# Patient Record
Sex: Female | Born: 1945 | Race: White | Hispanic: No | Smoking: Former smoker
Health system: Southern US, Community
[De-identification: ages and names within clinical notes are randomized; demographics above are authoritative.]

## PROBLEM LIST (undated history)

## (undated) DIAGNOSIS — H269 Unspecified cataract: Secondary | ICD-10-CM

## (undated) DIAGNOSIS — C4490 Unspecified malignant neoplasm of skin, unspecified: Secondary | ICD-10-CM

## (undated) DIAGNOSIS — K5790 Diverticulosis of intestine, part unspecified, without perforation or abscess without bleeding: Secondary | ICD-10-CM

## (undated) DIAGNOSIS — G4730 Sleep apnea, unspecified: Secondary | ICD-10-CM

## (undated) DIAGNOSIS — I4891 Unspecified atrial fibrillation: Secondary | ICD-10-CM

## (undated) DIAGNOSIS — Z972 Presence of dental prosthetic device (complete) (partial): Secondary | ICD-10-CM

## (undated) DIAGNOSIS — R0600 Dyspnea, unspecified: Secondary | ICD-10-CM

## (undated) DIAGNOSIS — IMO0002 Reserved for concepts with insufficient information to code with codable children: Secondary | ICD-10-CM

## (undated) DIAGNOSIS — I639 Cerebral infarction, unspecified: Secondary | ICD-10-CM

## (undated) DIAGNOSIS — E785 Hyperlipidemia, unspecified: Secondary | ICD-10-CM

## (undated) DIAGNOSIS — E039 Hypothyroidism, unspecified: Secondary | ICD-10-CM

## (undated) DIAGNOSIS — D689 Coagulation defect, unspecified: Secondary | ICD-10-CM

## (undated) DIAGNOSIS — K759 Inflammatory liver disease, unspecified: Secondary | ICD-10-CM

## (undated) DIAGNOSIS — I499 Cardiac arrhythmia, unspecified: Secondary | ICD-10-CM

## (undated) DIAGNOSIS — K219 Gastro-esophageal reflux disease without esophagitis: Secondary | ICD-10-CM

## (undated) DIAGNOSIS — M8580 Other specified disorders of bone density and structure, unspecified site: Secondary | ICD-10-CM

## (undated) DIAGNOSIS — I4892 Unspecified atrial flutter: Secondary | ICD-10-CM

## (undated) HISTORY — DX: Gastro-esophageal reflux disease without esophagitis: K21.9

## (undated) HISTORY — DX: Reserved for concepts with insufficient information to code with codable children: IMO0002

## (undated) HISTORY — DX: Coagulation defect, unspecified: D68.9

## (undated) HISTORY — DX: Hyperlipidemia, unspecified: E78.5

## (undated) HISTORY — DX: Other specified disorders of bone density and structure, unspecified site: M85.80

## (undated) HISTORY — DX: Unspecified cataract: H26.9

## (undated) HISTORY — PX: ANAL FISSURE REPAIR: SHX2312

## (undated) HISTORY — DX: Unspecified malignant neoplasm of skin, unspecified: C44.90

## (undated) HISTORY — DX: Diverticulosis of intestine, part unspecified, without perforation or abscess without bleeding: K57.90

## (undated) HISTORY — PX: ABDOMINAL HYSTERECTOMY: SHX81

## (undated) HISTORY — DX: Unspecified atrial flutter: I48.92

## (undated) HISTORY — DX: Hypothyroidism, unspecified: E03.9

## (undated) HISTORY — DX: Cerebral infarction, unspecified: I63.9

## (undated) HISTORY — PX: PARTIAL HYSTERECTOMY: SHX80

## (undated) HISTORY — DX: Unspecified atrial fibrillation: I48.91

---

## 1982-06-19 DIAGNOSIS — E039 Hypothyroidism, unspecified: Secondary | ICD-10-CM

## 1982-06-19 HISTORY — DX: Hypothyroidism, unspecified: E03.9

## 1995-06-20 DIAGNOSIS — K219 Gastro-esophageal reflux disease without esophagitis: Secondary | ICD-10-CM

## 1995-06-20 HISTORY — DX: Gastro-esophageal reflux disease without esophagitis: K21.9

## 1996-08-17 DIAGNOSIS — E785 Hyperlipidemia, unspecified: Secondary | ICD-10-CM

## 1996-08-17 HISTORY — DX: Hyperlipidemia, unspecified: E78.5

## 1997-03-19 DIAGNOSIS — K5790 Diverticulosis of intestine, part unspecified, without perforation or abscess without bleeding: Secondary | ICD-10-CM

## 1997-03-19 DIAGNOSIS — K620 Anal polyp: Secondary | ICD-10-CM | POA: Insufficient documentation

## 1997-03-19 DIAGNOSIS — K621 Rectal polyp: Secondary | ICD-10-CM

## 1997-03-19 HISTORY — DX: Diverticulosis of intestine, part unspecified, without perforation or abscess without bleeding: K57.90

## 1997-04-06 HISTORY — PX: POLYPECTOMY: SHX149

## 1997-09-14 ENCOUNTER — Other Ambulatory Visit: Admission: RE | Admit: 1997-09-14 | Discharge: 1997-09-14 | Payer: Self-pay | Admitting: Family Medicine

## 1999-11-23 ENCOUNTER — Encounter: Payer: Self-pay | Admitting: Family Medicine

## 1999-11-23 ENCOUNTER — Encounter: Admission: RE | Admit: 1999-11-23 | Discharge: 1999-11-23 | Payer: Self-pay | Admitting: Family Medicine

## 2000-10-05 ENCOUNTER — Other Ambulatory Visit: Admission: RE | Admit: 2000-10-05 | Discharge: 2000-10-05 | Payer: Self-pay | Admitting: Family Medicine

## 2000-10-11 ENCOUNTER — Encounter: Payer: Self-pay | Admitting: Family Medicine

## 2000-10-11 LAB — CONVERTED CEMR LAB: Pap Smear: NORMAL

## 2000-11-19 ENCOUNTER — Encounter: Admission: RE | Admit: 2000-11-19 | Discharge: 2000-11-19 | Payer: Self-pay | Admitting: Family Medicine

## 2000-12-04 ENCOUNTER — Encounter: Payer: Self-pay | Admitting: Family Medicine

## 2000-12-04 ENCOUNTER — Encounter: Admission: RE | Admit: 2000-12-04 | Discharge: 2000-12-04 | Payer: Self-pay | Admitting: Family Medicine

## 2001-12-10 ENCOUNTER — Encounter: Admission: RE | Admit: 2001-12-10 | Discharge: 2001-12-10 | Payer: Self-pay | Admitting: Family Medicine

## 2001-12-10 ENCOUNTER — Encounter: Payer: Self-pay | Admitting: Family Medicine

## 2002-11-14 LAB — HM DEXA SCAN

## 2002-11-18 DIAGNOSIS — M8580 Other specified disorders of bone density and structure, unspecified site: Secondary | ICD-10-CM

## 2002-11-18 HISTORY — DX: Other specified disorders of bone density and structure, unspecified site: M85.80

## 2002-12-12 ENCOUNTER — Encounter: Payer: Self-pay | Admitting: Family Medicine

## 2002-12-12 ENCOUNTER — Encounter: Admission: RE | Admit: 2002-12-12 | Discharge: 2002-12-12 | Payer: Self-pay | Admitting: Family Medicine

## 2004-01-20 ENCOUNTER — Encounter: Admission: RE | Admit: 2004-01-20 | Discharge: 2004-01-20 | Payer: Self-pay | Admitting: Family Medicine

## 2004-04-22 ENCOUNTER — Ambulatory Visit: Payer: Self-pay | Admitting: Family Medicine

## 2004-05-30 ENCOUNTER — Ambulatory Visit: Payer: Self-pay | Admitting: Family Medicine

## 2004-06-08 ENCOUNTER — Ambulatory Visit: Payer: Self-pay | Admitting: Family Medicine

## 2004-08-02 ENCOUNTER — Ambulatory Visit: Payer: Self-pay | Admitting: Family Medicine

## 2005-01-18 ENCOUNTER — Ambulatory Visit: Payer: Self-pay | Admitting: Family Medicine

## 2005-01-20 ENCOUNTER — Ambulatory Visit: Payer: Self-pay | Admitting: Family Medicine

## 2005-02-13 ENCOUNTER — Ambulatory Visit: Payer: Self-pay | Admitting: Family Medicine

## 2005-02-23 ENCOUNTER — Encounter: Admission: RE | Admit: 2005-02-23 | Discharge: 2005-02-23 | Payer: Self-pay | Admitting: Family Medicine

## 2005-10-09 ENCOUNTER — Ambulatory Visit: Payer: Self-pay | Admitting: Family Medicine

## 2005-10-19 ENCOUNTER — Ambulatory Visit: Payer: Self-pay | Admitting: Family Medicine

## 2005-12-11 ENCOUNTER — Ambulatory Visit: Payer: Self-pay | Admitting: Family Medicine

## 2005-12-12 ENCOUNTER — Ambulatory Visit: Payer: Self-pay | Admitting: Family Medicine

## 2005-12-13 ENCOUNTER — Ambulatory Visit: Payer: Self-pay | Admitting: Family Medicine

## 2005-12-14 ENCOUNTER — Ambulatory Visit: Payer: Self-pay | Admitting: Family Medicine

## 2005-12-15 ENCOUNTER — Ambulatory Visit: Payer: Self-pay | Admitting: Family Medicine

## 2005-12-18 ENCOUNTER — Ambulatory Visit: Payer: Self-pay | Admitting: Family Medicine

## 2005-12-27 ENCOUNTER — Ambulatory Visit: Payer: Self-pay | Admitting: Family Medicine

## 2006-03-13 ENCOUNTER — Ambulatory Visit: Payer: Self-pay | Admitting: Internal Medicine

## 2006-04-02 ENCOUNTER — Ambulatory Visit: Payer: Self-pay | Admitting: Family Medicine

## 2006-04-04 ENCOUNTER — Ambulatory Visit: Payer: Self-pay | Admitting: Family Medicine

## 2006-04-19 LAB — FECAL OCCULT BLOOD, GUAIAC: Fecal Occult Blood: POSITIVE

## 2006-05-08 ENCOUNTER — Ambulatory Visit: Payer: Self-pay | Admitting: Family Medicine

## 2006-05-16 ENCOUNTER — Ambulatory Visit: Payer: Self-pay | Admitting: Family Medicine

## 2006-06-01 ENCOUNTER — Ambulatory Visit: Payer: Self-pay | Admitting: Family Medicine

## 2006-06-04 ENCOUNTER — Ambulatory Visit: Payer: Self-pay | Admitting: Family Medicine

## 2006-06-05 ENCOUNTER — Ambulatory Visit: Payer: Self-pay | Admitting: Internal Medicine

## 2006-06-21 ENCOUNTER — Ambulatory Visit: Payer: Self-pay | Admitting: Family Medicine

## 2006-07-05 ENCOUNTER — Ambulatory Visit: Payer: Self-pay | Admitting: Internal Medicine

## 2006-07-05 LAB — HM COLONOSCOPY

## 2006-07-20 HISTORY — PX: CHOLECYSTECTOMY: SHX55

## 2006-08-03 ENCOUNTER — Ambulatory Visit (HOSPITAL_COMMUNITY): Admission: RE | Admit: 2006-08-03 | Discharge: 2006-08-04 | Payer: Self-pay | Admitting: General Surgery

## 2006-08-03 ENCOUNTER — Encounter (INDEPENDENT_AMBULATORY_CARE_PROVIDER_SITE_OTHER): Payer: Self-pay | Admitting: *Deleted

## 2006-09-07 ENCOUNTER — Encounter: Payer: Self-pay | Admitting: Family Medicine

## 2006-11-30 ENCOUNTER — Encounter (INDEPENDENT_AMBULATORY_CARE_PROVIDER_SITE_OTHER): Payer: Self-pay | Admitting: *Deleted

## 2006-12-04 ENCOUNTER — Ambulatory Visit: Payer: Self-pay | Admitting: Family Medicine

## 2006-12-05 ENCOUNTER — Encounter: Payer: Self-pay | Admitting: Family Medicine

## 2006-12-05 HISTORY — PX: MOHS SURGERY: SUR867

## 2006-12-15 DIAGNOSIS — K219 Gastro-esophageal reflux disease without esophagitis: Secondary | ICD-10-CM | POA: Insufficient documentation

## 2006-12-15 DIAGNOSIS — M8580 Other specified disorders of bone density and structure, unspecified site: Secondary | ICD-10-CM | POA: Insufficient documentation

## 2006-12-15 DIAGNOSIS — E785 Hyperlipidemia, unspecified: Secondary | ICD-10-CM

## 2006-12-15 DIAGNOSIS — K5730 Diverticulosis of large intestine without perforation or abscess without bleeding: Secondary | ICD-10-CM | POA: Insufficient documentation

## 2006-12-15 DIAGNOSIS — G43909 Migraine, unspecified, not intractable, without status migrainosus: Secondary | ICD-10-CM | POA: Insufficient documentation

## 2006-12-15 DIAGNOSIS — E039 Hypothyroidism, unspecified: Secondary | ICD-10-CM

## 2006-12-15 DIAGNOSIS — H409 Unspecified glaucoma: Secondary | ICD-10-CM | POA: Insufficient documentation

## 2006-12-15 DIAGNOSIS — M810 Age-related osteoporosis without current pathological fracture: Secondary | ICD-10-CM

## 2007-02-02 ENCOUNTER — Encounter: Payer: Self-pay | Admitting: Family Medicine

## 2007-02-02 ENCOUNTER — Ambulatory Visit: Payer: Self-pay | Admitting: Family Medicine

## 2007-02-04 ENCOUNTER — Ambulatory Visit: Payer: Self-pay | Admitting: Family Medicine

## 2007-02-05 ENCOUNTER — Encounter: Payer: Self-pay | Admitting: Family Medicine

## 2007-02-06 ENCOUNTER — Telehealth (INDEPENDENT_AMBULATORY_CARE_PROVIDER_SITE_OTHER): Payer: Self-pay | Admitting: *Deleted

## 2007-04-03 ENCOUNTER — Ambulatory Visit: Payer: Self-pay | Admitting: Family Medicine

## 2007-04-03 LAB — CONVERTED CEMR LAB
ALT: 22 units/L (ref 0–35)
AST: 23 units/L (ref 0–37)
BUN: 6 mg/dL (ref 6–23)
Basophils Absolute: 0 10*3/uL (ref 0.0–0.1)
Basophils Relative: 0.7 % (ref 0.0–1.0)
CO2: 32 meq/L (ref 19–32)
Calcium: 9.2 mg/dL (ref 8.4–10.5)
Chloride: 105 meq/L (ref 96–112)
Cholesterol: 172 mg/dL (ref 0–200)
Creatinine, Ser: 0.6 mg/dL (ref 0.4–1.2)
Direct LDL: 104.8 mg/dL
Eosinophils Absolute: 0.2 10*3/uL (ref 0.0–0.6)
Eosinophils Relative: 4.3 % (ref 0.0–5.0)
Free T4: 0.9 ng/dL (ref 0.6–1.6)
GFR calc Af Amer: 131 mL/min
GFR calc non Af Amer: 108 mL/min
Glucose, Bld: 97 mg/dL (ref 70–99)
HCT: 39.8 % (ref 36.0–46.0)
HDL: 29.6 mg/dL — ABNORMAL LOW (ref >39.0)
Hemoglobin: 13.7 g/dL (ref 12.0–15.0)
Lymphocytes Relative: 41.7 % (ref 12.0–46.0)
MCHC: 34.4 g/dL (ref 30.0–36.0)
MCV: 83.6 fL (ref 78.0–100.0)
Monocytes Absolute: 0.4 10*3/uL (ref 0.2–0.7)
Monocytes Relative: 7.9 % (ref 3.0–11.0)
Neutro Abs: 2.1 10*3/uL (ref 1.4–7.7)
Neutrophils Relative %: 45.4 % (ref 43.0–77.0)
Platelets: 167 10*3/uL (ref 150–400)
Potassium: 4 meq/L (ref 3.5–5.1)
RBC: 4.76 M/uL (ref 3.87–5.11)
RDW: 13.5 % (ref 11.5–14.6)
Sodium: 142 meq/L (ref 135–145)
TSH: 6.75 microintl units/mL — ABNORMAL HIGH (ref 0.35–5.50)
Total CHOL/HDL Ratio: 5.8
Triglycerides: 226 mg/dL (ref 0–149)
VLDL: 45 mg/dL — ABNORMAL HIGH (ref 0–40)
WBC: 4.7 10*3/uL (ref 4.5–10.5)

## 2007-04-08 ENCOUNTER — Ambulatory Visit: Payer: Self-pay | Admitting: Family Medicine

## 2007-05-02 ENCOUNTER — Ambulatory Visit: Payer: Self-pay | Admitting: Family Medicine

## 2007-05-14 ENCOUNTER — Encounter: Payer: Self-pay | Admitting: Family Medicine

## 2007-05-14 ENCOUNTER — Ambulatory Visit: Payer: Self-pay | Admitting: Family Medicine

## 2007-05-20 ENCOUNTER — Encounter (INDEPENDENT_AMBULATORY_CARE_PROVIDER_SITE_OTHER): Payer: Self-pay | Admitting: *Deleted

## 2007-05-21 ENCOUNTER — Telehealth: Payer: Self-pay | Admitting: Family Medicine

## 2007-06-21 ENCOUNTER — Telehealth (INDEPENDENT_AMBULATORY_CARE_PROVIDER_SITE_OTHER): Payer: Self-pay | Admitting: *Deleted

## 2007-08-02 ENCOUNTER — Encounter: Payer: Self-pay | Admitting: Family Medicine

## 2007-08-02 HISTORY — PX: OTHER SURGICAL HISTORY: SHX169

## 2007-10-03 ENCOUNTER — Ambulatory Visit: Payer: Self-pay | Admitting: Family Medicine

## 2007-10-23 ENCOUNTER — Telehealth: Payer: Self-pay | Admitting: Family Medicine

## 2008-02-17 ENCOUNTER — Telehealth (INDEPENDENT_AMBULATORY_CARE_PROVIDER_SITE_OTHER): Payer: Self-pay | Admitting: Internal Medicine

## 2008-03-18 ENCOUNTER — Telehealth: Payer: Self-pay | Admitting: Family Medicine

## 2008-05-07 ENCOUNTER — Ambulatory Visit: Payer: Self-pay | Admitting: Family Medicine

## 2008-05-07 LAB — CONVERTED CEMR LAB
ALT: 22 units/L (ref 0–35)
Albumin: 3.6 g/dL (ref 3.5–5.2)
BUN: 11 mg/dL (ref 6–23)
Basophils Relative: 1 % (ref 0.0–3.0)
Bilirubin, Direct: 0.1 mg/dL (ref 0.0–0.3)
CO2: 29 meq/L (ref 19–32)
Calcium: 9.5 mg/dL (ref 8.4–10.5)
Creatinine, Ser: 0.7 mg/dL (ref 0.4–1.2)
Eosinophils Relative: 4 % (ref 0.0–5.0)
GFR calc Af Amer: 109 mL/min
Glucose, Bld: 104 mg/dL — ABNORMAL HIGH (ref 70–99)
HCT: 41.9 % (ref 36.0–46.0)
Hemoglobin: 14.3 g/dL (ref 12.0–15.0)
LDL Cholesterol: 103 mg/dL — ABNORMAL HIGH (ref 0–99)
Lymphocytes Relative: 36.5 % (ref 12.0–46.0)
Monocytes Absolute: 0.5 10*3/uL (ref 0.1–1.0)
Monocytes Relative: 8.5 % (ref 3.0–12.0)
Neutro Abs: 2.9 10*3/uL (ref 1.4–7.7)
RBC: 4.85 M/uL (ref 3.87–5.11)
RDW: 12.9 % (ref 11.5–14.6)
TSH: 1.59 microintl units/mL (ref 0.35–5.50)
Total CHOL/HDL Ratio: 4.4
Total Protein: 6.5 g/dL (ref 6.0–8.3)
Triglycerides: 180 mg/dL — ABNORMAL HIGH (ref 0–149)
WBC: 5.9 10*3/uL (ref 4.5–10.5)

## 2008-05-09 LAB — CONVERTED CEMR LAB: Vit D, 1,25-Dihydroxy: 7 — ABNORMAL LOW (ref 30–89)

## 2008-05-12 ENCOUNTER — Ambulatory Visit: Payer: Self-pay | Admitting: Family Medicine

## 2008-07-02 ENCOUNTER — Telehealth: Payer: Self-pay | Admitting: Family Medicine

## 2008-08-05 ENCOUNTER — Ambulatory Visit: Payer: Self-pay | Admitting: Family Medicine

## 2008-08-10 ENCOUNTER — Ambulatory Visit: Payer: Self-pay | Admitting: Family Medicine

## 2008-08-10 DIAGNOSIS — I8390 Asymptomatic varicose veins of unspecified lower extremity: Secondary | ICD-10-CM | POA: Insufficient documentation

## 2008-08-19 ENCOUNTER — Ambulatory Visit: Payer: Self-pay | Admitting: Family Medicine

## 2008-08-19 ENCOUNTER — Encounter: Payer: Self-pay | Admitting: Family Medicine

## 2008-08-24 ENCOUNTER — Encounter (INDEPENDENT_AMBULATORY_CARE_PROVIDER_SITE_OTHER): Payer: Self-pay | Admitting: *Deleted

## 2008-08-24 ENCOUNTER — Encounter: Payer: Self-pay | Admitting: Family Medicine

## 2008-09-04 ENCOUNTER — Encounter: Payer: Self-pay | Admitting: Family Medicine

## 2008-09-17 HISTORY — PX: OTHER SURGICAL HISTORY: SHX169

## 2008-10-07 ENCOUNTER — Ambulatory Visit: Payer: Self-pay | Admitting: Family Medicine

## 2008-10-12 ENCOUNTER — Ambulatory Visit: Payer: Self-pay | Admitting: Family Medicine

## 2008-10-16 ENCOUNTER — Encounter: Payer: Self-pay | Admitting: Family Medicine

## 2008-12-14 ENCOUNTER — Telehealth: Payer: Self-pay | Admitting: Family Medicine

## 2008-12-23 ENCOUNTER — Telehealth: Payer: Self-pay | Admitting: Family Medicine

## 2009-03-22 ENCOUNTER — Telehealth: Payer: Self-pay | Admitting: Family Medicine

## 2009-04-07 ENCOUNTER — Ambulatory Visit: Payer: Self-pay | Admitting: Family Medicine

## 2009-04-07 LAB — CONVERTED CEMR LAB
Bilirubin Urine: NEGATIVE
Epithelial cells, urine: 0 /lpf
Nitrite: NEGATIVE
RBC / HPF: 0
Specific Gravity, Urine: >=1.03
WBC Urine, dipstick: NEGATIVE

## 2009-05-17 ENCOUNTER — Ambulatory Visit: Payer: Self-pay | Admitting: Family Medicine

## 2009-05-19 ENCOUNTER — Other Ambulatory Visit: Admission: RE | Admit: 2009-05-19 | Discharge: 2009-05-19 | Payer: Self-pay | Admitting: Family Medicine

## 2009-05-19 ENCOUNTER — Ambulatory Visit: Payer: Self-pay | Admitting: Family Medicine

## 2009-05-19 LAB — CONVERTED CEMR LAB: Pap Smear: NORMAL

## 2009-05-27 ENCOUNTER — Encounter (INDEPENDENT_AMBULATORY_CARE_PROVIDER_SITE_OTHER): Payer: Self-pay | Admitting: *Deleted

## 2009-05-27 ENCOUNTER — Encounter: Payer: Self-pay | Admitting: Family Medicine

## 2009-07-23 ENCOUNTER — Telehealth: Payer: Self-pay | Admitting: Family Medicine

## 2009-07-26 ENCOUNTER — Ambulatory Visit: Payer: Self-pay | Admitting: Family Medicine

## 2009-07-29 ENCOUNTER — Ambulatory Visit: Payer: Self-pay | Admitting: Family Medicine

## 2009-08-02 ENCOUNTER — Telehealth: Payer: Self-pay | Admitting: Family Medicine

## 2009-08-03 ENCOUNTER — Encounter: Payer: Self-pay | Admitting: Family Medicine

## 2009-08-24 ENCOUNTER — Encounter: Payer: Self-pay | Admitting: Family Medicine

## 2009-08-30 ENCOUNTER — Telehealth: Payer: Self-pay | Admitting: Family Medicine

## 2009-09-06 ENCOUNTER — Ambulatory Visit: Payer: Self-pay | Admitting: Family Medicine

## 2009-09-07 LAB — CONVERTED CEMR LAB: Vit D, 25-Hydroxy: 30 ng/mL (ref 30–89)

## 2009-09-15 ENCOUNTER — Ambulatory Visit: Payer: Self-pay | Admitting: Family Medicine

## 2009-10-14 ENCOUNTER — Ambulatory Visit: Payer: Self-pay | Admitting: Family Medicine

## 2009-10-15 ENCOUNTER — Ambulatory Visit: Payer: Self-pay | Admitting: Family Medicine

## 2009-10-15 ENCOUNTER — Encounter: Payer: Self-pay | Admitting: Family Medicine

## 2009-10-15 LAB — HM MAMMOGRAPHY: HM Mammogram: NORMAL

## 2009-10-25 ENCOUNTER — Encounter (INDEPENDENT_AMBULATORY_CARE_PROVIDER_SITE_OTHER): Payer: Self-pay | Admitting: *Deleted

## 2010-01-25 ENCOUNTER — Encounter (INDEPENDENT_AMBULATORY_CARE_PROVIDER_SITE_OTHER): Payer: Self-pay | Admitting: *Deleted

## 2010-04-13 ENCOUNTER — Ambulatory Visit: Payer: Self-pay | Admitting: Family Medicine

## 2010-06-07 ENCOUNTER — Ambulatory Visit: Payer: Self-pay | Admitting: Family Medicine

## 2010-06-07 ENCOUNTER — Encounter: Payer: Self-pay | Admitting: Family Medicine

## 2010-06-07 LAB — CONVERTED CEMR LAB
AST: 25 units/L (ref 0–37)
Albumin: 3.8 g/dL (ref 3.5–5.2)
Alkaline Phosphatase: 70 units/L (ref 39–117)
BUN: 15 mg/dL (ref 6–23)
Basophils Relative: 0.8 % (ref 0.0–3.0)
CO2: 31 meq/L (ref 19–32)
Calcium: 9.2 mg/dL (ref 8.4–10.5)
Eosinophils Relative: 2.6 % (ref 0.0–5.0)
GFR calc non Af Amer: 86.72 mL/min (ref >60.00)
Glucose, Bld: 92 mg/dL (ref 70–99)
HCT: 39.5 % (ref 36.0–46.0)
Hemoglobin: 13.4 g/dL (ref 12.0–15.0)
Lymphs Abs: 2.1 10*3/uL (ref 0.7–4.0)
MCV: 85.2 fL (ref 78.0–100.0)
Monocytes Absolute: 0.4 10*3/uL (ref 0.1–1.0)
Monocytes Relative: 8.2 % (ref 3.0–12.0)
RBC: 4.63 M/uL (ref 3.87–5.11)
TSH: 20.05 microintl units/mL — ABNORMAL HIGH (ref 0.35–5.50)
Total CHOL/HDL Ratio: 5
Total Protein: 6.5 g/dL (ref 6.0–8.3)
WBC: 5.4 10*3/uL (ref 4.5–10.5)

## 2010-06-15 ENCOUNTER — Ambulatory Visit: Payer: Self-pay | Admitting: Family Medicine

## 2010-07-17 LAB — CONVERTED CEMR LAB
ALT: 21 units/L (ref 0–35)
AST: 23 units/L (ref 0–37)
Albumin: 3.7 g/dL (ref 3.5–5.2)
Calcium: 9.1 mg/dL (ref 8.4–10.5)
Chloride: 103 meq/L (ref 96–112)
Cholesterol: 199 mg/dL (ref 0–200)
Creatinine, Ser: 0.9 mg/dL (ref 0.4–1.2)
Direct LDL: 125.3 mg/dL
Eosinophils Relative: 2.9 % (ref 0.0–5.0)
Free T4: 1.2 ng/dL (ref 0.6–1.6)
HDL: 37.7 mg/dL — ABNORMAL LOW (ref >39.00)
Lymphocytes Relative: 37 % (ref 12.0–46.0)
Monocytes Absolute: 0.5 10*3/uL (ref 0.1–1.0)
Monocytes Relative: 8.8 % (ref 3.0–12.0)
Neutrophils Relative %: 50.3 % (ref 43.0–77.0)
Platelets: 165 10*3/uL (ref 150.0–400.0)
RBC: 4.79 M/uL (ref 3.87–5.11)
Sodium: 140 meq/L (ref 135–145)
TSH: 0.68 microintl units/mL (ref 0.35–5.50)
Total Protein: 6.9 g/dL (ref 6.0–8.3)
Triglycerides: 227 mg/dL — ABNORMAL HIGH (ref 0.0–149.0)
Vit D, 25-Hydroxy: 15 ng/mL — ABNORMAL LOW (ref 30–89)
WBC: 5.9 10*3/uL (ref 4.5–10.5)

## 2010-07-19 NOTE — Assessment & Plan Note (Signed)
Summary: FLU SHOT/CLE   Nurse Visit   Allergies: 1)  Celebrex (Celecoxib)  Orders Added: 1)  Admin 1st Vaccine [90471] 2)  Flu Vaccine 57yrs + [27253]  Flu Vaccine Consent Questions     Do you have a history of severe allergic reactions to this vaccine? no    Any prior history of allergic reactions to egg and/or gelatin? no    Do you have a sensitivity to the preservative Thimersol? no    Do you have a past history of Guillan-Barre Syndrome? no    Do you currently have an acute febrile illness? no    Have you ever had a severe reaction to latex? no    Vaccine information given and explained to patient? yes    Are you currently pregnant? no    Lot Number:[id number]   Exp Date:12/17/2010   Site Given  Left Deltoid IM1

## 2010-07-19 NOTE — Letter (Signed)
Summary: Kara Wright letter  [place] at [facility]  [address] [place], [place] [address]   Phone: [phone]  Fax: [phone]       01/25/2010 MRN: [id number]  [facility] [address] [place], [place]  [address]  Dear Ms. [facility] [place] and [place] announce the retirement of Arta Silence, M.D., from full-time practice at the [facility] office effective December 16, 2009 and his plans of returning part-time.  It is important to Dr. Hetty Ely and to our practice that you understand that [facility] has seven physicians in our office for your health care needs.  We will continue to offer the same exceptional care that you have today.    Dr. Hetty Ely has spoken to many of you about his plans for retirement and returning part-time in the fall.   We will continue to work with you through the transition to schedule appointments for you in the office and meet the high standards that [place] is committed to.   Again, it is with great pleasure that we share the news that Dr. Hetty Ely will return to [facility] in October of 2011 with a reduced schedule.    If you have any questions, or would like to request an appointment with one of our physicians, please call us at [phone] and press the option for Scheduling an appointment.  We take pleasure in providing you with excellent patient care and look forward to seeing you at your next office visit.  Our [facility] Physicians are:  Tillman Abide, M.D. Laurita Quint, M.D. Roxy Manns, M.D. Kerby Nora, M.D. Hannah Beat, M.D. Ruthe Mannan, M.D. We proudly welcomed Raechel Ache, M.D. and Eustaquio Boyden, M.D. to the practice in July/August 2011.  Sincerely,  [place] [facility]

## 2010-07-19 NOTE — Consult Note (Signed)
Summary: Dr.P.Scott Trisha [facility]angle [facility]ro[facility],Note  Dr.P.Scott Trisha [facility]angle [facility] Thro[facility],Note   Imported By: Beau Fanny 08/13/2009 16:36:41  _____________________________________________________________________  External Attachment:    Type:   Image     Comment:   External Document

## 2010-07-19 NOTE — Progress Notes (Signed)
Summary: ear still hurting  Phone Note Call from Patient Call back at Home Phone [phone]   Caller: Patient Call For: Shaune Leeks MD Summary of Call: Pt was seen twice last week for virus, flu, ear infection.  Pt states her left ear is still hurting and she can hardly hear.  Please advise.  She is worried that the antibiotic isnt working.   Uses [facility]. Initial call taken by: Lowella Petties CMA,  August 02, 2009 12:36 PM  Follow-up for Phone Call        Will refer to ENT and they will probably do myringotomy...put small hole in the eardrum. Follow-up by: Shaune Leeks MD,  August 02, 2009 1:46 PM  Additional Follow-up for Phone Call Additional follow up Details #1::        Appt made with Dr Willeen Cass on 08/03/2009 at 9:15am. Notes faxed.  Additional Follow-up by: Carlton Adam,  August 03, 2009 8:15 AM

## 2010-07-19 NOTE — Assessment & Plan Note (Signed)
Summary: EARS/CLE   Vital Signs:  Patient profile:   65 year old female Weight:      222.75 pounds Temp:     97.9 degrees F oral Pulse rate:   76 / minute Pulse rhythm:   regular BP sitting:   118 / 80  (left arm) Cuff size:   large  Vitals Entered By: Sydell Axon LPN (July 29, 2009 12:03 PM) CC: Ears hurt and has a ringing in ears   History of Present Illness: Pt seen a few days ago for sxs that seemed t obe flu but too long since starting for medication to be prescribed for effectiveness. She was treated symptomatically. Sheis still slightly nauseaqted but hasn't vomitted today. She is eatingnormal foods but not as much as usual. Her ears are hurting and ringing all the time like crickets.  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 1 By Mouth Daily 2)  Propranolol Hcl Cr 80 Mg  Cp24 (Propranolol Hcl) .Marland Kitchen.. 1 By Mouth Daily 3)  Vitamin D 29562 Unit Caps (Ergocalciferol) .... One Tab By Mouth Twice A  Week. 4)  Grape Seed 25-50 Mg Caps (Grape Seed) .... Take One By Mouth Daily 5)  Elocon 0.1 % Crea (Mometasone Furoate) .... Apply To Area Two Times A Day As Needed Eczema. 6)  Tessalon 200 Mg Caps (Benzonatate) .... One Tab By Mouth Three Times A Day As Needed Cough 7)  Promethazine Hcl 25 Mg  Tabs (Promethazine Hcl) .... One Tab By Mouth Every 6 Hrs As Needed Nausea 8)  Promethazine Hcl 25 Mg Supp (Promethazine Hcl) .... One Supp Per Rectum Every 6 Hrs As Needed Nausea  Allergies: 1)  Celebrex (Celecoxib)  Physical Exam  General:  Well-developed,well-nourished,in no acute distress; alert,appropriate and cooperative throughout examination, obese. Lightheaded. at times changing position, nontoxic but looks tired.. Head:  Normocephalic and atraumatic without obvious abnormalities. No apparent alopecia or balding. Sinuses NT. Eyes:  Conjunctiva clear bilaterally.  Ears:  External ear exam shows no significant lesions or deformities.  Otoscopic examination reveals clear canals, tympanic membranes are intact bilaterally but with significant bulging and erythema with inflammation in the upper posterior quadrants of the TM bilat. Hearing is grossly normal bilaterally but hears crickets.. Nose:  Mildly inflamed with clear discharge. Mouth:  Oral mucosa and oropharynx without lesions or exudates.  Teeth in good repair. Neck:  No deformities, masses, or tenderness noted. Lungs:  Normal respiratory effort, chest expands symmetrically. Lungs are clear to auscultation, no crackles or wheezes. Mild congestion anteriorally centrally. Heart:  Normal rate and regular rhythm. S1 and S2 normal without gallop, murmur, click, rub or other extra sounds.   Impression & Recommendations:  Problem # 1:  OTITIS MEDIA, BILATERAL W/ BULGING (ICD-382.9) Assessment New  Heat to ears, Afrin 3 squirts one minute apart three times  a day for three days. Amox.  Try Vicodin for the pain. If takes more than 2 a day, must subtract one for one of the Tyl. Her updated medication list for this problem includes:    Amoxicillin 500 Mg Caps (Amoxicillin) .Marland Kitchen... 2 tabs by mouth two times a day  Instructed on prevention and treatment. Call if no improvement in 48-72 hours or sooner if worsening symptoms.   Complete  Medication List: 1)  Synthroid 175 Mcg Tabs (Levothyroxine sodium) .Marland Kitchen.. 1 by mouth daily 2)  Propranolol Hcl Cr 80 Mg Cp24 (Propranolol hcl) .Marland Kitchen.. 1 by mouth daily 3)  Vitamin D [address] Unit Caps (Ergocalciferol) .... One tab by mouth twice a  week. 4)  Grape Seed 25-50 Mg Caps (Grape seed) .... Take one by mouth daily 5)  Elocon 0.1 % Crea (Mometasone furoate) .... Apply to area two times a day as needed eczema. 6)  Tessalon 200 Mg Caps (Benzonatate) .... One tab by mouth three times a day as needed cough 7)  Promethazine Hcl 25 Mg Tabs (Promethazine hcl) .... One tab by mouth every 6 hrs as needed nausea 8)  Promethazine Hcl 25 Mg Supp (Promethazine hcl) .... One supp per rectum every 6 hrs as needed nausea 9)  Amoxicillin 500 Mg Caps (Amoxicillin) .... 2 tabs by mouth two times a day 10)  Auralgan Soln (Antipyrine-benzocaine-polycos) .... 2 drops each ear three times a day for pain 11)  Vicodin 5-500 Mg Tabs (Hydrocodone-acetaminophen) .... One- two  tabs by mouth at night for pain  Patient Instructions: 1)  Try Vicodin for the pain, esp at night. Prescriptions: VICODIN 5-500 MG TABS (HYDROCODONE-ACETAMINOPHEN) one- two  tabs by mouth at night for pain  #20 x 0   Entered and Authorized by:   Shaune Leeks MD   Signed by:   Shaune Leeks MD on 07/29/2009   Method used:   Print then Give to Patient   RxID:   [id number] Lyla Son  SOLN (ANTIPYRINE-BENZOCAINE-POLYCOS) 2 drops each ear three times a day for pain  #1 bottle x 0   Entered and Authorized by:   Shaune Leeks MD   Signed by:   Shaune Leeks MD on 07/29/2009   Method used:   Electronically to        [facility] [phone]* (retail)       [address] [place], [place]  [address]       Ph: [phone]       Fax: [phone]   RxID:   [id number] AMOXICILLIN 500 MG CAPS (AMOXICILLIN) 2 tabs by mouth two times a day  #40 x 0   Entered and Authorized by:   Shaune Leeks MD    Signed by:   Shaune Leeks MD on 07/29/2009   Method used:   Electronically to        [facility] [phone]* (retail)       [address] [place], [place]  [address]       Ph: [phone]       Fax: [phone]   RxID:   [id number]   Current Allergies (reviewed today): CELEBREX (CELECOXIB)

## 2010-07-19 NOTE — Letter (Signed)
Summary: Dr.P.Scott [facility],Note  Dr.P.Scott [facility],Note   Imported By: Beau Fanny 09/03/2009 16:30:26  _____________________________________________________________________  External Attachment:    Type:   Image     Comment:   External Document

## 2010-07-19 NOTE — Assessment & Plan Note (Signed)
Summary: ROA 1 MTHS CYD   Vital Signs:  Patient profile:   65 year old female Height:      65 inches Weight:      230.50 pounds BMI:     38.50 Temp:     98.3 degrees F oral Pulse rate:   72 / minute Pulse rhythm:   regular BP sitting:   124 / 72  (left arm) Cuff size:   large  Vitals Entered By: Linde Gillis CMA Duncan Dull) (October 14, 2009 2:49 PM)  History of Present Illness: Pt here for followup of Vit D. Needs to be drawn. However she has a few other things. She has pain in LLQ posteriorally of the lower lung with deep breathing. No cough, no SOB or resp compromise of any kind. No falls, trauma or unusual activities. No fever or chills. Needs refills of meds for [facility]. Needs order for Mammo.   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 1 By Mouth Daily 2)  Propranolol Hcl Cr 80 Mg  Cp24 (Propranolol Hcl) .Marland Kitchen.. 1 By Mouth Daily 3)  Vitamin D 04540 Unit Caps (Ergocalciferol) .... One Tab By Mouth Twice A  Week. 4)  Grape Seed 25-50 Mg Caps (Grape Seed) .... Take One By Mouth Daily 5)  Elocon 0.1 % Crea (Mometasone Furoate) .... Apply To Area Two Times A Day As Needed Eczema.  Allergies: 1)  Celebrex (Celecoxib)  Physical Exam  General:  Well-developed,well-nourished,in no acute distress; alert,appropriate and cooperative  throughout examination, obese. Lightheaded. at times changing position, nontoxic but looks tired.. Head:  Normocephalic and atraumatic without obvious abnormalities. No apparent alopecia or balding. Sinuses NT. Eyes:  Conjunctiva clear bilaterally.  Ears:  External ear exam shows no significant lesions or deformities.  Otoscopic examination reveals clear canals, tympanic membranes are intact bilaterally  with nml landmarks but dull light reflex on the right and distortion and appearance of scarring on the left. Hearing is grossly normal bilaterally but hears crickets, less than previously.. Nose:  Mildly inflamed with clear discharge. Mouth:  Oral mucosa and oropharynx without lesions or exudates.  Teeth in good repair. Neck:  No deformities, masses, or tenderness noted. Chest Wall:  No deformities, masses, or tenderness noted. Lungs:  Normal respiratory effort, chest expands symmetrically. Lungs are clear to auscultation, no crackles or wheezes. Mild congestion anteriorally centrally. No difficulty with deep breathing. Has discomfort approx T9/10 interspace left post axillary line , tender to palpation at the spot focally. Heart:  Normal rate and regular rhythm. S1 and S2 normal without gallop, murmur, click, rub or other extra sounds.   Impression & Recommendations:  Problem # 1:  FLANK PAIN, LEFT UPPER (ICD-789.09) Assessment New  Apeears to be musc starin. Has musc relaxer at home. Start taking andf use Tyl regularlyy. Add heat and ice.  RTC if doeasn't resolve.  Discussed use  of medications, application of heat or cold, and exercises.   Problem # 2:  VITAMIN D DEFICIENCY (ICD-268.9) Assessment: Improved Recheck level today. Orders: Venipuncture ([address]454) T-Vitamin D (25-Hydroxy) [phone])  Problem # 3:  OTHER SCREENING MAMMOGRAM (ICD-V76.12) Assessment: Unchanged Ordered. Orders: Radiology Referral (Radiology)  Problem # 4:  HYPOTHYROIDISM (ICD-244.9) Assessment:  Unchanged Meds reordered. Her updated medication list for this problem includes:    Synthroid 175 Mcg Tabs (Levothyroxine sodium) .Marland Kitchen... 1 by mouth daily  Complete Medication List: 1)  Synthroid 175 Mcg Tabs (Levothyroxine sodium) .Marland Kitchen.. 1 by mouth daily 2)  Propranolol Hcl Cr 80 Mg Cp24 (Propranolol hcl) .Marland Kitchen.. 1 by mouth daily 3)  Vitamin D 29562 Unit Caps (Ergocalciferol) .... One tab by mouth twice a  week. 4)  Elocon 0.1 % Crea (Mometasone furoate) .... Apply to area two times a day as needed eczema.  Patient Instructions: 1)  Refer for Mammo 2)  Vit D today, report via phone tree. 3)  RTC as discussed. Prescriptions: PROPRANOLOL HCL CR 80 MG  CP24 (PROPRANOLOL HCL) 1 by mouth daily  #90 x 4   Entered and Authorized by:   Shaune Leeks MD   Signed by:   Shaune Leeks MD on 10/14/2009   Method used:   Electronically to        [facility]* (mail-order)             ,          Ph: [phone]       Fax: [phone]   RxID:   [id number] SYNTHROID 175 MCG  TABS (LEVOTHYROXINE SODIUM) 1 by mouth daily  #90 x 4   Entered and Authorized by:   Shaune Leeks MD   Signed by:   Shaune Leeks MD on 10/14/2009   Method used:   Electronically to        [facility]* (mail-order)             ,          Ph: [phone]       Fax: [phone]   RxID:   [id number] PROPRANOLOL HCL CR 80 MG  CP24 (PROPRANOLOL HCL) 1 by mouth daily  #90 x 4   Entered and Authorized by:   Shaune Leeks MD   Signed by:   Shaune Leeks MD on 10/14/2009   Method used:   Electronically to        [facility] [phone]* (retail)       [address] [place], [place]  [address]       Ph: [phone]       Fax: [phone]   RxID:   [id number] SYNTHROID 175 MCG  TABS (LEVOTHYROXINE SODIUM) 1 by mouth daily  #90 x 4   Entered and Authorized by:   Shaune Leeks MD   Signed by:   Shaune Leeks MD on 10/14/2009   Method used:    Electronically to        [facility] [phone]* (retail)       [address] [place], [place]  [address]       Ph: [phone]       Fax: [phone]   RxID:   [id number]   Current Allergies (reviewed today): CELEBREX (CELECOXIB)  Appended Document: ROA 1 MTHS CYD

## 2010-07-19 NOTE — Assessment & Plan Note (Signed)
Summary: FOLLOW UP AFTER LABS/NT   Vital Signs:  Patient profile:   65 year old female Weight:      230.75 pounds Temp:     98.2 degrees F oral Pulse rate:   72 / minute Pulse rhythm:   regular BP sitting:   122 / 74  (left arm) Cuff size:   large  Vitals Entered By: Sydell Axon LPN (September 15, 2009 2:41 PM) CC: Follow-up after labs   History of Present Illness: Pt here for followup of Vit D replacement. Had been on 50000Iu weekly from 2/10 until 12/10 with lvl staying at 15mg /dl and then 60454UJ two times a day until 2 weeks ago. She then started 400mg  in her MVI and 400mg  Vit D two times a day. Level done 3/21 was 30mg /dl. She feels well with no complaints. She has also been seeing Dr Willeen Cass for hearing difficulty felt to be serous otitis, most recently put on a steroid taper which has helped. Things there are improved altho not nml.  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 1 By Mouth Daily 2)  Propranolol Hcl Cr 80 Mg  Cp24 (Propranolol Hcl) .Marland Kitchen.. 1 By Mouth Daily 3)  Vitamin D 81191 Unit Caps (Ergocalciferol) .... One Tab By Mouth Twice A  Week. 4)  Grape Seed 25-50 Mg Caps (Grape Seed) .... Take One By Mouth Daily 5)  Elocon 0.1 % Crea (Mometasone Furoate) .... Apply To Area Two Times A Day As Needed  Eczema. 6)  Tessalon 200 Mg Caps (Benzonatate) .... One Tab By Mouth Three Times A Day As Needed Cough 7)  Promethazine Hcl 25 Mg Tabs (Promethazine Hcl) .... One Tab By Mouth Every 6 Hrs As Needed Nausea 8)  Promethazine Hcl 25 Mg Supp (Promethazine Hcl) .... One Supp Per Rectum Every 6 Hrs As Needed Nausea 9)  Amoxicillin 500 Mg Caps (Amoxicillin) .... 2 Tabs By Mouth Two Times A Day 10)  Auralgan  Soln (Antipyrine-Benzocaine-Polycos) .... 2 Drops Each Ear Three Times A Day For Pain 11)  Vicodin 5-500 Mg Tabs (Hydrocodone-Acetaminophen) .... One- Two  Tabs By Mouth At Night For Pain  Allergies: 1)  Celebrex (Celecoxib)  Physical Exam  General:  Well-developed,well-nourished,in no acute distress; alert,appropriate and cooperative throughout examination, obese. Lightheaded. at times changing position, nontoxic but looks tired.. Head:  Normocephalic and atraumatic without obvious abnormalities. No apparent alopecia or balding. Sinuses NT. Eyes:  Conjunctiva clear bilaterally.  Ears:  External ear exam shows no significant lesions or deformities.  Otoscopic examination reveals clear canals, tympanic membranes are intact bilaterally  with nml landmarks but dull light reflex on the right and distortion and appearance of scarring on the left. Hearing is grossly normal bilaterally but hears crickets, less than previously.. Nose:  Mildly inflamed with clear discharge. Mouth:  Oral mucosa and oropharynx without lesions or exudates.  Teeth in good repair. Neck:  No deformities, masses, or tenderness noted. Chest Wall:  No deformities, masses, or tenderness noted. Lungs:  Normal respiratory effort, chest expands symmetrically. Lungs are clear to auscultation, no crackles or wheezes. Mild congestion anteriorally centrally. Heart:  Normal rate and regular rhythm. S1 and S2 normal without gallop, murmur, click, rub or other extra sounds.   Impression & Recommendations:  Problem # 1:  VITAMIN D  DEFICIENCY (ICD-268.9) Assessment Improved Lvl up to 30mg /dl. Will switch to Vit D OTC 400 three times a day and MVI w/ 400. Recheck in 3 mos.  Complete Medication List: 1)  Synthroid 175 Mcg Tabs (Levothyroxine sodium) .Marland Kitchen.. 1 by mouth daily 2)  Propranolol Hcl Cr 80 Mg Cp24 (Propranolol hcl) .Marland Kitchen.. 1 by mouth daily 3)  Vitamin D 30865 Unit Caps (Ergocalciferol) .... One tab by mouth twice a  week. 4)  Grape Seed 25-50 Mg Caps (Grape seed) .... Take one by mouth daily 5)  Elocon 0.1 % Crea (Mometasone furoate) .... Apply to area two times a day as needed eczema.  Patient Instructions: 1)  RTC 3 mos Vit D prior 268.9  Current Allergies (reviewed today): CELEBREX (CELECOXIB)

## 2010-07-19 NOTE — Letter (Signed)
Summary: Results Follow up Letter  [place] at [facility]  [address] [place], [place] [address]   Phone: [phone]  Fax: [phone]    10/25/2009 MRN: [id number]  [facility] [address] [place], [place]  [address]  Dear Ms. Tourangeau,  The following are the results of your recent test(s):  Test         Result    Pap Smear:        Normal _____  Not Normal _____ Comments: ______________________________________________________ Cholesterol: LDL(Bad cholesterol):         Your goal is less than:         HDL (Good cholesterol):       Your goal is more than: Comments:  ______________________________________________________ Mammogram:        Normal _X____  Not Normal _____ Comments: Please repeat in one year.  ___________________________________________________________________ Hemoccult:        Normal _____  Not normal _______ Comments:    _____________________________________________________________________ Other Tests:    We routinely do not discuss normal results over the telephone.  If you desire a copy of the results, or you have any questions about this information we can discuss them at your next office visit.   Sincerely,     Laurita Quint, MD

## 2010-07-19 NOTE — Assessment & Plan Note (Signed)
Summary: COUGH,THROWING UP,FEVER/CLE   Vital Signs:  Patient profile:   65 year old female Weight:      223 pounds BMI:     37.24 O2 Sat:      94 % on Room air Temp:     99.1 degrees F oral Pulse rate:   88 / minute Pulse rhythm:   regular BP sitting:   130 / 86  (left arm) Cuff size:   large  Vitals Entered By: Sydell Axon LPN (July 26, 2009 12:44 PM)  O2 Flow:  Room air CC: Started getting sick a week ago, productive cough/light brown, fever,  nauseated and chills   History of Present Illness: Pt here with older granddaughter for feeling sick for 8 days. She was congested and achey  and then began sneezing with sore throat. Then 2 days ago, started with chills and got nauseated and used phenergan suppository. She still has congestion and ran fever all day yesterday. 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 She has been mildly lightheaded at times. She is urinating fairly regularly.  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 1 By Mouth Daily 2)  Propranolol Hcl Cr 80 Mg  Cp24 (Propranolol Hcl) .Marland Kitchen.. 1 By Mouth Daily 3)  Promethazine Hcl 25 Mg  Tabs (Promethazine Hcl) .Marland Kitchen.. 1 Every 6 Hours As Needed Nausea 4)  Vitamin D [address] Unit Caps (Ergocalciferol) .... One Tab By  Mouth Twice A  Week. 5)  Grape Seed 25-50 Mg Caps (Grape Seed) .... Take One By Mouth Daily 6)  Elocon 0.1 % Crea (Mometasone Furoate) .... Apply To Area Two Times A Day As Needed Eczema.  Allergies: 1)  Celebrex (Celecoxib)  Physical Exam  General:  Well-developed,well-nourished,in no acute distress; alert,appropriate and cooperative throughout examination, obese. Lightheaded. at times changing position. Head:  Normocephalic and atraumatic without obvious abnormalities. No apparent alopecia or balding. Sinuses NT. Eyes:  Conjunctiva clear bilaterally.  Ears:  External ear exam shows no significant lesions or deformities.  Otoscopic examination reveals clear canals, tympanic membranes are intact bilaterally without bulging, retraction, inflammation or discharge. Hearing is grossly normal bilaterally. Nose:  External nasal examination shows no deformity or inflammation. Nasal mucosa are pink and moist without lesions or exudates. Mouth:  Oral mucosa and oropharynx without lesions or exudates.  Teeth in good repair. Neck:  No deformities, masses, or tenderness noted. Chest Wall:  No deformities, masses, or tenderness noted. Lungs:  Normal respiratory effort, chest expands symmetrically. Lungs are clear to auscultation, no crackles or wheezes. Mild congestion anteriorally centrally.   Impression & Recommendations:  Problem # 1:  URI (ICD-465.9) VIRAL SYNDROME Assessment New Prob flu but since last week so not able to use meds. Will treat symptomatically. See instructions. The following medications were removed from the medication  list:    Promethazine Hcl 25 Mg Tabs (Promethazine hcl) .Marland Kitchen... 1 every 6 hours as needed nausea    Mucinex Dm Maximum Strength 60-1200 Mg Xr12h-tab (Dextromethorphan-guaifenesin) .Marland Kitchen... As needed Her updated medication list for this problem includes:    Tessalon 200 Mg Caps (Benzonatate) ..... One tab by mouth three times a day as needed cough    Promethazine Hcl  25 Mg Tabs (Promethazine hcl) ..... One tab by mouth every 6 hrs as needed nausea    Promethazine Hcl 25 Mg Supp (Promethazine hcl) ..... One supp per rectum every 6 hrs as needed nausea  Complete Medication List: 1)  Synthroid 175 Mcg Tabs (Levothyroxine sodium) .Marland Kitchen.. 1 by mouth daily 2)  Propranolol Hcl Cr 80 Mg Cp24 (Propranolol hcl) .Marland Kitchen.. 1 by mouth daily 3)  Vitamin D [address] Unit Caps (Ergocalciferol) .... One tab by mouth twice a  week. 4)  Grape Seed 25-50 Mg Caps (Grape seed) .... Take one by mouth daily 5)  Elocon 0.1 % Crea (Mometasone furoate) .... Apply to area two times a day as needed eczema. 6)  Tessalon 200 Mg Caps (Benzonatate) .... One tab by mouth three times a day as needed cough 7)  Promethazine Hcl 25 Mg Tabs (Promethazine hcl) .... One tab by mouth every 6 hrs as needed nausea 8)  Promethazine Hcl 25 Mg Supp (Promethazine hcl) .... One supp per rectum every 6 hrs as needed nausea  Patient Instructions: 1)  Phenergan every 6 hrs today and first thing in the AM. THen orn.  2)  Clears today, BRAT tomm.  3)  Tyl ES 2 three times a day when able to keep down. 4)  Rest. Prescriptions: PROMETHAZINE HCL 25 MG SUPP (PROMETHAZINE HCL) one supp per rectum every 6 hrs as needed nausea  #5 x 1   Entered and Authorized by:   Shaune Leeks MD   Signed by:   Shaune Leeks MD on 07/26/2009   Method used:   Electronically to        [facility] [phone]* (retail)       [address] [place], [place]  [address]       Ph: [phone]       Fax: [phone]   RxID:   [id number] PROMETHAZINE HCL 25 MG TABS (PROMETHAZINE HCL) one tab by mouth every 6 hrs as needed nausea  #20 x 0   Entered and Authorized by:   Shaune Leeks MD   Signed by:   Shaune Leeks MD on 07/26/2009   Method used:   Electronically to        [facility] [phone]* (retail)       [address] [place], [place]  [address]       Ph: [phone]       Fax:  [phone]   RxID:   [id number] TESSALON 200 MG CAPS (BENZONATATE) one tab by mouth three times a day as needed cough  #30 x 0   Entered and Authorized by:   Shaune Leeks MD   Signed by:   Shaune Leeks MD on 07/26/2009   Method used:   Print then Give to Patient   RxID:   [id number]   Current Allergies (reviewed today): CELEBREX (CELECOXIB)

## 2010-07-19 NOTE — Progress Notes (Signed)
Summary: cough,st,congestion   Phone Note Call from Patient Call back at Home Phone [phone]   Caller: Patient Call For: Shaune Leeks MD Summary of Call: Patient states that she has been sick since sunday with congestion, cough, runny nose, sore throat. She has been taking Mucinex and it is not helping. I offered patient app with Sat clinic and she said that she feels to sick to get dressed and drive there. She was hoping to get cough syrup called in for to get her through the weekend. [facility] [address] [phone]. Initial call taken by: Melody Comas,  July 23, 2009 4:14 PM  Follow-up for Phone Call        I would continue mucinex cannot call in med without being seen  if high fever or prod cough- tomorrow (if not imp ) I do recommend sat clinic  Follow-up by: Judith Part MD,  July 23, 2009 4:28 PM  Additional Follow-up for Phone Call Additional follow up Details #1::        Advised pt. Additional Follow-up by: Lowella Petties CMA,  July 23, 2009 4:43 PM

## 2010-07-19 NOTE — Progress Notes (Signed)
Summary: Rx Vitamin D  Phone Note Refill Request Call back at [phone] Message from:  [facility] on August 30, 2009 8:56 AM  Refills Requested: Medication #1:  VITAMIN D 63875 UNIT CAPS one tab by mouth twice a  week.   Supply Requested: 3 months Received faxed refill request.  Medication list and last note say twice a week but fax from [facility] says one tablet every week.  Please advise.     Method Requested: Electronic Initial call taken by: Linde Gillis CMA Duncan Dull),  August 30, 2009 8:57 AM  Follow-up for Phone Call        She needs lab to check Vit D lvl 268.9 and see me after lab back. Can start Vit D 1000Iu (OTC) two times a day for now. Doesn't need the 50000Iu now, or shouldn't. Follow-up by: Shaune Leeks MD,  August 30, 2009 1:33 PM  Additional Follow-up for Phone Call Additional follow up Details #1::        Patient advised as instructed.  Lab appt made and f/u appt made.   Additional Follow-up by: Linde Gillis CMA Duncan Dull),  August 30, 2009 2:47 PM

## 2010-07-21 NOTE — Assessment & Plan Note (Signed)
Summary: CPX/CLE   Vital Signs:  Patient profile:   65 year old female Height:      65 inches Weight:      228.25 pounds BMI:     38.12 Temp:     97.9 degrees F oral Pulse rate:   68 / minute Pulse rhythm:   regular BP sitting:   122 / 70  (right arm) Cuff size:   large  Vitals Entered By: Linde Gillis CMA Duncan Dull) (June 15, 2010 2:51 PM) CC: complete physicial   History of Present Illness: Pt here for Comp Exam. She is now having RLQ posterior back pain where mos ago she was having LLQ back pain. She is taking an exercise class...hurts her most when she is doing it. oes 5 days a week...much like line dancing.  She otherwise feels well.  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 1 By Mouth Daily 2)  Propranolol Hcl Cr 80 Mg  Cp24 (Propranolol Hcl) .Marland Kitchen.. 1 By Mouth Daily 3)  Vitamin D [address] Unit Caps  (Ergocalciferol) .... One Tab By Mouth Twice A  Week. 4)  Elocon 0.1 % Crea (Mometasone Furoate) .... Apply To Area Two Times A Day As Needed Eczema.  Current Medications (verified): 1)  Synthroid 175 Mcg  Tabs (Levothyroxine Sodium) .Marland Kitchen.. 1 By Mouth Daily 2)  Propranolol Hcl Cr 80 Mg  Cp24 (Propranolol Hcl) .Marland Kitchen.. 1 By Mouth Daily 3)  Vitamin D 1000 Unit Tabs (Cholecalciferol) .... Take Two Tablets Daily 4)  Elocon 0.1 % Crea (Mometasone Furoate) .... Apply To Area Two Times A Day As Needed Eczema. 5)  Flexeril 10 Mg Tabs (Cyclobenzaprine Hcl) .... 1/2-1 Tab By Mouth Three Times A Day As Needed.  Allergies: 1)  Celebrex (Celecoxib)  Past History:  Past Medical History: Last updated: 09/07/2006 Diverticulosis, colon: 03/1997 GERD: 1997 GOOD ON PRILOSEC Hyperlipidemia:08/1996 Hypothyroidism: 1984 Osteopenia: 11/2002 VIA DEXA  Past Surgical History: Last updated: 05/19/2009 Colon polypectomy: DIVERTIC:  04/06/1997 B-9 BY PATHOLOGY Hysterectomy: PARTIAL 1978-79 EGD: NML 03/1997 COLONOSCOPY : DIVERTICS ( DR. Marina Goodell) 12/18/2001 DEXA OSTEPENIA: SPINE & FEM. NECK: 11/14/2002 ABD ULTRASOUND+ GALLSTONES 06/06/2006 EGD: REFLUXESOPH. GASTRITIS GERD 07/05/2006 COLONOSCOPY DIVERTICS NEG. POLYPS 07/05/2006 REPEAT 5 YRS.    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 Eyes:  Denies blurring, discharge, and eye pain. ENT:  Denies decreased hearing, ear discharge, hoarseness, and ringing in ears. CV:  Denies chest pain or discomfort, fainting, fatigue, palpitations, shortness of breath with exertion, swelling of feet, and swelling of hands. Resp:  Denies cough, shortness of breath, and wheezing. GI:  Complains of nausea; denies abdominal pain, bloody stools, change in bowel habits, constipation, dark tarry stools, diarrhea, excessive appetite, gas, hemorrhoids, indigestion, loss of appetite, vomiting, vomiting blood, and yellowish skin color; fairly regularly. GU:  Complains of nocturia; denies dysuria and urinary frequency; once regularly. MS:  Complains of low back pain; right sided this time. Derm:  Denies dryness, itching, and rash. Neuro:  Denies numbness, poor balance, tingling, and tremors.  Physical Exam  General:  Well-developed,well-nourished,in no acute distress; alert,appropriate and cooperative throughout examination, obese. Congested. Head:  Normocephalic and atraumatic without obvious abnormalities. No apparent alopecia or balding. Sinuses NT. Eyes:  Conjunctiva clear bilaterally.  Ears:  External ear exam shows no significant lesions or deformities.  Otoscopic examination reveals clear canals, tympanic membranes are intact bilaterally  with nml landmarks but dull light reflex on the right and distortion and appearance of scarring on the left. Hearing is grossly normal bilaterally but hears crickets, less than previously.. Nose:  Mildly inflamed with clear discharge bilatetrally. Mouth:  Oral mucosa and oropharynx without lesions or exudates.  Teeth in good  repair. Neck:  No deformities, masses, or tenderness noted. Chest Wall:  No deformities, masses, or tenderness noted. Breasts:  No mass, nodules, thickening, tenderness, bulging, retraction, inflamation, nipple discharge or skin changes noted.   Lungs:  Normal respiratory effort, chest expands symmetrically. Lungs are clear to auscultation, no crackles or wheezes. Mild congestion anteriorally centrally. No difficulty with deep breathing. Has discomfort approx T9/10 interspace right post axillary line , tender to palpation at the spot focally. Heart:  Normal rate and regular rhythm. S1 and S2 normal without gallop, murmur, click, rub or other extra sounds. Abdomen:  Bowel sounds positive,abdomen soft and non-tender without masses, organomegaly or hernias noted. Protuberant. Rectal:  No external abnormalities noted. Normal sphincter tone. No rectal masses or tenderness. G neg. Sclerosis in the canal posteriorally....old fissure repair. Genitalia:  Bimanual only done Introitus wnl, Uterus and Cervix absent, Adnexa nontender w/o mass, ovaries not felt. Msk:  Waist ROM, flex to 90 degrees with LBP, ext nml, Lat bend bilat to 30degrees, twist 45 degrees. Pulses:  R and L carotid,radial,femoral,dorsalis pedis and posterior tibial pulses are full and equal bilaterally Extremities:  No clubbing, cyanosis, edema, or deformity noted with normal full range of motion of all joints.   Neurologic:  No cranial nerve deficits noted. Station and gait are normal. Sensory, motor and coordinative functions appear intact.  Skin:  Intact without suspicious lesions or rashes Cervical Nodes:  No lymphadenopathy noted Axillary Nodes:  No palpable lymphadenopathy Inguinal Nodes:  No significant adenopathy Psych:  Cognition and judgment appear intact. Alert and cooperative with normal attention span and concentration. No apparent delusions, illusions, hallucinations   Impression & Recommendations:  Problem # 1:  HEALTH  MAINTENANCE EXAM (ICD-V70.0) Assessment Comment Only UTD.  Problem # 2:  VITAMIN D DEFICIENCY (ICD-268.9) Assessment: Unchanged Stable, cont 2000Iu daily.  Problem # 3:  HYPOTHYROIDISM (ICD-244.9) Needs addtl meds. Increase to . Her updated medication list for this problem includes:    Synthroid 200 Mcg Tabs (Levothyroxine sodium) ..... One tab by mouth daily  Problem # 4:  HYPERLIPIDEMIA (ICD-272.4) Assessment: Unchanged Stable. Labs Reviewed: SGOT: 25 (06/07/2010)   SGPT: 24 (06/07/2010)   HDL:41.90 (06/07/2010), 37.70 (05/17/2009)  LDL:112 (06/07/2010), 103 (05/07/2008)  Chol:191 (06/07/2010), 199 (05/17/2009)  Trig:186.0 (06/07/2010), 227.0 (05/17/2009)  Problem # 5:  DIVERTICULOSIS, COLON (ICD-562.10) Assessment: Unchanged  Discussed being seen for LLQ pain.  Colonoscopy:  Labs Reviewed: Hgb: 13.4 (06/07/2010)   Hct: 39.5 (06/07/2010)   WBC: 5.4 (06/07/2010)  Problem # 6:  BACK PAIN, RIGHT FLANK (ICD-724.5) Assessment: New  Sounds muscular. Discussed muscle relaxer and heat and ice. Her updated medication list for this problem includes:    Flexeril 10 Mg Tabs (Cyclobenzaprine hcl) .Marland Kitchen... 1/2-1 tab by mouth three times a day as needed.  Discussed use of moist heat or ice, modified activities, medications, and stretching/strengthening exercises. Back care instructions given. To be seen in 2 weeks if no improvement; sooner if worsening of symptoms.   Complete Medication List: 1)  Synthroid 200 Mcg Tabs (Levothyroxine sodium) .... One tab by mouth daily 2)  Propranolol Hcl Cr 80 Mg Cp24 (Propranolol hcl) .Marland Kitchen.. 1 by mouth daily 3)  Vitamin D 1000 Unit Tabs (Cholecalciferol) .... Take two tablets daily 4)  Elocon 0.1 % Crea (Mometasone furoate) .... Apply to area two times a day as needed eczema. 5)  Flexeril 10 Mg Tabs (Cyclobenzaprine hcl) .... 1/2-1 tab by mouth three times a day as needed.  Patient Instructions: 1)  RTC 3 mos, TSH prior  244.9 Prescriptions: SYNTHROID 200 MCG TABS (LEVOTHYROXINE SODIUM) one tab by mouth daily  #90 x 12   Entered and Authorized by:   Shaune Leeks MD   Signed by:   Shaune Leeks MD on 06/15/2010   Method used:   Print then Give to Patient   RxID:   [id number] SYNTHROID 200 MCG TABS (LEVOTHYROXINE SODIUM) one tab by mouth daily  #30 x 12   Entered and Authorized by:   Shaune Leeks MD   Signed by:   Shaune Leeks MD on 06/15/2010   Method used:   Electronically to        [facility] [phone]* (retail)       [address] [place], [place]  [address]       Ph: [phone]       Fax: [phone]   RxID:   [id number] FLEXERIL 10 MG TABS (CYCLOBENZAPRINE HCL) 1/2-1 tab by mouth three times a day as needed.  #30 x 2   Entered and Authorized by:   Shaune Leeks MD   Signed by:   Shaune Leeks MD on 06/15/2010   Method used:   Electronically to        [facility] [phone]* (retail)  [address] [place], [place]  [address]       Ph: [phone]       Fax: [phone]   RxID:   [id number]    Orders Added: 1)  Est. Patient 40-64 years [84[address]]    Current Allergies (reviewed today): CELEBREX (CELECOXIB)

## 2010-07-23 ENCOUNTER — Encounter: Payer: Self-pay | Admitting: Family Medicine

## 2010-07-28 ENCOUNTER — Emergency Department: Payer: Self-pay | Admitting: Emergency Medicine

## 2010-07-29 ENCOUNTER — Telehealth: Payer: Self-pay | Admitting: Family Medicine

## 2010-08-01 ENCOUNTER — Telehealth: Payer: Self-pay | Admitting: Family Medicine

## 2010-08-02 ENCOUNTER — Ambulatory Visit: Payer: Self-pay | Admitting: Surgery

## 2010-08-02 ENCOUNTER — Encounter: Payer: Self-pay | Admitting: Family Medicine

## 2010-08-03 ENCOUNTER — Telehealth: Payer: Self-pay | Admitting: Family Medicine

## 2010-08-03 ENCOUNTER — Ambulatory Visit: Payer: Self-pay | Admitting: Family Medicine

## 2010-08-04 ENCOUNTER — Ambulatory Visit (INDEPENDENT_AMBULATORY_CARE_PROVIDER_SITE_OTHER): Payer: Self-pay | Admitting: Family Medicine

## 2010-08-04 ENCOUNTER — Encounter: Payer: Self-pay | Admitting: Family Medicine

## 2010-08-04 DIAGNOSIS — S2249XA Multiple fractures of ribs, unspecified side, initial encounter for closed fracture: Secondary | ICD-10-CM
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
## 2010-08-10 NOTE — Progress Notes (Signed)
Summary: promethazine   Phone Note Refill Request Message from:  Fax from Pharmacy on August 03, 2010 9:15 AM  Refills Requested: Medication #1:  PROMETHAZINE HCL 25 MG TABS 1/2 to 1 tab by mouth two times a day as needed for nausea with sedation caution   Last Refilled: 10/30/2009 Refill request from [facility]. [phone]  Initial call taken by: Melody Comas,  August 03, 2010 9:15 AM    Prescriptions: PROMETHAZINE HCL 25 MG TABS (PROMETHAZINE HCL) 1/2 to 1 tab by mouth two times a day as needed for nausea with sedation caution  #30 x 0   Entered and Authorized by:   Shaune Leeks MD   Signed by:   Shaune Leeks MD on 08/03/2010   Method used:   Electronically to        [facility] [phone]* (retail)       [address] [place], [place]  [address]       Ph: [phone]       Fax: [phone]   RxID:   [id number]
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
## 2010-08-15 ENCOUNTER — Telehealth: Payer: Self-pay | Admitting: Family Medicine

## 2010-08-25 NOTE — Progress Notes (Signed)
Summary: needs refill on percocet  Phone Note Call from Patient Call back at [phone]   Caller: Patient Summary of Call: Pt is asking for a refill on percocet, she will not have enough to last tomorrow.  Dr. Serita Butcher is slowly weaning her off, but for now she is to take 4 a day.  Please call when ready.                 Lowella Petties CMA, AAMA  August 15, 2010 3:42 PM   Follow-up for Phone Call        please give to patient.  thanks.  then send the note Dr. Hetty Ely for his information.  Follow-up by: Crawford Givens MD,  August 15, 2010 4:27 PM  Additional Follow-up for Phone Call Additional follow up Details #1::        Patient Advised. Prescription left at front desk.  Additional Follow-up by: Delilah Shan CMA (AAMA),  August 15, 2010 4:53 PM    New/Updated Medications: PERCOCET 5-325 MG TABS (OXYCODONE-ACETAMINOPHEN) 1 by mouth 4 times a day for one week, then 3 times a day for one week then 2 times a day for one week , sedation caution Prescriptions: PERCOCET 5-325 MG TABS (OXYCODONE-ACETAMINOPHEN) 1 by mouth 4 times a day for one week, then 3 times a day for one week then 2 times a day for one week , sedation caution  #48 x 0   Entered and Authorized by:   Crawford Givens MD   Signed by:   Crawford Givens MD on 08/15/2010   Method used:   Print then Give to Patient   RxID:   [id number]

## 2010-08-31 ENCOUNTER — Other Ambulatory Visit: Payer: Self-pay

## 2010-08-31 ENCOUNTER — Encounter (INDEPENDENT_AMBULATORY_CARE_PROVIDER_SITE_OTHER): Payer: Self-pay | Admitting: *Deleted

## 2010-08-31 ENCOUNTER — Other Ambulatory Visit: Payer: Self-pay | Admitting: Family Medicine

## 2010-08-31 ENCOUNTER — Other Ambulatory Visit (INDEPENDENT_AMBULATORY_CARE_PROVIDER_SITE_OTHER): Payer: BC Managed Care – PPO

## 2010-08-31 ENCOUNTER — Telehealth: Payer: Self-pay | Admitting: Family Medicine

## 2010-08-31 DIAGNOSIS — E039 Hypothyroidism, unspecified: Secondary | ICD-10-CM

## 2010-09-06 NOTE — Progress Notes (Signed)
Summary: refill request for percocet  Phone Note Refill Request Call back at Home Phone [phone] Message from:  Patient  Refills Requested: Medication #1:  PERCOCET 5-325 MG TABS 1 by mouth 4 times a day for one week Please call pt when ready.  Initial call taken by: Lowella Petties CMA, AAMA,  August 31, 2010 2:34 PM  Follow-up for Phone Call        Would like her to transition to Vicodin if poss. One 4 times a day if needed for one week and decrease weekly by one a day. Shaune Leeks MD  August 31, 2010 5:40 PM   [facility] for pt to call back.           Lowella Petties CMA, AAMA  September 01, 2010 9:18 AM   Additional Follow-up for Phone Call Additional follow up Details #1::        Advised pt, script called to [facility]. Additional Follow-up by: Lowella Petties CMA, AAMA,  September 01, 2010 10:10 AM    New/Updated Medications: VICODIN 5-500 MG TABS (HYDROCODONE-ACETAMINOPHEN) one tab by mouth 4 times a day for one week, three times a day for one week, two times a day for one week aand then one a day for one week. Prescriptions: VICODIN 5-500 MG TABS (HYDROCODONE-ACETAMINOPHEN) one tab by mouth 4 times a day for one week, three times a day for one week, two times a day for one week aand then one a day for one week.  #50 x 1   Entered and Authorized by:   Shaune Leeks MD   Signed by:   Lowella Petties CMA, AAMA on 09/01/2010   Method used:   Telephoned to ...         RxID:   [id number]

## 2010-09-08 ENCOUNTER — Ambulatory Visit: Payer: Self-pay | Admitting: Family Medicine

## 2010-09-21 ENCOUNTER — Encounter: Payer: Self-pay | Admitting: Family Medicine

## 2010-09-21 ENCOUNTER — Ambulatory Visit (INDEPENDENT_AMBULATORY_CARE_PROVIDER_SITE_OTHER): Payer: BC Managed Care – PPO | Admitting: Family Medicine

## 2010-09-21 VITALS — BP 112/72 | HR 80 | Temp 98.6°F | Ht 65.0 in | Wt 223.8 lb

## 2010-09-21 DIAGNOSIS — E039 Hypothyroidism, unspecified: Secondary | ICD-10-CM

## 2010-09-21 DIAGNOSIS — S2249XA Multiple fractures of ribs, unspecified side, initial encounter for closed fracture: Secondary | ICD-10-CM

## 2010-09-21 DIAGNOSIS — Z0000 Encounter for general adult medical examination without abnormal findings: Secondary | ICD-10-CM
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
## 2010-09-21 NOTE — Patient Instructions (Signed)
Refer to Ortho, [facility], Dr Hyacinth Meeker.

## 2010-09-21 NOTE — Assessment & Plan Note (Signed)
Euthyroid on curr dose. Cont.  Lab Results  Component Value Date   TSH 3.15 08/31/2010
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
## 2010-10-21 ENCOUNTER — Encounter: Payer: Self-pay | Admitting: Cardiovascular Disease

## 2010-10-21 ENCOUNTER — Emergency Department: Payer: Self-pay | Admitting: Emergency Medicine

## 2010-10-24 ENCOUNTER — Ambulatory Visit: Payer: BC Managed Care – PPO | Admitting: Cardiovascular Disease

## 2010-10-31 ENCOUNTER — Encounter: Payer: Self-pay | Admitting: Cardiovascular Disease

## 2010-10-31 ENCOUNTER — Ambulatory Visit (INDEPENDENT_AMBULATORY_CARE_PROVIDER_SITE_OTHER): Payer: BC Managed Care – PPO | Admitting: Cardiovascular Disease

## 2010-10-31 DIAGNOSIS — I10 Essential (primary) hypertension: Secondary | ICD-10-CM | POA: Insufficient documentation

## 2010-10-31 DIAGNOSIS — E669 Obesity, unspecified: Secondary | ICD-10-CM

## 2010-10-31 DIAGNOSIS — I4891 Unspecified atrial fibrillation: Secondary | ICD-10-CM

## 2010-10-31 DIAGNOSIS — E039 Hypothyroidism, unspecified: Secondary | ICD-10-CM

## 2010-10-31 DIAGNOSIS — E785 Hyperlipidemia, unspecified: Secondary | ICD-10-CM

## 2010-10-31 MED ORDER — DILTIAZEM HCL 30 MG PO TABS: 30.0000 mg | ORAL_TABLET | ORAL | Status: DC | PRN
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
## 2010-10-31 NOTE — Assessment & Plan Note (Signed)
Her obesity could be contributing to her atrial fibrillation. 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

## 2010-10-31 NOTE — Assessment & Plan Note (Signed)
We have encouraged continued exercise, careful diet management in an effort to lose weight. 

## 2010-10-31 NOTE — Patient Instructions (Signed)
Start Diltiazem 30mg  1-2 tablets as needed. TO BE SCHEDULED ON A Thursday AM: Your physician has requested that you have an echocardiogram. Echocardiography is a painless test that uses sound waves to create images of your heart. It provides your doctor with information about the size and shape of your heart and how well your heart's chambers and valves are working. This procedure takes approximately one hour. There are no restrictions for this procedure. Your physician recommends that you schedule a follow-up appointment in: 6 MONTHS  But, if you are having more palpitations call our office and we can schedule an appt sooner. If your echo is normal, we will see you in 6 months.

## 2010-10-31 NOTE — Assessment & Plan Note (Signed)
Blood pressure is well controlled on today's visit. No changes made to the medications. 

## 2010-10-31 NOTE — Assessment & Plan Note (Signed)
TSH was slightly higher than 5. This is probably unrelated to her recent episode of atrial fibrillation.

## 2010-10-31 NOTE — Assessment & Plan Note (Signed)
Recent episode of atrial fibrillation that was self terminating after several hours on May 4. We have ordered an echocardiogram to evaluate her cardiac function and valves, as well as right heart pressures. We have given her a prescription for diltiazem 30 mg to take p.r.n. If she does convert to atrial fibrillation again.

## 2010-11-01 NOTE — Assessment & Plan Note (Signed)
[facility]                                 ON-CALL NOTE   NAME:Kara Wright, Kara Wright                         MRN:          [id number]  DATE:02/02/2007                            DOB:          07/13/1945    TELEPHONE NUMBER:  [phone]   The patient called complaining of nausea and vomiting. She is coming  this morning to Saturday clinic to be evaluated.     Lelon Perla, DO  Electronically Signed    Shawnie Dapper  DD: 02/02/2007  DT: 02/03/2007  Job #: [id number]   cc:   Arta Silence, MD

## 2010-11-04 NOTE — Op Note (Signed)
NAMEANNIKA, SELKE                ACCOUNT NO.:  [id number]   MEDICAL RECORD NO.:  [id number]          PATIENT TYPE:  AMB   LOCATION:  SDS                          FACILITY:  [facility]   PHYSICIAN:  Cherylynn Ridges, M.D.    DATE OF BIRTH:  October 17, 1945   DATE OF PROCEDURE:  08/03/2006  DATE OF DISCHARGE:                               OPERATIVE REPORT   PREOPERATIVE DIAGNOSIS:  Symptomatic cholelithiasis.   POSTOPERATIVE DIAGNOSIS:  Symptomatic cholelithiasis.   PROCEDURE:  Laparoscopic cholecystectomy.   SURGEON:  Cherylynn Ridges, M.D.   ASSISTANT:  Rose Phi. Maple Hudson, M.D.   ANESTHESIA:  General endotracheal.   ESTIMATED BLOOD LOSS:  Less than 30 cc.   COMPLICATIONS:  None.   CONDITION:  Stable.   INDICATIONS FOR OPERATION:  The patient is a 65 year old female with  abdominal pain and gallstones who now comes in for laparoscopic  cholecystectomy.   FINDINGS:  The patient had significant ending of the gallbladder wall  and some adhesions of omentum and the duodenum stuck up to the  infundibulum.  Cholangiogram was not performed because the cystic duct  was very small and prohibitive for effective study. She had normal  preoperative liver function tests.   OPERATION:  The patient was taken to the operating room and placed on  the table in a supine position.  After an adequate endotracheal  anesthetic was administered, she was prepped and draped in the usual  sterile manner exposing the midline and the right upper quadrant of the  abdomen.   A supraumbilical curvilinear incision was made using a #11 blade and  taken down to the midline fascia.  We then grabbed the fascia using a  Kocher clamp x2 and then made an incision between the two Kocher clamps.  This opened up into the fascia.  We were able to then bluntly dissect  down to the peritoneal cavity using a Kelly clamp.  Once this was done,  a pursestring suture of 0 Vicryl was passed around the fascial opening,  and the Hassan  cannula passed into the peritoneal cavity without event.  It was secured in place with a pursestring suture.   The patient was placed in reverse Trendelenburg position, and the left  side was tilted down.  Two right-sided 5 mm cannulas and a subxiphoid 12  mm cannula were passed under direct vision into the peritoneal cavity.  Carbon dioxide insufflation had been instilled into the peritoneal  cavity up to maximal pressure of 50 mmHg.   Once this was completed and all cannulas were in placed, the dissection  was begun.   A grasper was passed on the dome of the gallbladder and retracted  towards the anterior abdominal wall and the right upper quadrant.  A  second grasper was passed onto the infundibulum.  We then dissected out  to the cystic duct and cystic artery at the triangle of Calot and  hepatoduodenal triangle.  This allowed [place] to isolate both the cystic  artery and the cystic duct.  The cystic artery was external to the Calot  triangle and was  ligated with Endoclips proximally and distally and then  transected.  The cystic duct, however, was much smaller, and we were  able to get an adequate window around the cystic duct and then  subsequently passed two clips distally and proximally and transected.  We then dissected out the gallbladder from the hepatic bed with minimal  difficulty.  An EndoCatch bag was used to bring it out from the  supraumbilical site.  There was spillage of bile during the dissection,  and we aspirated and irrigated most of the bile from the wound.   Once the gallbladder was removed and all fluid was removed, we aspirated  above the liver, removing all fluid and gas.  We removed all cannulas  and closed the supraumbilical site using a pursestring suture which was  in place.  We reinforced the left lateral corner with a second figure-of-  eight stitch of 0 Vicryl.   0.25% Marcaine with epinephrine was injected in all incisions. The  lateral 5 mm cannula  sites were closed using Dermabond.  The  supraumbilical and subxiphoid incisions were closed using a running  subcuticular stitch of 4-0 Monocryl.  All counts were correct.  We did  not use Steri-Strips on the patient, and she has problems with that. We  did cover then with 2x2s and Tegaderm.      Cherylynn Ridges, M.D.  Electronically Signed     JOW/MEDQ  D:  08/03/2006  T:  08/03/2006  Job:  [id number]

## 2010-11-04 NOTE — Assessment & Plan Note (Signed)
[facility]                         GASTROENTEROLOGY OFFICE NOTE   NAME:Kara Wright, Kara Wright                       MRN:          [id number]  DATE:06/05/2006                            DOB:          [birth year]    REASON FOR CONSULTATION:  Hemoccult positive stool.   HISTORY:  This is a 65 year old white female with a history of  gastroesophageal reflux disease and hypothyroidism who referred through  the courtesy of Dr. Hetty Ely regarding Hemoccult positive stool.  The  patient has had prior GI evaluation for reflux disease.  She underwent  upper endoscopy in 1998 which revealed mild esophagitis.  She also has a  family history of colon cancer in her father, and underwent colonoscopy  in 1998 and again in 2003.  She was noted to small non-adenomatous polyp  and mild diverticular change only.  More recently, she has been having  problems with nausea, indigestion and chest and abdominal discomfort.  On proton pump inhibitor therapy, symptoms are improved, but not  completely.  Her chest and abdominal discomfort is described as sharp  and occurring on either side of the abdomen with occasional radiation  into the back.  She is scheduled for abdominal ultrasound tomorrow.  Patient denies vomiting, melena or hematochezia.  She does use Mobic and  Aleve for arthritis.  She has had 15-pound weight gain in the past 6  months.   Recent testing with Dr. Hetty Ely June 02, 2006 revealed a normal CBC  with a hemoglobin of 13.8.  Normal comprehensive metabolic panel except  for a mildly elevated glucose.  Normal thyroid stimulating hormone.  She  did submit Hemoccult studies which returned positive for occult blood.   PAST MEDICAL HISTORY:  1. Hypothyroidism.  2. Gastroesophageal reflux disease.   PAST SURGICAL HISTORY:  1. Hysterectomy.  2. Tubal ligation.  3. Fissure repair.  4. Benign lesion of the vagina removed.   ALLERGIES:  NO KNOWN DRUG  ALLERGIES.   CURRENT MEDICATIONS:  1. Propranolol uncertain dosage daily.  2. Synthroid 0.175 mg daily.  3. Prilosec 20 mg daily.   P.r.n. medications include Lorcet, Phenergan, Mobic, Aleve and Zyrtec.   FAMILY HISTORY:  Father with colon cancer.   SOCIAL HISTORY:  Patient is married with 2 daughters.  Lives with her  husband.  High school graduate and attended college.  Works as a Sports administrator.  Does not smoke or use alcohol.   REVIEW OF SYSTEMS:  Per diagnostic evaluation form.   PHYSICAL EXAMINATION:  Well-appearing female, in no acute distress.  Blood pressure is 100/60.  Heart rate 72 and regular.  Weight is 242.8  pounds.  She is 5 feet 5 inches in height.  HEENT:  Sclerae anicteric.  Conjunctivae are pink.  Oral mucosa intact.  No adenopathy.  LUNGS:  Clear.  HEART:  Regular.  ABDOMEN:  Soft without tenderness, mass or hernia.  Good bowel sounds  heard.  EXTREMITIES:  Without edema.   IMPRESSION:  1. Hemoccult positive stool.  Likely due to nonsteroidal      antiinflammatory induced mucosal lesion.  2. Gastroesophageal reflux disease.  Some breakthrough symptoms on      Prilosec.  3. Intermittent problems with chest and upper abdominal discomfort.      Possibly due to reflux.  Rule out ulcer disease.  Rule out      gallbladder disease.  4. Family history of colon cancer in a 1st degree relative.  Last      colonoscopy 4-1/2 years ago.   RECOMMENDATIONS:  1. Continue proton pump inhibitor therapy.  This may need to be      increased to b.i.d. if reflux symptoms persist on once daily      therapy.  2. Keep appointment for previously scheduled abdominal ultrasound.  3. Reflux precautions.  4. Colonoscopy to evaluate Hemoccult positive stool and provide      screening.  5. Upper endoscopy to evaluate Hemoccult positive stool as well as      abdominal,chest discomfort,and breakthrough reflux symptoms.  The      nature of both procedures as well as the risks,  benefits and      alternatives were reviewed in detail.  She understood and agreed to      proceed.     Wilhemina Bonito. Marina Goodell, MD  Electronically Signed    JNP/MedQ  DD: 06/07/2006  DT: 06/07/2006  Job #: [id number]   cc:   Arta Silence, MD

## 2010-11-07 ENCOUNTER — Ambulatory Visit: Payer: Self-pay | Admitting: Family Medicine

## 2010-11-10 ENCOUNTER — Encounter: Payer: Self-pay | Admitting: *Deleted

## 2010-11-16 ENCOUNTER — Encounter: Payer: Self-pay | Admitting: Family Medicine

## 2010-11-17 ENCOUNTER — Other Ambulatory Visit (INDEPENDENT_AMBULATORY_CARE_PROVIDER_SITE_OTHER): Payer: BC Managed Care – PPO | Admitting: *Deleted

## 2010-11-17 DIAGNOSIS — I4891 Unspecified atrial fibrillation: Secondary | ICD-10-CM

## 2010-12-05 ENCOUNTER — Encounter: Payer: Self-pay | Admitting: Cardiovascular Disease

## 2010-12-22 ENCOUNTER — Other Ambulatory Visit: Payer: Self-pay | Admitting: *Deleted

## 2010-12-22 MED ORDER — PROMETHAZINE HCL 25 MG PO TABS: 25.0000 mg | ORAL_TABLET | Freq: Two times a day (BID) | ORAL | Status: DC | PRN

## 2011-02-13 ENCOUNTER — Other Ambulatory Visit: Payer: Self-pay | Admitting: Family Medicine

## 2011-02-13 MED ORDER — PROPRANOLOL HCL ER 80 MG PO CP24: 80.0000 mg | ORAL_CAPSULE | Freq: Every day | ORAL | Status: DC

## 2011-02-13 MED ORDER — LEVOTHYROXINE SODIUM 200 MCG PO TABS: 200.0000 ug | ORAL_TABLET | Freq: Every day | ORAL | Status: DC

## 2011-02-13 NOTE — Telephone Encounter (Signed)
Received refill request from [facility] for Levothyroxine , which did not match med list. Spoke to patient and was informed that this was changed to when last lab work was done. Patient also requested a refill on her Propranolol. Refills sent to pharmacy per patient's request.

## 2011-04-13 ENCOUNTER — Ambulatory Visit (INDEPENDENT_AMBULATORY_CARE_PROVIDER_SITE_OTHER): Payer: BC Managed Care – PPO

## 2011-04-13 DIAGNOSIS — Z23 Encounter for immunization: Secondary | ICD-10-CM

## 2011-06-06 ENCOUNTER — Other Ambulatory Visit: Payer: Self-pay | Admitting: Family Medicine

## 2011-06-06 DIAGNOSIS — M8580 Other specified disorders of bone density and structure, unspecified site: Secondary | ICD-10-CM

## 2011-06-06 DIAGNOSIS — I10 Essential (primary) hypertension: Secondary | ICD-10-CM

## 2011-06-06 DIAGNOSIS — E039 Hypothyroidism, unspecified: Secondary | ICD-10-CM

## 2011-06-09 ENCOUNTER — Encounter: Payer: Self-pay | Admitting: Internal Medicine

## 2011-06-15 ENCOUNTER — Other Ambulatory Visit: Payer: BC Managed Care – PPO

## 2011-06-15 ENCOUNTER — Other Ambulatory Visit (INDEPENDENT_AMBULATORY_CARE_PROVIDER_SITE_OTHER): Payer: BC Managed Care – PPO

## 2011-06-15 DIAGNOSIS — M8580 Other specified disorders of bone density and structure, unspecified site: Secondary | ICD-10-CM

## 2011-06-15 DIAGNOSIS — E039 Hypothyroidism, unspecified: Secondary | ICD-10-CM

## 2011-06-15 DIAGNOSIS — I10 Essential (primary) hypertension: Secondary | ICD-10-CM

## 2011-06-15 LAB — COMPREHENSIVE METABOLIC PANEL
Albumin: 4 g/dL (ref 3.5–5.2)
BUN: 11 mg/dL (ref 6–23)
CO2: 33 mEq/L — ABNORMAL HIGH (ref 19–32)
Calcium: 9.4 mg/dL (ref 8.4–10.5)
GFR: 93.92 mL/min (ref >60.00)
Glucose, Bld: 99 mg/dL (ref 70–99)
Potassium: 4.2 mEq/L (ref 3.5–5.1)
Sodium: 139 mEq/L (ref 135–145)
Total Protein: 7.2 g/dL (ref 6.0–8.3)

## 2011-06-15 LAB — LIPID PANEL: HDL: 44.5 mg/dL (ref >39.00)

## 2011-06-15 LAB — TSH: TSH: 0.3 u[IU]/mL — ABNORMAL LOW (ref 0.35–5.50)

## 2011-06-16 LAB — VITAMIN D 25 HYDROXY (VIT D DEFICIENCY, FRACTURES): Vit D, 25-Hydroxy: 24 ng/mL — ABNORMAL LOW (ref 30–89)

## 2011-06-19 ENCOUNTER — Encounter: Payer: BC Managed Care – PPO | Admitting: Family Medicine

## 2011-06-23 ENCOUNTER — Encounter: Payer: Self-pay | Admitting: Family Medicine

## 2011-06-23 ENCOUNTER — Ambulatory Visit (INDEPENDENT_AMBULATORY_CARE_PROVIDER_SITE_OTHER): Payer: BC Managed Care – PPO | Admitting: Family Medicine

## 2011-06-23 VITALS — BP 112/74 | HR 75 | Temp 98.1°F | Wt 235.0 lb

## 2011-06-23 DIAGNOSIS — G43909 Migraine, unspecified, not intractable, without status migrainosus: Secondary | ICD-10-CM

## 2011-06-23 DIAGNOSIS — K621 Rectal polyp: Secondary | ICD-10-CM

## 2011-06-23 DIAGNOSIS — M949 Disorder of cartilage, unspecified: Secondary | ICD-10-CM

## 2011-06-23 DIAGNOSIS — I10 Essential (primary) hypertension: Secondary | ICD-10-CM

## 2011-06-23 DIAGNOSIS — E039 Hypothyroidism, unspecified: Secondary | ICD-10-CM

## 2011-06-23 DIAGNOSIS — I4891 Unspecified atrial fibrillation: Secondary | ICD-10-CM

## 2011-06-23 DIAGNOSIS — E785 Hyperlipidemia, unspecified: Secondary | ICD-10-CM

## 2011-06-23 DIAGNOSIS — Z0000 Encounter for general adult medical examination without abnormal findings: Secondary | ICD-10-CM

## 2011-06-23 DIAGNOSIS — E669 Obesity, unspecified: Secondary | ICD-10-CM

## 2011-06-23 DIAGNOSIS — M8580 Other specified disorders of bone density and structure, unspecified site: Secondary | ICD-10-CM

## 2011-06-23 DIAGNOSIS — Z1231 Encounter for screening mammogram for malignant neoplasm of breast: Secondary | ICD-10-CM

## 2011-06-23 DIAGNOSIS — K620 Anal polyp: Secondary | ICD-10-CM

## 2011-06-23 MED ORDER — LEVOTHYROXINE SODIUM 200 MCG PO TABS: 200.0000 ug | ORAL_TABLET | Freq: Every day | ORAL | Status: DC

## 2011-06-23 MED ORDER — PROPRANOLOL HCL ER 80 MG PO CP24: 80.0000 mg | ORAL_CAPSULE | Freq: Every day | ORAL | Status: DC

## 2011-06-23 NOTE — Progress Notes (Signed)
CPE- See plan.  Routine anticipatory guidance given to patient.  See health maintenance.  She'll call about GI f/u.  She's due for f/u with Dr. Marina Goodell.    Hypothyroid.  Compliant with meds. No dysphagia or neck pain.  TSH d/w pt.   AF, no more sig episodes. Occ palpitation, lasts a few seconds, resolved before she could take prn meds.    Migraines better on beta blocker.  Rare hydrocodone use.   Osteopenia.  Due for DXA.  Will help set this up at time of mammogram.   PMH and SH reviewed  Meds, vitals, and allergies reviewed.   ROS: See HPI.  Otherwise negative.    GEN: nad, alert and oriented HEENT: mucous membranes moist NECK: supple w/o LA CV: rrr. PULM: ctab, no inc wob ABD: soft, +bs EXT: no edema SKIN: no acute rash Breast exam: No mass, nodules, thickening, tenderness, bulging, retraction, inflamation, nipple discharge or skin changes noted.  No axillary or clavicular LA.  Chaperoned exam.

## 2011-06-23 NOTE — Patient Instructions (Addendum)
Check with the dermatologist about the lidex for eczema.   Recheck TSH in 8 weeks.  Change synthroid dose- 1 tab a day except for 1/2 tab on Sundays.  Recheck other labs in 1 year.   Keep exercising.   Take care.  See Shirlee Limerick about your referral before you leave today.

## 2011-06-25 DIAGNOSIS — Z0000 Encounter for general adult medical examination without abnormal findings: Secondary | ICD-10-CM | POA: Insufficient documentation

## 2011-06-25 NOTE — Assessment & Plan Note (Signed)
Improved on BB

## 2011-06-25 NOTE — Assessment & Plan Note (Signed)
Dec replacement by 1/2 tab a week and recheck in ~2 months.

## 2011-06-25 NOTE — Assessment & Plan Note (Signed)
Refer back for DXA this summer.

## 2011-06-25 NOTE — Assessment & Plan Note (Signed)
Refer for DXA and mammogram.  She'll call about GI f/u for colonoscopy.   Zosta, Flu and td okay.   PNA at 66.  No need for pap.  D/w pt about diet and exercise.

## 2011-06-25 NOTE — Assessment & Plan Note (Signed)
Elevated TG, d/w pt about weight loss.

## 2011-06-25 NOTE — Assessment & Plan Note (Signed)
Doing well, has CCB for prn use, if needed.

## 2011-06-25 NOTE — Assessment & Plan Note (Signed)
Controlled , no change in meds 

## 2011-06-25 NOTE — Assessment & Plan Note (Signed)
She'll call about f/u.

## 2011-06-25 NOTE — Assessment & Plan Note (Signed)
D/w pt about weight loss.  

## 2011-06-29 ENCOUNTER — Encounter: Payer: Self-pay | Admitting: Family Medicine

## 2011-06-29 ENCOUNTER — Ambulatory Visit: Payer: Self-pay | Admitting: Family Medicine

## 2011-06-29 DIAGNOSIS — M949 Disorder of cartilage, unspecified: Secondary | ICD-10-CM

## 2011-07-03 ENCOUNTER — Encounter: Payer: Self-pay | Admitting: *Deleted

## 2011-07-13 ENCOUNTER — Encounter: Payer: Self-pay | Admitting: Family Medicine

## 2011-07-13 ENCOUNTER — Ambulatory Visit (INDEPENDENT_AMBULATORY_CARE_PROVIDER_SITE_OTHER): Payer: BC Managed Care – PPO | Admitting: Family Medicine

## 2011-07-13 VITALS — BP 120/62 | HR 76 | Temp 98.1°F | Wt 232.0 lb

## 2011-07-13 DIAGNOSIS — J988 Other specified respiratory disorders: Secondary | ICD-10-CM | POA: Insufficient documentation

## 2011-07-13 DIAGNOSIS — J069 Acute upper respiratory infection, unspecified: Secondary | ICD-10-CM

## 2011-07-13 DIAGNOSIS — J029 Acute pharyngitis, unspecified: Secondary | ICD-10-CM

## 2011-07-13 NOTE — Patient Instructions (Signed)
Drink plenty of fluids, take tylenol as needed, and gargle with warm salt water for your throat.  Take mucinex and this should gradually improve.  Take care.  Let us know if you have other concerns.

## 2011-07-13 NOTE — Assessment & Plan Note (Signed)
Likely viral with ETD.  Supportive tx, abx not likely to be useful.  F/u prn.

## 2011-07-13 NOTE — Progress Notes (Signed)
ST started Monday night.  Voice is hoarse.  Postnasal gtt and this has caused a cough.  She's taking mucinex, with some relief.  Possible low fever, but pt is not certain about this.  Some aches.  R ear pain.  +rhinorrhea.    RST neg.   Meds, vitals, and allergies reviewed.   ROS: See HPI.  Otherwise, noncontributory.  GEN: nad, alert and oriented HEENT: mucous membranes moist, tm w/o erythema, nasal exam w/o erythema, clear discharge noted,  OP with cobblestoning, poor TM movement on valsalva NECK: supple w/o LA CV: rrr.   PULM: ctab, no inc wob EXT: no edema SKIN: no acute rash

## 2011-08-18 DIAGNOSIS — I639 Cerebral infarction, unspecified: Secondary | ICD-10-CM

## 2011-08-18 HISTORY — DX: Cerebral infarction, unspecified: I63.9

## 2011-08-26 ENCOUNTER — Telehealth: Payer: Self-pay | Admitting: Family Medicine

## 2011-08-26 ENCOUNTER — Emergency Department: Payer: Self-pay | Admitting: Emergency Medicine

## 2011-08-26 DIAGNOSIS — G459 Transient cerebral ischemic attack, unspecified: Secondary | ICD-10-CM

## 2011-08-26 LAB — COMPREHENSIVE METABOLIC PANEL
Anion Gap: 13 (ref 7–16)
Calcium, Total: 9.2 mg/dL (ref 8.5–10.1)
Chloride: 100 mmol/L (ref 98–107)
Co2: 26 mmol/L (ref 21–32)
Creatinine: 0.8 mg/dL (ref 0.60–1.30)
EGFR (African American): >60
EGFR (Non-African Amer.): >60
Glucose: 129 mg/dL — ABNORMAL HIGH (ref 65–99)
Osmolality: 279 (ref 275–301)
Potassium: 3.8 mmol/L (ref 3.5–5.1)
SGOT(AST): 25 U/L (ref 15–37)

## 2011-08-26 LAB — TROPONIN I: Troponin-I: <0.02 ng/mL

## 2011-08-26 LAB — CBC
HGB: 13.8 g/dL (ref 12.0–16.0)
RBC: 4.94 10*6/uL (ref 3.80–5.20)
WBC: 8.5 10*3/uL (ref 3.6–11.0)

## 2011-08-26 LAB — CK TOTAL AND CKMB (NOT AT ARMC): CK, Total: 58 U/L (ref 21–215)

## 2011-08-26 NOTE — Telephone Encounter (Signed)
Received a call from [facility] ER physician. Kara Wright had presumed TIA symptoms yesterday- sx completely resolved. Head CT and labs wnl limits. She was sent home with ASA and advised to work up as outpt as pt preferred to not be admitted for further work up (i.e carotid dopplers and echo). Pt advised to call for follow up appt on Monday and I will forward note to PCP.

## 2011-08-27 ENCOUNTER — Other Ambulatory Visit: Payer: Self-pay | Admitting: Family Medicine

## 2011-08-27 DIAGNOSIS — G459 Transient cerebral ischemic attack, unspecified: Secondary | ICD-10-CM | POA: Insufficient documentation

## 2011-08-27 NOTE — Telephone Encounter (Signed)
Addended by: Lars Mage on: 08/27/2011 05:33 PM   Modules accepted: Orders

## 2011-08-27 NOTE — Telephone Encounter (Signed)
Please call pt.  She'll need doppler of carotids and echo.  I ordered both.  I'd like her to see Dr. Mariah Milling re: coumadin- please help her get this set up.  She'll likely need statin.  I'd like her to get the studies done and then see cards/LSC.  Please talk to me tomorrow AM before scheduling her with me.  Thanks.

## 2011-08-28 ENCOUNTER — Other Ambulatory Visit: Payer: Self-pay

## 2011-08-28 ENCOUNTER — Telehealth: Payer: Self-pay | Admitting: Family Medicine

## 2011-08-28 ENCOUNTER — Emergency Department (HOSPITAL_COMMUNITY): Payer: BC Managed Care – PPO

## 2011-08-28 ENCOUNTER — Encounter (HOSPITAL_COMMUNITY): Payer: Self-pay

## 2011-08-28 ENCOUNTER — Inpatient Hospital Stay (HOSPITAL_COMMUNITY)
Admission: EM | Admit: 2011-08-28 | Discharge: 2011-08-30 | DRG: 025 | Disposition: A | Discharge status: Home / Self Care | Payer: BC Managed Care – PPO | Attending: Internal Medicine | Admitting: Internal Medicine

## 2011-08-28 ENCOUNTER — Encounter: Payer: Self-pay | Admitting: Cardiovascular Disease

## 2011-08-28 DIAGNOSIS — G43809 Other migraine, not intractable, without status migrainosus: Principal | ICD-10-CM | POA: Diagnosis present

## 2011-08-28 DIAGNOSIS — I4891 Unspecified atrial fibrillation: Secondary | ICD-10-CM | POA: Diagnosis present

## 2011-08-28 DIAGNOSIS — E039 Hypothyroidism, unspecified: Secondary | ICD-10-CM | POA: Diagnosis present

## 2011-08-28 DIAGNOSIS — K5730 Diverticulosis of large intestine without perforation or abscess without bleeding: Secondary | ICD-10-CM | POA: Diagnosis present

## 2011-08-28 DIAGNOSIS — M899 Disorder of bone, unspecified: Secondary | ICD-10-CM | POA: Diagnosis present

## 2011-08-28 DIAGNOSIS — G459 Transient cerebral ischemic attack, unspecified: Secondary | ICD-10-CM | POA: Diagnosis present

## 2011-08-28 DIAGNOSIS — E785 Hyperlipidemia, unspecified: Secondary | ICD-10-CM | POA: Diagnosis present

## 2011-08-28 DIAGNOSIS — Z7982 Long term (current) use of aspirin: Secondary | ICD-10-CM

## 2011-08-28 DIAGNOSIS — M2669 Other specified disorders of temporomandibular joint: Secondary | ICD-10-CM | POA: Diagnosis present

## 2011-08-28 DIAGNOSIS — Z79899 Other long term (current) drug therapy: Secondary | ICD-10-CM

## 2011-08-28 DIAGNOSIS — G43909 Migraine, unspecified, not intractable, without status migrainosus: Secondary | ICD-10-CM | POA: Insufficient documentation

## 2011-08-28 DIAGNOSIS — M949 Disorder of cartilage, unspecified: Secondary | ICD-10-CM | POA: Diagnosis present

## 2011-08-28 DIAGNOSIS — R209 Unspecified disturbances of skin sensation: Secondary | ICD-10-CM | POA: Diagnosis present

## 2011-08-28 DIAGNOSIS — K219 Gastro-esophageal reflux disease without esophagitis: Secondary | ICD-10-CM | POA: Diagnosis present

## 2011-08-28 DIAGNOSIS — I10 Essential (primary) hypertension: Secondary | ICD-10-CM | POA: Diagnosis present

## 2011-08-28 HISTORY — DX: Cardiac arrhythmia, unspecified: I49.9

## 2011-08-28 HISTORY — DX: Inflammatory liver disease, unspecified: K75.9

## 2011-08-28 LAB — CBC
HCT: 40.3 % (ref 36.0–46.0)
Hemoglobin: 13.6 g/dL (ref 12.0–15.0)
MCH: 28 pg (ref 26.0–34.0)
MCHC: 33.7 g/dL (ref 30.0–36.0)
Platelets: 164 10*3/uL (ref 150–400)
RBC: 5.03 MIL/uL (ref 3.87–5.11)
RDW: 13.9 % (ref 11.5–15.5)
WBC: 6.2 10*3/uL (ref 4.0–10.5)

## 2011-08-28 LAB — POCT I-STAT, CHEM 8
Calcium, Ion: 1.29 mmol/L (ref 1.12–1.32)
Chloride: 104 mEq/L (ref 96–112)
HCT: 41 % (ref 36.0–46.0)
Potassium: 4.1 mEq/L (ref 3.5–5.1)
Sodium: 141 mEq/L (ref 135–145)

## 2011-08-28 LAB — RAPID URINE DRUG SCREEN, HOSP PERFORMED
Amphetamines: NOT DETECTED
Barbiturates: NOT DETECTED
Benzodiazepines: NOT DETECTED

## 2011-08-28 LAB — CARDIAC PANEL(CRET KIN+CKTOT+MB+TROPI)
Relative Index: INVALID (ref 0.0–2.5)
Total CK: 61 U/L (ref 7–177)

## 2011-08-28 LAB — CREATININE, SERUM
Creatinine, Ser: 0.56 mg/dL (ref 0.50–1.10)
GFR calc Af Amer: >90 mL/min (ref >90)
GFR calc non Af Amer: >90 mL/min (ref >90)

## 2011-08-28 LAB — BASIC METABOLIC PANEL
BUN: 14 mg/dL (ref 6–23)
Calcium: 10.1 mg/dL (ref 8.4–10.5)
Creatinine, Ser: 0.64 mg/dL (ref 0.50–1.10)
GFR calc Af Amer: >90 mL/min (ref >90)
GFR calc non Af Amer: >90 mL/min (ref >90)

## 2011-08-28 LAB — URINALYSIS, ROUTINE W REFLEX MICROSCOPIC
Hgb urine dipstick: NEGATIVE
Protein, ur: NEGATIVE mg/dL
Urobilinogen, UA: 0.2 mg/dL (ref 0.0–1.0)

## 2011-08-28 LAB — DIFFERENTIAL
Basophils Relative: 1 % (ref 0–1)
Eosinophils Absolute: 0.2 10*3/uL (ref 0.0–0.7)
Monocytes Absolute: 0.5 10*3/uL (ref 0.1–1.0)
Monocytes Relative: 8 % (ref 3–12)

## 2011-08-28 LAB — HEMOGLOBIN A1C: Mean Plasma Glucose: 117 mg/dL — ABNORMAL HIGH (ref <117)

## 2011-08-28 LAB — POCT I-STAT TROPONIN I: Troponin i, poc: 0 ng/mL (ref 0.00–0.08)

## 2011-08-28 LAB — GLUCOSE, CAPILLARY: Glucose-Capillary: 95 mg/dL (ref 70–99)

## 2011-08-28 MED ORDER — SODIUM CHLORIDE 0.9 % IV SOLN
INTRAVENOUS | Status: DC
  Administered 2011-08-29 – 2011-08-30 (×2): via INTRAVENOUS

## 2011-08-28 MED ORDER — SODIUM CHLORIDE 0.9 % IV SOLN: INTRAVENOUS | Status: DC

## 2011-08-28 MED ORDER — ASPIRIN EC 81 MG PO TBEC
81.0000 mg | DELAYED_RELEASE_TABLET | Freq: Every day | ORAL | Status: DC
  Administered 2011-08-29 – 2011-08-30 (×2): 81 mg via ORAL
  Filled 2011-08-28 (×2): qty 1

## 2011-08-28 MED ORDER — PROPRANOLOL HCL ER 80 MG PO CP24
80.0000 mg | ORAL_CAPSULE | Freq: Every day | ORAL | Status: DC
  Administered 2011-08-29 – 2011-08-30 (×2): 80 mg via ORAL
  Filled 2011-08-28 (×2): qty 1

## 2011-08-28 MED ORDER — ATORVASTATIN CALCIUM 20 MG PO TABS
20.0000 mg | ORAL_TABLET | Freq: Every day | ORAL | Status: DC
  Filled 2011-08-28 (×2): qty 1

## 2011-08-28 MED ORDER — LEVOTHYROXINE SODIUM 200 MCG PO TABS
200.0000 ug | ORAL_TABLET | Freq: Every day | ORAL | Status: DC
  Administered 2011-08-29 – 2011-08-30 (×2): 200 ug via ORAL
  Filled 2011-08-28 (×3): qty 1

## 2011-08-28 MED ORDER — SODIUM CHLORIDE 0.9 % IV SOLN
INTRAVENOUS | Status: AC
  Administered 2011-08-28: 12:00:00 via INTRAVENOUS

## 2011-08-28 MED ORDER — CLOBETASOL PROPIONATE 0.05 % EX CREA
1.0000 "application " | TOPICAL_CREAM | Freq: Two times a day (BID) | CUTANEOUS | Status: DC | PRN
  Filled 2011-08-28: qty 15

## 2011-08-28 MED ORDER — ENOXAPARIN SODIUM 40 MG/0.4ML ~~LOC~~ SOLN
40.0000 mg | SUBCUTANEOUS | Status: DC
  Administered 2011-08-28: 40 mg via SUBCUTANEOUS
  Filled 2011-08-28 (×3): qty 0.4

## 2011-08-28 NOTE — ED Notes (Signed)
Code stroke called:1027 Pt arrival:1000 EDP exam: 1025 Stroke team arrival: 1037 Last seen normal: 0745 Pt arrival in ct: 1039 Ct read by neurologist: 1044

## 2011-08-28 NOTE — Telephone Encounter (Signed)
We can address at OV.  I didn't send new Rx.  Per another note, pt was on the way to ER with facial numbness.  FYI.  Thanks.

## 2011-08-28 NOTE — Telephone Encounter (Signed)
Records show that Propranolol was refilled with [facility] 06/23/11 with #90/3. Received refill request from [facility]. Called and spoke to patient and was advised that [facility] and [facility] are the same now. Patient states that the medication was refilled electronically at her office visit and she was informed by her pharmacy that since she was not due it at that time that the script was disposed of. Patient stated that she wanted to let Dr. Para March know that she went to [facility] Friday night and was diagnosed with TIA's. Patient states that she is going to get in touch with her cardiologist Dr. Mariah Milling and follow-up with him. Records were requested from [facility]. Medication refilled.

## 2011-08-28 NOTE — ED Notes (Signed)
cbg 95 

## 2011-08-28 NOTE — ED Notes (Signed)
Rt. Facial numbness began at 08:00 She reports having rt. Side of face is numb and her tongue is numb.  No other neuro deficits noted.  Pt. 's speech is clear. Answers questions appropriately.  Pt does have a soreness lt. Occipital area

## 2011-08-28 NOTE — ED Notes (Signed)
Gave ECG to Dr. Kellie Moor at 10:25am after I performed 10:51 JG. Also CBG was 95 when I checked it 10:51 am JG.

## 2011-08-28 NOTE — Telephone Encounter (Signed)
Patient was contacted by Rene Kocher concerning a refill request and the patient states that she is going to make appt with Dr. Mariah Milling because she is already a Cardiology patient.  Shirlee Limerick is scheduling tests.

## 2011-08-28 NOTE — Code Documentation (Signed)
66 year patient presented with symptoms right side facial numbness.  Reports similar symptoms on Friday but also reported headache left side temporal at that time.  No treatment sought that night.  Woke the next morning with only headache.  Saturday morning experienced similar symptoms but also included some right side weakness and son reported her speech slurred.  Presented to [facility] hospital - patient reports neuro work up with negative CT scan - was told probable TIA -had choice to stay for work up or report to her MD on Monday.  She went home.  Awakened this AM 0645 without symptoms.  At 0745 began to have right facial numbness - no other symptoms.  Reported to [facility] ED via private vehicle at 1000 - examined by EDP at 1025 Code stroke called at 11.  Stroke team arrival at 1037.  LSW 0745.  To CT scan at 1039 - phlebotomy arrival at 1030.  CT read by Dr. Roseanne Reno at 1044.  NIHSS 01.  Code stroke cancelled at 1100.

## 2011-08-28 NOTE — H&P (Signed)
History and Physical Examination  Date: 08/28/2011  Patient name: Kara Wright Medical record number: [id number] Date of birth: [birth year] Age: 66 y.o. Gender: female PCP: Crawford Givens, MD, MD  Attending physician: Cleora Fleet, MD  Chief Complaint:  Chief Complaint  Patient presents with  . Numbness     History of Present Illness: Kara Wright is an 66 y.o. female who states that at 0800 today she began having R lower face and R hand numbness. No weakness, dizziness, visual changes. She was seen in another ED on Friday with similar symptoms in addition to L sided headaches and lack of coordination with R hand. Work up negative and symptoms improved. She has been taking aspirin since.  She has a history of paroxysmal atrial fibrillation, 1 occurrence that resolved and has not been on coumadin but has been on a beta blocker.  She did not recently experience any palpatations.  She has had some improvement in symptoms and was evaluated by neurology and they have recommended that the patient be hospitalized and worked up for TIA event because this is the second occurrence of these symptoms.  The patient denies having chest pain and shortness of breath.  She does not have a known cardiac history.  She reports that her medical problems have been controlled.     Past Medical History Past Medical History  Diagnosis Date  . Diverticulosis 03/1997  . GERD (gastroesophageal reflux disease) 1997    good on prilosec  . Hyperlipidemia 08/1996  . Hypothyroidism 1984  . Osteopenia 11/2002    via dexa  . Atrial fib/flutter, transient     1 episode, resolved, never on coumadin  . Migraine     w/o aura  . Dysrhythmia     atrial fibrilation  . Hepatitis     hx of hep B    Past Surgical History Past Surgical History  Procedure Date  . Polypectomy 04/06/97    colon, benign by pathology  . Partial hysterectomy 1978-79  . Cholecystectomy 07/2006  . 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 Nasolabial fold skin flap surgery 08/02/2007    [facility]  . Varicose vein ablation sclerotherapy 09/2008    Dr. Earnestine Leys  . Abdominal hysterectomy     Home Meds: Prior to Admission medications   Medication Sig Start Date End Date Taking? Authorizing Provider  aspirin EC 81 MG tablet Take 81 mg by mouth daily.   Yes Historical Provider, MD  cholecalciferol (VITAMIN D) 1000 UNITS tablet Take 1,000 Units by mouth. Take two tablets daily    Yes Historical Provider, MD  clobetasol cream (TEMOVATE) 0.05 % Apply 1 application topically 2 (two) times daily as needed. For rash   Yes Historical Provider, MD  levothyroxine (SYNTHROID, LEVOTHROID) 200 MCG tablet Take 200 mcg by mouth daily.   Yes Historical Provider, MD  Multiple Vitamins-Minerals (CENTRUM SILVER ULTRA WOMENS PO) Take by mouth daily.     Yes Historical Provider, MD  promethazine (PHENERGAN) 25 MG tablet Take 25 mg by mouth every 6 (six) hours as needed. For nausea   Yes Historical Provider, MD  propranolol (INDERAL LA) 80 MG 24 hr capsule Take 80 mg by mouth daily.   Yes Historical Provider, MD    Allergies: Celecoxib and Nickel  Social History:  History   Social History  . Marital Status: Married    Spouse Name: N/A    Number of Children: 2  . Years  of Education: N/A   Occupational History  . claims adjuster     [employer]   Social History Main Topics  . Smoking status: Former Smoker    Quit date: 06/26/1986  . Smokeless tobacco: Never Used  . Alcohol Use: No  . Drug Use: No  . Sexually Active: Not Currently   Other Topics Concern  . Not on file   Social History Narrative   Works part time at Dr. Erskin Burnet officeMarried [id number] daughters from prev relationshipUNC fan   Family History:  Family History  Problem Relation Age of Onset  . Hypertension Mother   . Osteopenia Mother   . Stroke Mother     deceased age 50  . Cancer Father     colon, mets, died age 36  .  Colon cancer Father   . Allergies Sister     Review of Systems: A comprehensive review of systems was negative. See HPI All other systems reviewed and reported as negative.   Physical Exam: Blood pressure 145/74, pulse 62, temperature 98 F (36.7 C), temperature source Oral, resp. rate 18, SpO2 97.00%. General appearance: alert, cooperative, appears stated age, no distress and moderately obese Head: Normocephalic, without obvious abnormality, atraumatic Eyes: negative, conjunctivae/corneas clear. PERRL, EOM's intact. Fundi benign. Nose: Nares normal. Septum midline. Mucosa normal. No drainage or sinus tenderness., no discharge Throat: lips, mucosa, and tongue normal; teeth and gums normal Neck: no adenopathy, no carotid bruit, no JVD, supple, symmetrical, trachea midline and thyroid not enlarged, symmetric, no tenderness/mass/nodules Lungs: clear to auscultation bilaterally Heart: S1, S2 normal Abdomen: soft, non-tender; bowel sounds normal; no masses,  no organomegaly Extremities: extremities normal, atraumatic, no cyanosis or edema Pulses: 2+ and symmetric Skin: Skin color, texture, turgor normal. No rashes or lesions Neurologic: Alert and oriented X 3, normal strength and tone. Normal symmetric reflexes. Normal coordination and gait  Lab  And Imaging results:  Results for orders placed during the hospital encounter of 08/28/11 (from the past 24 hour(s))  GLUCOSE, CAPILLARY     Status: Normal   Collection Time   08/28/11 10:34 AM      Component Value Range   Glucose-Capillary 95  70 - 99 (mg/dL)  CBC     Status: Normal   Collection Time   08/28/11 10:37 AM      Component Value Range   WBC 6.8  4.0 - 10.5 (K/uL)   RBC 4.86  3.87 - 5.11 (MIL/uL)   Hemoglobin 13.6  12.0 - 15.0 (g/dL)   HCT 62.1  30.8 - 65.7 (%)   MCV 82.9  78.0 - 100.0 (fL)   MCH 28.0  26.0 - 34.0 (pg)   MCHC 33.7  30.0 - 36.0 (g/dL)   RDW 84.6  96.2 - 95.2 (%)   Platelets 164  150 - 400 (K/uL)  DIFFERENTIAL      Status: Normal   Collection Time   08/28/11 10:37 AM      Component Value Range   Neutrophils Relative 51  43 - 77 (%)   Neutro Abs 3.5  1.7 - 7.7 (K/uL)   Lymphocytes Relative 38  12 - 46 (%)   Lymphs Abs 2.6  0.7 - 4.0 (K/uL)   Monocytes Relative 8  3 - 12 (%)   Monocytes Absolute 0.5  0.1 - 1.0 (K/uL)   Eosinophils Relative 3  0 - 5 (%)   Eosinophils Absolute 0.2  0.0 - 0.7 (K/uL)   Basophils Relative 1  0 - 1 (%)  Basophils Absolute 0.0  0.0 - 0.1 (K/uL)  BASIC METABOLIC PANEL     Status: Normal   Collection Time   08/28/11 10:37 AM      Component Value Range   Sodium 140  135 - 145 (mEq/L)   Potassium 4.0  3.5 - 5.1 (mEq/L)   Chloride 104  96 - 112 (mEq/L)   CO2 28  19 - 32 (mEq/L)   Glucose, Bld 80  70 - 99 (mg/dL)   BUN 14  6 - 23 (mg/dL)   Creatinine, Ser 4.09  0.50 - 1.10 (mg/dL)   Calcium 81.1  8.4 - 10.5 (mg/dL)   GFR calc non Af Amer >90  >90 (mL/min)   GFR calc Af Amer >90  >90 (mL/min)  PROTIME-INR     Status: Normal   Collection Time   08/28/11 10:37 AM      Component Value Range   Prothrombin Time 13.5  11.6 - 15.2 (seconds)   INR 1.01  0.00 - 1.49   APTT     Status: Normal   Collection Time   08/28/11 10:37 AM      Component Value Range   aPTT 28  24 - 37 (seconds)  CARDIAC PANEL(CRET KIN+CKTOT+MB+TROPI)     Status: Normal   Collection Time   08/28/11 10:37 AM      Component Value Range   Total CK 61  7 - 177 (U/L)   CK, MB 1.5  0.3 - 4.0 (ng/mL)   Troponin I <0.30  <0.30 (ng/mL)   Relative Index RELATIVE INDEX IS INVALID  0.0 - 2.5   POCT I-STAT TROPONIN I     Status: Normal   Collection Time   08/28/11 10:43 AM      Component Value Range   Troponin i, poc 0.00  0.00 - 0.08 (ng/mL)   Comment 3           POCT I-STAT, CHEM 8     Status: Normal   Collection Time   08/28/11 10:46 AM      Component Value Range   Sodium 141  135 - 145 (mEq/L)   Potassium 4.1  3.5 - 5.1 (mEq/L)   Chloride 104  96 - 112 (mEq/L)   BUN 15  6 - 23 (mg/dL)    Creatinine, Ser 9.14  0.50 - 1.10 (mg/dL)   Glucose, Bld 77  70 - 99 (mg/dL)   Calcium, Ion 7.82  9.56 - 1.32 (mmol/L)   TCO2 28  0 - 100 (mmol/L)   Hemoglobin 13.9  12.0 - 15.0 (g/dL)   HCT 21.3  08.6 - 57.8 (%)  URINALYSIS, ROUTINE W REFLEX MICROSCOPIC     Status: Normal   Collection Time   08/28/11 11:12 AM      Component Value Range   Color, Urine YELLOW  YELLOW    APPearance CLEAR  CLEAR    Specific Gravity, Urine 1.011  1.005 - 1.030    pH 6.0  5.0 - 8.0    Glucose, UA NEGATIVE  NEGATIVE (mg/dL)   Hgb urine dipstick NEGATIVE  NEGATIVE    Bilirubin Urine NEGATIVE  NEGATIVE    Ketones, ur NEGATIVE  NEGATIVE (mg/dL)   Protein, ur NEGATIVE  NEGATIVE (mg/dL)   Urobilinogen, UA 0.2  0.0 - 1.0 (mg/dL)   Nitrite NEGATIVE  NEGATIVE    Leukocytes, UA NEGATIVE  NEGATIVE    EKG Results:  Orders placed during the hospital encounter of 08/28/11  . ED EKG  . ED  EKG     Impression   TIA (transient ischemic attack)  HYPOTHYROIDISM  HYPERLIPIDEMIA  GERD  Paroxysmal Atrial fibrillation  HTN (hypertension)   Plan  Admit under TIA protocol, Check echo, carotid dopplers, fasting lipids, telemetry, PT/OT consult, Neurology consult, Please see detailed orders.    Standley Dakins MD [facility] [place], [place] [phone] 08/28/2011, 4:41 PM

## 2011-08-28 NOTE — Consult Note (Signed)
Chief Complaint: Right facial and upper extremity numbness  HPI: Kara Wright is an 66 y.o. female history of hyperlipidemia and history of a single episode of atrial fibrillation without recurrence, presenting with intermittent numbness involving right side of her face and right upper extremity since 08/25/2011. She sought medical attention in the emergency room at [facility] on 08/26/2011 because of headache as well as numbness over the right side of her face and right arm. Workup is unremarkable including CT scan of her head. It was felt that she may well have had a transient ischemic attack and further workup was recommended. Patient elected to undergo outpatient workup. She was given aspirin 81 mg per day. Today's symptoms started at about 7:50 this morning. She has not had focal weakness. She reportedly had slurred speech on 08/26/2011. She had no speech changes today. NIH stroke score was 1 for persistent mild numbness involving face and right lower extremity. CT scan of her head was negative for acute intracranial abnormality.  LSN: [id number] today  tPA Given: No: Minor deficits MRankin: 0  Past Medical History  Diagnosis Date  . Diverticulosis 03/1997  . GERD (gastroesophageal reflux disease) 1997    good on prilosec  . Hyperlipidemia 08/1996  . Hypothyroidism 1984  . Osteopenia 11/2002    via dexa  . Atrial fib/flutter, transient     1 episode, resolved, never on coumadin  . Migraine     w/o aura    Family History  Problem Relation Age of Onset  . Hypertension Mother   . Osteopenia Mother   . Stroke Mother     deceased age 69  . Cancer Father     colon, mets, died age 9  . Colon cancer Father   . Allergies Sister      Medications:  Prior to Admission:  Vitamin D 1000 units 2 tablets per day Temovate 5% cream twice a day when necessary Synthroid 200 mg per day Multivitamin 1 per day Phenergan 25 mg every 6 hours when necessary nausea Inderal  LA 80 mg per day  Physical Examination: Blood pressure 149/72, pulse 67, temperature 97.9 F (36.6 C), temperature source Oral, resp. rate 19, SpO2 98.00%.  Neurologic Examination: Mental Status: Alert, oriented, thought content appropriate.  Speech fluent without evidence of aphasia. Able to follow commands without difficulty. Cranial Nerves: II-Visual fields grossly intact. III/IV/VI-Extraocular movements intact.  Pupils reactive bilaterally. V/VII-mild right facial numbness; no facial weakness VIII-grossly intact X-normal speech XII-midline tongue extension Motor: 5/5 bilaterally with normal tone and bulk Sensory: Pinprick and light touch intact throughout, bilaterally, except for mild numbness involving the right leg compared to left. Deep Tendon Reflexes: 2+ and symmetric throughout Plantars: Downgoing bilaterally Cerebellar: Normal finger-to-nose and normal heel-to-shin test.   Ct Head Wo Contrast  08/28/2011  *RADIOLOGY REPORT*  Clinical Data: Code stroke  CT HEAD WITHOUT CONTRAST  Technique:  Contiguous axial images were obtained from the base of the skull through the vertex without contrast.  Comparison: None.  Findings: There is no evidence for acute hemorrhage, hydrocephalus, mass lesion, or abnormal extra-axial fluid collection.  No definite CT evidence for acute infarction.  The visualized paranasal sinuses and mastoid air cells are clear.  IMPRESSION: No acute intracranial abnormality.  I personally called these results to Dr. Roseanne Reno at 1058 hours on 08/28/2011.  Original Report Authenticated By: ERIC A. MANSELL, M.D.    Assessment: 66 y.o. female  with recurrent symptoms of numbness and tingling involving right face and  right upper extremity. Recurrent TIAs cannot be ruled out. As well, small subcortical left cerebral infarction cannot be ruled out.  Stroke Risk Factors - family history and hyperlipidemia  Plan: 1. HgbA1c, fasting lipid panel 2. MRI, MRA  of the brain  without contrast 3. PT consult, OT consult, Speech consult 4. Echocardiogram 5. Carotid dopplers 6. Prophylactic therapy-Antiplatelet med: Aspirin - dose 81 mg per day  7. Risk factor modification 8. Telemetry monitoring  C.R. Roseanne Reno, MD [facility] [phone]   08/28/2011, 11:20 AM

## 2011-08-28 NOTE — ED Notes (Signed)
Pt states initially on Friday she began having a headache and right sided weakness.  Pt went to [facility] ed on Saturday and states she had a TIA.

## 2011-08-28 NOTE — Telephone Encounter (Signed)
Spoke to patient to give her the appointments for 2D Echo and Carotid and appt with Dr Mariah Milling and she said she was on the way to the [facility] ER because she is having facial numbness now. Her daughter will call us later today to let us know if she was admitted to [facility] or not.

## 2011-08-28 NOTE — Telephone Encounter (Signed)
Noted  

## 2011-08-28 NOTE — ED Notes (Signed)
Pt coming from home where at 0800 she began having right sided facial numbness.  Pt is moving all extremities, no slurred speech, no arm drift.

## 2011-08-28 NOTE — ED Provider Notes (Signed)
History     CSN: [id number]  Arrival date & time 08/28/11  1002   First MD Initiated Contact with Patient 08/28/11 1026      Chief Complaint  Patient presents with  . Numbness    (Consider location/radiation/quality/duration/timing/severity/associated sxs/prior treatment) HPI Pt states that at 0800 today she began having R lower face and R hand numbness. No weakness, dizziness, visual changes. She was seen in another ED on Friday with similar symptoms in addition to L sided headaches and lack of coordination with R hand. Work up negative and symptoms improved. She has been taking aspirin since.    Past Medical History  Diagnosis Date  . Diverticulosis 03/1997  . GERD (gastroesophageal reflux disease) 1997    good on prilosec  . Hyperlipidemia 08/1996  . Hypothyroidism 1984  . Osteopenia 11/2002    via dexa  . Atrial fib/flutter, transient     1 episode, resolved, never on coumadin  . Migraine     w/o aura    Past Surgical History  Procedure Date  . Polypectomy 04/06/97    colon, benign by pathology  . Partial hysterectomy 1978-79  . Cholecystectomy 07/2006  . 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 Nasolabial fold skin flap surgery 08/02/2007    [facility]  . Varicose vein ablation sclerotherapy 09/2008    Dr. Earnestine Leys    Family History  Problem Relation Age of Onset  . Hypertension Mother   . Osteopenia Mother   . Stroke Mother     deceased age 10  . Cancer Father     colon, mets, died age 32  . Colon cancer Father   . Allergies Sister     History  Substance Use Topics  . Smoking status: Former Smoker    Quit date: 06/26/1986  . Smokeless tobacco: Not on file  . Alcohol Use: No    OB History    Grav Para Term Preterm Abortions TAB SAB Ect Mult Living                  Review of Systems  Constitutional: Negative for fever and chills.  HENT: Negative for neck pain.   Respiratory: Negative for shortness of breath.     Cardiovascular: Negative for chest pain.  Gastrointestinal: Negative for nausea, vomiting and abdominal pain.  Neurological: Positive for numbness and headaches. Negative for dizziness, speech difficulty and weakness.    Allergies  Celecoxib and Nickel  Home Medications   Current Outpatient Rx  Name Route Sig Dispense Refill  . ASPIRIN EC 81 MG PO TBEC Oral Take 81 mg by mouth daily.    Marland Kitchen VITAMIN D 1000 UNITS PO TABS Oral Take 1,000 Units by mouth. Take two tablets daily     . CLOBETASOL PROPIONATE 0.05 % EX CREA Topical Apply 1 application topically 2 (two) times daily as needed. For rash    . LEVOTHYROXINE SODIUM 200 MCG PO TABS Oral Take 200 mcg by mouth daily.    . CENTRUM SILVER ULTRA WOMENS PO Oral Take by mouth daily.      Marland Kitchen PROMETHAZINE HCL 25 MG PO TABS Oral Take 25 mg by mouth every 6 (six) hours as needed. For nausea    . PROPRANOLOL HCL ER 80 MG PO CP24 Oral Take 80 mg by mouth daily.      BP 149/72  Pulse 67  Temp(Src) 97.9 F (36.6 C) (Oral)  Resp 19  SpO2 98%  Physical Exam  Nursing note and vitals reviewed. Constitutional: She is oriented to person, place, and time. She appears well-developed and well-nourished. No distress.  HENT:  Head: Normocephalic and atraumatic.  Mouth/Throat: Oropharynx is clear and moist.  Eyes: EOM are normal. Pupils are equal, round, and reactive to light.  Neck: Normal range of motion. Neck supple.  Cardiovascular: Normal rate and regular rhythm.   Pulmonary/Chest: Effort normal and breath sounds normal. No respiratory distress. She has no wheezes. She has no rales.  Abdominal: Soft. Bowel sounds are normal. There is no tenderness. There is no rebound.  Musculoskeletal: Normal range of motion. She exhibits no edema and no tenderness.  Neurological: She is alert and oriented to person, place, and time.       R lower face numbness and R hand numbness. Otherwise CN II-XII intact. 5/5 motor in all ext. Decreased sensation over dorsum  of R hand. Normal grip strength.   Skin: Skin is warm and dry. No rash noted. No erythema.  Psychiatric: She has a normal mood and affect. Her behavior is normal.    ED Course  Procedures (including critical care time)   Labs Reviewed  CBC  DIFFERENTIAL  BASIC METABOLIC PANEL  PROTIME-INR  APTT  CARDIAC PANEL(CRET KIN+CKTOT+MB+TROPI)  GLUCOSE, CAPILLARY  POCT I-STAT, CHEM 8  POCT I-STAT TROPONIN I  URINALYSIS, ROUTINE W REFLEX MICROSCOPIC   Ct Head Wo Contrast  08/28/2011  *RADIOLOGY REPORT*  Clinical Data: Code stroke  CT HEAD WITHOUT CONTRAST  Technique:  Contiguous axial images were obtained from the base of the skull through the vertex without contrast.  Comparison: None.  Findings: There is no evidence for acute hemorrhage, hydrocephalus, mass lesion, or abnormal extra-axial fluid collection.  No definite CT evidence for acute infarction.  The visualized paranasal sinuses and mastoid air cells are clear.  IMPRESSION: No acute intracranial abnormality.  I personally called these results to Dr. Roseanne Reno at 1058 hours on 08/28/2011.  Original Report Authenticated By: ERIC A. MANSELL, M.D.     1. TIA (transient ischemic attack)      Date: 08/28/2011  Rate: 64  Rhythm: normal sinus rhythm  QRS Axis: normal  Intervals: normal  ST/T Wave abnormalities: normal  Conduction Disutrbances:none  Narrative Interpretation:   Old EKG Reviewed: none available    MDM  Code stroke called after initial assessment.    Dr Roseanne Reno saw pt in ED. No tPA. [facility] for T[facility] workup. [facility] accepted pt and asked for temp orders to be placed  Loren Racer, MD 08/28/11 1139

## 2011-08-29 ENCOUNTER — Ambulatory Visit: Payer: BC Managed Care – PPO | Admitting: Family Medicine

## 2011-08-29 ENCOUNTER — Inpatient Hospital Stay (HOSPITAL_COMMUNITY): Payer: BC Managed Care – PPO

## 2011-08-29 ENCOUNTER — Other Ambulatory Visit: Payer: BC Managed Care – PPO

## 2011-08-29 DIAGNOSIS — Z0289 Encounter for other administrative examinations: Secondary | ICD-10-CM

## 2011-08-29 LAB — CARDIAC PANEL(CRET KIN+CKTOT+MB+TROPI)
CK, MB: 1.3 ng/mL (ref 0.3–4.0)
Relative Index: INVALID (ref 0.0–2.5)
Total CK: 53 U/L (ref 7–177)
Troponin I: <0.3 ng/mL (ref <0.30)
Troponin I: <0.3 ng/mL (ref <0.30)

## 2011-08-29 LAB — LIPID PANEL
Cholesterol: 181 mg/dL (ref 0–200)
HDL: 37 mg/dL — ABNORMAL LOW (ref >39)
Triglycerides: 309 mg/dL — ABNORMAL HIGH (ref <150)

## 2011-08-29 MED ORDER — LORAZEPAM 2 MG/ML IJ SOLN
INTRAMUSCULAR | Status: AC
  Administered 2011-08-29: 1 mg via INTRAVENOUS
  Filled 2011-08-29: qty 1

## 2011-08-29 MED ORDER — LORAZEPAM 2 MG/ML IJ SOLN
1.0000 mg | Freq: Once | INTRAMUSCULAR | Status: AC
  Administered 2011-08-29: 1 mg via INTRAVENOUS

## 2011-08-29 NOTE — Evaluation (Signed)
Occupational Therapy Evaluation Patient Details Name: Kara Wright MRN: [id number] DOB: 1945/11/30 Today's Date: 08/29/2011  Problem List:  Patient Active Problem List  Diagnoses  . HYPOTHYROIDISM  . HYPERLIPIDEMIA  . MIGRAINE HEADACHE  . GLAUCOMA  . VARICOSE VEINS, LOWER EXTREMITIES  . GERD  . DIVERTICULOSIS, COLON  . COLONIC POLYPS, ADENOMATOUS, BENIGN  . OSTEOPENIA  . CLOSED FRACTURE OF FOUR RIBS  . Atrial fibrillation  . HTN (hypertension)  . Obesity  . Routine general medical examination at a health care facility  . URI (upper respiratory infection)  . TIA (transient ischemic attack)    Past Medical History:  Past Medical History  Diagnosis Date  . Diverticulosis 03/1997  . GERD (gastroesophageal reflux disease) 1997    good on prilosec  . Hyperlipidemia 08/1996  . Hypothyroidism 1984  . Osteopenia 11/2002    via dexa  . Atrial fib/flutter, transient     1 episode, resolved, never on coumadin  . Migraine     w/o aura  . Dysrhythmia     atrial fibrilation  . Hepatitis     hx of hep B   Past Surgical History:  Past Surgical History  Procedure Date  . Polypectomy 04/06/97    colon, benign by pathology  . Partial hysterectomy 1978-79  . Cholecystectomy 07/2006  . 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 Nasolabial fold skin flap surgery 08/02/2007    [facility]  . Varicose vein ablation sclerotherapy 09/2008    Dr. Earnestine Leys  . Abdominal hysterectomy     OT Assessment/Plan/Recommendation OT Assessment Clinical Impression Statement: This 66 y.o. female admitted for possible CVA, NIH stroke scale score of 1.  Pt. appears to be back to baseline with no deficits noted. Pt. is independent with BADLs.  Pt. was instructed in signs and symptoms of CVA.  No further OT needs, will sign off. OT Recommendation/Assessment: Patient does not need any further OT services OT Recommendation Follow Up Recommendations: No OT follow up Equipment  Recommended: None recommended by OT OT Goals    OT Evaluation Precautions/Restrictions  Restrictions Weight Bearing Restrictions: No Prior Functioning Home Living Lives With: Spouse Type of Home: House Home Layout: One level Home Access: Stairs to enter Entrance Stairs-Rails: Lawyer of Steps: 2 Bathroom Shower/Tub: Tub/shower unit;Walk-in shower Bathroom Toilet: Standard Bathroom Accessibility: Yes How Accessible: Accessible via walker Home Adaptive Equipment: None Prior Function Level of Independence: Independent with basic ADLs Able to Take Stairs?: Yes Driving: Yes 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 Eating/Feeding: Simulated;Independent Where Assessed - Eating/Feeding: Edge of bed Grooming: Performed;Wash/dry hands;Independent Where Assessed - Grooming: Standing at sink Upper Body Bathing: Simulated;Independent Where Assessed - Upper Body Bathing: Sitting, bed;Unsupported Lower Body Bathing: Simulated;Independent Where Assessed - Lower Body Bathing: Sit to stand from chair;Sit to stand from bed Upper Body Dressing: Simulated;Independent Where Assessed - Upper Body Dressing: Unsupported;Sitting, chair;Sitting, bed Lower Body Dressing: Simulated;Independent Where Assessed - Lower Body Dressing: Sit to stand from chair;Sit to stand from bed Toilet Transfer: Independent;Performed Toilet Transfer Method: Proofreader: Comfort height toilet Toileting - Clothing Manipulation: Performed;Independent Where Assessed - Toileting Clothing Manipulation: Standing Toileting - Hygiene: Performed;Independent Where Assessed - Toileting Hygiene: Sit on 3-in-1 or toilet Ambulation Related to ADLs: Pt. ambulating indpendently in room ADL Comments: Pt. required assistance only for IV line and monitor Vision/Perception  Vision - History Baseline Vision: Wears glasses all the time Patient Visual  Report:  No change from baseline (Pt. reports visual changes have improved) Vision - Assessment Eye Alignment: Within Functional Limits Vision Assessment: Vision tested Ocular Range of Motion: Within Functional Limits Tracking/Visual Pursuits: Able to track stimulus in all quads without difficulty Saccades: Within functional limits Visual Fields: No apparent deficits Perception Perception: Within Functional Limits Praxis Praxis: Intact Cognition Cognition Arousal/Alertness: Awake/alert Overall Cognitive Status: Appears within functional limits for tasks assessed Orientation Level: Oriented X4 Sensation/Coordination Sensation Light Touch: Appears Intact Proprioception: Appears Intact Coordination Gross Motor Movements are Fluid and Coordinated: Yes Fine Motor Movements are Fluid and Coordinated: Yes Finger Nose Finger Test: [facility] Extremity Assessment RUE Assessment RUE Assessment: Within Functional Limits LUE Assessment LUE Assessment: Within Functional Limits Mobility  Bed Mobility Bed Mobility: Yes Supine to Sit: 7: Independent Sit to Supine: 7: Independent Transfers Transfers: Yes Sit to Stand: 7: Independent Stand to Sit: 7: Independent Exercises Other Exercises Other Exercises: Discussed slow return to exercise program, encouragement to continue with exercise to decrease risk for further CVA/TIAs.  End of Session OT - End of Session Activity Tolerance: Patient tolerated treatment well Patient left: in bed;with call bell in reach General Behavior During Session: [facility] for tasks performed Cognition: [facility] for tasks performed   Tye Vigo M 08/29/2011, 2:01 PM

## 2011-08-29 NOTE — Evaluation (Signed)
Physical Therapy Evaluation Patient Details Name: LAVINE HARGROVE MRN: [id number] DOB: 1946/03/14 Today's Date: 08/29/2011  Problem List:  Patient Active Problem List  Diagnoses  . HYPOTHYROIDISM  . HYPERLIPIDEMIA  . MIGRAINE HEADACHE  . GLAUCOMA  . VARICOSE VEINS, LOWER EXTREMITIES  . GERD  . DIVERTICULOSIS, COLON  . COLONIC POLYPS, ADENOMATOUS, BENIGN  . OSTEOPENIA  . CLOSED FRACTURE OF FOUR RIBS  . Atrial fibrillation  . HTN (hypertension)  . Obesity  . Routine general medical examination at a health care facility  . URI (upper respiratory infection)  . TIA (transient ischemic attack)    Past Medical History:  Past Medical History  Diagnosis Date  . Diverticulosis 03/1997  . GERD (gastroesophageal reflux disease) 1997    good on prilosec  . Hyperlipidemia 08/1996  . Hypothyroidism 1984  . Osteopenia 11/2002    via dexa  . Atrial fib/flutter, transient     1 episode, resolved, never on coumadin  . Migraine     w/o aura  . Dysrhythmia     atrial fibrilation  . Hepatitis     hx of hep B   Past Surgical History:  Past Surgical History  Procedure Date  . Polypectomy 04/06/97    colon, benign by pathology  . Partial hysterectomy 1978-79  . Cholecystectomy 07/2006  . 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 Nasolabial fold skin flap surgery 08/02/2007    [facility]  . Varicose vein ablation sclerotherapy 09/2008    Dr. Earnestine Leys  . Abdominal hysterectomy     PT Assessment/Plan/Recommendation PT Assessment Clinical Impression Statement: 66 year old female admitted for Rt. face and Rt. hand numbness, suspected to have had a TIA. This is the second event for the pt. During PT session pt was educated on signs/symptoms of stroke and TIA, stressed need to seek medical care immediately. Pt verbalized understanding. Pt presents with great mobility, does not present as a high fall risk (24/24 on Dynamic Gait Index (<19 indicates increased risk for  falls)). Pt reports she feels she is at baseline, feels no f/u PT needed. PT in agreement. Will sign off. PT Recommendation/Assessment: Patent does not need any further PT services No Skilled PT: Patient is modified independent with all activity/mobility;Patient at baseline level of functioning PT Recommendation Follow Up Recommendations: No PT follow up Equipment Recommended: None recommended by PT PT Goals   none  PT Evaluation Precautions/Restrictions  Restrictions Weight Bearing Restrictions: No Prior Functioning  Home Living Lives With: Spouse Type of Home: House Home Layout: One level Home Access: Stairs to enter Entrance Stairs-Rails: Lawyer of Steps: 2 Bathroom Shower/Tub: Tub/shower unit;Walk-in shower (usually use the walk-in) Bathroom Toilet: Standard Bathroom Accessibility: Yes How Accessible: Accessible via walker Home Adaptive Equipment: None Prior Function Level of Independence: Independent with basic ADLs Driving: Yes Vocation: Part time employment Comments: Checks pt's in at work and checks glasses. Cognition Cognition Arousal/Alertness: Awake/alert Overall Cognitive Status: Appears within functional limits for tasks assessed Orientation Level: Oriented X4 Sensation/Coordination Sensation Light Touch: Appears Intact (Bil. LEs) Proprioception: Appears Intact Coordination Gross Motor Movements are Fluid and Coordinated: Yes Fine Motor Movements are Fluid and Coordinated: Yes Extremity Assessment RLE Assessment RLE Assessment: Within Functional Limits LLE Assessment LLE Assessment: Within Functional Limits Mobility (including Balance) Bed Mobility Bed Mobility: Yes Supine to Sit: 6: Modified independent (Device/Increase time) Sit to Supine: 6: Modified independent (Device/Increase time) Transfers Transfers: Yes Sit to Stand: 6: Modified independent (Device/Increase time)  Stand to Sit: 6: Modified independent  (Device/Increase time) Ambulation/Gait Ambulation/Gait: Yes Ambulation/Gait Assistance: 5: Supervision;6: Modified independent (Device/Increase time) Ambulation/Gait Assistance Details (indicate cue type and reason): Supervision initially progressing to modified independent. Slight increase in lateral sway with no overt losses of balance. Pt needed no physical assistance for balance.  Ambulation Distance (Feet): 250 Feet Assistive device: None Gait Pattern: Within Functional Limits (slight wide base of support) Stairs: Yes Stairs Assistance: 6: Modified independent (Device/Increase time) Stair Management Technique: No rails;Alternating pattern Number of Stairs: 4   Posture/Postural Control Posture/Postural Control: No significant limitations Balance Balance Assessed: Yes Dynamic Gait Index Level Surface: Normal Change in Gait Speed: Normal Gait with Horizontal Head Turns: Normal Gait with Vertical Head Turns: Normal Gait and Pivot Turn: Normal Step Over Obstacle: Normal Step Around Obstacles: Normal Steps: Normal Total Score: 24  Rhomberg eyes closed;  >30 sec Exercise  Other Exercises Other Exercises: Discussed slow return to exercise program, encouragement to continue with exercise to decrease risk for further CVA/TIAs.  End of Session PT - End of Session Equipment Utilized During Treatment: Gait belt Activity Tolerance: Patient tolerated treatment well Patient left: in bed;with call bell in reach Nurse Communication: Mobility status for ambulation General Behavior During Session: [facility] for tasks performed Cognition: [facility] for tasks performed  Wilhemina Bonito 08/29/2011, 12:26 PM  Sherie Don) Carleene Mains PT, DPT Acute Rehabilitation [phone]

## 2011-08-29 NOTE — Progress Notes (Signed)
[facility] Inpatient Progress Note  08/29/2011  Subjective: Pt says that her symptoms have resolved now.  She is due for MRI MRA this morning and has some concern about claustrophobia.   Objective:  Vital signs in last 24 hours: Filed Vitals:   08/28/11 2200 08/29/11 0200 08/29/11 0600 08/29/11 1010  BP: 110/71 110/72 112/60 124/78  Pulse: 62 68 65 65  Temp: 98.2 F (36.8 C) 97.7 F (36.5 C) 97.8 F (36.6 C) 97.6 F (36.4 C)  TempSrc:      Resp: 18 18 18 20   Height:      Weight:      SpO2: 91% 95% 95% 96%   Weight change:   Intake/Output Summary (Last 24 hours) at 08/29/11 1059 Last data filed at 08/29/11 1610  Gross per 24 hour  Intake    455 ml  Output    500 ml  Net    -45 ml   Lab Results  Component Value Date   HGBA1C 5.7* 08/28/2011   Lab Results  Component Value Date   LDLCALC 82 08/29/2011   CREATININE 0.56 08/28/2011    Review of Systems As above, otherwise all reviewed and reported negative  Physical Exam General appearance: alert, cooperative, appears stated age, no distress and moderately obese  Head: Normocephalic, without obvious abnormality, atraumatic  Eyes: negative, conjunctivae/corneas clear. PERRL, EOM's intact. Fundi benign.  Nose: Nares normal. Septum midline. Mucosa normal. No drainage or sinus tenderness., no discharge  Throat: lips, mucosa, and tongue normal; teeth and gums normal  Neck: no adenopathy, no carotid bruit, no JVD, supple, symmetrical, trachea midline and thyroid not enlarged, symmetric, no tenderness/mass/nodules  Lungs: clear to auscultation bilaterally  Heart: S1, S2 normal  Abdomen: soft, non-tender; bowel sounds normal; no masses, no organomegaly  Extremities: extremities normal, atraumatic, no cyanosis or edema  Pulses: 2+ and symmetric  Skin: Skin color, texture, turgor normal. No rashes or lesions  Neurologic: Alert and oriented X 3, normal strength and tone. Normal symmetric reflexes. Normal coordination  and gait   Lab Results: Results for orders placed during the hospital encounter of 08/28/11 (from the past 24 hour(s))  URINALYSIS, ROUTINE W REFLEX MICROSCOPIC     Status: Normal   Collection Time   08/28/11 11:12 AM      Component Value Range   Color, Urine YELLOW  YELLOW    APPearance CLEAR  CLEAR    Specific Gravity, Urine 1.011  1.005 - 1.030    pH 6.0  5.0 - 8.0    Glucose, UA NEGATIVE  NEGATIVE (mg/dL)   Hgb urine dipstick NEGATIVE  NEGATIVE    Bilirubin Urine NEGATIVE  NEGATIVE    Ketones, ur NEGATIVE  NEGATIVE (mg/dL)   Protein, ur NEGATIVE  NEGATIVE (mg/dL)   Urobilinogen, UA 0.2  0.0 - 1.0 (mg/dL)   Nitrite NEGATIVE  NEGATIVE    Leukocytes, UA NEGATIVE  NEGATIVE   HEMOGLOBIN A1C     Status: Abnormal   Collection Time   08/28/11  5:14 PM      Component Value Range   Hemoglobin A1C 5.7 (*) <5.7 (%)   Mean Plasma Glucose 117 (*) <117 (mg/dL)  CBC     Status: Normal   Collection Time   08/28/11  5:14 PM      Component Value Range   WBC 6.2  4.0 - 10.5 (K/uL)   RBC 5.03  3.87 - 5.11 (MIL/uL)   Hemoglobin 14.1  12.0 - 15.0 (g/dL)   HCT 41.5  36.0 - 46.0 (%)   MCV 82.5  78.0 - 100.0 (fL)   MCH 28.0  26.0 - 34.0 (pg)   MCHC 34.0  30.0 - 36.0 (g/dL)   RDW 16.1  09.6 - 04.5 (%)   Platelets 164  150 - 400 (K/uL)  CREATININE, SERUM     Status: Normal   Collection Time   08/28/11  5:14 PM      Component Value Range   Creatinine, Ser 0.56  0.50 - 1.10 (mg/dL)   GFR calc non Af Amer >90  >90 (mL/min)   GFR calc Af Amer >90  >90 (mL/min)  CARDIAC PANEL(CRET KIN+CKTOT+MB+TROPI)     Status: Normal   Collection Time   08/28/11  5:14 PM      Component Value Range   Total CK 57  7 - 177 (U/L)   CK, MB 1.5  0.3 - 4.0 (ng/mL)   Troponin I <0.30  <0.30 (ng/mL)   Relative Index RELATIVE INDEX IS INVALID  0.0 - 2.5   URINE RAPID DRUG SCREEN (HOSP PERFORMED)     Status: Normal   Collection Time   08/28/11  7:42 PM      Component Value Range   Opiates NONE DETECTED  NONE DETECTED     Cocaine NONE DETECTED  NONE DETECTED    Benzodiazepines NONE DETECTED  NONE DETECTED    Amphetamines NONE DETECTED  NONE DETECTED    Tetrahydrocannabinol NONE DETECTED  NONE DETECTED    Barbiturates NONE DETECTED  NONE DETECTED   LIPID PANEL     Status: Abnormal   Collection Time   08/29/11 12:40 AM      Component Value Range   Cholesterol 181  0 - 200 (mg/dL)   Triglycerides 409 (*) <150 (mg/dL)   HDL 37 (*) >81 (mg/dL)   Total CHOL/HDL Ratio 4.9     VLDL 62 (*) 0 - 40 (mg/dL)   LDL Cholesterol 82  0 - 99 (mg/dL)  CARDIAC PANEL(CRET KIN+CKTOT+MB+TROPI)     Status: Normal   Collection Time   08/29/11 12:40 AM      Component Value Range   Total CK 52  7 - 177 (U/L)   CK, MB 1.3  0.3 - 4.0 (ng/mL)   Troponin I <0.30  <0.30 (ng/mL)   Relative Index RELATIVE INDEX IS INVALID  0.0 - 2.5   CARDIAC PANEL(CRET KIN+CKTOT+MB+TROPI)     Status: Normal   Collection Time   08/29/11  9:00 AM      Component Value Range   Total CK 53  7 - 177 (U/L)   CK, MB 1.3  0.3 - 4.0 (ng/mL)   Troponin I <0.30  <0.30 (ng/mL)   Relative Index RELATIVE INDEX IS INVALID  0.0 - 2.5     Micro Results: No results found for this or any previous visit (from the past 240 hour(s)).  Medications:  Scheduled Meds:   . sodium chloride   Intravenous STAT  . aspirin EC  81 mg Oral Daily  . atorvastatin  20 mg Oral q1800  . enoxaparin  40 mg Subcutaneous Q24H  . levothyroxine  200 mcg Oral Q0600  . LORazepam  1 mg Intravenous Once  . propranolol  80 mg Oral Daily  . DISCONTD: sodium chloride   Intravenous STAT   Continuous Infusions:   . sodium chloride 75 mL/hr at 08/29/11 0135   PRN Meds:.clobetasol cream  Impression  TIA (transient ischemic attack)  HYPOTHYROIDISM  HYPERLIPIDEMIA  GERD  Paroxysmal  Atrial fibrillation  HTN (hypertension)   Plan  Pt for MRI/MRA this morning. Appreciate neurology assistance.  Awaiting TSH, still pending.  ECHO pending, carotid dopplers pending.  Lipids, well  controlled at this time, PT/OT consult pending.  Continue to follow.  Lorazepam 1mg  IV prior to MRI procedure.  BPs well controlled.  Telemetry is Normal Sinus so far, still monitoring.  No recent recurrence of afib seen.     LOS: 1 day   Cedrica Brune 08/29/2011, 10:59 AM   Cleora Fleet, MD, CDE, FAAFP [facility] [place], [place]  [phone]

## 2011-08-30 DIAGNOSIS — M2669 Other specified disorders of temporomandibular joint: Secondary | ICD-10-CM | POA: Diagnosis present

## 2011-08-30 DIAGNOSIS — G459 Transient cerebral ischemic attack, unspecified: Secondary | ICD-10-CM

## 2011-08-30 DIAGNOSIS — I6350 Cerebral infarction due to unspecified occlusion or stenosis of unspecified cerebral artery: Secondary | ICD-10-CM

## 2011-08-30 MED ORDER — ATORVASTATIN CALCIUM 20 MG PO TABS: 20.0000 mg | ORAL_TABLET | Freq: Every day | ORAL | Status: DC

## 2011-08-30 NOTE — Discharge Instructions (Signed)
STROKE/TIA DISCHARGE INSTRUCTIONS SMOKING Cigarette smoking nearly doubles your risk of having a stroke & is the single most alterable risk factor  If you smoke or have smoked in the last 12 months, you are advised to quit smoking for your health.  Most of the excess cardiovascular risk related to smoking disappears within a year of stopping.  Ask you doctor about anti-smoking medications  [place] Quit Line: 1-800-QUIT NOW  Free Smoking Cessation Classes [phone]  CHOLESTEROL Know your levels; limit fat & cholesterol in your diet  Lipid Panel     Component Value Date/Time   CHOL 181 08/29/2011 0040   TRIG 309* 08/29/2011 0040   HDL 37* 08/29/2011 0040   CHOLHDL 4.9 08/29/2011 0040   VLDL 62* 08/29/2011 0040   LDLCALC 82 08/29/2011 0040      Many patients benefit from treatment even if their cholesterol is at goal.  Goal: Total Cholesterol (CHOL) less than 160  Goal:  Triglycerides (TRIG) less than 150  Goal:  HDL greater than 40  Goal:  LDL (LDLCALC) less than 100   BLOOD PRESSURE American Stroke Association blood pressure target is less that 120/80 mm/Hg  Your discharge blood pressure is:  BP: 125/79 mmHg  Monitor your blood pressure  Limit your salt and alcohol intake  Many individuals will require more than one medication for high blood pressure  DIABETES (A1c is a blood sugar average for last 3 months) Goal HGBA1c is under 7% (HBGA1c is blood sugar average for last 3 months)  Diabetes: {STROKE DC DIABETES:22357}    Lab Results  Component Value Date   HGBA1C 5.7* 08/28/2011     Your HGBA1c can be lowered with medications, healthy diet, and exercise.  Check your blood sugar as directed by your physician  Call your physician if you experience unexplained or low blood sugars.  PHYSICAL ACTIVITY/REHABILITATION Goal is 30 minutes at least 4 days per week    {STROKE DC ACTIVITY/REHAB:22359}  Activity decreases your risk of heart attack and stroke and makes your heart  stronger.  It helps control your weight and blood pressure; helps you relax and can improve your mood.  Participate in a regular exercise program.  Talk with your doctor about the best form of exercise for you (dancing, walking, swimming, cycling).  DIET/WEIGHT Goal is to maintain a healthy weight  Your discharge diet is: General *** liquids Your height is:  Height: 5\' 5"  (165.1 cm) Your current weight is: Weight: 103.2 kg (227 lb 8.2 oz) Your Body Mass Index (BMI) is:  BMI (Calculated): 37.9   Following the type of diet specifically designed for you will help prevent another stroke.  Your goal weight range is:  ***  Your goal Body Mass Index (BMI) is 19-24.  Healthy food habits can help reduce 3 risk factors for stroke:  High cholesterol, hypertension, and excess weight.  RESOURCES Stroke/Support Group:  Call [phone]  they meet the 3rd Sunday of the month on the Rehab Unit at [facility], [place] ( no meetings June, July & Aug).  STROKE EDUCATION PROVIDED/REVIEWED AND GIVEN TO PATIENT Stroke warning signs and symptoms How to activate emergency medical system (call 911). Medications prescribed at discharge. Need for follow-up after discharge. Personal risk factors for stroke. Pneumonia vaccine given:   {STROKE DC YES/NO/DATE:22363} Flu vaccine given:   {STROKE DC YES/NO/DATE:22363} My questions have been answered, the writing is legible, and I understand these instructions.  I will adhere to these goals & educational materials that have  been provided to me after my discharge from the hospital.

## 2011-08-30 NOTE — Progress Notes (Signed)
  Echocardiogram 2D Echocardiogram has been performed.  Jorje Guild [facility] 08/30/2011, 1:19 PM

## 2011-08-30 NOTE — Discharge Summary (Signed)
Hospital Discharge Note  Name: Kara Wright MRN: [id number] DOB: 10-06-1945 66 y.o.  Date of Admission: 08/28/2011 10:02 AM Date of Discharge: 08/30/2011 Attending Physician: Edsel Petrin, DO  Discharge Diagnosis: Principal Problem:  *TIA (transient ischemic attack) Active Problems:  HYPOTHYROIDISM  HYPERLIPIDEMIA  MIGRAINE HEADACHE  GERD  Atrial fibrillation  HTN (hypertension)  Jaw claudication   Discharge Medications: Medication List  As of 08/30/2011  5:25 PM   TAKE these medications         aspirin EC 81 MG tablet   Take 81 mg by mouth daily.      atorvastatin 20 MG tablet   Commonly known as: LIPITOR   Take 1 tablet (20 mg total) by mouth daily at 6 PM.      CENTRUM SILVER ULTRA WOMENS PO   Take by mouth daily.      cholecalciferol 1000 UNITS tablet   Commonly known as: VITAMIN D   Take 1,000 Units by mouth. Take two tablets daily      clobetasol cream 0.05 %   Commonly known as: TEMOVATE   Apply 1 application topically 2 (two) times daily as needed. For rash      levothyroxine 200 MCG tablet   Commonly known as: SYNTHROID, LEVOTHROID   Take 200 mcg by mouth daily.      promethazine 25 MG tablet   Commonly known as: PHENERGAN   Take 25 mg by mouth every 6 (six) hours as needed. For nausea      propranolol 80 MG 24 hr capsule   Commonly known as: INDERAL LA   Take 80 mg by mouth daily.            Disposition and follow-up:   [name] was discharged from [facility] in Stable condition.    Follow-up Appointments: Follow-up Information    Follow up with Crawford Givens, MD .        Discharge Orders    Future Appointments: Provider: Department: Dept Phone: Center:   09/18/2011 8:45 AM Antonieta Iba, MD [facility] [phone] [facility]     Future Orders Please Complete By Expires   Diet - low sodium heart healthy      Increase activity slowly      Discharge instructions      Comments:   I have  ordered one blood test prior to you leaving the hospital will contact you if this is abnormal. Please follow up with your primary doctor if problems persist. Return to ED if your numbness lasts for >1 hour.   Call MD for:  temperature >100.4      Call MD for:  persistant nausea and vomiting      Call MD for:  severe uncontrolled pain      Call MD for:  difficulty breathing, headache or visual disturbances      Call MD for:  hives      Call MD for:  persistant dizziness or light-headedness      Call MD for:  extreme fatigue         Consultations:    Procedures Performed:  Ct Head Wo Contrast  08/28/2011  *RADIOLOGY REPORT*  Clinical Data: Code stroke  CT HEAD WITHOUT CONTRAST  Technique:  Contiguous axial images were obtained from the base of the skull through the vertex without contrast.  Comparison: None.  Findings: There is no evidence for acute hemorrhage, hydrocephalus, mass lesion, or abnormal extra-axial fluid collection.  No definite CT evidence for  acute infarction.  The visualized paranasal sinuses and mastoid air cells are clear.  IMPRESSION: No acute intracranial abnormality.  I personally called these results to Dr. Roseanne Reno at 1058 hours on 08/28/2011.  Original Report Authenticated By: ERIC A. MANSELL, M.D.   Mri Brain Without Contrast  08/29/2011  *RADIOLOGY REPORT*  Clinical Data: Recurrent right facial sensory symptoms.  Evaluate for stroke.  History of hypertension and atrial fibrillation.  MRI HEAD WITHOUT CONTRAST  Technique:  Multiplanar, multiecho pulse sequences of the brain and surrounding structures were obtained according to standard protocol without intravenous contrast.  Comparison: CT head 08/28/2011  Findings: There is no evidence for acute infarction, intracranial hemorrhage, mass lesion, hydrocephalus, or extra-axial fluid.  There is mild atrophy with mild to moderate chronic microvascular ischemic change.  No areas of chronic hemorrhage.  No midline shift.  Major  intracranial vascular structures patent.  Partially empty sella.  IMPRESSION:  Atrophy and small vessel disease.  No acute intracranial findings.  Original Report Authenticated By: Elsie Stain, M.D.   Mr Mra Head/brain Wo Cm  08/29/2011  *RADIOLOGY REPORT*  Clinical Data: Acute but ill-defined cerebrovascular disease. Episodic right facial numbness, possible TIA.  MRA HEAD WITHOUT CONTRAST  Technique: Angiographic images of the Circle of Willis were obtained using MRA technique without intravenous contrast.  Comparison: None.  Findings: Widely patent internal carotid arteries.  Dolichoectatic and widely patent basilar artery.  Vertebrals codominant.  Mild nonstenotic irregularity left middle cerebral artery M1 segment. No visible MCA branch occlusion.  Mild irregularity distal MCA and PCA branches further suggest intracranial atherosclerotic change. No visible cerebellar branch occlusion.  No visible intracranial aneurysm.  IMPRESSION: Mild nonstenotic intracranial atherosclerotic changes as described.  Original Report Authenticated By: Elsie Stain, M.D.     Admission HPI: Kara Wright is an 66 y.o.woman with history of hyperlipidemia and history of a single episode of atrial fibrillation without recurrence, presenting with intermittent numbness involving right side of her face and right upper extremity since 08/25/2011. She sought medical attention in the emergency room at [facility] on 08/26/2011 because of headache as well as numbness over the right side of her face and right arm. Also c/o jaw pain. Symptoms are very brief and intermittent, none have persisted, multiple outpatient w/u.  Hospital Course by problem list: Principal Problem:  *TIA (transient ischemic attack) Active Problems:  HYPOTHYROIDISM  HYPERLIPIDEMIA  MIGRAINE HEADACHE  GERD  Atrial fibrillation  HTN (hypertension)  Jaw claudication    1. Facial numbness/HA/Jaw pain/A-Fib Patient is being  discharged in stable condition. She had complete w/u for Stroke/TIA in setting of paroxysmal A-Fib. MRI neg, Dops neg, EKG neg, 2D Echo neg. Symptoms have completely resolved on discharge. I suspect that she has atypical migraine with facial numbness since this was associated with HA and jaw pain. Before she leaves I will check an ESR to r/o temporal arteritis. Other facial and UE neuropathies possible-she also is recovering from a sinus infection which may be contributing to her intermittent symptoms as well. I have recommended she she her PCP in 1 week and keep her appointment with cardiology in a few weeks. She should ideally be on a statin, but wants to discuss this with cardiology. She is also being discharged on Aspirin therapy. She has PAF so cardiology may need to discuss anticoagulation with her or consider outpatient monitoring.  Discharge Vitals:  BP 153/81  Pulse 78  Temp(Src) 97.9 F (36.6 C) (Oral)  Resp 18  Ht  5\' 5"  (1.651 m)  Wt 103.2 kg (227 lb 8.2 oz)  BMI 37.86 kg/m2  SpO2 98%  Discharge Labs: No results found for this or any previous visit (from the past 24 hour(s)).  SignedAnderson Malta 08/30/2011, 5:25 PM

## 2011-08-30 NOTE — Progress Notes (Signed)
VASCULAR LAB PRELIMINARY  PRELIMINARY  PRELIMINARY  PRELIMINARY  Carotid duplex has been completed.    Preliminary report: Bilateral:  Mild plaque disease noted.  No ICA stenosis found.  Vertebral artery flow antegrade.  Vanna Scotland,   RVT 08/30/2011, 11:55 AM

## 2011-09-04 ENCOUNTER — Encounter: Payer: Self-pay | Admitting: Family Medicine

## 2011-09-04 ENCOUNTER — Ambulatory Visit (INDEPENDENT_AMBULATORY_CARE_PROVIDER_SITE_OTHER): Payer: BC Managed Care – PPO | Admitting: Family Medicine

## 2011-09-04 VITALS — BP 132/70 | HR 67 | Temp 97.7°F | Wt 231.0 lb

## 2011-09-04 DIAGNOSIS — I4891 Unspecified atrial fibrillation: Secondary | ICD-10-CM

## 2011-09-04 DIAGNOSIS — L0390 Cellulitis, unspecified: Secondary | ICD-10-CM

## 2011-09-04 MED ORDER — SULFAMETHOXAZOLE-TRIMETHOPRIM 800-160 MG PO TABS: 2.0000 | ORAL_TABLET | Freq: Two times a day (BID) | ORAL | Status: AC

## 2011-09-04 NOTE — Patient Instructions (Addendum)
I would think about starting the lipitor.  I would look at www.heart.org for diet tips.  Talk with Dr. Mariah Milling about possibly needing blood thinners.   Call me with questions.  If you have another episode that doesn't get better in 20-30 minutes, then go the hospital.

## 2011-09-04 NOTE — Progress Notes (Signed)
Was seen at [facility] ER with HA and R arm tingling.  Was thought to have a TIA.  H/o AF prev.  Was discharged and plan was for outpatient w/u.  Then she had R sided facial numbness and went to [facility].  Inpatient w/u was neg (MRI, carotid dopplers, echo done).  The second episode may not have been a TIA.    Now w/o CP, does have occ palpitatoins that are brief.  SOB with exertion up stairs, but this is likely due to deconditioning.  No sx walking on flat ground.    She didn't want to start taking lipitor.  She was worried about side effects.  She had oatmeal and fruit for breakfast this AM.  She's trying to cut back on soda and is grilling food.  We talked about options.  I asked her to talk to with cards about potential anticoagulation.   Medial L shin with boil for 2 days.  H/o similar prev.  No fevers. Tender, no drainage.    PMH and SH reviewed  ROS: See HPI, otherwise noncontributory.  Meds, vitals, and allergies reviewed.   nad ncat Mmm Rrr, no ectopy today ctab abd soft, not ttp 3x5 cm red patch on leg w/o fluctuance.

## 2011-09-06 DIAGNOSIS — L0390 Cellulitis, unspecified: Secondary | ICD-10-CM | POA: Insufficient documentation

## 2011-09-06 NOTE — Assessment & Plan Note (Signed)
No need to I&D. Warm compresses and start septra. F/u prn.  She agrees.

## 2011-09-06 NOTE — Assessment & Plan Note (Signed)
rrr today, d/w pt about lipid control and f/u with cards.  She'll consider.  Continue work on diet, weight.  >25 min spent with face to face with patient, >50% counseling and/or coordinating care.

## 2011-09-14 ENCOUNTER — Encounter: Payer: Self-pay | Admitting: Cardiovascular Disease

## 2011-09-18 ENCOUNTER — Ambulatory Visit (INDEPENDENT_AMBULATORY_CARE_PROVIDER_SITE_OTHER): Payer: BC Managed Care – PPO | Admitting: Cardiovascular Disease

## 2011-09-18 ENCOUNTER — Encounter: Payer: Self-pay | Admitting: Cardiovascular Disease

## 2011-09-18 VITALS — BP 110/68 | HR 66 | Ht 65.0 in | Wt 227.0 lb

## 2011-09-18 DIAGNOSIS — I10 Essential (primary) hypertension: Secondary | ICD-10-CM

## 2011-09-18 DIAGNOSIS — E785 Hyperlipidemia, unspecified: Secondary | ICD-10-CM

## 2011-09-18 DIAGNOSIS — I4891 Unspecified atrial fibrillation: Secondary | ICD-10-CM

## 2011-09-18 DIAGNOSIS — E669 Obesity, unspecified: Secondary | ICD-10-CM

## 2011-09-18 DIAGNOSIS — G459 Transient cerebral ischemic attack, unspecified: Secondary | ICD-10-CM

## 2011-09-18 MED ORDER — ATORVASTATIN CALCIUM 10 MG PO TABS: 10.0000 mg | ORAL_TABLET | Freq: Every day | ORAL | Status: DC

## 2011-09-18 NOTE — Assessment & Plan Note (Addendum)
No atrial fibrillation in the hospital. She will monitor her pulse with a pulse oximeter. Any irregularity, we will order a Holter or event monitor. Normal echocardiogram. We will continue aspirin for now.

## 2011-09-18 NOTE — Patient Instructions (Signed)
Please start lipitor 10 mg daily Please increase aspirin to 81 mg x 2 a day  Please call us if you have new issues that need to be addressed before your next appt.  Your physician wants you to follow-up in: 6 months.  You will receive a reminder letter in the mail two months in advance. If you don't receive a letter, please call our office to schedule the follow-up appointment.

## 2011-09-18 NOTE — Assessment & Plan Note (Signed)
No documentation of atrial fibrillation even while on monitor for several days in the hospital. She does not have significant symptoms. We did offer Holter or event monitor. She will monitor her pulse rate herself with a pulse oximeter at home. We did discuss anticoagulation and she would prefer not to be on warfarin. We will increase the aspirin to 81 mg x2. If she has any additional TIA, she will need warfarin or xarelto.   She does have mild carotid plaquing, mild to moderate vessel changes in the head. We have suggested we be aggressive with her cholesterol.

## 2011-09-18 NOTE — Assessment & Plan Note (Signed)
We have encouraged continued exercise, careful diet management in an effort to lose weight. 

## 2011-09-18 NOTE — Assessment & Plan Note (Signed)
We will start her on Lipitor 10 mg daily, called LDL less than 70. Carotid plaquing, also mild to moderate ischemic changes in the head.

## 2011-09-18 NOTE — Progress Notes (Signed)
Patient ID: Kara Wright, female    DOB: Feb 18, 1946, 66 y.o.   MRN: [id number]  HPI Comments: Kara Wright is a very pleasant 66 year old woman with obesity, hypothyroidism, remote smoking history for less than 10 years, presenting after a TIA.   She reports that on 08/25/2011, she had right eye vision changes, right arm weakness and numbness lasting for 30-40 minutes. Symptoms seemed to resolve. The next day, on Saturday, she had some mouth and facial numbness and she went to the emergency room. She also had headache. They did a EKG and CT scan of the head that showed no stroke. Early in the week, she continued to have headache and mouth issues and she was taken to [facility]. She had MRA that showed small vessel disease, carotid ultrasound that showed mild plaque. She reports that she was in the hospital for 3 days. No atrial fibrillation was seen. Echocardiogram was essentially normal.  Last episode of atrial fibrillation was May 2012.  She does report having very rare, short episodes of palpitations. Symptoms are different from previous episode of atrial fibrillation Currently she feels well with no complaints  EKG today shows normal sinus rhythm with rate 65 beats per minute with no significant ST or T wave changes    Outpatient Encounter Prescriptions as of 09/18/2011  Medication Sig Dispense Refill  . aspirin EC 81 MG tablet Take 81 mg by mouth daily.      . cholecalciferol (VITAMIN D) 1000 UNITS tablet Take 1,000 Units by mouth. Take two tablets daily      . clobetasol cream (TEMOVATE) 0.05 % Apply 1 application topically 2 (two) times daily as needed. For rash      . levothyroxine (SYNTHROID, LEVOTHROID) 200 MCG tablet Take 200 mcg by mouth daily. Except for on Sunday      . Multiple Vitamins-Minerals (CENTRUM SILVER ULTRA WOMENS PO) Take by mouth daily.       . propranolol (INDERAL LA) 80 MG 24 hr capsule Take 80 mg by mouth daily.      . promethazine (PHENERGAN) 25 MG tablet  Take 25 mg by mouth every 6 (six) hours as needed. For nausea        Review of Systems  Constitutional: Negative.   HENT: Negative.   Eyes: Negative.   Respiratory: Negative.   Cardiovascular: Negative.   Gastrointestinal: Negative.   Musculoskeletal: Negative.   Skin: Negative.   Neurological:       Recent TIA type symptoms with headache, right arm numbness, facial symptoms and vision change on the right  Hematological: Negative.   Psychiatric/Behavioral: Negative.   All other systems reviewed and are negative.    BP 110/68  Pulse 66  Ht 5\' 5"  (1.651 m)  Wt 227 lb (102.967 kg)  BMI 37.77 kg/m2  Physical Exam  Nursing note and vitals reviewed. Constitutional: She is oriented to person, place, and time. She appears well-developed and well-nourished.       Obese.  HENT:  Head: Normocephalic.  Nose: Nose normal.  Mouth/Throat: Oropharynx is clear and moist.  Eyes: Conjunctivae are normal. Pupils are equal, round, and reactive to light.  Neck: Normal range of motion. Neck supple. No JVD present.  Cardiovascular: Normal rate, regular rhythm, S1 normal, S2 normal, normal heart sounds and intact distal pulses.  Exam reveals no gallop and no friction rub.   No murmur heard. Pulmonary/Chest: Effort normal and breath sounds normal. No respiratory distress. She has no wheezes. She has no rales.  She exhibits no tenderness.  Abdominal: Soft. Bowel sounds are normal. She exhibits no distension. There is no tenderness.  Musculoskeletal: Normal range of motion. She exhibits no edema and no tenderness.  Lymphadenopathy:    She has no cervical adenopathy.  Neurological: She is alert and oriented to person, place, and time. Coordination normal.  Skin: Skin is warm and dry. No rash noted. No erythema.  Psychiatric: She has a normal mood and affect. Her behavior is normal. Judgment and thought content normal.         Assessment and Plan

## 2011-09-18 NOTE — Assessment & Plan Note (Signed)
Blood pressure is well controlled on today's visit. No changes made to the medications. 

## 2012-02-20 ENCOUNTER — Telehealth: Payer: Self-pay | Admitting: Family Medicine

## 2012-02-20 ENCOUNTER — Ambulatory Visit (INDEPENDENT_AMBULATORY_CARE_PROVIDER_SITE_OTHER): Payer: BC Managed Care – PPO | Admitting: *Deleted

## 2012-02-20 DIAGNOSIS — Z23 Encounter for immunization: Secondary | ICD-10-CM

## 2012-02-20 NOTE — Telephone Encounter (Signed)
Patient called to say that she stepped on glass last Sunday and got most of it out but there seems to be more in her foot but she cant see the glass and thinks its in there deep. Dr Para March has set her up an appt today to see Dr Orland Jarred at [facility] and she will also come here today at 2;15 to receive a tetanus shot from our clinic.

## 2012-03-07 ENCOUNTER — Ambulatory Visit: Payer: Medicare Other | Admitting: Cardiovascular Disease

## 2012-03-11 ENCOUNTER — Ambulatory Visit: Payer: Medicare Other | Admitting: Cardiovascular Disease

## 2012-03-18 ENCOUNTER — Encounter: Payer: Self-pay | Admitting: Cardiovascular Disease

## 2012-03-18 ENCOUNTER — Ambulatory Visit (INDEPENDENT_AMBULATORY_CARE_PROVIDER_SITE_OTHER): Payer: BC Managed Care – PPO | Admitting: Cardiovascular Disease

## 2012-03-18 VITALS — BP 138/80 | HR 63 | Ht 65.0 in | Wt 225.0 lb

## 2012-03-18 DIAGNOSIS — I4891 Unspecified atrial fibrillation: Secondary | ICD-10-CM

## 2012-03-18 DIAGNOSIS — E669 Obesity, unspecified: Secondary | ICD-10-CM

## 2012-03-18 DIAGNOSIS — I10 Essential (primary) hypertension: Secondary | ICD-10-CM

## 2012-03-18 DIAGNOSIS — E785 Hyperlipidemia, unspecified: Secondary | ICD-10-CM

## 2012-03-18 DIAGNOSIS — G459 Transient cerebral ischemic attack, unspecified: Secondary | ICD-10-CM

## 2012-03-18 DIAGNOSIS — I6529 Occlusion and stenosis of unspecified carotid artery: Secondary | ICD-10-CM

## 2012-03-18 NOTE — Patient Instructions (Addendum)
You are doing well. Please come to the office tomorrow for blood work, fasting  Please call us if you have new issues that need to be addressed before your next appt.  Your physician wants you to follow-up in: 6 months.  You will receive a reminder letter in the mail two months in advance. If you don't receive a letter, please call our office to schedule the follow-up appointment.

## 2012-03-18 NOTE — Assessment & Plan Note (Signed)
She is currently taking Lipitor 10 mg daily we will check her cholesterol. Then we have suggested she hold the Lipitor to see if her joint pain gets better. If it does, we could try simvastatin or pravastatin.

## 2012-03-18 NOTE — Progress Notes (Signed)
Patient ID: Kara Wright, female    DOB: 12/09/1945, 66 y.o.   MRN: [id number]  HPI Comments: [name] is a very pleasant 66 year old woman with obesity, hypothyroidism, remote smoking history for less than 10 years, presenting after a TIA.   She reports that on 08/25/2011, she had right eye vision changes, right arm weakness and numbness lasting for 30-40 minutes. Symptoms seemed to resolve. The next day, on Saturday, she had some mouth and facial numbness and she went to the emergency room. She also had headache. They did a EKG and CT scan of the head that showed no stroke. Early in the week, she continued to have headache and mouth issues and she was taken to [facility]. She had MRA that showed small vessel disease, carotid ultrasound that showed mild plaque. She reports that she was in the hospital for 3 days. No atrial fibrillation was seen.   Echocardiogram was essentially normal.  Last episode of atrial fibrillation was May 2012.   She reports that she has no energy. She has started on B12 with no improvement. She is very tired all the time also with joint aches in her hands and toes. She wonders if it could be from the Lipitor. Otherwise she feels well with no other complaints. No other TIA or stroke-type symptoms  EKG today shows normal sinus rhythm with rate 63 beats per minute with no significant ST or T wave changes    Outpatient Encounter Prescriptions as of 03/18/2012  Medication Sig Dispense Refill  . aspirin EC 81 MG tablet Take 162 mg by mouth daily.       Marland Kitchen atorvastatin (LIPITOR) 10 MG tablet Take 1 tablet (10 mg total) by mouth daily.  90 tablet  3  . cholecalciferol (VITAMIN D) 1000 UNITS tablet Take 1,000 Units by mouth. Take two tablets daily      . clobetasol cream (TEMOVATE) 0.05 % Apply 1 application topically 2 (two) times daily as needed. For rash      . levothyroxine (SYNTHROID, LEVOTHROID) 200 MCG tablet Take 200 mcg by mouth daily. Except for on  Sunday      . Multiple Vitamins-Minerals (CENTRUM SILVER ULTRA WOMENS PO) Take by mouth daily.       . promethazine (PHENERGAN) 25 MG tablet Take 25 mg by mouth every 6 (six) hours as needed. For nausea      . propranolol (INDERAL LA) 80 MG 24 hr capsule Take 80 mg by mouth daily.      . vitamin B-12 (CYANOCOBALAMIN) 500 MCG tablet Take 500 mcg by mouth daily.         Review of Systems  Constitutional: Negative.   HENT: Negative.   Eyes: Negative.   Respiratory: Negative.   Cardiovascular: Negative.   Gastrointestinal: Negative.   Musculoskeletal: Negative.   Skin: Negative.   Neurological: Negative.   Hematological: Negative.   Psychiatric/Behavioral: Negative.   All other systems reviewed and are negative.    BP 138/80  Pulse 63  Ht 5\' 5"  (1.651 m)  Wt 225 lb (102.059 kg)  BMI 37.44 kg/m2  Physical Exam  Nursing note and vitals reviewed. Constitutional: She is oriented to person, place, and time. She appears well-developed and well-nourished.  HENT:  Head: Normocephalic.  Nose: Nose normal.  Mouth/Throat: Oropharynx is clear and moist.  Eyes: Conjunctivae normal are normal. Pupils are equal, round, and reactive to light.  Neck: Normal range of motion. Neck supple. No JVD present.  Cardiovascular: Normal rate,  regular rhythm, S1 normal, S2 normal, normal heart sounds and intact distal pulses.  Exam reveals no gallop and no friction rub.   No murmur heard. Pulmonary/Chest: Effort normal and breath sounds normal. No respiratory distress. She has no wheezes. She has no rales. She exhibits no tenderness.  Abdominal: Soft. Bowel sounds are normal. She exhibits no distension. There is no tenderness.  Musculoskeletal: Normal range of motion. She exhibits no edema and no tenderness.  Lymphadenopathy:    She has no cervical adenopathy.  Neurological: She is alert and oriented to person, place, and time. Coordination normal.  Skin: Skin is warm and dry. No rash noted. No  erythema.  Psychiatric: She has a normal mood and affect. Her behavior is normal. Judgment and thought content normal.         Assessment and Plan

## 2012-03-18 NOTE — Assessment & Plan Note (Signed)
Blood pressure is well controlled on today's visit. No changes made to the medications. 

## 2012-03-18 NOTE — Assessment & Plan Note (Signed)
No further TIA type symptoms. We have encouraged her to continue on aspirin.

## 2012-03-18 NOTE — Assessment & Plan Note (Signed)
We have encouraged continued exercise, careful diet management in an effort to lose weight. 

## 2012-03-18 NOTE — Assessment & Plan Note (Signed)
Maintain normal sinus rhythm. No changes to her medications

## 2012-03-19 ENCOUNTER — Ambulatory Visit (INDEPENDENT_AMBULATORY_CARE_PROVIDER_SITE_OTHER): Payer: BC Managed Care – PPO

## 2012-03-19 DIAGNOSIS — I6529 Occlusion and stenosis of unspecified carotid artery: Secondary | ICD-10-CM

## 2012-03-19 DIAGNOSIS — E785 Hyperlipidemia, unspecified: Secondary | ICD-10-CM

## 2012-03-20 LAB — HEPATIC FUNCTION PANEL
ALT: 22 IU/L (ref 0–32)
AST: 22 IU/L (ref 0–40)
Albumin: 4.4 g/dL (ref 3.6–4.8)
Alkaline Phosphatase: 89 IU/L (ref 47–112)
Bilirubin, Direct: 0.17 mg/dL (ref 0.00–0.40)
Total Bilirubin: 0.6 mg/dL (ref 0.0–1.2)

## 2012-03-20 LAB — LIPID PANEL: Chol/HDL Ratio: 3 ratio units (ref 0.0–4.4)

## 2012-03-21 ENCOUNTER — Encounter: Payer: Self-pay | Admitting: Internal Medicine

## 2012-03-22 ENCOUNTER — Other Ambulatory Visit: Payer: Self-pay | Admitting: *Deleted

## 2012-03-22 NOTE — Telephone Encounter (Signed)
Faxed refill request   

## 2012-03-24 MED ORDER — PROMETHAZINE HCL 25 MG PO TABS: 25.0000 mg | ORAL_TABLET | Freq: Four times a day (QID) | ORAL | Status: DC | PRN

## 2012-03-24 NOTE — Telephone Encounter (Signed)
Sent!

## 2012-03-27 ENCOUNTER — Ambulatory Visit (INDEPENDENT_AMBULATORY_CARE_PROVIDER_SITE_OTHER): Payer: BC Managed Care – PPO | Admitting: Internal Medicine

## 2012-03-27 ENCOUNTER — Encounter: Payer: Self-pay | Admitting: Internal Medicine

## 2012-03-27 VITALS — BP 132/60 | HR 60 | Ht 65.0 in | Wt 226.0 lb

## 2012-03-27 DIAGNOSIS — Z800 Family history of malignant neoplasm of digestive organs: Secondary | ICD-10-CM

## 2012-03-27 DIAGNOSIS — Z860100 Personal history of colon polyps, unspecified: Secondary | ICD-10-CM

## 2012-03-27 DIAGNOSIS — R1310 Dysphagia, unspecified: Secondary | ICD-10-CM

## 2012-03-27 DIAGNOSIS — K219 Gastro-esophageal reflux disease without esophagitis: Secondary | ICD-10-CM

## 2012-03-27 DIAGNOSIS — R110 Nausea: Secondary | ICD-10-CM

## 2012-03-27 MED ORDER — PEG-KCL-NACL-NASULF-NA ASC-C 100 G PO SOLR: 1.0000 | Freq: Once | ORAL | Status: DC

## 2012-03-27 MED ORDER — OMEPRAZOLE 20 MG PO CPDR: 20.0000 mg | DELAYED_RELEASE_CAPSULE | Freq: Every day | ORAL | Status: DC

## 2012-03-27 MED ORDER — DEXLANSOPRAZOLE 60 MG PO CPDR: 60.0000 mg | DELAYED_RELEASE_CAPSULE | Freq: Every day | ORAL | Status: DC

## 2012-03-27 NOTE — Patient Instructions (Addendum)
You have been scheduled for an endoscopy with dilation and colonoscopy with propofol. Please follow the written instructions given to you at your visit today. Please pick up your prep at the pharmacy within the next 1-3 days. If you use inhalers (even only as needed), please bring them with you on the day of your procedure.  We have sent the following medications to your pharmacy for you to pick up at your convenience:  Omeprazole  You have been given a sample of Dexilant to take until you pick up your prescription

## 2012-03-27 NOTE — Progress Notes (Signed)
HISTORY OF PRESENT ILLNESS:  Kara Wright is a 66 y.o. female with a history of hypertension, hyperlipidemia, migraine headaches, GERD, and a family history of colon cancer for which she is undergone prior colonoscopy. Patient was last seen in the office 06/05/2006 regarding asymptomatic Hemoccult-positive stool. See that dictation. She subsequently underwent colonoscopy and upper endoscopy concurrently on 07/05/2006. Colonoscopy, including intubation of the ileum, was normal except for mild sigmoid diverticulosis. Routine followup in 5 years (parent with colon cancer at age 42) was recommended. Upper endoscopy revealed mild reflux esophagitis as well as erosive gastritis. Testing for Helicobacter pylori was negative. Hemoccult-positive stool was felt secondary to NSAID-induced gastritis. Patient had also been complaining of abdominal pain at that time which was felt secondary to gallstones. She subsequently underwent cholecystectomy. She has not been seen since. She presents today regarding routine surveillance colonoscopy. Also problems with reflux and dysphagia. Patient reports a 6-8 month history of intermittent solid food dysphagia. She does have fairly frequent reflux symptoms for which she takes [place] daily. She also has problems with nausea. No weight loss. No other GI complaints.  REVIEW OF SYSTEMS:  All non-GI ROS negative except for fatigue, allergies, itching, skin rash  Past Medical History  Diagnosis Date  . Diverticulosis 03/1997  . GERD (gastroesophageal reflux disease) 1997    good on prilosec  . Hyperlipidemia 08/1996  . Hypothyroidism 1984  . Osteopenia 11/2002    via dexa  . Atrial fib/flutter, transient     1 episode, resolved, never on coumadin  . Migraine     w/o aura  . Dysrhythmia     atrial fibrilation  . Hepatitis     hx of hep B    Past Surgical History  Procedure Date  . Polypectomy 04/06/97    colon, benign by pathology  . Partial hysterectomy 1978-79  .  Cholecystectomy 07/2006  . 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 Nasolabial fold skin flap surgery 08/02/2007    [facility]  . Varicose vein ablation sclerotherapy 09/2008    Dr. Earnestine Leys  . Abdominal hysterectomy     Social History Kara Wright  reports that she quit smoking about 25 years ago. She has never used smokeless tobacco. She reports that she does not drink alcohol or use illicit drugs.  family history includes Allergies in her sister; Colon cancer (age of onset:60) in her father; Hypertension in her mother; Osteopenia in her mother; and Stroke in her mother.  Allergies  Allergen Reactions  . Celecoxib     REACTION: nausea: GI UPSET  . Nickel     Skin rash, allergy       PHYSICAL EXAMINATION: Vital signs: BP 132/60  Pulse 60  Ht 5\' 5"  (1.651 m)  Wt 226 lb (102.513 kg)  BMI 37.61 kg/m2  Constitutional: generally well-appearing, no acute distress Psychiatric: alert and oriented x3, cooperative Eyes: extraocular movements intact, anicteric, conjunctiva pink Mouth: oral pharynx moist, no lesions Neck: supple no lymphadenopathy Cardiovascular: heart regular rate and rhythm, no murmur Lungs: clear to auscultation bilaterally Abdomen: soft,obese, nontender, nondistended, no obvious ascites, no peritoneal signs, normal bowel sounds, no organomegaly Rectal:deferred until colonoscopy Extremities: no lower extremity edema bilaterally Skin: no lesions on visible extremities Neuro: No focal deficits.   ASSESSMENT:  #1. GERD. Significant symptoms on antacids. Prior upper endoscopy demonstrating esophagitis #2. Intermittent solid food dysphagia and 8 months duration. 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 Family history of colon cancer in  a parent at age 61. Last colonoscopy January 2008. No neoplasia. Due for followup   PLAN:  #1. Reflux precautions #2. Prescribe omeprazole 20 mg daily #3. Schedule upper endoscopy with esophageal dilation.The  nature of the procedure, as well as the risks, benefits, and alternatives were carefully and thoroughly reviewed with the patient. Ample time for discussion and questions allowed. The patient understood, was satisfied, and agreed to proceed.  #4. Surveillance colonoscopy.The nature of the procedure, as well as the risks, benefits, and alternatives were carefully and thoroughly reviewed with the patient. Ample time for discussion and questions allowed. The patient understood, was satisfied, and agreed to proceed. Movi prep prescribed. The patient instructed on its use. #5. Ongoing general medical care with Dr. Para March

## 2012-04-04 ENCOUNTER — Encounter: Payer: BC Managed Care – PPO | Admitting: Internal Medicine

## 2012-04-09 ENCOUNTER — Ambulatory Visit (AMBULATORY_SURGERY_CENTER): Payer: BC Managed Care – PPO | Admitting: Internal Medicine

## 2012-04-09 ENCOUNTER — Encounter: Payer: Self-pay | Admitting: Internal Medicine

## 2012-04-09 VITALS — BP 143/82 | HR 64 | Temp 98.4°F | Resp 33 | Ht 65.0 in | Wt 226.0 lb

## 2012-04-09 DIAGNOSIS — R1310 Dysphagia, unspecified: Secondary | ICD-10-CM

## 2012-04-09 DIAGNOSIS — Z86010 Personal history of colonic polyps: Secondary | ICD-10-CM

## 2012-04-09 DIAGNOSIS — Z1211 Encounter for screening for malignant neoplasm of colon: Secondary | ICD-10-CM

## 2012-04-09 DIAGNOSIS — R110 Nausea: Secondary | ICD-10-CM

## 2012-04-09 DIAGNOSIS — K2900 Acute gastritis without bleeding: Secondary | ICD-10-CM

## 2012-04-09 DIAGNOSIS — K219 Gastro-esophageal reflux disease without esophagitis: Secondary | ICD-10-CM

## 2012-04-09 DIAGNOSIS — Z800 Family history of malignant neoplasm of digestive organs: Secondary | ICD-10-CM

## 2012-04-09 MED ORDER — SODIUM CHLORIDE 0.9 % IV SOLN: 500.0000 mL | INTRAVENOUS | Status: DC

## 2012-04-09 NOTE — Patient Instructions (Addendum)
Handouts were given to your care partner on mild diverticulosis, high fiber diet, gastritis and GERD.  You may resume your current medications today.  Per Dr. Marina Goodell please initiate omeprazole 20 mg daily as previously prescribed.  Please call if any questions or concerns.   YOU HAD AN ENDOSCOPIC PROCEDURE TODAY AT THE [facility]: Refer to the procedure report that was given to you for any specific questions about what was found during the examination.  If the procedure report does not answer your questions, please call your gastroenterologist to clarify.  If you requested that your care partner not be given the details of your procedure findings, then the procedure report has been included in a sealed envelope for you to review at your convenience later.  YOU SHOULD EXPECT: Some feelings of bloating in the abdomen. Passage of more gas than usual.  Walking can help get rid of the air that was put into your GI tract during the procedure and reduce the bloating. If you had a lower endoscopy (such as a colonoscopy or flexible sigmoidoscopy) you may notice spotting of blood in your stool or on the toilet paper. If you underwent a bowel prep for your procedure, then you may not have a normal bowel movement for a few days.  DIET: Your first meal following the procedure should be a light meal and then it is ok to progress to your normal diet.  A half-sandwich or bowl of soup is an example of a good first meal.  Heavy or fried foods are harder to digest and may make you feel nauseous or bloated.  Likewise meals heavy in dairy and vegetables can cause extra gas to form and this can also increase the bloating.  Drink plenty of fluids but you should avoid alcoholic beverages for 24 hours.  ACTIVITY: Your care partner should take you home directly after the procedure.  You should plan to take it easy, moving slowly for the rest of the day.  You can resume normal activity the day after the procedure however  you should NOT DRIVE or use heavy machinery for 24 hours (because of the sedation medicines used during the test).    SYMPTOMS TO REPORT IMMEDIATELY: A gastroenterologist can be reached at any hour.  During normal business hours, 8:30 AM to 5:00 PM Monday through Friday, call [phone].  After hours and on weekends, please call the GI answering service at [phone] who will take a message and have the physician on call contact you.   Following lower endoscopy (colonoscopy or flexible sigmoidoscopy):  Excessive amounts of blood in the stool  Significant tenderness or worsening of abdominal pains  Swelling of the abdomen that is new, acute  Fever of 100F or higher  Following upper endoscopy (EGD)  Vomiting of blood or coffee ground material  New chest pain or pain under the shoulder blades  Painful or persistently difficult swallowing  New shortness of breath  Fever of 100F or higher  Black, tarry-looking stools  FOLLOW UP: If any biopsies were taken you will be contacted by phone or by letter within the next 1-3 weeks.  Call your gastroenterologist if you have not heard about the biopsies in 3 weeks.  Our staff will call the home number listed on your records the next business day following your procedure to check on you and address any questions or concerns that you may have at that time regarding the information given to you following your procedure. This is  a courtesy call and so if there is no answer at the home number and we have not heard from you through the emergency physician on call, we will assume that you have returned to your regular daily activities without incident.  SIGNATURES/CONFIDENTIALITY: You and/or your care partner have signed paperwork which will be entered into your electronic medical record.  These signatures attest to the fact that that the information above on your After Visit Summary has been reviewed and is understood.  Full responsibility of the  confidentiality of this discharge information lies with you and/or your care-partner.

## 2012-04-09 NOTE — Progress Notes (Signed)
Patient did not experience any of the following events: a burn prior to discharge; a fall within the facility; wrong site/side/patient/procedure/implant event; or a hospital transfer or hospital admission upon discharge from the facility. (G8907) Patient did not have preoperative order for IV antibiotic SSI prophylaxis. (G8918)  

## 2012-04-09 NOTE — Op Note (Signed)
[facility] [address] [place], [address]   COLONOSCOPY PROCEDURE REPORT  PATIENT: Kara, Wright  MR#: [id number] BIRTHDATE: 1945-11-21 , 66  yrs. old GENDER: Female ENDOSCOPIST: Roxy Cedar, MD REFERRED BY:.   Surveillance Program Recall PROCEDURE DATE:  04/09/2012 PROCEDURE:   Colonoscopy, high risk screening ASA CLASS:   Class II INDICATIONS:patient's immediate family history of colon cancer (father 36; last exam 2008 - negative). MEDICATIONS: MAC sedation, administered by CRNA and propofol (Diprivan) 200mg  IV  DESCRIPTION OF PROCEDURE:   After the risks benefits and alternatives of the procedure were thoroughly explained, informed consent was obtained.  A digital rectal exam revealed external hemorrhoids.   The LB [id number]  endoscope was introduced through the anus and advanced to the cecum, which was identified by both the appendix and ileocecal valve. No adverse events experienced.   The quality of the prep was excellent, using MoviPrep  The instrument was then slowly withdrawn as the colon was fully examined.      COLON FINDINGS: Mild diverticulosis was noted in the sigmoid colon. The colon was otherwise normal.  There was no  inflammation, polyps or cancers.  Retroflexed views revealed no abnormalities. The time to cecum=3 minutes 06 seconds.  Withdrawal time=7 minutes 54 seconds.  The scope was withdrawn and the procedure completed. COMPLICATIONS: There were no complications.  ENDOSCOPIC IMPRESSION: 1.   Mild diverticulosis was noted in the sigmoid colon 2.   The colon was otherwise normal  RECOMMENDATIONS: 1.  Follow up colonoscopy in 5 years 2.  Upper endoscopy today (see report)   eSigned:  Roxy Cedar, MD 04/09/2012 9:39 AM   cc: Crawford Givens, MD and The Patient

## 2012-04-09 NOTE — Progress Notes (Signed)
Pt requested the bed pan.  I assisted her with it.  Baby wipes offered.  Pt excepted. Maw  No complaints noted in the recovery room. Maw

## 2012-04-09 NOTE — Op Note (Signed)
[facility] [address] [place], [address]   ENDOSCOPY PROCEDURE REPORT  PATIENT: Kara, Wright  MR#: [id number] BIRTHDATE: 1945-10-01 , 66  yrs. old GENDER: Female ENDOSCOPIST: Roxy Cedar, MD REFERRED BY:  .  Self / Office PROCEDURE DATE:  04/09/2012 PROCEDURE:  EGD w/ biopsy ASA CLASS:     Class II INDICATIONS:  epigastric pain.   dysphagia.   history of esophageal reflux. MEDICATIONS: MAC sedation, administered by CRNA and propofol (Diprivan) 100mg  IV TOPICAL ANESTHETIC: none  DESCRIPTION OF PROCEDURE: After the risks benefits and alternatives of the procedure were thoroughly explained, informed consent was obtained.  The LB-GIF Q180 [id number] endoscope was introduced through the mouth and advanced to the second portion of the duodenum. Without limitations.  The instrument was slowly withdrawn as the mucosa was fully examined.    Distal esophagitis with edema and one small erosion.  No Barrett's or obvious stricture.  The gastric body / antrum reveal gastritis (erythema , granular, frible).  CLO bx taken.  The stomach was otherwise normal.  Normal duodenum.  Retroflexed views revealed no abnormalities.     The scope was then withdrawn from the patient and the procedure completed.  COMPLICATIONS: There were no complications. ENDOSCOPIC IMPRESSION: 1.Distal esophagitis 2.The gastric body / antrum reveal gastritis (erythema , granular, frible).  CLO bx taken. 3. Otherwise normal exam  RECOMMENDATIONS: 1.  Anti-reflux regimen to be followed 2.  INITIATE OMEPRAZOLE 20 MG DAILY (PREVIOSLY PRESCRIBED)  REPEAT EXAM:  eSigned:  Roxy Cedar, MD 04/09/2012 9:47 AM   XB:JYNWGN Para March, MD and The Patient

## 2012-04-10 ENCOUNTER — Telehealth: Payer: Self-pay | Admitting: *Deleted

## 2012-04-10 NOTE — Telephone Encounter (Signed)
  Follow up Call-  Call back number 04/09/2012  Post procedure Call Back phone  # [phone]  Permission to leave phone message Yes     Patient questions:  Do you have a fever, pain , or abdominal swelling? no Pain Score  0 *  Have you tolerated food without any problems? yes  Have you been able to return to your normal activities? yes  Do you have any questions about your discharge instructions: Diet   no Medications  no Follow up visit  no  Do you have questions or concerns about your Care? no  Actions: * If pain score is 4 or above: No action needed, pain <4.

## 2012-04-11 LAB — HELICOBACTER PYLORI SCREEN-BIOPSY: UREASE: NEGATIVE

## 2012-04-15 ENCOUNTER — Telehealth: Payer: Self-pay | Admitting: Cardiovascular Disease

## 2012-04-15 NOTE — Telephone Encounter (Signed)
Patient called back regarding lab results that were given to her on her machine.  She wants to discuss the cholesterol medication as she does not want to take anymore unless given a different medication.

## 2012-04-17 ENCOUNTER — Encounter: Payer: Self-pay | Admitting: Family Medicine

## 2012-04-17 DIAGNOSIS — K209 Esophagitis, unspecified without bleeding: Secondary | ICD-10-CM | POA: Insufficient documentation

## 2012-04-17 NOTE — Telephone Encounter (Signed)
Patient was having pain in LE with feeling fatigue on the Lipitor 10 mg one tablet daily. Patient would like to know if can change to a different medication. She did go off the Lipitor the day after her follow up visit with Dr. Mariah Milling. Patient does seem to be better off the Lipitor and the symptoms is better since had been off the Lipitor.

## 2012-04-21 NOTE — Telephone Encounter (Signed)
Would try simvastatin 20 mg daily Start 10 mg (1/2 dose ) for a few weeks

## 2012-04-22 ENCOUNTER — Other Ambulatory Visit: Payer: Self-pay

## 2012-04-22 MED ORDER — SIMVASTATIN 20 MG PO TABS: 10.0000 mg | ORAL_TABLET | Freq: Every day | ORAL | Status: DC

## 2012-04-22 NOTE — Telephone Encounter (Signed)
Pt informed Understanding verb New RX sent to pharm  

## 2012-04-22 NOTE — Telephone Encounter (Signed)
error 

## 2012-04-26 ENCOUNTER — Ambulatory Visit: Payer: BC Managed Care – PPO

## 2012-04-26 ENCOUNTER — Ambulatory Visit (INDEPENDENT_AMBULATORY_CARE_PROVIDER_SITE_OTHER): Payer: BC Managed Care – PPO

## 2012-04-26 DIAGNOSIS — Z23 Encounter for immunization: Secondary | ICD-10-CM

## 2012-05-06 ENCOUNTER — Ambulatory Visit: Payer: Self-pay | Admitting: Family Medicine

## 2012-05-06 ENCOUNTER — Encounter: Payer: Self-pay | Admitting: Family Medicine

## 2012-05-07 ENCOUNTER — Encounter: Payer: Self-pay | Admitting: *Deleted

## 2012-06-10 ENCOUNTER — Other Ambulatory Visit: Payer: Self-pay | Admitting: *Deleted

## 2012-06-10 DIAGNOSIS — R1310 Dysphagia, unspecified: Secondary | ICD-10-CM

## 2012-06-10 DIAGNOSIS — R110 Nausea: Secondary | ICD-10-CM

## 2012-06-10 DIAGNOSIS — K219 Gastro-esophageal reflux disease without esophagitis: Secondary | ICD-10-CM

## 2012-06-10 DIAGNOSIS — Z86010 Personal history of colonic polyps: Secondary | ICD-10-CM

## 2012-06-10 MED ORDER — OMEPRAZOLE 20 MG PO CPDR: 20.0000 mg | DELAYED_RELEASE_CAPSULE | Freq: Every day | ORAL | Status: DC

## 2012-06-10 NOTE — Telephone Encounter (Signed)
I have spoken to [facility] pharmacy. Patient was originally sent a script for omeprazole to her local pharmacy. However, [facility] and have [facility]/c any remaining rx for omeprazole with [facility].

## 2012-06-20 ENCOUNTER — Other Ambulatory Visit: Payer: Self-pay

## 2012-06-20 ENCOUNTER — Telehealth: Payer: Self-pay | Admitting: Cardiovascular Disease

## 2012-06-20 DIAGNOSIS — I4891 Unspecified atrial fibrillation: Secondary | ICD-10-CM

## 2012-06-20 NOTE — Telephone Encounter (Signed)
Get a 48 hour Holter monitor and then followup with Dr. Mariah Milling.

## 2012-06-20 NOTE — Telephone Encounter (Signed)
Pt informed Understanding verb Scheduled for 48 hour holter and appt with Dr. Mariah Milling

## 2012-06-20 NOTE — Telephone Encounter (Signed)
LMTCB

## 2012-06-20 NOTE — Telephone Encounter (Signed)
Pt says she is concerned b/c she has had an episode of atrial fib last month and then another episode 2 nights ago./ Says these are lasting about 2 hours each time and seem to be occuring more frequently. She says Dr. Mariah Milling told her ok to take diltiazem 30 mg PRN atrial fib. She took 2 tabs 2 nights ago but says this "did not help".  She only takes this PRN atrial fib. Feels "scared" when this happens with some chest tightness. Denies any symptoms today She is unsure as to if she has worn monitor in past I told her Dr. Mariah Milling out of office this week but will discuss with Dr. Kirke Corin and call her back.  Understanding verb.

## 2012-06-20 NOTE — Telephone Encounter (Signed)
C/o afib over the last few days. Pt states that they happen late at night and last for about an hour. Has had in past but only lasted a few mins.

## 2012-06-20 NOTE — Telephone Encounter (Signed)
Please see below Dr. Mariah Milling pt  thanks

## 2012-07-01 ENCOUNTER — Telehealth: Payer: Self-pay | Admitting: *Deleted

## 2012-07-01 ENCOUNTER — Encounter (INDEPENDENT_AMBULATORY_CARE_PROVIDER_SITE_OTHER): Payer: BC Managed Care – PPO

## 2012-07-01 DIAGNOSIS — R002 Palpitations: Secondary | ICD-10-CM

## 2012-07-01 DIAGNOSIS — I4891 Unspecified atrial fibrillation: Secondary | ICD-10-CM

## 2012-07-01 NOTE — Telephone Encounter (Signed)
Please see below and advise. thanks 

## 2012-07-01 NOTE — Telephone Encounter (Signed)
Pt mentioned that she has been having hand and feet pain with simvastatin 20 mg. She was having the same symptoms with the lipitor that she had taken before. She has started cutting tablet in half and still having same symptoms would like to know if she should continue medication. Please Advise.

## 2012-07-01 NOTE — Telephone Encounter (Signed)
We could try Crestor 5 mg daily Start every other day for several weeks then go to every day Come in for samples

## 2012-07-01 NOTE — Telephone Encounter (Signed)
LMTCB

## 2012-07-02 ENCOUNTER — Other Ambulatory Visit: Payer: Self-pay | Admitting: Family Medicine

## 2012-07-02 ENCOUNTER — Other Ambulatory Visit: Payer: Self-pay

## 2012-07-02 DIAGNOSIS — I4891 Unspecified atrial fibrillation: Secondary | ICD-10-CM

## 2012-07-02 DIAGNOSIS — E039 Hypothyroidism, unspecified: Secondary | ICD-10-CM

## 2012-07-02 DIAGNOSIS — M8580 Other specified disorders of bone density and structure, unspecified site: Secondary | ICD-10-CM

## 2012-07-02 MED ORDER — ROSUVASTATIN CALCIUM 5 MG PO TABS: ORAL_TABLET | ORAL | Status: DC

## 2012-07-02 NOTE — Telephone Encounter (Signed)
Pt informed Understanding verb Samples left at [facility]

## 2012-07-02 NOTE — Progress Notes (Unsigned)
48 hour holter monitor placed by [facility] 07/01/2012 to 07/03/2012.

## 2012-07-08 ENCOUNTER — Other Ambulatory Visit (INDEPENDENT_AMBULATORY_CARE_PROVIDER_SITE_OTHER): Payer: BC Managed Care – PPO

## 2012-07-08 ENCOUNTER — Ambulatory Visit (INDEPENDENT_AMBULATORY_CARE_PROVIDER_SITE_OTHER): Payer: BC Managed Care – PPO | Admitting: Cardiovascular Disease

## 2012-07-08 ENCOUNTER — Encounter: Payer: Self-pay | Admitting: Cardiovascular Disease

## 2012-07-08 ENCOUNTER — Encounter: Payer: Self-pay | Admitting: *Deleted

## 2012-07-08 ENCOUNTER — Ambulatory Visit: Payer: BC Managed Care – PPO | Admitting: Cardiovascular Disease

## 2012-07-08 VITALS — BP 130/62 | HR 82 | Ht 65.0 in | Wt 228.0 lb

## 2012-07-08 DIAGNOSIS — I4891 Unspecified atrial fibrillation: Secondary | ICD-10-CM

## 2012-07-08 DIAGNOSIS — R002 Palpitations: Secondary | ICD-10-CM

## 2012-07-08 DIAGNOSIS — M8580 Other specified disorders of bone density and structure, unspecified site: Secondary | ICD-10-CM

## 2012-07-08 DIAGNOSIS — E785 Hyperlipidemia, unspecified: Secondary | ICD-10-CM

## 2012-07-08 DIAGNOSIS — I10 Essential (primary) hypertension: Secondary | ICD-10-CM

## 2012-07-08 DIAGNOSIS — E039 Hypothyroidism, unspecified: Secondary | ICD-10-CM

## 2012-07-08 LAB — BASIC METABOLIC PANEL
CO2: 31 mEq/L (ref 19–32)
Calcium: 8.9 mg/dL (ref 8.4–10.5)
Chloride: 102 mEq/L (ref 96–112)
Sodium: 139 mEq/L (ref 135–145)

## 2012-07-08 LAB — CBC WITH DIFFERENTIAL/PLATELET
Eosinophils Relative: 2.8 % (ref 0.0–5.0)
Monocytes Absolute: 0.4 10*3/uL (ref 0.1–1.0)
Monocytes Relative: 6.9 % (ref 3.0–12.0)
Neutrophils Relative %: 59.9 % (ref 43.0–77.0)
Platelets: 161 10*3/uL (ref 150.0–400.0)
WBC: 5.6 10*3/uL (ref 4.5–10.5)

## 2012-07-08 LAB — TSH: TSH: 0.31 u[IU]/mL — ABNORMAL LOW (ref 0.35–5.50)

## 2012-07-08 MED ORDER — PROPRANOLOL HCL ER 120 MG PO CP24: 120.0000 mg | ORAL_CAPSULE | Freq: Every day | ORAL | Status: DC

## 2012-07-08 MED ORDER — DILTIAZEM HCL 30 MG PO TABS: 30.0000 mg | ORAL_TABLET | Freq: Four times a day (QID) | ORAL | Status: DC | PRN

## 2012-07-08 MED ORDER — PROPRANOLOL HCL 20 MG PO TABS: 20.0000 mg | ORAL_TABLET | Freq: Three times a day (TID) | ORAL | Status: DC | PRN

## 2012-07-08 NOTE — Assessment & Plan Note (Addendum)
No documentation of atrial fibrillation but she does report having 2 episodes of tachycardia lasting 2-3 hours. Short runs of SVT on her Holter monitor. Uncertain what arrhythmia she had earlier in the month. We will increase her propranolol to 120 mg daily, have also given her diltiazem 30 mg tablets to take as needed with propranolol 20 mg tablets to take for breakthrough arrhythmia. If symptoms continue to happen, we could consider increasing her propranolol to 180 mg daily. Most of her symptoms seem to happen at nighttime and she could try taking long-acting propranolol at dinner.  TSH is low. Perhaps she could take 175 mcg daily. Will defer to Dr. Para March.

## 2012-07-08 NOTE — Progress Notes (Signed)
Patient ID: Kara Wright, female    DOB: 07/12/1945, 67 y.o.   MRN: [id number]  HPI Comments: [name] is a very pleasant 67 year old woman with obesity, hypothyroidism, remote smoking history for less than 10 years, prior TIA.    08/25/2011,  right eye vision changes, right arm weakness and numbness lasting for 30-40 minutes. Symptoms seemed to resolve. The next day, on Saturday, she had some mouth and facial numbness and she went to the emergency room. She also had headache. They did a EKG and CT scan of the head that showed no stroke. Early in the week, she continued to have headache and mouth issues and she was taken to [facility]. She had MRA that showed small vessel disease, carotid ultrasound that showed mild plaque. She reports that she was in the hospital for 3 days. No atrial fibrillation was seen.   Echocardiogram was essentially normal. Last episode of documented atrial fibrillation was May 2012.   She reports that she has had 2 episodes of tachycardia one in December 2013 and 1 at the beginning of 2014. Each episode lasted 2-3 hours long. It resolved without intervention. She has had significant stress. Her husband fell out of a tree and broke his leg and has required extensive frequent surgeries. She's not sleeping well on some nights. Diet and exercise have not been her usual. Recent blood work shows TSH 0.31, glucose level high   she did wear a today Holter monitor that showed short periods of supraventricular tachycardia, rare APCs, long stretches of atrial fibrillation. She did not have any symptoms while wearing the monitor  EKG today shows normal sinus rhythm with rate 82 beats per minute with no significant ST or T wave changes    Outpatient Encounter Prescriptions as of 07/08/2012  Medication Sig Dispense Refill  . aspirin EC 81 MG tablet Take 81 mg by mouth 2 (two) times daily.       . cholecalciferol (VITAMIN D) 1000 UNITS tablet Take by mouth. Take two tablets  daily      . clobetasol cream (TEMOVATE) 0.05 % Apply 1 application topically 2 (two) times daily as needed. For rash      . levothyroxine (SYNTHROID, LEVOTHROID) 200 MCG tablet Take 200 mcg by mouth daily. Except for on Sunday      . Multiple Vitamins-Minerals (CENTRUM SILVER ULTRA WOMENS PO) Take by mouth daily.       Marland Kitchen omeprazole (PRILOSEC) 20 MG capsule Take 1 capsule (20 mg total) by mouth daily.  180 capsule  1  . promethazine (PHENERGAN) 25 MG tablet Take 1 tablet (25 mg total) by mouth every 6 (six) hours as needed. For nausea  30 tablet  3  . propranolol ER (INDERAL LA) 120 MG 24 hr capsule Take 1 capsule (120 mg total) by mouth daily.  30 capsule  6  . rosuvastatin (CRESTOR) 5 MG tablet Take 1 tablet every other day for a few weeks then take 1 tablet daily if you tolerate  28 tablet  0  . vitamin B-12 (CYANOCOBALAMIN) 500 MCG tablet Take 500 mcg by mouth daily.      . [DISCONTINUED] propranolol (INDERAL LA) 80 MG 24 hr capsule Take 80 mg by mouth daily.      Marland Kitchen diltiazem (CARDIZEM) 30 MG tablet Take 1 tablet (30 mg total) by mouth 4 (four) times daily as needed.  90 tablet  6  . propranolol (INDERAL) 20 MG tablet Take 1 tablet (20 mg total) by  mouth 3 (three) times daily as needed.  60 tablet  3  . [DISCONTINUED] dexlansoprazole (DEXILANT) 60 MG capsule Take 1 capsule (60 mg total) by mouth daily.  5 capsule  0     Review of Systems  Constitutional: Negative.   HENT: Negative.   Eyes: Negative.   Respiratory: Negative.   Cardiovascular: Positive for palpitations.       2 episodes of tachycardia  Gastrointestinal: Negative.   Musculoskeletal: Negative.   Skin: Negative.   Neurological: Negative.   Hematological: Negative.   Psychiatric/Behavioral: Positive for sleep disturbance. The patient is nervous/anxious.   All other systems reviewed and are negative.    BP 130/62  Pulse 82  Ht 5\' 5"  (1.651 m)  Wt 228 lb (103.42 kg)  BMI 37.94 kg/m2  Physical Exam  Nursing  note and vitals reviewed. Constitutional: She is oriented to person, place, and time. She appears well-developed and well-nourished.  HENT:  Head: Normocephalic.  Nose: Nose normal.  Mouth/Throat: Oropharynx is clear and moist.  Eyes: Conjunctivae normal are normal. Pupils are equal, round, and reactive to light.  Neck: Normal range of motion. Neck supple. No JVD present.  Cardiovascular: Normal rate, regular rhythm, S1 normal, S2 normal, normal heart sounds and intact distal pulses.  Exam reveals no gallop and no friction rub.   No murmur heard. Pulmonary/Chest: Effort normal and breath sounds normal. No respiratory distress. She has no wheezes. She has no rales. She exhibits no tenderness.  Abdominal: Soft. Bowel sounds are normal. She exhibits no distension. There is no tenderness.  Musculoskeletal: Normal range of motion. She exhibits no edema and no tenderness.  Lymphadenopathy:    She has no cervical adenopathy.  Neurological: She is alert and oriented to person, place, and time. Coordination normal.  Skin: Skin is warm and dry. No rash noted. No erythema.  Psychiatric: She has a normal mood and affect. Her behavior is normal. Judgment and thought content normal.         Assessment and Plan

## 2012-07-08 NOTE — Assessment & Plan Note (Signed)
Propranolol dose increased today

## 2012-07-08 NOTE — Assessment & Plan Note (Signed)
We have encouraged continued exercise, careful diet management in an effort to lose weight. 

## 2012-07-08 NOTE — Patient Instructions (Addendum)
Please increase the propranolol to 120 mg daily Take propranolol 20 mg pill and diltiazem 30 mg pill for breakthrough palpitations  Please call us if you have new issues that need to be addressed before your next appt.  Your physician wants you to follow-up in: 3 months.  You will receive a reminder letter in the mail two months in advance. If you don't receive a letter, please call our office to schedule the follow-up appointment.

## 2012-07-09 LAB — VITAMIN D 25 HYDROXY (VIT D DEFICIENCY, FRACTURES): Vit D, 25-Hydroxy: 28 ng/mL — ABNORMAL LOW (ref 30–89)

## 2012-07-11 ENCOUNTER — Encounter: Payer: BC Managed Care – PPO | Admitting: Family Medicine

## 2012-07-16 ENCOUNTER — Encounter: Payer: Self-pay | Admitting: Family Medicine

## 2012-07-16 ENCOUNTER — Ambulatory Visit (INDEPENDENT_AMBULATORY_CARE_PROVIDER_SITE_OTHER): Payer: BC Managed Care – PPO | Admitting: Family Medicine

## 2012-07-16 VITALS — BP 126/70 | HR 70 | Temp 98.2°F | Ht 64.75 in | Wt 227.8 lb

## 2012-07-16 DIAGNOSIS — F411 Generalized anxiety disorder: Secondary | ICD-10-CM

## 2012-07-16 DIAGNOSIS — F419 Anxiety disorder, unspecified: Secondary | ICD-10-CM

## 2012-07-16 DIAGNOSIS — E785 Hyperlipidemia, unspecified: Secondary | ICD-10-CM

## 2012-07-16 DIAGNOSIS — Z0000 Encounter for general adult medical examination without abnormal findings: Secondary | ICD-10-CM

## 2012-07-16 DIAGNOSIS — R739 Hyperglycemia, unspecified: Secondary | ICD-10-CM

## 2012-07-16 DIAGNOSIS — Z23 Encounter for immunization: Secondary | ICD-10-CM

## 2012-07-16 DIAGNOSIS — R7309 Other abnormal glucose: Secondary | ICD-10-CM

## 2012-07-16 DIAGNOSIS — E039 Hypothyroidism, unspecified: Secondary | ICD-10-CM

## 2012-07-16 DIAGNOSIS — I4891 Unspecified atrial fibrillation: Secondary | ICD-10-CM

## 2012-07-16 DIAGNOSIS — G459 Transient cerebral ischemic attack, unspecified: Secondary | ICD-10-CM

## 2012-07-16 MED ORDER — CITALOPRAM HYDROBROMIDE 20 MG PO TABS: 20.0000 mg | ORAL_TABLET | Freq: Every day | ORAL | Status: DC

## 2012-07-16 MED ORDER — LEVOTHYROXINE SODIUM 200 MCG PO TABS: 200.0000 ug | ORAL_TABLET | Freq: Every day | ORAL | Status: DC

## 2012-07-16 NOTE — Assessment & Plan Note (Signed)
Okay for outpatient f/u.  Start SSRI with routine cautions and she'll call back as needed with update.  No SI/HI.  She is understandably pressured with work and home concerns.

## 2012-07-16 NOTE — Assessment & Plan Note (Signed)
Routine anticipatory guidance given to patient.  See health maintenance. Flu up to date.  Td 2013 zostavax 2008 PNA today.  colonoscopy 2013 DXA 2013 Mammogram up to date Pap not indicated.  D/w pt about diet and exercise.  Living will d/wpt.  Husband designated if incapacitated.

## 2012-07-16 NOTE — Patient Instructions (Addendum)
Recheck TSH in 2 months.  I would try taking the citalopram 20mg  a day.  Let me know if that doesn't help.   Take care.  Glad to see you.

## 2012-07-16 NOTE — Assessment & Plan Note (Signed)
Sounds to be RRR today, continue as is.

## 2012-07-16 NOTE — Assessment & Plan Note (Signed)
D/w pt about diet and exercise, she'll cut out sweets and we'll follow episodically.

## 2012-07-16 NOTE — Assessment & Plan Note (Signed)
Tolerating crestor, continue as is.  Labs discussed.

## 2012-07-16 NOTE — Assessment & Plan Note (Signed)
Cut out 1/2 tab on Sundays, recheck in 2 months.

## 2012-07-16 NOTE — Progress Notes (Signed)
CPE- See plan.  Routine anticipatory guidance given to patient.  See health maintenance. Flu up to date.  Td 2013 zostavax 2008 PNA today.  colonoscopy 2013 DXA 2013 Mammogram up to date Pap not indicated.  D/w pt about diet and exercise.  Living will d/wpt.  Husband designated if incapacitated.   Hypothyroid.  overreplaced by TSH.  She had been taking full tab on Sundays, not 1/2 tab.  No neck mass.  Labs discussed.   Elevated Cholesterol: Using medications without problems:y Muscle aches: limited, improved from other statins Diet compliance: partial, discussed Exercise:limited  AF.  No recent episodes, no CP, not SOB.  Doing well overall.   Anxiety.  Related to work, husband's illness and his mult surgeries.  Feels overwhelmed.  No SI/HI.  Asking about options.    PMH and SH reviewed  Meds, vitals, and allergies reviewed.   ROS: See HPI.  Otherwise negative.    GEN: nad, alert and oriented HEENT: mucous membranes moist NECK: supple w/o LA, no TMG CV: rrr. PULM: ctab, no inc wob ABD: soft, +bs EXT: no edema SKIN: no acute rash

## 2012-09-10 ENCOUNTER — Other Ambulatory Visit: Payer: Self-pay

## 2012-09-10 MED ORDER — ROSUVASTATIN CALCIUM 5 MG PO TABS: ORAL_TABLET | ORAL | Status: DC

## 2012-09-10 MED ORDER — PROPRANOLOL HCL ER 120 MG PO CP24: 120.0000 mg | ORAL_CAPSULE | Freq: Every day | ORAL | Status: DC

## 2012-09-10 NOTE — Telephone Encounter (Signed)
Pt needs refil

## 2012-09-10 NOTE — Telephone Encounter (Signed)
Refilled Propranolol and Crestor.

## 2012-09-13 ENCOUNTER — Other Ambulatory Visit: Payer: BC Managed Care – PPO

## 2012-09-13 ENCOUNTER — Other Ambulatory Visit: Payer: Self-pay

## 2012-09-13 MED ORDER — PROPRANOLOL HCL ER 120 MG PO CP24: 120.0000 mg | ORAL_CAPSULE | Freq: Every day | ORAL | Status: DC

## 2012-09-13 MED ORDER — CITALOPRAM HYDROBROMIDE 20 MG PO TABS: 20.0000 mg | ORAL_TABLET | Freq: Every day | ORAL | Status: DC

## 2012-09-13 NOTE — Telephone Encounter (Signed)
Refill sent for propranolol 120 mg

## 2012-09-13 NOTE — Telephone Encounter (Signed)
[facility] citalopram 20 mg. # 90 x 3 sent electronically. [facility] cancelled Citalopram 20 mg refills.

## 2012-09-16 ENCOUNTER — Other Ambulatory Visit (INDEPENDENT_AMBULATORY_CARE_PROVIDER_SITE_OTHER): Payer: BC Managed Care – PPO

## 2012-09-16 DIAGNOSIS — E039 Hypothyroidism, unspecified: Secondary | ICD-10-CM

## 2012-09-17 ENCOUNTER — Other Ambulatory Visit: Payer: Self-pay | Admitting: Family Medicine

## 2012-09-17 DIAGNOSIS — E039 Hypothyroidism, unspecified: Secondary | ICD-10-CM

## 2012-09-17 MED ORDER — LEVOTHYROXINE SODIUM 200 MCG PO TABS: 200.0000 ug | ORAL_TABLET | Freq: Every day | ORAL | Status: DC

## 2012-09-23 ENCOUNTER — Other Ambulatory Visit: Payer: Self-pay | Admitting: *Deleted

## 2012-09-23 MED ORDER — ROSUVASTATIN CALCIUM 5 MG PO TABS: 5.0000 mg | ORAL_TABLET | Freq: Every day | ORAL | Status: DC

## 2012-09-23 NOTE — Telephone Encounter (Signed)
PT IS OUT OF MEDICATION.

## 2012-09-23 NOTE — Telephone Encounter (Signed)
PT NEEDS 30 DAY SENT TO [facility] UNTIL MAIL ORDER FILLED

## 2012-09-30 ENCOUNTER — Ambulatory Visit (INDEPENDENT_AMBULATORY_CARE_PROVIDER_SITE_OTHER): Payer: BC Managed Care – PPO | Admitting: Family Medicine

## 2012-09-30 ENCOUNTER — Encounter: Payer: Self-pay | Admitting: Family Medicine

## 2012-09-30 VITALS — BP 132/86 | HR 74 | Temp 97.9°F | Wt 208.8 lb

## 2012-09-30 DIAGNOSIS — K5289 Other specified noninfective gastroenteritis and colitis: Secondary | ICD-10-CM

## 2012-09-30 DIAGNOSIS — K529 Noninfective gastroenteritis and colitis, unspecified: Secondary | ICD-10-CM | POA: Insufficient documentation

## 2012-09-30 MED ORDER — PROMETHAZINE HCL 25 MG PO TABS: 25.0000 mg | ORAL_TABLET | Freq: Four times a day (QID) | ORAL | Status: DC | PRN

## 2012-09-30 NOTE — Progress Notes (Signed)
She felt nauseated Friday PM. Early Sat AM she woke suddenly and started vomiting.  Vomited throughout the day.  Vomiting is done now.  Still with nausea. Watery diarrhea started Saturday night.  No blood in diarrhea.  Drinking ginger ale and taking some crackers. She has taking phenergan for nausea, vomited it up initially.  The last few doses were tolerated and helped the nausea some.  Prev with fevers, up to 101, none in the last day or so.  Her legs are sore.  No known sick contacts. Last UOP was recent, slightly darker than normal.   Meds, vitals, and allergies reviewed.   ROS: See HPI.  Otherwise, noncontributory.  GEN: nad, alert and oriented HEENT: mucous membranes moist NECK: supple w/o LA CV: rrr PULM: ctab, no inc wob ABD: soft, +bs, abd wall diffusely/slightly ttp, no rebound EXT: no edema SKIN: no acute rash

## 2012-09-30 NOTE — Patient Instructions (Signed)
Drink fluids and eat small amounts of bland food. Avoid dairy.  Wash your hands frequently.  Take care.  This should slowly improve.

## 2012-09-30 NOTE — Assessment & Plan Note (Signed)
In community recently.  D/w pt about path/phys.  Not dehydrated appearing.  Fluids and phenergan in meantime.  Hand washing.  F/u prn.  She agrees.

## 2012-10-14 ENCOUNTER — Ambulatory Visit (INDEPENDENT_AMBULATORY_CARE_PROVIDER_SITE_OTHER): Payer: BC Managed Care – PPO | Admitting: Cardiovascular Disease

## 2012-10-14 ENCOUNTER — Encounter: Payer: Self-pay | Admitting: Cardiovascular Disease

## 2012-10-14 VITALS — BP 130/74 | HR 62 | Ht 65.0 in | Wt 219.8 lb

## 2012-10-14 DIAGNOSIS — R000 Tachycardia, unspecified: Secondary | ICD-10-CM

## 2012-10-14 DIAGNOSIS — E669 Obesity, unspecified: Secondary | ICD-10-CM

## 2012-10-14 DIAGNOSIS — E785 Hyperlipidemia, unspecified: Secondary | ICD-10-CM

## 2012-10-14 DIAGNOSIS — I4891 Unspecified atrial fibrillation: Secondary | ICD-10-CM

## 2012-10-14 DIAGNOSIS — I10 Essential (primary) hypertension: Secondary | ICD-10-CM

## 2012-10-14 MED ORDER — ROSUVASTATIN CALCIUM 10 MG PO TABS: 10.0000 mg | ORAL_TABLET | Freq: Every day | ORAL | Status: DC

## 2012-10-14 NOTE — Assessment & Plan Note (Signed)
Cost of the Crestor is very expensive. She would like to cut the medication in half. Cholesterol on Crestor 5 mg daily is well controlled. Will give some samples today

## 2012-10-14 NOTE — Assessment & Plan Note (Signed)
We have encouraged continued exercise, careful diet management in an effort to lose weight. 

## 2012-10-14 NOTE — Assessment & Plan Note (Signed)
Blood pressure is well controlled on today's visit. No changes made to the medications. 

## 2012-10-14 NOTE — Patient Instructions (Addendum)
You are doing well. No medication changes were made.  Please call us if you have new issues that need to be addressed before your next appt.  Your physician wants you to follow-up in: 12 months.  You will receive a reminder letter in the mail two months in advance. If you don't receive a letter, please call our office to schedule the follow-up appointment. 

## 2012-10-14 NOTE — Assessment & Plan Note (Signed)
Rare episodes of atrial fibrillation since her last clinic visit. No changes to her medications. We have asked her to call the office if she has more frequent or longer episodes

## 2012-10-14 NOTE — Progress Notes (Signed)
Patient ID: Kara Wright, female    DOB: [birth year], 67 y.o.   MRN: [id number]  HPI Comments: Kara Wright is a very pleasant 67 year old woman with obesity, hypothyroidism, remote smoking history for less than 10 years, prior TIA.  History of atrial fibrillation seen on Holter monitor   08/25/2011,  right eye vision changes, right arm weakness and numbness lasting for 30-40 minutes. Symptoms seemed to resolve. The next day, on Saturday, she had some mouth and facial numbness and she went to the emergency room. She also had headache. They did a EKG and CT scan of the head that showed no stroke. Early in the week, she continued to have headache and mouth issues and she was taken to [facility]. She had MRA that showed small vessel disease, carotid ultrasound that showed mild plaque. She reports that she was in the hospital for 3 days. No atrial fibrillation was seen.   Echocardiogram was essentially normal.  She reports 2 or 3 episodes of tachycardia lasting less than 1 minute since her last clinic visit. Otherwise she feels well. Tolerating Crestor 5 mg daily. Recent fall while moving a table, injury to her left knee and right wrist. Sleeping well, not doing regular exercise  Previous Holter monitor that showed short periods of supraventricular tachycardia, rare APCs, long stretches of atrial fibrillation. She did not have any symptoms while wearing the monitor  EKG today shows normal sinus rhythm with rate 62 beats per minute with no significant ST or T wave changes    Outpatient Encounter Prescriptions as of 10/14/2012  Medication Sig Dispense Refill  . aspirin EC 81 MG tablet Take 81 mg by mouth 2 (two) times daily.       . cholecalciferol (VITAMIN D) 1000 UNITS tablet Take by mouth. Take two tablets daily      . citalopram (CELEXA) 20 MG tablet Take 1 tablet (20 mg total) by mouth daily.  90 tablet  3  . clobetasol cream (TEMOVATE) 0.05 % Apply 1 application topically 2 (two) times daily  as needed. For rash      . diltiazem (CARDIZEM) 30 MG tablet Take 1 tablet (30 mg total) by mouth 4 (four) times daily as needed.  90 tablet  6  . levothyroxine (SYNTHROID, LEVOTHROID) 200 MCG tablet Take 1 tablet (200 mcg total) by mouth daily. Except for on Sundays and Wednesdays.      . Multiple Vitamins-Minerals (CENTRUM SILVER ULTRA WOMENS PO) Take by mouth daily.       Marland Kitchen omeprazole (PRILOSEC) 20 MG capsule Take 1 capsule (20 mg total) by mouth daily.  180 capsule  1  . promethazine (PHENERGAN) 25 MG tablet Take 1 tablet (25 mg total) by mouth every 6 (six) hours as needed. For nausea  30 tablet  3  . propranolol (INDERAL) 20 MG tablet Take 20 mg by mouth as needed.      . propranolol ER (INDERAL LA) 120 MG 24 hr capsule Take 1 capsule (120 mg total) by mouth daily.  90 capsule  3  . rosuvastatin (CRESTOR) 10 MG tablet Take 1 tablet (10 mg total) by mouth daily.  90 tablet  3  . vitamin B-12 (CYANOCOBALAMIN) 500 MCG tablet Take 500 mcg by mouth daily.      . [DISCONTINUED] rosuvastatin (CRESTOR) 10 MG tablet Take 1 tablet (10 mg total) by mouth daily.  90 tablet  3  . [DISCONTINUED] rosuvastatin (CRESTOR) 5 MG tablet Take 1 tablet (5 mg total) by  mouth daily. Take 1 tablet every other day for a few weeks then take 1 tablet daily if you tolerate  90 tablet  2  . [DISCONTINUED] propranolol (INDERAL) 20 MG tablet Take 1 tablet (20 mg total) by mouth 3 (three) times daily as needed.  60 tablet  3   No facility-administered encounter medications on file as of 10/14/2012.     Review of Systems  Constitutional: Negative.   HENT: Negative.   Eyes: Negative.   Respiratory: Negative.   Cardiovascular: Positive for palpitations.       2 episodes of tachycardia  Gastrointestinal: Negative.   Musculoskeletal: Negative.   Skin: Negative.   Neurological: Negative.   Psychiatric/Behavioral: Positive for sleep disturbance. The patient is nervous/anxious.   All other systems reviewed and are  negative.    BP 130/74  Pulse 62  Ht 5\' 5"  (1.651 m)  Wt 219 lb 12 oz (99.678 kg)  BMI 36.57 kg/m2  Physical Exam  Nursing note and vitals reviewed. Constitutional: She is oriented to person, place, and time. She appears well-developed and well-nourished.  HENT:  Head: Normocephalic.  Nose: Nose normal.  Mouth/Throat: Oropharynx is clear and moist.  Eyes: Conjunctivae are normal. Pupils are equal, round, and reactive to light.  Neck: Normal range of motion. Neck supple. No JVD present.  Cardiovascular: Normal rate, regular rhythm, S1 normal, S2 normal, normal heart sounds and intact distal pulses.  Exam reveals no gallop and no friction rub.   No murmur heard. Pulmonary/Chest: Effort normal and breath sounds normal. No respiratory distress. She has no wheezes. She has no rales. She exhibits no tenderness.  Abdominal: Soft. Bowel sounds are normal. She exhibits no distension. There is no tenderness.  Musculoskeletal: Normal range of motion. She exhibits no edema and no tenderness.  Lymphadenopathy:    She has no cervical adenopathy.  Neurological: She is alert and oriented to person, place, and time. Coordination normal.  Skin: Skin is warm and dry. No rash noted. No erythema.  Psychiatric: She has a normal mood and affect. Her behavior is normal. Judgment and thought content normal.    Assessment and Plan

## 2012-11-04 ENCOUNTER — Other Ambulatory Visit (INDEPENDENT_AMBULATORY_CARE_PROVIDER_SITE_OTHER): Payer: BC Managed Care – PPO

## 2012-11-04 ENCOUNTER — Other Ambulatory Visit: Payer: BC Managed Care – PPO

## 2012-11-04 DIAGNOSIS — E039 Hypothyroidism, unspecified: Secondary | ICD-10-CM

## 2012-11-04 LAB — TSH: TSH: 0.85 u[IU]/mL (ref 0.35–5.50)

## 2012-11-05 ENCOUNTER — Encounter: Payer: Self-pay | Admitting: *Deleted

## 2012-12-11 ENCOUNTER — Telehealth: Payer: Self-pay | Admitting: Family Medicine

## 2012-12-11 NOTE — Telephone Encounter (Signed)
She needs to get checked.  I do not think it is prudent to call a med in (in this case) w/o checking her first.  She can use OTC cough medicine in the meantime, but would avoid decongestants due to her history.

## 2012-12-11 NOTE — Telephone Encounter (Signed)
Patient notified as instructed by telephone.  Offered patient an appointment with Dr. Patsy Lager at 12:30 tomorrow (Thursday), which declined stating that she had to be at work at 1:00. Offered patient an appointment on Friday and she stated that she could not come that day because she had to work all day.  Patient stated that she will get some OTC cough medication.  Advised patient she can try to call first thing in the morning and see if there have been any cancellations that will suit her schedule. Patient agreed.

## 2012-12-11 NOTE — Telephone Encounter (Signed)
Patient Information:  Caller Name: Senetra  Phone: [phone]  Patient: Kara Wright, Kara Wright  Gender: Female  DOB: January 08, 1946  Age: 67 Years  PCP: Crawford Givens Clelia Croft) [facility])  Office Follow Up:  Does the office need to follow up with this patient?: Yes  Instructions For The Office: notify Leilynn when prescription is called in to pharmacy   Symptoms  Reason For Call & Symptoms: Marline states she went on a mission trip to [place] . Had onset of sore throat and hoarseness on 12/05/12. C/o chest congestion with productive cough with thick yellow tan mucus. States cough is worse at night. Itchy eyes at times.Per cough protocol has see within 3 days disposition due to allergy symptoms present. Declines appointment - requesting prescription be called in to [facility] in [place] [phone].  Reviewed Health History In EMR: Yes  Reviewed Medications In EMR: Yes  Reviewed Allergies In EMR: Yes  Reviewed Surgeries / Procedures: Yes  Date of Onset of Symptoms: 12/05/2012  Treatments Tried: Mucinex  Treatments Tried Worked: No  Guideline(s) Used:  Cough  Disposition Per Guideline:   See Within 3 Days in Office  Reason For Disposition Reached:   Allergy symptoms are also present (e.g., itchy eyes, clear nasal discharge, postnasal drip)  Advice Given:  Reassurance  Coughing is the way that our lungs remove irritants and mucus. It helps protect our lungs from getting pneumonia.  You can get a dry hacking cough after a chest cold. Sometimes this type of cough can last 1-3 weeks, and be worse at night.  Coughing Spasms:  Drink warm fluids. Inhale warm mist (Reason: both relax the airway and loosen up the phlegm).  Suck on cough drops or hard candy to coat the irritated throat.  Prevent Dehydration:  Drink adequate liquids.  This will help soothe an irritated or dry throat and loosen up the phlegm.  Expected Course:   The expected course depends on what is causing the cough.  Viral bronchitis (chest cold) causes a cough that lasts 1 to 3 weeks. Sometimes you may cough up lots of phlegm (sputum, mucus). The mucus can normally be white, gray, yellow, or green.  Call Back If:  Difficulty breathing  Cough lasts more than 3 weeks  Fever lasts > 3 days  Patient Refused Recommendation:  Patient Requests Prescription  scheduled to work 6/26 and 6/27

## 2012-12-16 ENCOUNTER — Ambulatory Visit: Payer: BC Managed Care – PPO | Admitting: Family Medicine

## 2013-01-01 ENCOUNTER — Ambulatory Visit (INDEPENDENT_AMBULATORY_CARE_PROVIDER_SITE_OTHER): Payer: BC Managed Care – PPO | Admitting: Family Medicine

## 2013-01-01 ENCOUNTER — Encounter: Payer: Self-pay | Admitting: Family Medicine

## 2013-01-01 VITALS — BP 118/70 | HR 60 | Temp 98.2°F | Wt 211.8 lb

## 2013-01-01 DIAGNOSIS — R05 Cough: Secondary | ICD-10-CM

## 2013-01-01 DIAGNOSIS — R059 Cough, unspecified: Secondary | ICD-10-CM | POA: Insufficient documentation

## 2013-01-01 MED ORDER — DOXYCYCLINE HYCLATE 100 MG PO TABS: 100.0000 mg | ORAL_TABLET | Freq: Two times a day (BID) | ORAL | Status: DC

## 2013-01-01 MED ORDER — HYDROCODONE-HOMATROPINE 5-1.5 MG/5ML PO SYRP: 5.0000 mL | ORAL_SOLUTION | Freq: Three times a day (TID) | ORAL | Status: DC | PRN

## 2013-01-01 NOTE — Patient Instructions (Addendum)
Start the cough medicine today, get some rest and drink plenty of fluids.  If not improved in a few days, then start the antibiotics.  Take care.  Glad to see you.

## 2013-01-01 NOTE — Assessment & Plan Note (Signed)
Lungs totally ctab.  Okay for outpatient f/u.  Nontoxic.  supportive tx with hycodan (sedation caution) for now and then start doxy in ~2 days if not improving.  She agrees.  F/u prn.

## 2013-01-01 NOTE — Progress Notes (Signed)
Sx started about 5 days ago.  First noted cough and congestion.  Since then inc in cough, fatigued.  Discolored sputum. Distant smoker, several year period of time.  No fevers but had sweats and chills at night.  Cough is worse at night.  No ear pain, no ST.  No vomiting, no diarrhea, no rash.    Meds, vitals, and allergies reviewed.   ROS: See HPI.  Otherwise, noncontributory.  GEN: nad, alert and oriented HEENT: mucous membranes moist, tm w/o erythema, nasal exam w/o erythema, scant clear discharge noted, OP with very mild cobblestoning, sinuses not ttp x4 NECK: supple w/o LA CV: rrr.   PULM: totally ctab, no inc wob, occ cough noted EXT: no edema SKIN: no acute rash

## 2013-04-08 ENCOUNTER — Telehealth: Payer: Self-pay

## 2013-04-08 NOTE — Telephone Encounter (Signed)
Pt left v/m requesting appt for flu vaccine. Called pt back and pt had already cb and scheduled flu vaccine clinic on 04/17/13 at 9:30 am.

## 2013-04-17 ENCOUNTER — Ambulatory Visit: Payer: BC Managed Care – PPO

## 2013-04-24 ENCOUNTER — Other Ambulatory Visit: Payer: Self-pay

## 2013-05-01 ENCOUNTER — Ambulatory Visit (INDEPENDENT_AMBULATORY_CARE_PROVIDER_SITE_OTHER): Payer: BC Managed Care – PPO

## 2013-05-01 DIAGNOSIS — Z23 Encounter for immunization: Secondary | ICD-10-CM

## 2013-05-23 ENCOUNTER — Other Ambulatory Visit: Payer: Self-pay | Admitting: Internal Medicine

## 2013-07-17 ENCOUNTER — Other Ambulatory Visit: Payer: Self-pay | Admitting: Family Medicine

## 2013-07-17 DIAGNOSIS — M949 Disorder of cartilage, unspecified: Secondary | ICD-10-CM

## 2013-07-17 DIAGNOSIS — M899 Disorder of bone, unspecified: Secondary | ICD-10-CM

## 2013-07-17 DIAGNOSIS — E039 Hypothyroidism, unspecified: Secondary | ICD-10-CM

## 2013-07-17 DIAGNOSIS — R739 Hyperglycemia, unspecified: Secondary | ICD-10-CM

## 2013-07-17 DIAGNOSIS — E785 Hyperlipidemia, unspecified: Secondary | ICD-10-CM

## 2013-07-21 ENCOUNTER — Other Ambulatory Visit (INDEPENDENT_AMBULATORY_CARE_PROVIDER_SITE_OTHER): Payer: BC Managed Care – PPO

## 2013-07-21 DIAGNOSIS — R739 Hyperglycemia, unspecified: Secondary | ICD-10-CM

## 2013-07-21 DIAGNOSIS — R7309 Other abnormal glucose: Secondary | ICD-10-CM

## 2013-07-21 DIAGNOSIS — E039 Hypothyroidism, unspecified: Secondary | ICD-10-CM

## 2013-07-21 DIAGNOSIS — E785 Hyperlipidemia, unspecified: Secondary | ICD-10-CM

## 2013-07-21 DIAGNOSIS — M899 Disorder of bone, unspecified: Secondary | ICD-10-CM

## 2013-07-21 DIAGNOSIS — M949 Disorder of cartilage, unspecified: Principal | ICD-10-CM

## 2013-07-21 LAB — COMPREHENSIVE METABOLIC PANEL
ALBUMIN: 3.8 g/dL (ref 3.5–5.2)
ALT: 17 U/L (ref 0–35)
AST: 18 U/L (ref 0–37)
Alkaline Phosphatase: 68 U/L (ref 39–117)
BUN: 18 mg/dL (ref 6–23)
CALCIUM: 9.1 mg/dL (ref 8.4–10.5)
CHLORIDE: 104 meq/L (ref 96–112)
CO2: 30 mEq/L (ref 19–32)
Creatinine, Ser: 0.6 mg/dL (ref 0.4–1.2)
GFR: 100.19 mL/min (ref >60.00)
Glucose, Bld: 110 mg/dL — ABNORMAL HIGH (ref 70–99)
POTASSIUM: 4.5 meq/L (ref 3.5–5.1)
SODIUM: 140 meq/L (ref 135–145)
Total Bilirubin: 1 mg/dL (ref 0.3–1.2)
Total Protein: 6.7 g/dL (ref 6.0–8.3)

## 2013-07-21 LAB — TSH: TSH: 0.2 u[IU]/mL — ABNORMAL LOW (ref 0.35–5.50)

## 2013-07-21 LAB — LIPID PANEL
Cholesterol: 135 mg/dL (ref 0–200)
HDL: 39.3 mg/dL (ref >39.00)
LDL Cholesterol: 62 mg/dL (ref 0–99)
Total CHOL/HDL Ratio: 3
Triglycerides: 171 mg/dL — ABNORMAL HIGH (ref 0.0–149.0)
VLDL: 34.2 mg/dL (ref 0.0–40.0)

## 2013-07-21 LAB — HEMOGLOBIN A1C: HEMOGLOBIN A1C: 5.2 % (ref 4.6–6.5)

## 2013-07-22 LAB — VITAMIN D 25 HYDROXY (VIT D DEFICIENCY, FRACTURES): VIT D 25 HYDROXY: 35 ng/mL (ref 30–89)

## 2013-07-28 ENCOUNTER — Encounter: Payer: Self-pay | Admitting: Family Medicine

## 2013-07-28 ENCOUNTER — Ambulatory Visit (INDEPENDENT_AMBULATORY_CARE_PROVIDER_SITE_OTHER): Payer: BC Managed Care – PPO | Admitting: Family Medicine

## 2013-07-28 VITALS — BP 118/74 | HR 60 | Temp 97.7°F | Ht 64.75 in | Wt 222.5 lb

## 2013-07-28 DIAGNOSIS — G43909 Migraine, unspecified, not intractable, without status migrainosus: Secondary | ICD-10-CM

## 2013-07-28 DIAGNOSIS — M79609 Pain in unspecified limb: Secondary | ICD-10-CM

## 2013-07-28 DIAGNOSIS — Z0000 Encounter for general adult medical examination without abnormal findings: Secondary | ICD-10-CM

## 2013-07-28 DIAGNOSIS — E039 Hypothyroidism, unspecified: Secondary | ICD-10-CM

## 2013-07-28 DIAGNOSIS — M79643 Pain in unspecified hand: Secondary | ICD-10-CM

## 2013-07-28 DIAGNOSIS — E785 Hyperlipidemia, unspecified: Secondary | ICD-10-CM

## 2013-07-28 DIAGNOSIS — I10 Essential (primary) hypertension: Secondary | ICD-10-CM

## 2013-07-28 MED ORDER — HYDROCODONE-ACETAMINOPHEN 5-325 MG PO TABS: 1.0000 | ORAL_TABLET | Freq: Four times a day (QID) | ORAL | Status: DC | PRN

## 2013-07-28 MED ORDER — PROMETHAZINE HCL 25 MG PO TABS: 25.0000 mg | ORAL_TABLET | Freq: Four times a day (QID) | ORAL | Status: DC | PRN

## 2013-07-28 MED ORDER — LEVOTHYROXINE SODIUM 150 MCG PO TABS: 150.0000 ug | ORAL_TABLET | Freq: Every day | ORAL | Status: DC

## 2013-07-28 NOTE — Patient Instructions (Addendum)
Call about your mammogram.  Try to work on your diet and get a little more exercise.  Check your mouse placement at work and use an OTC wrist brace. See if that helps.   Change to 18mcg of thyroid medicine a day and recheck TSH in about 8 weeks.  Take care.  Glad to see you.

## 2013-07-28 NOTE — Progress Notes (Signed)
Pre-visit discussion using our clinic review tool. No additional management support is needed unless otherwise documented below in the visit note.  CPE- See plan.  Routine anticipatory guidance given to patient.  See health maintenance. Vaccines up to date.  Pap not indicated.   Colonoscopy 2013 Mammogram. She'll call about scheduling.  DXA done 2013 Living will d/w pt.  Would have her husband designated if incapacitated.    Elevated Cholesterol: Using medications without problems: yes Muscle aches: no Diet compliance: encouraged Exercise: encouraged  Hypertension:    Using medication without problems: yes Chest pain with exertion:no Edema:no Short of breath: likely due to deconditioning.   She has a migraine hx and had rarely used lortab/phenergan.  She needed a refill.    Hypothyroidism.  TSH low. D/w pt.  No neck mass. No neck pain.   R thenar and wrist pain, with throwing motion.   She has had some fatigue and abnormal dreams. No recent med changes.    PMH and SH reviewed  Meds, vitals, and allergies reviewed.   ROS: See HPI.  Otherwise negative.    GEN: nad, alert and oriented HEENT: mucous membranes moist NECK: supple w/o LA, no tmg CV: rrr. PULM: ctab, no inc wob ABD: soft, +bs EXT: no edema SKIN: no acute rash ttp near the R thenar area, not puffy, no ROM deficit.  Pain with resisted wrist flexion.  Distally nv intact.  No weakness.

## 2013-07-29 ENCOUNTER — Telehealth: Payer: Self-pay | Admitting: Family Medicine

## 2013-07-29 DIAGNOSIS — M79643 Pain in unspecified hand: Secondary | ICD-10-CM | POA: Insufficient documentation

## 2013-07-29 NOTE — Assessment & Plan Note (Signed)
Okay for prn (rare) opiate use along with phenergan, f/u prn.  She agrees.

## 2013-07-29 NOTE — Assessment & Plan Note (Signed)
Dec replacement given low tsh, d/w pt.  Recheck in about 8 weeks.  She agrees.

## 2013-07-29 NOTE — Assessment & Plan Note (Signed)
Likely overuse, would check her workstation and use an otc brace. She agrees.  No need to image today.

## 2013-07-29 NOTE — Assessment & Plan Note (Signed)
Controlled, continue current meds. Labs, diet, exercise d/w pt.

## 2013-07-29 NOTE — Assessment & Plan Note (Signed)
Routine anticipatory guidance given to patient.  See health maintenance. Vaccines up to date.  Pap not indicated.   Colonoscopy 2013 Mammogram. She'll call about scheduling.  DXA done 2013 Living will d/w pt.  Would have her husband designated if incapacitated.

## 2013-07-29 NOTE — Assessment & Plan Note (Signed)
Reasonable control, continue current meds. Labs, diet, exercise d/w pt.

## 2013-07-29 NOTE — Telephone Encounter (Signed)
Relevant patient education assigned to patient using Emmi. ° °

## 2013-09-15 ENCOUNTER — Ambulatory Visit (INDEPENDENT_AMBULATORY_CARE_PROVIDER_SITE_OTHER): Payer: BC Managed Care – PPO | Admitting: Cardiovascular Disease

## 2013-09-15 ENCOUNTER — Encounter: Payer: Self-pay | Admitting: Cardiovascular Disease

## 2013-09-15 VITALS — BP 100/68 | HR 63 | Ht 65.0 in | Wt 223.8 lb

## 2013-09-15 DIAGNOSIS — E785 Hyperlipidemia, unspecified: Secondary | ICD-10-CM

## 2013-09-15 DIAGNOSIS — I4891 Unspecified atrial fibrillation: Secondary | ICD-10-CM

## 2013-09-15 DIAGNOSIS — R079 Chest pain, unspecified: Secondary | ICD-10-CM

## 2013-09-15 DIAGNOSIS — E669 Obesity, unspecified: Secondary | ICD-10-CM

## 2013-09-15 DIAGNOSIS — I10 Essential (primary) hypertension: Secondary | ICD-10-CM

## 2013-09-15 MED ORDER — ROSUVASTATIN CALCIUM 10 MG PO TABS: 10.0000 mg | ORAL_TABLET | Freq: Every day | ORAL | Status: DC

## 2013-09-15 NOTE — Progress Notes (Signed)
Patient ID: Kara Wright, female    DOB: 05-18-1946, 68 y.o.   MRN: [id number]  HPI Comments: Ms. Mallicoat is a very pleasant 68 year old woman with obesity, hypothyroidism, remote smoking history for less than 10 years, prior TIA.  History of atrial fibrillation seen on Holter monitor   08/25/2011,  right eye vision changes, right arm weakness and numbness lasting for 30-40 minutes. Symptoms seemed to resolve. The next day, on Saturday, she had some mouth and facial numbness and she went to the emergency room. She also had headache. They did a EKG and CT scan of the head that showed no stroke. Early in the week, she continued to have headache and mouth issues and she was taken to [facility]. She had MRA that showed small vessel disease, carotid ultrasound that showed mild plaque. She reports that she was in the hospital for 3 days. No atrial fibrillation was seen.   Echocardiogram was essentially normal. Prior episodes of tachycardia lasting less than 1 minute on her last clinic visit   In followup today, she reports that she is doing well. She does have some discomfort in her left upper chest region radiating to her shoulder over the past month. Symptoms seem to last for several minutes at a time, possibly worse with palpation. Symptoms do not get worse with exertion . She does report recent stressors Tolerating Crestor 5 mg well with recent total cholesterol 135 Sleeping well, not doing regular exercise  Previous Holter monitor that showed short periods of supraventricular tachycardia, rare APCs, long stretches of atrial fibrillation. She did not have any symptoms while wearing the monitor  EKG today shows normal sinus rhythm with rate 63 beats per minute with no significant ST or T wave changes    Outpatient Encounter Prescriptions as of 09/15/2013  Medication Sig  . aspirin EC 81 MG tablet Take 81 mg by mouth 2 (two) times daily.   . cholecalciferol (VITAMIN D) 1000 UNITS tablet Take  by mouth. Take two tablets daily  . citalopram (CELEXA) 20 MG tablet Take 1 tablet (20 mg total) by mouth daily.  . clobetasol cream (TEMOVATE) 2.67 % Apply 1 application topically 2 (two) times daily as needed. For rash  . diltiazem (CARDIZEM) 30 MG tablet Take 1 tablet (30 mg total) by mouth 4 (four) times daily as needed.  Marland Kitchen HYDROcodone-acetaminophen (NORCO/VICODIN) 5-325 MG per tablet Take 1 tablet by mouth every 6 (six) hours as needed (for migraine).  Marland Kitchen levothyroxine (SYNTHROID, LEVOTHROID) 150 MCG tablet Take 1 tablet (150 mcg total) by mouth daily.  . Multiple Vitamins-Minerals (CENTRUM SILVER ULTRA WOMENS PO) Take by mouth daily.   Marland Kitchen omeprazole (PRILOSEC) 20 MG capsule TAKE 1 CAPSULE DAILY  . promethazine (PHENERGAN) 25 MG tablet Take 1 tablet (25 mg total) by mouth every 6 (six) hours as needed. For nausea  . propranolol (INDERAL) 20 MG tablet Take 20 mg by mouth as needed.  . propranolol ER (INDERAL LA) 120 MG 24 hr capsule Take 1 capsule (120 mg total) by mouth daily.  . rosuvastatin (CRESTOR) 10 MG tablet Take 5 mg by mouth daily.  . vitamin B-12 (CYANOCOBALAMIN) 500 MCG tablet Take 500 mcg by mouth daily.    Review of Systems  Constitutional: Negative.   HENT: Negative.   Eyes: Negative.   Respiratory: Positive for chest tightness.   Gastrointestinal: Negative.   Endocrine: Negative.   Musculoskeletal: Negative.   Skin: Negative.   Allergic/Immunologic: Negative.   Neurological: Negative.   Hematological:  Negative.   Psychiatric/Behavioral: Positive for sleep disturbance. The patient is nervous/anxious.   All other systems reviewed and are negative.    BP 100/68  Pulse 63  Ht 5\' 5"  (1.651 m)  Wt 223 lb 12 oz (101.492 kg)  BMI 37.23 kg/m2  Physical Exam  Nursing note and vitals reviewed. Constitutional: She is oriented to person, place, and time. She appears well-developed and well-nourished.  HENT:  Head: Normocephalic.  Nose: Nose normal.  Mouth/Throat:  Oropharynx is clear and moist.  Eyes: Conjunctivae are normal. Pupils are equal, round, and reactive to light.  Neck: Normal range of motion. Neck supple. No JVD present.  Cardiovascular: Normal rate, regular rhythm, S1 normal, S2 normal, normal heart sounds and intact distal pulses.  Exam reveals no gallop and no friction rub.   No murmur heard. Pulmonary/Chest: Effort normal and breath sounds normal. No respiratory distress. She has no wheezes. She has no rales. She exhibits no tenderness.  Abdominal: Soft. Bowel sounds are normal. She exhibits no distension. There is no tenderness.  Musculoskeletal: Normal range of motion. She exhibits no edema and no tenderness.  Lymphadenopathy:    She has no cervical adenopathy.  Neurological: She is alert and oriented to person, place, and time. Coordination normal.  Skin: Skin is warm and dry. No rash noted. No erythema.  Psychiatric: She has a normal mood and affect. Her behavior is normal. Judgment and thought content normal.    Assessment and Plan

## 2013-09-15 NOTE — Assessment & Plan Note (Signed)
No recent episodes of atrial fibrillation. We'll continue current dose of propranolol

## 2013-09-15 NOTE — Assessment & Plan Note (Signed)
Cholesterol is at goal on the current lipid regimen. No changes to the medications were made.  

## 2013-09-15 NOTE — Patient Instructions (Signed)
You are doing well. No medication changes were made.  Please call us if you have new issues that need to be addressed before your next appt.  Your physician wants you to follow-up in: 12 months.  You will receive a reminder letter in the mail two months in advance. If you don't receive a letter, please call our office to schedule the follow-up appointment. 

## 2013-09-15 NOTE — Assessment & Plan Note (Signed)
We have encouraged continued exercise, careful diet management in an effort to lose weight. 

## 2013-09-15 NOTE — Assessment & Plan Note (Signed)
Blood pressure is well controlled on today's visit. No changes made to the medications. 

## 2013-09-22 ENCOUNTER — Other Ambulatory Visit (INDEPENDENT_AMBULATORY_CARE_PROVIDER_SITE_OTHER): Payer: BC Managed Care – PPO

## 2013-09-22 ENCOUNTER — Encounter: Payer: Self-pay | Admitting: *Deleted

## 2013-09-22 DIAGNOSIS — E039 Hypothyroidism, unspecified: Secondary | ICD-10-CM

## 2013-09-22 LAB — TSH: TSH: 0.77 u[IU]/mL (ref 0.35–5.50)

## 2013-09-30 ENCOUNTER — Other Ambulatory Visit: Payer: Self-pay | Admitting: Cardiovascular Disease

## 2013-10-09 ENCOUNTER — Ambulatory Visit: Payer: Self-pay | Admitting: Family Medicine

## 2013-10-09 ENCOUNTER — Encounter: Payer: Self-pay | Admitting: Family Medicine

## 2013-10-17 ENCOUNTER — Other Ambulatory Visit: Payer: Self-pay | Admitting: Family Medicine

## 2013-12-07 ENCOUNTER — Other Ambulatory Visit: Payer: Self-pay | Admitting: Internal Medicine

## 2013-12-20 ENCOUNTER — Other Ambulatory Visit: Payer: Self-pay | Admitting: Family Medicine

## 2014-02-27 ENCOUNTER — Telehealth: Payer: Self-pay | Admitting: Internal Medicine

## 2014-02-27 MED ORDER — OMEPRAZOLE 20 MG PO CPDR: 20.0000 mg | DELAYED_RELEASE_CAPSULE | Freq: Every day | ORAL | Status: DC

## 2014-02-27 NOTE — Telephone Encounter (Signed)
Refilled Omprazole

## 2014-04-03 ENCOUNTER — Other Ambulatory Visit: Payer: Self-pay

## 2014-04-13 ENCOUNTER — Telehealth: Payer: Self-pay | Admitting: Internal Medicine

## 2014-04-13 ENCOUNTER — Encounter: Payer: Self-pay | Admitting: Internal Medicine

## 2014-04-13 ENCOUNTER — Ambulatory Visit (INDEPENDENT_AMBULATORY_CARE_PROVIDER_SITE_OTHER): Payer: BC Managed Care – PPO | Admitting: Internal Medicine

## 2014-04-13 VITALS — BP 102/70 | HR 76 | Ht 65.0 in | Wt 231.0 lb

## 2014-04-13 DIAGNOSIS — K219 Gastro-esophageal reflux disease without esophagitis: Secondary | ICD-10-CM

## 2014-04-13 DIAGNOSIS — K644 Residual hemorrhoidal skin tags: Secondary | ICD-10-CM

## 2014-04-13 DIAGNOSIS — R159 Full incontinence of feces: Secondary | ICD-10-CM

## 2014-04-13 DIAGNOSIS — K648 Other hemorrhoids: Secondary | ICD-10-CM

## 2014-04-13 MED ORDER — HYDROCORTISONE ACETATE 25 MG RE SUPP: 25.0000 mg | Freq: Every day | RECTAL | Status: DC

## 2014-04-13 MED ORDER — MOVIPREP 100 G PO SOLR: 1.0000 | Freq: Once | ORAL | Status: DC

## 2014-04-13 MED ORDER — OMEPRAZOLE 20 MG PO CPDR: 20.0000 mg | DELAYED_RELEASE_CAPSULE | Freq: Every day | ORAL | Status: DC

## 2014-04-13 NOTE — Progress Notes (Signed)
HISTORY OF PRESENT ILLNESS:  Kara Wright is a 68 y.o. female with multiple medical problems who is followed in this office for GERD and colon cancer screening (high risks-father age 8). She presents today for ongoing management of her chronic GERD. She requests medication refill. She also has issues with fecal incontinence. She has not been seen in the office for 2 years. She reports good control of GERD with omeprazole. No dysphagia. She reports a one-year history of minor fecal incontinence. Difficulty cleaning post defecation. Prior sphincterotomy for anal fissure. No pain. She does describe itching. Also possible hemorrhoid. She has about 2-3 bowel movements each morning. Somewhat on the loose side. Last complete colonoscopy October 2013 revealed mild sigmoid diverticulosis but was otherwise normal. Last upper endoscopy at that same time revealed distal esophagitis  REVIEW OF SYSTEMS:  All non-GI ROS negative except for allergies, back pain, fatigue, itching, night sweats, shortness of breath, skin rash, irregular heartbeat  Past Medical History  Diagnosis Date  . Diverticulosis 03/1997  . GERD (gastroesophageal reflux disease) 1997    good on prilosec  . Hyperlipidemia 08/1996  . Hypothyroidism 1984  . Osteopenia 11/2002    via dexa  . Atrial fib/flutter, transient     1 episode, resolved, never on coumadin  . Migraine     w/o aura  . Dysrhythmia     atrial fibrilation  . Hepatitis     hx of hep B  . Stroke 08/2011    TIA  . Cancer     skin cancer    Past Surgical History  Procedure Laterality Date  . Polypectomy  04/06/97    colon, benign by pathology  . Partial hysterectomy  1978-79  . Cholecystectomy  07/2006  . 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 Nasolabial fold skin flap surgery  08/02/2007    [facility]  . Varicose vein ablation sclerotherapy  09/2008    Dr. Hulda Humphrey  . Abdominal hysterectomy      Social History Kara Wright   reports that she quit smoking about 27 years ago. She has never used smokeless tobacco. She reports that she does not drink alcohol or use illicit drugs.  family history includes Allergies in her sister; Colon cancer (age of onset: 45) in her father; Hypertension in her mother; Osteopenia in her mother; Stroke in her mother. There is no history of Breast cancer.  Allergies  Allergen Reactions  . Celecoxib     REACTION: nausea: GI UPSET  . Nickel     Skin rash, allergy  . Simvastatin     Aches but tolerates crestor       PHYSICAL EXAMINATION: Vital signs: BP 102/70  Pulse 76  Ht 5\' 5"  (1.651 m)  Wt 231 lb (104.781 kg)  BMI 38.44 kg/m2  Constitutional: Obese, generally well-appearing, no acute distress Psychiatric: alert and oriented x3, cooperative Eyes: extraocular movements intact, anicteric, conjunctiva pink Mouth: oral pharynx moist, no lesions Neck: supple no lymphadenopathy Cardiovascular: heart regular rate and rhythm, no murmur Lungs: clear to auscultation bilaterally Abdomen: soft, obese, nontender, nondistended, no obvious ascites, no peritoneal signs, normal bowel sounds, no organomegaly Rectal: Sensation is intact. Poor sphincter tone. Noninflamed external hemorrhoid of moderate size. No internal mass or tenderness. Brown Hemoccult negative stool. Extremities: no lower extremity edema bilaterally Skin: no lesions on visible extremities Neuro: No focal deficits.   ASSESSMENT:  #1. Chronic GERD. Symptoms well controlled on PPI #2. Fecal incontinence  secondary to poor sphincter tone #3. External hemorrhoids #4. Family history of colon cancer in first-degree relative age 36. Screening up-to-date   PLAN:  #1. Reflux precautions with attention to weight loss. Reviewed #2. Continue PPI. Prescription refilled #3. Recommend changing stool consistency by adding Metamucil 1-2 tablespoons daily #4. Anusol suppositories to assist with hemorrhoid as well as itching #5.  Consider bathing in the morning after defecation completed #6. Follow-up screening colonoscopy around October 2018. #7. Routine GI follow-up 2 years. Sooner if needed

## 2014-04-13 NOTE — Telephone Encounter (Signed)
Sent Anusol suppositories to [facility]

## 2014-04-13 NOTE — Patient Instructions (Signed)
  We have sent the following medications to your pharmacy for you to pick up at your convenience: Anusol HC Suppositories, Omeprazole.  As discussed with Dr. Henrene Pastor, take 1-2 tablespoons of Metamucil daily.  It might be helpful to bathe after having a bowel movement

## 2014-04-24 ENCOUNTER — Encounter: Payer: Self-pay | Admitting: Family Medicine

## 2014-04-24 ENCOUNTER — Telehealth: Payer: Self-pay | Admitting: Family Medicine

## 2014-04-24 ENCOUNTER — Ambulatory Visit (INDEPENDENT_AMBULATORY_CARE_PROVIDER_SITE_OTHER): Payer: BC Managed Care – PPO | Admitting: Family Medicine

## 2014-04-24 VITALS — BP 130/70 | HR 61 | Wt 233.0 lb

## 2014-04-24 DIAGNOSIS — R300 Dysuria: Secondary | ICD-10-CM

## 2014-04-24 DIAGNOSIS — N390 Urinary tract infection, site not specified: Secondary | ICD-10-CM

## 2014-04-24 DIAGNOSIS — R310 Gross hematuria: Secondary | ICD-10-CM

## 2014-04-24 LAB — POCT URINALYSIS DIPSTICK
Bilirubin, UA: NEGATIVE
GLUCOSE UA: NEGATIVE
KETONES UA: NEGATIVE
Nitrite, UA: NEGATIVE
SPEC GRAV UA: 1.02
Urobilinogen, UA: 0.2
pH, UA: 7

## 2014-04-24 LAB — POCT UA - MICROSCOPIC ONLY

## 2014-04-24 MED ORDER — CIPROFLOXACIN HCL 250 MG PO TABS: 250.0000 mg | ORAL_TABLET | Freq: Two times a day (BID) | ORAL | Status: DC

## 2014-04-24 NOTE — Assessment & Plan Note (Addendum)
Simple uncomplicated, contraindication with allergy to celebrex /sulfa, so will use cipro.Treat with antibitoics. Given hematuria will recommend returning in 2 weeks to make sure blood resolved in urine.  Send for culture.

## 2014-04-24 NOTE — Progress Notes (Signed)
Pre visit review using our clinic review tool, if applicable. No additional management support is needed unless otherwise documented below in the visit note. 

## 2014-04-24 NOTE — Progress Notes (Signed)
   Subjective:    Patient ID: Kara Wright, female    DOB: 1945/09/08, 68 y.o.   MRN: [id number]  Urinary Tract Infection  This is a new problem. The current episode started in the past 7 days (2 days). The problem occurs every urination. The problem has been gradually worsening. The quality of the pain is described as aching. The pain is mild. There has been no fever. She is not sexually active. There is no history of pyelonephritis. Associated symptoms include frequency and urgency. Pertinent negatives include no chills, flank pain, hesitancy, nausea or vomiting. Associated symptoms comments: Urinary pressure  . She has tried increased fluids for the symptoms. The treatment provided no relief. There is no history of catheterization, kidney stones, recurrent UTIs, a single kidney, urinary stasis or a urological procedure.      Review of Systems  Constitutional: Negative for chills.  Gastrointestinal: Negative for nausea and vomiting.  Genitourinary: Positive for urgency and frequency. Negative for hesitancy and flank pain.       Objective:   Physical Exam  Constitutional: Vital signs are normal. She appears well-developed and well-nourished. She is cooperative.  Non-toxic appearance. She does not appear ill. No distress.  HENT:  Head: Normocephalic.  Right Ear: Hearing, tympanic membrane, external ear and ear canal normal. Tympanic membrane is not erythematous, not retracted and not bulging.  Left Ear: Hearing, tympanic membrane, external ear and ear canal normal. Tympanic membrane is not erythematous, not retracted and not bulging.  Nose: No mucosal edema or rhinorrhea. Right sinus exhibits no maxillary sinus tenderness and no frontal sinus tenderness. Left sinus exhibits no maxillary sinus tenderness and no frontal sinus tenderness.  Mouth/Throat: Uvula is midline, oropharynx is clear and moist and mucous membranes are normal.  Eyes: Conjunctivae, EOM and lids are normal. Pupils are  equal, round, and reactive to light. Lids are everted and swept, no foreign bodies found.  Neck: Trachea normal and normal range of motion. Neck supple. Carotid bruit is not present. No thyroid mass and no thyromegaly present.  Cardiovascular: Normal rate, regular rhythm, S1 normal, S2 normal, normal heart sounds, intact distal pulses and normal pulses.  Exam reveals no gallop and no friction rub.   No murmur heard. Pulmonary/Chest: Effort normal and breath sounds normal. No tachypnea. No respiratory distress. She has no decreased breath sounds. She has no wheezes. She has no rhonchi. She has no rales.  Abdominal: Soft. Normal appearance and bowel sounds are normal. There is generalized tenderness. There is no CVA tenderness.  Neurological: She is alert.  Skin: Skin is warm, dry and intact. No rash noted.  Psychiatric: Her speech is normal and behavior is normal. Judgment and thought content normal. Her mood appears not anxious. Cognition and memory are normal. She does not exhibit a depressed mood.          Assessment & Plan:

## 2014-04-24 NOTE — Addendum Note (Signed)
Addended byEliezer Lofts E on: 04/24/2014 04:55 PM   Modules accepted: SmartSet

## 2014-04-24 NOTE — Patient Instructions (Signed)
Push fluids. Complete antibiotics. We will call with culture results. Given hematuria ( blood in urine) will recommend returning in 2 weeks for nurse visit to make sure blood resolved in urine.

## 2014-04-24 NOTE — Telephone Encounter (Signed)
Patient Information:  Caller Name: Kara Wright  Phone: [phone]  Patient: Kara Wright  Gender: Female  DOB: 1945-12-23  Age: 68 Years  PCP: Elsie Stain Brigitte Pulse) [facility])  Office Follow Up:  Does the office need to follow up with this patient?: No  Instructions For The Office: N/A  RN Note: Appnt cancelled for 04/27/14. Scheduled to come in today at 1600.   Symptoms  Reason For Call & Symptoms: Calling about frequent urination with pressure and urgency onset 04/22/14 and is having blood with wiping today. She is having accidents. Drinking increased fluids and some cranberry juice today. Afebrile, but she does not feel good like she if fighting infection. Back was hurting about a week ago for a few days but no back pain today.  Reviewed Health History In EMR: Yes  Reviewed Medications In EMR: Yes  Reviewed Allergies In EMR: Yes  Reviewed Surgeries / Procedures: Yes  Date of Onset of Symptoms: 04/22/2014  Guideline(s) Used:  Urination Pain - Female  Disposition Per Guideline:   See Today in Office  Reason For Disposition Reached:   Age > 50 years  Advice Given:  Fluids:   Drink extra fluids. Drink 8-10 glasses of liquids a day (Reason: to produce a dilute, non-irritating urine).  Cranberry Juice:   Some people think that drinking cranberry juice may help in fighting urinary tract infections. However, there is no good research that has ever proved this.  Dosage Cranberry Juice Cocktail: 8 oz (240 ml) twice a day.  Dosage 100% Cranberry Juice: 1 oz (30 ml) twice a day.  Caution: Do not drink more than 16 oz (480 ml). Here is the reason: too much cranberry juice can also be irritating to the bladder.  Warm Saline SITZ Baths to Reduce Pain:  Sit in a warm saline bath for 20 minutes to cleanse the area and to reduce pain. Add 2 oz. of table salt or baking soda to a tub of water.  Call Back If:  You become worse.  Patient Will Follow Care Advice:  YES  Appointment  Scheduled:  04/24/2014 16:00:00 Appointment Scheduled Provider:  Eliezer Lofts [facility])

## 2014-04-24 NOTE — Assessment & Plan Note (Signed)
Likely due to infection. Assure resolution with UA in 2 weeks.

## 2014-04-27 ENCOUNTER — Ambulatory Visit: Payer: BC Managed Care – PPO | Admitting: Family Medicine

## 2014-04-27 LAB — URINE CULTURE

## 2014-04-30 ENCOUNTER — Ambulatory Visit (INDEPENDENT_AMBULATORY_CARE_PROVIDER_SITE_OTHER): Payer: BC Managed Care – PPO

## 2014-04-30 DIAGNOSIS — Z23 Encounter for immunization: Secondary | ICD-10-CM

## 2014-05-07 ENCOUNTER — Other Ambulatory Visit (INDEPENDENT_AMBULATORY_CARE_PROVIDER_SITE_OTHER): Payer: BC Managed Care – PPO

## 2014-05-07 DIAGNOSIS — R319 Hematuria, unspecified: Secondary | ICD-10-CM

## 2014-05-07 LAB — POCT URINALYSIS DIPSTICK
BILIRUBIN UA: NEGATIVE
Blood, UA: NEGATIVE
Glucose, UA: NEGATIVE
KETONES UA: NEGATIVE
NITRITE UA: NEGATIVE
PH UA: 6
Spec Grav, UA: >=1.03
Urobilinogen, UA: 0.2

## 2014-06-01 ENCOUNTER — Other Ambulatory Visit: Payer: Self-pay | Admitting: Internal Medicine

## 2014-06-30 ENCOUNTER — Telehealth: Payer: Self-pay | Admitting: Family Medicine

## 2014-06-30 NOTE — Telephone Encounter (Signed)
Noted, thanks!

## 2014-06-30 NOTE — Telephone Encounter (Signed)
PLEASE NOTE: All timestamps contained within this report are represented as [place] Standard Time. CONFIDENTIALTY NOTICE: This fax transmission is intended only for the addressee. It contains information that is legally privileged, confidential or otherwise protected from use or disclosure. If you are not the intended recipient, you are strictly prohibited from reviewing, disclosing, copying using or disseminating any of this information or taking any action in reliance on or regarding this information. If you have received this fax in error, please notify us immediately by telephone so that we can arrange for its return to [place]. Phone: [phone], Toll-Free: [phone], Fax: [phone] Page: 1 of 2 Call Id: [id number] [place] Patient Name: Kara Wright Gender: Female DOB: 10/14/1945 Age: 69 Y 11 M 1 D Return Phone Number: [phone] (Primary) Address: [address] City/State/Zip: [place] [address] Client [facility] - Client Client Site [facility] - Day Contact Type Call Call Type Triage / Clinical Relationship To Patient Self Appointment Disposition EMR Appointment Attempted - Not Scheduled Return Phone Number [phone] (Primary) Chief Complaint Cough Initial Comment Caller states was Dx/ w Laryngitis, has a cough, is sore from coughing. PreDisposition Call Doctor Nurse Assessment Nurse: Marcelline Deist, RN, [name] Date/Time [name] Time): 06/30/2014 10:35:12 AM Confirm and document reason for call. If symptomatic, describe symptoms. ---Caller states she has laryngitis, lost her voice Friday & Sat. The cough started Sunday & is sore from coughing. Taking Mucinex. Coughing up thick, tan mucus. Ran a low grade fever Sat. & Sunday. Head hurts from coughing hard & chest was tight this morning. Has the patient traveled out of the country  within the last 30 days? ---Not Applicable Does the patient require triage? ---Yes Related visit to physician within the last 2 weeks? ---No Does the PT have any chronic conditions? (i.e. diabetes, asthma, etc.) ---Yes List chronic conditions. ---A fib, TIA hx. Guidelines Guideline Title Affirmed Question Affirmed Notes Nurse Date/Time [name] Time) Cough - Acute Productive Wheezing is present Marcelline Deist, Therapist, sports, [name] 06/30/2014 10:38:53 AM Disp. Time [name] Time) Disposition Final User 06/30/2014 10:47:36 AM See Physician within 4 Hours (or PCP triage) Yes Marcelline Deist, RN, Milana Kidney Understands: Yes Disagree/Comply: Comply PLEASE NOTE: All timestamps contained within this report are represented as [place] Standard Time. CONFIDENTIALTY NOTICE: This fax transmission is intended only for the addressee. It contains information that is legally privileged, confidential or otherwise protected from use or disclosure. If you are not the intended recipient, you are strictly prohibited from reviewing, disclosing, copying using or disseminating any of this information or taking any action in reliance on or regarding this information. If you have received this fax in error, please notify us immediately by telephone so that we can arrange for its return to [place]. Phone: [phone], Toll-Free: [phone], Fax: [phone] Page: 2 of 2 Call Id: [id number] Care Advice Given Per Guideline SEE PHYSICIAN WITHIN 4 HOURS (or PCP triage): * IF NO PCP TRIAGE: You need to be seen. Go to _______________ (ED/UCC or office if it will be open) within the next 3 or 4 hours. Go sooner if you become worse. COUGH MEDICINES: CARE ADVICE given per Cough - Acute Productive (Adult) guideline. CALL BACK IF: * You become worse. - HOME REMEDY - HONEY: This old home remedy has been shown to help decrease coughing at night. The adult dosage is 2 teaspoons (10 ml) at bedtime. Honey should not be given to infants  under one year of  age. OTC COUGH SYRUP - DEXTROMETHORPHAN: * Cough syrups containing the cough suppressant dextromethorphan (DM) may help decrease your cough. Cough syrups work best for coughs that keep you awake at night. They can also sometimes help in the late stages of a respiratory infection when the cough is dry and hacking. They can be used along with cough drops. After Care Instructions Given Call Event Type User Date / Time Description Comments User: Donald Siva, RN Date/Time [name] Time): 06/30/2014 10:55:36 AM Caller advised to be seen at an UC or ER since nurse is unable to find available appt. within 4 hours. Caller states [facility] has no openings today either. Referrals GO TO FACILITY UNDECIDED GO TO FACILITY UNDECIDED

## 2014-06-30 NOTE — Telephone Encounter (Signed)
Patient Name: Kara Wright  DOB: 1946-01-07    Nurse Assessment  Nurse: Marcelline Deist, RN, Lynda Date/Time (Eastern Time): 06/30/2014 10:35:12 AM  Confirm and document reason for call. If symptomatic, describe symptoms. ---Caller states she has laryngitis, lost her voice Friday & Sat. The cough started Sunday & is sore from coughing. Taking Mucinex. Coughing up thick, tan mucus. Ran a low grade fever Sat. & Sunday. Head hurts from coughing hard & chest was tight this morning.  Has the patient traveled out of the country within the last 30 days? ---Not Applicable  Does the patient require triage? ---Yes  Related visit to physician within the last 2 weeks? ---No  Does the PT have any chronic conditions? (i.e. diabetes, asthma, etc.) ---Yes  List chronic conditions. ---A fib, TIA hx.     Guidelines    Guideline Title Affirmed Question Affirmed Notes  Cough - Acute Productive Wheezing is present    Final Disposition User   See Physician within 4 Hours (or PCP triage) Marcelline Deist, RN, [name]

## 2014-06-30 NOTE — Telephone Encounter (Signed)
Agree, UC if we have no slots today.  Thanks.

## 2014-06-30 NOTE — Telephone Encounter (Signed)
Patient went to UC and was told she has bronchitis and was given Minocycline and Mucinex.

## 2014-07-17 ENCOUNTER — Telehealth: Payer: Self-pay | Admitting: Family Medicine

## 2014-07-17 NOTE — Telephone Encounter (Signed)
The congestion can persist so that may not be atypical.  Mucinex can make a person lightheaded, especially if not drinking enough fluids.  She could skip the next dose or two of mucinex and continue with liberal fluids.  That may help (and she can see if the mucinex has been still helping any).  Gargle with salt water, can use Anbesol or similar for the fever blisters.  Those should resolve.  If SOB or worsening overall, then needs to be rechecked.  If not better by early next week, then schedule here.  Hope she feels better soon.  Thanks.

## 2014-07-17 NOTE — Telephone Encounter (Signed)
Patient advised.

## 2014-07-17 NOTE — Telephone Encounter (Signed)
Pt called in with an update. Pt is still really congestion, and multiple fever blisters. Pt is still taking mucinex 4 tablets a day ( 2 tabs BID) 800 mg total dose.  Pt is wondering if an appt is needed or if this is normal.  Pt feels lightheaded. Best number to reach [phone].

## 2014-07-30 ENCOUNTER — Other Ambulatory Visit: Payer: Self-pay | Admitting: Family Medicine

## 2014-07-30 ENCOUNTER — Other Ambulatory Visit (INDEPENDENT_AMBULATORY_CARE_PROVIDER_SITE_OTHER): Payer: BLUE CROSS/BLUE SHIELD

## 2014-07-30 DIAGNOSIS — E039 Hypothyroidism, unspecified: Secondary | ICD-10-CM

## 2014-07-30 DIAGNOSIS — R739 Hyperglycemia, unspecified: Secondary | ICD-10-CM

## 2014-07-30 DIAGNOSIS — I10 Essential (primary) hypertension: Secondary | ICD-10-CM

## 2014-07-30 DIAGNOSIS — M8580 Other specified disorders of bone density and structure, unspecified site: Secondary | ICD-10-CM

## 2014-07-30 LAB — COMPREHENSIVE METABOLIC PANEL
ALBUMIN: 3.9 g/dL (ref 3.5–5.2)
ALT: 25 U/L (ref 0–35)
AST: 26 U/L (ref 0–37)
Alkaline Phosphatase: 73 U/L (ref 39–117)
BUN: 16 mg/dL (ref 6–23)
CHLORIDE: 103 meq/L (ref 96–112)
CO2: 33 meq/L — AB (ref 19–32)
Calcium: 9.1 mg/dL (ref 8.4–10.5)
Creatinine, Ser: 0.64 mg/dL (ref 0.40–1.20)
GFR: 98.08 mL/min (ref >60.00)
GLUCOSE: 106 mg/dL — AB (ref 70–99)
POTASSIUM: 3.9 meq/L (ref 3.5–5.1)
SODIUM: 139 meq/L (ref 135–145)
Total Bilirubin: 0.5 mg/dL (ref 0.2–1.2)
Total Protein: 6.5 g/dL (ref 6.0–8.3)

## 2014-07-30 LAB — LIPID PANEL
CHOL/HDL RATIO: 3
Cholesterol: 133 mg/dL (ref 0–200)
HDL: 41.4 mg/dL (ref >39.00)
LDL Cholesterol: 58 mg/dL (ref 0–99)
NonHDL: 91.6
Triglycerides: 170 mg/dL — ABNORMAL HIGH (ref 0.0–149.0)
VLDL: 34 mg/dL (ref 0.0–40.0)

## 2014-07-30 LAB — TSH: TSH: 7.35 u[IU]/mL — ABNORMAL HIGH (ref 0.35–4.50)

## 2014-07-30 LAB — HEMOGLOBIN A1C: Hgb A1c MFr Bld: 5.4 % (ref 4.6–6.5)

## 2014-07-30 LAB — VITAMIN D 25 HYDROXY (VIT D DEFICIENCY, FRACTURES): VITD: 28.32 ng/mL — AB (ref 30.00–100.00)

## 2014-08-06 ENCOUNTER — Encounter: Payer: Self-pay | Admitting: Family Medicine

## 2014-08-06 ENCOUNTER — Ambulatory Visit (INDEPENDENT_AMBULATORY_CARE_PROVIDER_SITE_OTHER): Payer: BLUE CROSS/BLUE SHIELD | Admitting: Family Medicine

## 2014-08-06 VITALS — BP 112/74 | HR 68 | Temp 97.9°F | Ht 65.0 in | Wt 234.5 lb

## 2014-08-06 DIAGNOSIS — E039 Hypothyroidism, unspecified: Secondary | ICD-10-CM

## 2014-08-06 DIAGNOSIS — Z7189 Other specified counseling: Secondary | ICD-10-CM

## 2014-08-06 DIAGNOSIS — E785 Hyperlipidemia, unspecified: Secondary | ICD-10-CM

## 2014-08-06 DIAGNOSIS — M8580 Other specified disorders of bone density and structure, unspecified site: Secondary | ICD-10-CM

## 2014-08-06 DIAGNOSIS — Z0000 Encounter for general adult medical examination without abnormal findings: Secondary | ICD-10-CM

## 2014-08-06 DIAGNOSIS — I10 Essential (primary) hypertension: Secondary | ICD-10-CM

## 2014-08-06 DIAGNOSIS — Z23 Encounter for immunization: Secondary | ICD-10-CM

## 2014-08-06 MED ORDER — ASPIRIN EC 81 MG PO TBEC: 81.0000 mg | DELAYED_RELEASE_TABLET | Freq: Every day | ORAL | Status: DC

## 2014-08-06 MED ORDER — LEVOTHYROXINE SODIUM 150 MCG PO TABS: 150.0000 ug | ORAL_TABLET | Freq: Every day | ORAL | Status: DC

## 2014-08-06 MED ORDER — OMEPRAZOLE 20 MG PO CPDR: 20.0000 mg | DELAYED_RELEASE_CAPSULE | Freq: Every day | ORAL | Status: DC

## 2014-08-06 MED ORDER — ROSUVASTATIN CALCIUM 10 MG PO TABS: 5.0000 mg | ORAL_TABLET | Freq: Every day | ORAL | Status: DC

## 2014-08-06 MED ORDER — PROPRANOLOL HCL ER 120 MG PO CP24: 120.0000 mg | ORAL_CAPSULE | Freq: Every day | ORAL | Status: DC

## 2014-08-06 NOTE — Patient Instructions (Addendum)
Increase to 2 vitamin D pills a day.  Take 1.5 thyroid pills on Sunday, 1 a day otherwise.   Recheck labs in about 2 months.  Stop the crestor for now and see if the aches get better.  Let me know.  Rosaria Ferries will call about your referral. Get back to exercising.  Glad to see you.  Take care.

## 2014-08-06 NOTE — Progress Notes (Signed)
Pre visit review using our clinic review tool, if applicable. No additional management support is needed unless otherwise documented below in the visit note.  CPE- See plan.  Routine anticipatory guidance given to patient.  See health maintenance. Vaccines up to date.  Pap not indicated.  Colonoscopy 2013 Mammogram 2015. She'll call about scheduling after 09/2014 DXA done 2013, ordered repeat.  H/o low vit D and osteopenia.  D/w pt about vit d replacement.  Living will d/w pt. Would have her husband designated if incapacitated.  Diet and exercise d/w pt.  Wants to get back to exercising.  She wants to get back into classes but she'll have to work around her work schedule.  Discussed.  Diet encouraged.  He is working on getting the ambition to cut out carbs.    Hypothyroid.  No lumps, no masses, no dysphagia.    Hypertension:    Using medication without problems or lightheadedness: occ lightheaded, she is careful with position change.   Chest pain with exertion:no Edema:no Short of breath: likely due to weight, at baseline.    Elevated Cholesterol: Using medications without problems:yes Muscle aches: occ cramping in legs at night.  Diet compliance: see above Exercise: see above  Rare migraines now.  She saw ortho about her wrist pain, OA locally.  She has f/u pending with ortho today.    PMH and SH reviewed  Meds, vitals, and allergies reviewed.   ROS: See HPI.  Otherwise negative.    GEN: nad, alert and oriented HEENT: mucous membranes moist NECK: supple w/o LA, no tmg CV: rrr. PULM: ctab, no inc wob ABD: soft, +bs EXT: no edema SKIN: no acute rash

## 2014-08-07 DIAGNOSIS — Z7189 Other specified counseling: Secondary | ICD-10-CM | POA: Insufficient documentation

## 2014-08-07 NOTE — Assessment & Plan Note (Signed)
Routine anticipatory guidance given to patient.  See health maintenance. Vaccines up to date.  Pap not indicated.  Colonoscopy 2013 Mammogram 2015. She'll call about scheduling after 09/2014 DXA done 2013, ordered repeat.  H/o low vit D and osteopenia.  D/w pt about vit d replacement.  Living will d/w pt. Would have her husband designated if incapacitated.  Diet and exercise d/w pt.  Wants to get back to exercising.  She wants to get back into classes but she'll have to work around her work schedule.  Discussed.  Diet encouraged.  He is working on getting the ambition to cut out carbs.

## 2014-08-07 NOTE — Assessment & Plan Note (Signed)
Continue as is for now.  D/w pt.  Lab d/w pt.

## 2014-08-07 NOTE — Assessment & Plan Note (Signed)
Hold statin for a brief time, see if aches improve and then report back.  Labs d/w pt. She agrees. D/w pt about diet and exercise.

## 2014-08-07 NOTE — Assessment & Plan Note (Signed)
TSH up. Take 1.5 thyroid pills on Sunday, 1 a day otherwise.  Recheck labs in about 2 months.  She agrees.

## 2014-08-10 ENCOUNTER — Telehealth: Payer: Self-pay

## 2014-08-10 NOTE — Telephone Encounter (Signed)
Pt sched to see Dr. Rockey Situ 09/17/14 for yearly f/u.

## 2014-08-10 NOTE — Telephone Encounter (Signed)
Pt states Saturday night she had an episode of afib. States she took her extra medication Dr. Rockey Situ gave her and it went away in 30 minutes. She just wanted [place] to be aware.

## 2014-09-17 ENCOUNTER — Ambulatory Visit (INDEPENDENT_AMBULATORY_CARE_PROVIDER_SITE_OTHER): Payer: BLUE CROSS/BLUE SHIELD | Admitting: Cardiovascular Disease

## 2014-09-17 ENCOUNTER — Encounter: Payer: Self-pay | Admitting: Cardiovascular Disease

## 2014-09-17 VITALS — BP 110/80 | HR 81 | Ht 65.0 in | Wt 236.2 lb

## 2014-09-17 DIAGNOSIS — I471 Supraventricular tachycardia: Secondary | ICD-10-CM

## 2014-09-17 DIAGNOSIS — E669 Obesity, unspecified: Secondary | ICD-10-CM

## 2014-09-17 DIAGNOSIS — R000 Tachycardia, unspecified: Secondary | ICD-10-CM | POA: Diagnosis not present

## 2014-09-17 DIAGNOSIS — E785 Hyperlipidemia, unspecified: Secondary | ICD-10-CM

## 2014-09-17 DIAGNOSIS — I4891 Unspecified atrial fibrillation: Secondary | ICD-10-CM

## 2014-09-17 DIAGNOSIS — I491 Atrial premature depolarization: Secondary | ICD-10-CM

## 2014-09-17 DIAGNOSIS — G459 Transient cerebral ischemic attack, unspecified: Secondary | ICD-10-CM

## 2014-09-17 DIAGNOSIS — I10 Essential (primary) hypertension: Secondary | ICD-10-CM

## 2014-09-17 MED ORDER — ROSUVASTATIN CALCIUM 10 MG PO TABS: 10.0000 mg | ORAL_TABLET | Freq: Every day | ORAL | Status: DC

## 2014-09-17 MED ORDER — PROPRANOLOL HCL ER 120 MG PO CP24: 120.0000 mg | ORAL_CAPSULE | Freq: Every day | ORAL | Status: DC

## 2014-09-17 NOTE — Progress Notes (Signed)
Patient ID: Marji Kuehnel, female    DOB: 14-May-1946, 69 y.o.   MRN: [id number]  HPI Comments: Ms. Tryon is a very pleasant 68 year old woman with obesity, hypothyroidism, remote smoking history for less than 10 years, prior TIA.  History of atrial fibrillation seen on Holter monitor in 2013 She presents today for follow-up of her arrhythmia  She reports that approximately one month ago February 22nd 2016 she had 30 minutes of tachycardia. She is concerned this could have been atrial fibrillation She denies any TIA or stroke type symptoms. She denies any sleep apnea type symptoms. She does have chronic fatigue, in the daytime sometimes. Weight continues to be a issue. No regular exercise program Inconsistent with her medications. She wonders if she had the tachycardia when she missed a dose or 2 of her propranolol. Propranolol seems to make her tired possibly  EKG on today's visit shows normal sinus rhythm with rate 81 bpm, no significant ST or T-wave changes  Other past medical history  08/25/2011,  right eye vision changes, right arm weakness and numbness lasting for 30-40 minutes. Symptoms seemed to resolve. The next day, on Saturday, she had some mouth and facial numbness and she went to the emergency room. She also had headache. They did a EKG and CT scan of the head that showed no stroke. Early in the week, she continued to have headache and mouth issues and she was taken to [facility]. She had MRA that showed small vessel disease, carotid ultrasound that showed mild plaque. She reports that she was in the hospital for 3 days. No atrial fibrillation was seen.   Echocardiogram was essentially normal. Prior episodes of tachycardia lasting less than 1 minute on her last clinic visit   Previous Holter monitor that showed short periods of supraventricular tachycardia, rare APCs, long stretches of atrial fibrillation. She did not have any symptoms while wearing the monitor   Allergies   Allergen Reactions  . Celecoxib     REACTION: nausea: GI UPSET  . Nickel     Skin rash, allergy  . Simvastatin     Aches but tolerates crestor    Outpatient Encounter Prescriptions as of 09/17/2014  Medication Sig  . aspirin EC 81 MG tablet Take 1 tablet (81 mg total) by mouth daily.  . cholecalciferol (VITAMIN D) 1000 UNITS tablet Take by mouth. Take two tablets daily  . levothyroxine (SYNTHROID, LEVOTHROID) 150 MCG tablet Take 1 tablet (150 mcg total) by mouth daily. Except for 1.5 on Sunday  . Multiple Vitamins-Minerals (CENTRUM SILVER ULTRA WOMENS PO) Take by mouth daily.   Marland Kitchen omeprazole (PRILOSEC) 20 MG capsule Take 1 capsule (20 mg total) by mouth daily.  . propranolol ER (INDERAL LA) 120 MG 24 hr capsule Take 1 capsule (120 mg total) by mouth daily.  . rosuvastatin (CRESTOR) 10 MG tablet Take 1 tablet (10 mg total) by mouth daily.  . vitamin B-12 (CYANOCOBALAMIN) 500 MCG tablet Take 500 mcg by mouth daily.  . [DISCONTINUED] propranolol ER (INDERAL LA) 120 MG 24 hr capsule Take 1 capsule (120 mg total) by mouth daily.  . [DISCONTINUED] rosuvastatin (CRESTOR) 10 MG tablet Take 0.5 tablets (5 mg total) by mouth daily.    Past Medical History  Diagnosis Date  . Diverticulosis 03/1997  . GERD (gastroesophageal reflux disease) 1997    good on prilosec  . Hyperlipidemia 08/1996  . Hypothyroidism 1984  . Osteopenia 11/2002    via dexa  . Atrial fib/flutter, transient  1 episode, resolved, never on coumadin  . Migraine     w/o aura  . Dysrhythmia     atrial fibrilation  . Hepatitis     hx of hep B  . Stroke 08/2011    TIA  . Cancer     skin cancer    Past Surgical History  Procedure Laterality Date  . Polypectomy  04/06/97    colon, benign by pathology  . Partial hysterectomy  1978-79  . Cholecystectomy  07/2006  . 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 Nasolabial fold skin flap surgery  08/02/2007    [facility]  . Varicose vein  ablation sclerotherapy  09/2008    Dr. Hulda Humphrey  . Abdominal hysterectomy    . Anal fissure repair      Social History  reports that she quit smoking about 28 years ago. She has never used smokeless tobacco. She reports that she does not drink alcohol or use illicit drugs.  Family History family history includes Allergies in her sister; Colon cancer (age of onset: 23) in her father; Hypertension in her mother; Osteopenia in her mother; Stroke in her mother. There is no history of Breast cancer.   Review of Systems  Constitutional: Negative.   Respiratory: Negative.   Cardiovascular: Positive for palpitations.  Gastrointestinal: Negative.   Musculoskeletal: Negative.   Skin: Negative.   Neurological: Negative.   Hematological: Negative.   Psychiatric/Behavioral: Positive for sleep disturbance. The patient is nervous/anxious.   All other systems reviewed and are negative.   BP 110/80 mmHg  Pulse 81  Ht 5\' 5"  (1.651 m)  Wt 236 lb 4 oz (107.162 kg)  BMI 39.31 kg/m2  Physical Exam  Constitutional: She is oriented to person, place, and time. She appears well-developed and well-nourished.  Obese  HENT:  Head: Normocephalic.  Nose: Nose normal.  Mouth/Throat: Oropharynx is clear and moist.  Eyes: Conjunctivae are normal. Pupils are equal, round, and reactive to light.  Neck: Normal range of motion. Neck supple. No JVD present.  Cardiovascular: Normal rate, regular rhythm, S1 normal, S2 normal, normal heart sounds and intact distal pulses.  Exam reveals no gallop and no friction rub.   No murmur heard. Pulmonary/Chest: Effort normal and breath sounds normal. No respiratory distress. She has no wheezes. She has no rales. She exhibits no tenderness.  Abdominal: Soft. Bowel sounds are normal. She exhibits no distension. There is no tenderness.  Musculoskeletal: Normal range of motion. She exhibits no edema or tenderness.  Lymphadenopathy:    She has no cervical adenopathy.   Neurological: She is alert and oriented to person, place, and time. Coordination normal.  Skin: Skin is warm and dry. No rash noted. No erythema.  Psychiatric: She has a normal mood and affect. Her behavior is normal. Judgment and thought content normal.    Assessment and Plan  Nursing note and vitals reviewed.

## 2014-09-17 NOTE — Assessment & Plan Note (Signed)
We have encouraged continued exercise, careful diet management in an effort to lose weight. 

## 2014-09-17 NOTE — Patient Instructions (Addendum)
You are doing well. No medication changes were made.  We will order a 30 day monitor for atrial fibrillation, SVT, APCs, tachycardia  Please call us if you have new issues that need to be addressed before your next appt.  Your physician wants you to follow-up in: 6 months.  You will receive a reminder letter in the mail two months in advance. If you don't receive a letter, please call our office to schedule the follow-up appointment.

## 2014-09-17 NOTE — Assessment & Plan Note (Signed)
Cholesterol is at goal on the current lipid regimen. No changes to the medications were made.  

## 2014-09-17 NOTE — Assessment & Plan Note (Signed)
Long discussion today whether she needs anticoagulation given her symptoms. Holter again reviewed with her. Clear signs of SVT, unable to exclude atrial fibrillation. Before starting anticoagulation, we have recommended she wear a 30 day monitor to definitively determine if she is having paroxysmal atrial fibrillation. No further TIA or stroke symptoms recently

## 2014-09-17 NOTE — Assessment & Plan Note (Signed)
No further TIA or stroke type symptoms. For now she will continue low-dose aspirin

## 2014-09-17 NOTE — Assessment & Plan Note (Signed)
Recommended compliance with her propranolol for rhythm control Blood pressure is well controlled on today's visit. No changes made to the medications.

## 2014-09-21 ENCOUNTER — Telehealth: Payer: Self-pay | Admitting: *Deleted

## 2014-09-21 NOTE — Telephone Encounter (Signed)
E[facility] has made several attempts to contact pt and verify information but have not been successful.

## 2014-09-24 DIAGNOSIS — I4891 Unspecified atrial fibrillation: Secondary | ICD-10-CM | POA: Diagnosis not present

## 2014-10-05 ENCOUNTER — Other Ambulatory Visit: Payer: BLUE CROSS/BLUE SHIELD

## 2014-10-07 ENCOUNTER — Telehealth: Payer: Self-pay | Admitting: *Deleted

## 2014-10-07 MED ORDER — RIVAROXABAN 20 MG PO TABS: 20.0000 mg | ORAL_TABLET | Freq: Every day | ORAL | Status: DC

## 2014-10-07 NOTE — Telephone Encounter (Signed)
Could start one of the blood thinners Would stop aspirin when she does Options include xarelto 20 mg daily Or eliquis 5 mg twice a day

## 2014-10-07 NOTE — Telephone Encounter (Signed)
Spoke w/ pt.  Advised her of Dr. Donivan Scull recommendation.  She would like to try the Xarelto, as it is once daily dosing.  Advised her that I am leaving samples at the front desk for her to p/u at her convenience.

## 2014-10-07 NOTE — Telephone Encounter (Signed)
Patient called and unable to wear heart monitor due to makes her itch and "gets on her nerves".  Does she need to go on blood thinners? Please call patient.

## 2014-10-12 ENCOUNTER — Ambulatory Visit: Admit: 2014-10-12 | Disposition: A | Discharge status: Home / Self Care | Payer: Self-pay | Admitting: Family Medicine

## 2014-10-26 ENCOUNTER — Other Ambulatory Visit (INDEPENDENT_AMBULATORY_CARE_PROVIDER_SITE_OTHER): Payer: BLUE CROSS/BLUE SHIELD

## 2014-10-26 DIAGNOSIS — M8580 Other specified disorders of bone density and structure, unspecified site: Secondary | ICD-10-CM | POA: Diagnosis not present

## 2014-10-26 DIAGNOSIS — E039 Hypothyroidism, unspecified: Secondary | ICD-10-CM | POA: Diagnosis not present

## 2014-10-26 LAB — TSH: TSH: 2.73 u[IU]/mL (ref 0.35–4.50)

## 2014-10-26 LAB — VITAMIN D 25 HYDROXY (VIT D DEFICIENCY, FRACTURES): VITD: 17.18 ng/mL — ABNORMAL LOW (ref 30.00–100.00)

## 2014-11-01 ENCOUNTER — Other Ambulatory Visit: Payer: Self-pay | Admitting: Family Medicine

## 2014-11-01 DIAGNOSIS — E559 Vitamin D deficiency, unspecified: Secondary | ICD-10-CM

## 2014-11-01 MED ORDER — VITAMIN D (ERGOCALCIFEROL) 1.25 MG (50000 UNIT) PO CAPS: 50000.0000 [IU] | ORAL_CAPSULE | ORAL | Status: DC

## 2014-11-03 ENCOUNTER — Telehealth: Payer: Self-pay

## 2014-11-03 NOTE — Telephone Encounter (Signed)
Attempted to contact pt regarding 30 day event monitor results:  "NSR w/ no significant arrhythmia.  Rare PVCs"  Left message for pt to call back.

## 2014-11-03 NOTE — Telephone Encounter (Signed)
Left message on pt's vm w/ results.  Asked pt to call back w/ any questions or concerns.

## 2014-11-05 ENCOUNTER — Encounter (INDEPENDENT_AMBULATORY_CARE_PROVIDER_SITE_OTHER): Payer: BLUE CROSS/BLUE SHIELD

## 2014-11-05 ENCOUNTER — Other Ambulatory Visit: Payer: Self-pay

## 2014-11-05 DIAGNOSIS — I471 Supraventricular tachycardia: Secondary | ICD-10-CM

## 2014-11-05 DIAGNOSIS — R000 Tachycardia, unspecified: Secondary | ICD-10-CM

## 2014-11-05 DIAGNOSIS — I4891 Unspecified atrial fibrillation: Secondary | ICD-10-CM

## 2014-11-05 DIAGNOSIS — I491 Atrial premature depolarization: Secondary | ICD-10-CM

## 2014-12-23 ENCOUNTER — Other Ambulatory Visit: Payer: Self-pay | Admitting: *Deleted

## 2014-12-23 NOTE — Telephone Encounter (Signed)
Pt had a cpx on 08/06/14, no future appt scheduled.

## 2014-12-24 MED ORDER — PROMETHAZINE HCL 25 MG PO TABS: 25.0000 mg | ORAL_TABLET | Freq: Four times a day (QID) | ORAL | Status: DC | PRN

## 2014-12-24 NOTE — Telephone Encounter (Signed)
Sent. Thanks.   

## 2014-12-25 ENCOUNTER — Telehealth: Payer: Self-pay | Admitting: *Deleted

## 2014-12-25 MED ORDER — RIVAROXABAN 20 MG PO TABS: 20.0000 mg | ORAL_TABLET | Freq: Every day | ORAL | Status: DC

## 2014-12-25 NOTE — Telephone Encounter (Signed)
Pt requesting Eliquis Rx and samples. I do not see Eliquis on medication list please advise.

## 2014-12-25 NOTE — Telephone Encounter (Signed)
Pt is asking if we can send 90 day supply on Eliquis to [facility]  She is currently getting 30 days but runs out and then has to wait a day or two  She would also like samples for she only has about 4 pills left. Please advise.

## 2014-12-25 NOTE — Telephone Encounter (Signed)
Spoke w/ pt.  She is requesting 90 day supply of Xarelto.  Advised her that I am sending this in and leaving samples at the front desk for her to p/u at her convenience.

## 2015-01-13 ENCOUNTER — Telehealth: Payer: Self-pay | Admitting: Family Medicine

## 2015-01-13 NOTE — Telephone Encounter (Signed)
Tell [employer] that DXA was done due to h/o low vit D and osteopenia.  It's listed in the chart/OV note from 08/06/14.  [employer] should be able to read the note for themselves if the have any questions.   At this point, [employer] should know that a screening mammogram is for breast cancer screening and has nothing to do with bone density testing.

## 2015-01-13 NOTE — Telephone Encounter (Addendum)
Breanna w/[employer] called and is questioning why pt had a bone density on 10/12/2014.  Bubba Hales states that the bone density was done after the mammogram and wants to know if the mammogram was not clear so that was the reason for the bone density or was the bone density routine.  Basically she wants to know the reason (dx) for the bone density.  I can't find an exact reason in the chart, she needs to know in order for the claim to be paid/denied.  Could you please review the chart?  Bubba Hales is waiting for a c/b from someone before processing the claim as denied. Thank you.  Note:  Whenever calling Bubba Hales requested ref [id number]

## 2015-01-15 NOTE — Telephone Encounter (Signed)
Contacted [employer] and advised per Dr. Josefine Class note.

## 2015-04-05 ENCOUNTER — Ambulatory Visit (INDEPENDENT_AMBULATORY_CARE_PROVIDER_SITE_OTHER): Payer: BLUE CROSS/BLUE SHIELD

## 2015-04-05 ENCOUNTER — Encounter: Payer: Self-pay | Admitting: Cardiovascular Disease

## 2015-04-05 ENCOUNTER — Ambulatory Visit (INDEPENDENT_AMBULATORY_CARE_PROVIDER_SITE_OTHER): Payer: BLUE CROSS/BLUE SHIELD | Admitting: Cardiovascular Disease

## 2015-04-05 ENCOUNTER — Other Ambulatory Visit (INDEPENDENT_AMBULATORY_CARE_PROVIDER_SITE_OTHER): Payer: BLUE CROSS/BLUE SHIELD

## 2015-04-05 VITALS — BP 120/80 | HR 61 | Ht 65.0 in | Wt 240.0 lb

## 2015-04-05 DIAGNOSIS — E785 Hyperlipidemia, unspecified: Secondary | ICD-10-CM

## 2015-04-05 DIAGNOSIS — G459 Transient cerebral ischemic attack, unspecified: Secondary | ICD-10-CM

## 2015-04-05 DIAGNOSIS — Z23 Encounter for immunization: Secondary | ICD-10-CM

## 2015-04-05 DIAGNOSIS — E559 Vitamin D deficiency, unspecified: Secondary | ICD-10-CM | POA: Diagnosis not present

## 2015-04-05 DIAGNOSIS — I4891 Unspecified atrial fibrillation: Secondary | ICD-10-CM | POA: Diagnosis not present

## 2015-04-05 DIAGNOSIS — I10 Essential (primary) hypertension: Secondary | ICD-10-CM | POA: Diagnosis not present

## 2015-04-05 LAB — VITAMIN D 25 HYDROXY (VIT D DEFICIENCY, FRACTURES): VITD: 20.98 ng/mL — AB (ref 30.00–100.00)

## 2015-04-05 MED ORDER — DILTIAZEM HCL 30 MG PO TABS: 30.0000 mg | ORAL_TABLET | Freq: Three times a day (TID) | ORAL | Status: DC | PRN

## 2015-04-05 NOTE — Assessment & Plan Note (Signed)
By her symptoms, I suspect she continues to have short runs of paroxysmal atrial fibrillation She will take diltiazem 30 mg pills as needed for any symptoms that are prolonged

## 2015-04-05 NOTE — Assessment & Plan Note (Signed)
We have encouraged continued exercise, careful diet management in an effort to lose weight. 

## 2015-04-05 NOTE — Assessment & Plan Note (Signed)
Cholesterol is at goal on the current lipid regimen. No changes to the medications were made.  

## 2015-04-05 NOTE — Assessment & Plan Note (Signed)
No further TIA or stroke symptoms. Recommended she stay on her anticoagulation

## 2015-04-05 NOTE — Progress Notes (Signed)
Patient ID: Kara Wright, female    DOB: 1946/03/18, 69 y.o.   MRN: [id number]  HPI Comments: [name] is a very pleasant 69 year old woman with obesity, hypothyroidism, remote smoking history for less than 10 years, prior TIA.CVA.  History of atrial fibrillation seen on Holter monitor in 2013 She presents today for follow-up of her arrhythmia  In follow-up, she reports that she is doing well. She has had 3 short episodes of atrial fibrillation, nothing lasting more than 30 minutes. She has diltiazem that she can take as needed. Pills are more than 68 years old Continues to work on her weight. No regular exercise program Tolerating anticoagulation No TIA or stroke symptoms  EKG on today's visit shows normal sinus rhythm with rate 61 bpm, no significant ST or T-wave changes  Other past medical history  February 22nd 2016 she had 30 minutes of tachycardia. She is concerned this could have been atrial fibrillation   08/25/2011,  right eye vision changes, right arm weakness and numbness lasting for 30-40 minutes. Symptoms seemed to resolve. The next day, on Saturday, she had some mouth and facial numbness and she went to the emergency room. She also had headache. They did a EKG and CT scan of the head that showed no stroke. Early in the week, she continued to have headache and mouth issues and she was taken to [facility]. She had MRA that showed small vessel disease, carotid ultrasound that showed mild plaque. She reports that she was in the hospital for 3 days. No atrial fibrillation was seen.   Echocardiogram was essentially normal. Prior episodes of tachycardia lasting less than 1 minute on her last clinic visit   Previous Holter monitor that showed short periods of supraventricular tachycardia, rare APCs, long stretches of atrial fibrillation. She did not have any symptoms while wearing the monitor   Allergies  Allergen Reactions  . Celecoxib     REACTION: nausea: GI UPSET  .  Nickel     Skin rash, allergy  . Simvastatin     Aches but tolerates crestor    Outpatient Encounter Prescriptions as of 04/05/2015  Medication Sig  . levothyroxine (SYNTHROID, LEVOTHROID) 150 MCG tablet Take 1 tablet (150 mcg total) by mouth daily. Except for 1.5 on Sunday  . omeprazole (PRILOSEC) 20 MG capsule Take 1 capsule (20 mg total) by mouth daily.  . promethazine (PHENERGAN) 25 MG tablet Take 1 tablet (25 mg total) by mouth every 6 (six) hours as needed. For nausea  . propranolol ER (INDERAL LA) 120 MG 24 hr capsule Take 1 capsule (120 mg total) by mouth daily.  . rivaroxaban (XARELTO) 20 MG TABS tablet Take 1 tablet (20 mg total) by mouth daily with supper.  . rosuvastatin (CRESTOR) 10 MG tablet Take 1 tablet (10 mg total) by mouth daily.  Marland Kitchen diltiazem (CARDIZEM) 30 MG tablet Take 1 tablet (30 mg total) by mouth 3 (three) times daily as needed.  . [DISCONTINUED] Multiple Vitamins-Minerals (CENTRUM SILVER ULTRA WOMENS PO) Take by mouth daily.   . [DISCONTINUED] vitamin B-12 (CYANOCOBALAMIN) 500 MCG tablet Take 500 mcg by mouth daily.  . [DISCONTINUED] Vitamin D, Ergocalciferol, (DRISDOL) 50000 UNITS CAPS capsule Take 1 capsule (50,000 Units total) by mouth every 7 (seven) days. (Patient not taking: Reported on 04/05/2015)   No facility-administered encounter medications on file as of 04/05/2015.    Past Medical History  Diagnosis Date  . Diverticulosis 03/1997  . GERD (gastroesophageal reflux disease) 1997    good  on prilosec  . Hyperlipidemia 08/1996  . Hypothyroidism 1984  . Osteopenia 11/2002    via dexa  . Atrial fib/flutter, transient     1 episode, resolved, never on coumadin  . Migraine     w/o aura  . Dysrhythmia     atrial fibrilation  . Hepatitis     hx of hep B  . Stroke [facility]) 08/2011    TIA  . Cancer [facility])     skin cancer    Past Surgical History  Procedure Laterality Date  . Polypectomy  04/06/97    colon, benign by pathology  . Partial hysterectomy   1978-79  . Cholecystectomy  07/2006  . 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 Nasolabial fold skin flap surgery  08/02/2007    [facility]  . Varicose vein ablation sclerotherapy  09/2008    Dr. Hulda Humphrey  . Abdominal hysterectomy    . Anal fissure repair      Social History  reports that she quit smoking about 28 years ago. She has never used smokeless tobacco. She reports that she does not drink alcohol or use illicit drugs.  Family History family history includes Allergies in her sister; Colon cancer (age of onset: 10) in her father; Hypertension in her mother; Osteopenia in her mother; Stroke in her mother. There is no history of Breast cancer.   Review of Systems  Constitutional: Negative.   Respiratory: Negative.   Cardiovascular: Positive for palpitations.  Gastrointestinal: Negative.   Musculoskeletal: Negative.   Skin: Negative.   Neurological: Negative.   Hematological: Negative.   Psychiatric/Behavioral: Positive for sleep disturbance. The patient is nervous/anxious.   All other systems reviewed and are negative.   BP 120/80 mmHg  Pulse 61  Ht 5\' 5"  (1.651 m)  Wt 240 lb (108.863 kg)  BMI 39.94 kg/m2  Physical Exam  Constitutional: She is oriented to person, place, and time. She appears well-developed and well-nourished.  Obese  HENT:  Head: Normocephalic.  Nose: Nose normal.  Mouth/Throat: Oropharynx is clear and moist.  Eyes: Conjunctivae are normal. Pupils are equal, round, and reactive to light.  Neck: Normal range of motion. Neck supple. No JVD present.  Cardiovascular: Normal rate, regular rhythm, S1 normal, S2 normal, normal heart sounds and intact distal pulses.  Exam reveals no gallop and no friction rub.   No murmur heard. Pulmonary/Chest: Effort normal and breath sounds normal. No respiratory distress. She has no wheezes. She has no rales. She exhibits no tenderness.  Abdominal: Soft. Bowel sounds are normal. She exhibits  no distension. There is no tenderness.  Musculoskeletal: Normal range of motion. She exhibits no edema or tenderness.  Lymphadenopathy:    She has no cervical adenopathy.  Neurological: She is alert and oriented to person, place, and time. Coordination normal.  Skin: Skin is warm and dry. No rash noted. No erythema.  Psychiatric: She has a normal mood and affect. Her behavior is normal. Judgment and thought content normal.    Assessment and Plan  Nursing note and vitals reviewed.

## 2015-04-05 NOTE — Patient Instructions (Signed)
You are doing well. No medication changes were made.  Please call us if you have new issues that need to be addressed before your next appt.  Your physician wants you to follow-up in: 12 months.  You will receive a reminder letter in the mail two months in advance. If you don't receive a letter, please call our office to schedule the follow-up appointment. 

## 2015-04-07 ENCOUNTER — Other Ambulatory Visit: Payer: Self-pay | Admitting: Family Medicine

## 2015-04-07 DIAGNOSIS — E559 Vitamin D deficiency, unspecified: Secondary | ICD-10-CM

## 2015-04-07 MED ORDER — VITAMIN D (ERGOCALCIFEROL) 1.25 MG (50000 UNIT) PO CAPS: ORAL_CAPSULE | ORAL | Status: DC

## 2015-07-22 ENCOUNTER — Telehealth: Payer: Self-pay | Admitting: *Deleted

## 2015-07-22 NOTE — Telephone Encounter (Signed)
Patient calling the office for samples of medication:   1.  What medication and dosage are you requesting samples for? Xarelto 20 mg  along with Coupon for this year  2.  Are you currently out of this medication?  Has only 4 pills left.

## 2015-07-22 NOTE — Telephone Encounter (Signed)
Xarelto 20 mg samples placed at front desk for pick up. 

## 2015-07-23 ENCOUNTER — Other Ambulatory Visit: Payer: Self-pay | Admitting: Family Medicine

## 2015-07-23 DIAGNOSIS — E039 Hypothyroidism, unspecified: Secondary | ICD-10-CM

## 2015-07-23 DIAGNOSIS — E785 Hyperlipidemia, unspecified: Secondary | ICD-10-CM

## 2015-07-26 ENCOUNTER — Encounter: Payer: BLUE CROSS/BLUE SHIELD | Admitting: Family Medicine

## 2015-07-26 ENCOUNTER — Other Ambulatory Visit (INDEPENDENT_AMBULATORY_CARE_PROVIDER_SITE_OTHER): Payer: PPO

## 2015-07-26 DIAGNOSIS — E039 Hypothyroidism, unspecified: Secondary | ICD-10-CM | POA: Diagnosis not present

## 2015-07-26 DIAGNOSIS — E559 Vitamin D deficiency, unspecified: Secondary | ICD-10-CM

## 2015-07-26 DIAGNOSIS — E785 Hyperlipidemia, unspecified: Secondary | ICD-10-CM | POA: Diagnosis not present

## 2015-07-26 LAB — LIPID PANEL
CHOL/HDL RATIO: 3
Cholesterol: 116 mg/dL (ref 0–200)
HDL: 38.8 mg/dL — ABNORMAL LOW (ref >39.00)
LDL CALC: 43 mg/dL (ref 0–99)
NONHDL: 77.18
Triglycerides: 172 mg/dL — ABNORMAL HIGH (ref 0.0–149.0)
VLDL: 34.4 mg/dL (ref 0.0–40.0)

## 2015-07-26 LAB — COMPREHENSIVE METABOLIC PANEL
ALT: 15 U/L (ref 0–35)
AST: 18 U/L (ref 0–37)
Albumin: 4.1 g/dL (ref 3.5–5.2)
Alkaline Phosphatase: 76 U/L (ref 39–117)
BILIRUBIN TOTAL: 0.7 mg/dL (ref 0.2–1.2)
BUN: 17 mg/dL (ref 6–23)
CALCIUM: 9.5 mg/dL (ref 8.4–10.5)
CO2: 32 meq/L (ref 19–32)
CREATININE: 0.74 mg/dL (ref 0.40–1.20)
Chloride: 101 mEq/L (ref 96–112)
GFR: 82.71 mL/min (ref >60.00)
GLUCOSE: 105 mg/dL — AB (ref 70–99)
Potassium: 4.2 mEq/L (ref 3.5–5.1)
SODIUM: 139 meq/L (ref 135–145)
Total Protein: 6.8 g/dL (ref 6.0–8.3)

## 2015-07-26 LAB — TSH: TSH: 0.35 u[IU]/mL (ref 0.35–4.50)

## 2015-07-26 LAB — VITAMIN D 25 HYDROXY (VIT D DEFICIENCY, FRACTURES): VITD: 27.32 ng/mL — ABNORMAL LOW (ref 30.00–100.00)

## 2015-08-02 ENCOUNTER — Ambulatory Visit (INDEPENDENT_AMBULATORY_CARE_PROVIDER_SITE_OTHER): Payer: PPO | Admitting: Family Medicine

## 2015-08-02 ENCOUNTER — Encounter: Payer: Self-pay | Admitting: Family Medicine

## 2015-08-02 ENCOUNTER — Telehealth: Payer: Self-pay | Admitting: *Deleted

## 2015-08-02 VITALS — BP 122/70 | HR 71 | Temp 98.2°F | Ht 65.0 in | Wt 228.2 lb

## 2015-08-02 DIAGNOSIS — Z0000 Encounter for general adult medical examination without abnormal findings: Secondary | ICD-10-CM | POA: Diagnosis not present

## 2015-08-02 DIAGNOSIS — E785 Hyperlipidemia, unspecified: Secondary | ICD-10-CM

## 2015-08-02 DIAGNOSIS — Z119 Encounter for screening for infectious and parasitic diseases, unspecified: Secondary | ICD-10-CM

## 2015-08-02 DIAGNOSIS — E039 Hypothyroidism, unspecified: Secondary | ICD-10-CM

## 2015-08-02 DIAGNOSIS — I4891 Unspecified atrial fibrillation: Secondary | ICD-10-CM | POA: Diagnosis not present

## 2015-08-02 MED ORDER — LEVOTHYROXINE SODIUM 150 MCG PO TABS: 150.0000 ug | ORAL_TABLET | Freq: Every day | ORAL | Status: DC

## 2015-08-02 MED ORDER — OMEPRAZOLE 20 MG PO CPDR: 20.0000 mg | DELAYED_RELEASE_CAPSULE | Freq: Every day | ORAL | Status: DC

## 2015-08-02 MED ORDER — PROMETHAZINE HCL 25 MG PO TABS: 25.0000 mg | ORAL_TABLET | Freq: Four times a day (QID) | ORAL | Status: DC | PRN

## 2015-08-02 NOTE — Progress Notes (Signed)
Pre visit review using our clinic review tool, if applicable. No additional management support is needed unless otherwise documented below in the visit note.  I will have personally reviewed the Medicare Annual Wellness questionnaire when she drops it off and will have noted 1. The patient's medical and social history- see EMR 2. Their use of alcohol, tobacco or illicit drugs- see EMR 3. Their current medications and supplements- see EMR 4. The patient's functional ability including ADL's, fall risks, home safety risks and hearing or visual             Impairment.  No falls.  No troubles with ADLs.  No safety risk factors at home.  No sig hearing or vision changes/troubles.  She has mild hearing loss and an early cataract.   5. Diet and physical activities- exercising 4 days a week.  Working on diet.  Down 12 lbs with diet and exercise.   6. Evidence for depression or mood disorders- none noted, d/w pt.    The patients weight, height, BMI have been recorded in the chart and visual acuity is per eye clinic.  I have made referrals, counseling and provided education to the patient based review of the above and I have provided the pt with a written personalized care plan for preventive services.  Provider list updated- see scanned forms.  Routine anticipatory guidance given to patient.  See health maintenance.  Flu  Up to date.  Shingles Up to date.  PNA Up to date.  Tetanus Up to date. Colon 2013 Pap not indicated.  Breast cancer screening done 2016 DXA due, d/w pt.   Advance directive- husband designated if patient were incapacitated.   Cognitive function addressed- see scanned forms- and if abnormal then additional documentation follows.  Pt opts in for HCV screening.  D/w pt re: routine screening.   Memory testing wnl, 3/3 recall, A&O, can do math, read watch, etc.   Elevated Cholesterol: Using medications without problems:yes Muscle aches: no aches but she has some occ sensation of  weakness when she is getting up from a chair.  She had attributed it to more weight gain prev.  She can still exert at her gym class w/o troubles.   Diet compliance:yes Exercise:yes  AF.  No heart racing usually.  Controlled with baseline and rare prn meds.  She has had some brief episodic tightness in her chest at rest.  Not with exertion.  She can exert without sx.  She admits to higher stress level recently with family concerns and she thought that was contributing.    Hypothyroid.  TSH wnl.  No ADE on med.  No neck mass or swelling.    PMH and SH reviewed  Meds, vitals, and allergies reviewed.   ROS: See HPI.  Otherwise negative.    GEN: nad, alert and oriented HEENT: mucous membranes moist NECK: supple w/o LA CV: rrr. PULM: ctab, no inc wob ABD: soft, +bs EXT: no edema SKIN: no acute rash  EKG w/o acute changes. D/w pt.

## 2015-08-02 NOTE — Patient Instructions (Addendum)
Drop off or fax your forms.   Take care.  Glad to see you.  Keep working on your weight in the meantime.  If you have more chest symptoms, then let me know.

## 2015-08-02 NOTE — Telephone Encounter (Signed)
*  STAT* If patient is at the pharmacy, call can be transferred to refill team.   1. Which medications need to be refilled? (please list name of each medication and dose if known) rivaroxaban (XARELTO) 20 MG TABS tablet and    2. Which pharmacy/location (including street and city if local pharmacy) is medication to be sent to? [facility]  3. Do they need a 30 day or 90 day supply? 90 day

## 2015-08-03 MED ORDER — RIVAROXABAN 20 MG PO TABS: 20.0000 mg | ORAL_TABLET | Freq: Every day | ORAL | Status: DC

## 2015-08-03 NOTE — Assessment & Plan Note (Signed)
RRR today, continue as is.  The chest tightness sounds to be related to anxiety and with EKG wnl in the absence of exertional sx (and able to exercise at baseline), then I wouldn't intervene.  She'll update me if sx continue.  Still okay for outpatient fu

## 2015-08-03 NOTE — Assessment & Plan Note (Signed)
Controlled, continue as is.  

## 2015-08-03 NOTE — Assessment & Plan Note (Signed)
I don't think she is having statin related sx.  Continue statin as is.  Labs d/w pt.  She agrees.

## 2015-08-03 NOTE — Assessment & Plan Note (Signed)
Flu  Up to date.  Shingles Up to date.  PNA Up to date.  Tetanus Up to date. Colon 2013 Pap not indicated.  Breast cancer screening done 2016 DXA due, d/w pt.   Advance directive- husband designated if patient were incapacitated.   Cognitive function addressed- see scanned forms- and if abnormal then additional documentation follows.  Pt opts in for HCV screening.  D/w pt re: routine screening.   Memory testing wnl, 3/3 recall, A&O, can do math, read watch, etc.

## 2015-08-03 NOTE — Telephone Encounter (Signed)
Refill sent for xarelto  

## 2015-08-03 NOTE — Assessment & Plan Note (Signed)
D/w pt about diet and exercise.   

## 2015-08-05 ENCOUNTER — Other Ambulatory Visit: Payer: Self-pay | Admitting: *Deleted

## 2015-08-05 ENCOUNTER — Telehealth: Payer: Self-pay | Admitting: Cardiovascular Disease

## 2015-08-05 MED ORDER — PROPRANOLOL HCL ER 120 MG PO CP24: 120.0000 mg | ORAL_CAPSULE | Freq: Every day | ORAL | Status: DC

## 2015-08-05 NOTE — Telephone Encounter (Signed)
°*  STAT* If patient is at the pharmacy, call can be transferred to refill team.   1. Which medications need to be refilled? (please list name of each medication and dose if known) Propanolol   2. Which pharmacy/location (including street and city if local pharmacy) is medication to be sent to? [facility]   3. Do they need a 30 day or 90 day supply? 90 day

## 2015-08-05 NOTE — Telephone Encounter (Signed)
Propanolol Rx has been sent to [facility] for 90 day refill.

## 2015-08-30 ENCOUNTER — Telehealth: Payer: Self-pay | Admitting: Family Medicine

## 2015-08-30 NOTE — Telephone Encounter (Signed)
Pt thinks she has a bladder infection and wants to know if she can bring in a sample and have Dr Damita Dunnings call her in a rx. cb number is [phone] Thanks

## 2015-08-30 NOTE — Telephone Encounter (Signed)
Please see if you can add her on at 3 PM today, for OV.  Thanks.

## 2015-08-30 NOTE — Telephone Encounter (Signed)
Pt called back she stated she could not come in today she is going to go to work.  The only day she can come is Thursday am she made appointment @ 9:30 on thursday

## 2015-08-31 NOTE — Telephone Encounter (Signed)
Patient advised.

## 2015-08-31 NOTE — Telephone Encounter (Signed)
Noted. OTC Azo may help in the meantime.  Thanks.

## 2015-09-02 ENCOUNTER — Encounter: Payer: Self-pay | Admitting: Family Medicine

## 2015-09-02 ENCOUNTER — Ambulatory Visit (INDEPENDENT_AMBULATORY_CARE_PROVIDER_SITE_OTHER): Payer: PPO | Admitting: Family Medicine

## 2015-09-02 VITALS — BP 120/72 | HR 64 | Temp 97.8°F | Wt 225.8 lb

## 2015-09-02 DIAGNOSIS — R300 Dysuria: Secondary | ICD-10-CM | POA: Diagnosis not present

## 2015-09-02 LAB — POC URINALSYSI DIPSTICK (AUTOMATED)
Bilirubin, UA: NEGATIVE
Glucose, UA: NEGATIVE
KETONES UA: NEGATIVE
NITRITE UA: NEGATIVE
PH UA: 6
PROTEIN UA: NEGATIVE
Spec Grav, UA: >=1.03
Urobilinogen, UA: >=8

## 2015-09-02 MED ORDER — SULFAMETHOXAZOLE-TRIMETHOPRIM 400-80 MG PO TABS: 1.0000 | ORAL_TABLET | Freq: Two times a day (BID) | ORAL | Status: DC

## 2015-09-02 NOTE — Progress Notes (Signed)
Pre visit review using our clinic review tool, if applicable. No additional management support is needed unless otherwise documented below in the visit note.  Dysuria:yes, urgency, frequency, small incomplete voiding.  duration of symptoms: a few days ago abdominal pain:no Fevers:no known/documented fevers back pain: some occ lower back.  Vomiting:no She just doesn't feell well in general.   Meds, vitals, and allergies reviewed.   ROS: See HPI.  Otherwise negative.    GEN: nad, alert and oriented CV: rrr.  PULM: ctab, no inc wob ABD: soft, +bs, suprapubic area not tender EXT: no edema BACK: no CVA pain

## 2015-09-02 NOTE — Assessment & Plan Note (Signed)
Nontoxic.  Septra, ucx, fluids, prn AZO.  Fu prn.  She agrees.

## 2015-09-02 NOTE — Patient Instructions (Signed)
Drink plenty of water and start the antibiotics today.  We'll contact you with your lab report.  Take care.   

## 2015-09-05 LAB — URINE CULTURE: Colony Count: 50000

## 2015-09-20 DIAGNOSIS — L821 Other seborrheic keratosis: Secondary | ICD-10-CM | POA: Diagnosis not present

## 2015-09-20 DIAGNOSIS — Z08 Encounter for follow-up examination after completed treatment for malignant neoplasm: Secondary | ICD-10-CM | POA: Diagnosis not present

## 2015-09-20 DIAGNOSIS — Z85828 Personal history of other malignant neoplasm of skin: Secondary | ICD-10-CM | POA: Diagnosis not present

## 2015-09-20 DIAGNOSIS — L230 Allergic contact dermatitis due to metals: Secondary | ICD-10-CM | POA: Diagnosis not present

## 2016-01-17 ENCOUNTER — Other Ambulatory Visit: Payer: Self-pay

## 2016-01-17 MED ORDER — ROSUVASTATIN CALCIUM 10 MG PO TABS: 10.0000 mg | ORAL_TABLET | Freq: Every day | ORAL | 3 refills | Status: DC

## 2016-01-18 MED ORDER — ROSUVASTATIN CALCIUM 10 MG PO TABS: 10.0000 mg | ORAL_TABLET | Freq: Every day | ORAL | 3 refills | Status: DC

## 2016-02-08 ENCOUNTER — Encounter: Payer: Self-pay | Admitting: Family Medicine

## 2016-02-08 ENCOUNTER — Ambulatory Visit (INDEPENDENT_AMBULATORY_CARE_PROVIDER_SITE_OTHER): Payer: PPO | Admitting: Family Medicine

## 2016-02-08 VITALS — BP 138/82 | HR 62 | Temp 97.6°F | Wt 222.5 lb

## 2016-02-08 DIAGNOSIS — R350 Frequency of micturition: Secondary | ICD-10-CM | POA: Diagnosis not present

## 2016-02-08 MED ORDER — CIPROFLOXACIN HCL 500 MG PO TABS: 500.0000 mg | ORAL_TABLET | Freq: Two times a day (BID) | ORAL | 0 refills | Status: DC

## 2016-02-08 NOTE — Progress Notes (Signed)
SUBJECTIVE: Kara Wright is a 69 y.o. female pt of Dr. Damita Dunnings, new to me, who complains of urinary frequency, urgency and dysuria x 3 days, without flank pain, fever, chills, or abnormal vaginal discharge or bleeding.   Current Outpatient Prescriptions on File Prior to Visit  Medication Sig Dispense Refill  . cholecalciferol (VITAMIN D) 1000 units tablet Take 2,000 Units by mouth daily.    Marland Kitchen diltiazem (CARDIZEM) 30 MG tablet Take 1 tablet (30 mg total) by mouth 3 (three) times daily as needed. 90 tablet 1  . levothyroxine (SYNTHROID, LEVOTHROID) 150 MCG tablet Take 1 tablet (150 mcg total) by mouth daily. Except for 1.5 on Sunday 100 tablet 3  . omeprazole (PRILOSEC) 20 MG capsule Take 1 capsule (20 mg total) by mouth daily. 90 capsule 3  . promethazine (PHENERGAN) 25 MG tablet Take 1 tablet (25 mg total) by mouth every 6 (six) hours as needed. For nausea 30 tablet 3  . propranolol ER (INDERAL LA) 120 MG 24 hr capsule Take 1 capsule (120 mg total) by mouth daily. 90 capsule 3  . rivaroxaban (XARELTO) 20 MG TABS tablet Take 1 tablet (20 mg total) by mouth daily with supper. 90 tablet 3  . rosuvastatin (CRESTOR) 10 MG tablet Take 1 tablet (10 mg total) by mouth daily. 90 tablet 3   No current facility-administered medications on file prior to visit.     Allergies  Allergen Reactions  . Celecoxib     REACTION: nausea: GI UPSET  . Nickel     Skin rash, allergy  . Simvastatin     Aches but tolerates crestor    Past Medical History:  Diagnosis Date  . Atrial fib/flutter, transient    1 episode, resolved, never on coumadin  . Cancer ([place])    skin cancer  . Diverticulosis 03/1997  . Dysrhythmia    atrial fibrilation  . GERD (gastroesophageal reflux disease) 1997   good on prilosec  . Hepatitis    hx of hep B  . Hyperlipidemia 08/1996  . Hypothyroidism 1984  . Migraine    w/o aura  . Osteopenia 11/2002   via dexa  . Stroke [facility]) 08/2011   TIA    Past Surgical History:   Procedure Laterality Date  . ABDOMINAL HYSTERECTOMY    . ANAL FISSURE REPAIR    . CHOLECYSTECTOMY  07/2006  . 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 nasolabial fold skin flap surgery  08/02/2007   [facility]  . PARTIAL HYSTERECTOMY  1978-79  . POLYPECTOMY  04/06/97   colon, benign by pathology  . varicose vein ablation sclerotherapy  09/2008   Dr. Hulda Humphrey    Family History  Problem Relation Age of Onset  . Hypertension Mother   . Osteopenia Mother   . Stroke Mother     deceased age 53  . Colon cancer Father 60    mets  . Allergies Sister   . Breast cancer Neg Hx     Social History   Social History  . Marital status: Married    Spouse name: N/A  . Number of children: 2  . Years of education: N/A   Occupational History  . Claims adjuster Retired    [place] [employer]  . 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 Smoking status: Former Smoker    Quit date: 06/26/1986  . Smokeless tobacco: Never Used  . Alcohol use No  .  Drug use: No  . Sexual activity: Not Currently   Other Topics Concern  . 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  OBJECTIVE: BP 138/82   Pulse 62   Temp 97.6 F (36.4 C) (Oral)   Wt 222 lb 8 oz (100.9 kg)   SpO2 96%   BMI 37.03 kg/m    Appears well, in no apparent distress.  Vital signs are normal. The abdomen is soft without tenderness, guarding, mass, rebound or organomegaly. No CVA tenderness or inguinal adenopathy noted. Urine dipstick shows unable to perform because she took AZO   ASSESSMENT: UTI uncomplicated without evidence of pyelonephritis  PLAN: Treatment per orders -cipro 500 mg twice daily x 5 days, also push fluids, may use  Pyridium OTC prn. Call or return to clinic prn if these symptoms worsen or fail to improve as anticipated.

## 2016-02-08 NOTE — Progress Notes (Signed)
Pre visit review using our clinic review tool, if applicable. No additional management support is needed unless otherwise documented below in the visit note. 

## 2016-02-08 NOTE — Patient Instructions (Signed)

## 2016-02-10 LAB — URINE CULTURE

## 2016-02-11 ENCOUNTER — Telehealth: Payer: Self-pay | Admitting: Cardiovascular Disease

## 2016-02-11 NOTE — Telephone Encounter (Signed)
Please call patient to discuss results of not taking this medication for several days.  Patient did not notice she was out of meds and will be leaving to go out of state for a week.   [facility] rx is not affordable.  Please call.

## 2016-02-11 NOTE — Telephone Encounter (Signed)
Patient is out of xarelto and going out of state.    Patient calling the office for samples of medication:   1.  What medication and dosage are you requesting samples for? xarelto 20 mg po daily   2.  Are you currently out of this medication? Yes Sister will stop by office

## 2016-02-11 NOTE — Telephone Encounter (Signed)
Pt calling asking what kinds of pain medication can she take She is on Xarelto and not sure what can and can't she take  Please advise

## 2016-02-11 NOTE — Telephone Encounter (Signed)
Spoke w/ pt.   She has been having some gum pain recently and would like to know what she can take for the pain.  Due to ins reasons, she does not have dentist at the moment.  Advised her to take some Tylenol, but if pain persists more than a day or two, to have it evaluated. She is agreeable and will call back if we can be of further assistance.

## 2016-02-11 NOTE — Telephone Encounter (Signed)
Pt has short runs of afib at a time. She is out of Xarelto, cannot afford rx and we do not have samples. Any suggestions before she leaves town?

## 2016-02-11 NOTE — Telephone Encounter (Signed)
We do not have any Xarelto 20 mg tablets available.  We will contact Drug rep for samples.

## 2016-02-13 NOTE — Telephone Encounter (Signed)
Only other option would be to change her to warfarin  Might be cheaper Would ask her if she would like to change over

## 2016-02-14 NOTE — Telephone Encounter (Signed)
Spoke w/ pt.    She reports that she was given 10 pills before they left to go to [place]. She will discuss her options w/ her husband and call when they get back in town.

## 2016-02-23 ENCOUNTER — Telehealth: Payer: Self-pay | Admitting: Cardiovascular Disease

## 2016-02-23 NOTE — Telephone Encounter (Signed)
Pt is due for 1 yr f/u. Sched to see Dr. Rockey Situ 04/06/16 to discuss meds.

## 2016-02-23 NOTE — Telephone Encounter (Signed)
Pt states she needs an alternative to Xarelto, due to she cannot afford this. Please call and advise

## 2016-02-24 DIAGNOSIS — M722 Plantar fascial fibromatosis: Secondary | ICD-10-CM | POA: Diagnosis not present

## 2016-02-24 DIAGNOSIS — M79672 Pain in left foot: Secondary | ICD-10-CM | POA: Diagnosis not present

## 2016-02-24 DIAGNOSIS — M7731 Calcaneal spur, right foot: Secondary | ICD-10-CM | POA: Diagnosis not present

## 2016-02-24 DIAGNOSIS — M79671 Pain in right foot: Secondary | ICD-10-CM | POA: Diagnosis not present

## 2016-03-16 DIAGNOSIS — M722 Plantar fascial fibromatosis: Secondary | ICD-10-CM | POA: Diagnosis not present

## 2016-03-17 ENCOUNTER — Ambulatory Visit: Payer: PPO | Admitting: Family Medicine

## 2016-03-17 ENCOUNTER — Encounter: Payer: Self-pay | Admitting: Family Medicine

## 2016-03-17 ENCOUNTER — Ambulatory Visit (INDEPENDENT_AMBULATORY_CARE_PROVIDER_SITE_OTHER): Payer: PPO | Admitting: Family Medicine

## 2016-03-17 VITALS — BP 134/84 | HR 64 | Temp 98.2°F | Wt 222.2 lb

## 2016-03-17 DIAGNOSIS — N3090 Cystitis, unspecified without hematuria: Secondary | ICD-10-CM | POA: Diagnosis not present

## 2016-03-17 DIAGNOSIS — R350 Frequency of micturition: Secondary | ICD-10-CM | POA: Diagnosis not present

## 2016-03-17 LAB — POC URINALSYSI DIPSTICK (AUTOMATED)
Bilirubin, UA: NEGATIVE
Glucose, UA: NEGATIVE
Ketones, UA: NEGATIVE
NITRITE UA: NEGATIVE
UROBILINOGEN UA: 1
pH, UA: 6

## 2016-03-17 MED ORDER — CEPHALEXIN 500 MG PO CAPS: 500.0000 mg | ORAL_CAPSULE | Freq: Three times a day (TID) | ORAL | 0 refills | Status: DC

## 2016-03-17 MED ORDER — PHENAZOPYRIDINE HCL 97.5 MG PO TABS: 97.5000 mg | ORAL_TABLET | Freq: Three times a day (TID) | ORAL | 0 refills | Status: DC | PRN

## 2016-03-17 NOTE — Progress Notes (Signed)
Pre visit review using our clinic review tool, if applicable. No additional management support is needed unless otherwise documented below in the visit note. 

## 2016-03-17 NOTE — Patient Instructions (Signed)

## 2016-03-17 NOTE — Progress Notes (Signed)
Subjective:    Patient ID: Kara Wright, female    DOB: Aug 16, 1945, 70 y.o.   MRN: [id number]  HPI This is a 70 yo female who presents with 2 days of urinary frequency, burning. Has had several UTIs in last 6 months. No fever/chills, no nausea/vomiting/abominal pain, no back pain. Just feels bad all over. Admits to not drinking much water due to not being able get up to void frequently at work. She had a stomach bug with diarrhea 1-2 weeks ago.   Past Medical History:  Diagnosis Date  . Atrial fib/flutter, transient    1 episode, resolved, never on coumadin  . Cancer ([place])    skin cancer  . Diverticulosis 03/1997  . Dysrhythmia    atrial fibrilation  . GERD (gastroesophageal reflux disease) 1997   good on prilosec  . Hepatitis    hx of hep B  . Hyperlipidemia 08/1996  . Hypothyroidism 1984  . Migraine    w/o aura  . Osteopenia 11/2002   via dexa  . Stroke [facility]) 08/2011   TIA   Past Surgical History:  Procedure Laterality Date  . ABDOMINAL HYSTERECTOMY    . ANAL FISSURE REPAIR    . CHOLECYSTECTOMY  07/2006  . 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 nasolabial fold skin flap surgery  08/02/2007   [facility]  . PARTIAL HYSTERECTOMY  1978-79  . POLYPECTOMY  04/06/97   colon, benign by pathology  . varicose vein ablation sclerotherapy  09/2008   Dr. Hulda Humphrey   Family History  Problem Relation Age of Onset  . Hypertension Mother   . Osteopenia Mother   . Stroke Mother     deceased age 26  . Colon cancer Father 60    mets  . Allergies Sister   . Breast cancer Neg Hx    Social History  Substance Use Topics  . Smoking status: Former Smoker    Quit date: 06/26/1986  . Smokeless tobacco: Never Used  . Alcohol use No      Review of Systems Per HPI    Objective:   Physical Exam  Constitutional: She is oriented to person, place, and time. She appears well-developed and well-nourished. No distress.  HENT:  Head: Normocephalic and  atraumatic.  Eyes: Conjunctivae are normal.  Cardiovascular: Normal rate, regular rhythm and normal heart sounds.   Pulmonary/Chest: Effort normal and breath sounds normal.  Abdominal: Soft. She exhibits no distension. There is no tenderness. There is no rebound, no guarding and no CVA tenderness.  Musculoskeletal: She exhibits no edema.  Neurological: She is alert and oriented to person, place, and time.  Skin: Skin is warm and dry. She is not diaphoretic.  Psychiatric: She has a normal mood and affect. Her behavior is normal. Judgment and thought content normal.  Vitals reviewed.     BP 134/84   Pulse 64   Temp 98.2 F (36.8 C) (Oral)   Wt 222 lb 4 oz (100.8 kg)   BMI 36.98 kg/m  Wt Readings from Last 3 Encounters:  03/17/16 222 lb 4 oz (100.8 kg)  02/08/16 222 lb 8 oz (100.9 kg)  09/02/15 225 lb 12 oz (102.4 kg)   Results for orders placed or performed in visit on 03/17/16  POCT Urinalysis Dipstick (Automated)  Result Value Ref Range   Color, UA Brown    Clarity, UA Cloudy    Glucose, UA Negative    Bilirubin, UA Negative  Ketones, UA Negative    Spec Grav, UA >=1.030    Blood, UA 3+    pH, UA 6.0    Protein, UA 1+    Urobilinogen, UA 1.0    Nitrite, UA Negative    Leukocytes, UA large (3+) (A) Negative        Assessment & Plan:  1. Frequency of urination - POCT Urinalysis Dipstick (Automated)  2. Cystitis - Provided written and verbal information regarding diagnosis and treatment. - cephALEXin (KEFLEX) 500 MG capsule; Take 1 capsule (500 mg total) by mouth 3 (three) times daily.  Dispense: 21 capsule; Refill: 0 - Phenazopyridine HCl (AZO URINARY PAIN) 97.5 MG TABS; Take 1 tablet (97.5 mg total) by mouth 3 (three) times daily as needed.  Dispense: 24 each; Refill: 0 - Urine culture - RTC precautions reviewed  Clarene Reamer, FNP-BC  [facility]  03/17/2016 2:12 PM

## 2016-03-20 LAB — URINE CULTURE

## 2016-03-30 ENCOUNTER — Telehealth: Payer: Self-pay | Admitting: Cardiovascular Disease

## 2016-03-30 NOTE — Telephone Encounter (Signed)
Patient calling the office for samples of medication:   1.  What medication and dosage are you requesting samples for? Xarelto 25 mg  2.  Are you currently out of this medication?  Just paid 154 for this month  But is in donut hole  Would like to have some for next month.  Please advise

## 2016-03-30 NOTE — Telephone Encounter (Signed)
Xarelto 20 mg tablet samples placed at front desk for pick up. 

## 2016-04-06 ENCOUNTER — Encounter: Payer: Self-pay | Admitting: Cardiovascular Disease

## 2016-04-06 ENCOUNTER — Ambulatory Visit (INDEPENDENT_AMBULATORY_CARE_PROVIDER_SITE_OTHER): Payer: PPO | Admitting: Cardiovascular Disease

## 2016-04-06 VITALS — BP 128/78 | HR 55 | Ht 65.0 in | Wt 225.5 lb

## 2016-04-06 DIAGNOSIS — E7800 Pure hypercholesterolemia, unspecified: Secondary | ICD-10-CM

## 2016-04-06 DIAGNOSIS — I10 Essential (primary) hypertension: Secondary | ICD-10-CM

## 2016-04-06 DIAGNOSIS — I480 Paroxysmal atrial fibrillation: Secondary | ICD-10-CM | POA: Diagnosis not present

## 2016-04-06 NOTE — Progress Notes (Signed)
Cardiology Office Note  Date:  04/06/2016   ID:  Kara Wright, Kara Wright 1945/08/10, MRN [id number]  PCP:  Elsie Stain, MD   Chief Complaint  Patient presents with  . other    1 yr f/u c/o right jaw pain. Meds reviewed verbally with pt.    HPI:  Kara Wright is a very pleasant 70 year old woman with morbid obesity, hypothyroidism, remote smoking history for less than 10 years, prior TIA,CVA.  History of atrial fibrillation seen on Holter monitor in 2013 She presents today for follow-up of her paroxysmal atrial fibrillation  In follow-up today she has had 15-20 pound weight loss over the past year Recent UTI Then had URI, still recovering  She has had a Few short spells of atrial fib,  about 10 min per episode  In the donut hole, xarelto $150, requesting samples  Rarely takes diltiazem  Leg cramps at night, etiology unclear    No regular exercise program Tolerating anticoagulation No TIA or stroke symptoms  EKG on today's visit shows normal sinus rhythm with rate 55 bpm, no significant ST or T-wave changes  Other past medical history  February 22nd 2016 she had 30 minutes of tachycardia. She is concerned this could have been atrial fibrillation   08/25/2011,  right eye vision changes, right arm weakness and numbness lasting for 30-40 minutes. Symptoms seemed to resolve. The next day, on Saturday, she had some mouth and facial numbness and she went to the emergency room. She also had headache. They did a EKG and CT scan of the head that showed no stroke. Early in the week, she continued to have headache and mouth issues and she was taken to [facility]. She had MRA that showed small vessel disease, carotid ultrasound that showed mild plaque. She reports that she was in the hospital for 3 days. No atrial fibrillation was seen.   Echocardiogram was essentially normal. Prior episodes of tachycardia lasting less than 1 minute on her last clinic visit   Previous Holter monitor  that showed short periods of supraventricular tachycardia, rare APCs, long stretches of atrial fibrillation. She did not have any symptoms while wearing the monitor  PMH:   has a past medical history of Atrial fib/flutter, transient; Cancer ([place]); Diverticulosis (03/1997); Dysrhythmia; GERD (gastroesophageal reflux disease) (1997); Hepatitis; Hyperlipidemia (08/1996); Hypothyroidism (1984); Migraine; Osteopenia (11/2002); and Stroke [facility]) (08/2011).  PSH:    Past Surgical History:  Procedure Laterality Date  . ABDOMINAL HYSTERECTOMY    . ANAL FISSURE REPAIR    . CHOLECYSTECTOMY  07/2006  . 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 nasolabial fold skin flap surgery  08/02/2007   [facility]  . PARTIAL HYSTERECTOMY  1978-79  . POLYPECTOMY  04/06/97   colon, benign by pathology  . varicose vein ablation sclerotherapy  09/2008   Dr. Hulda Humphrey    Current Outpatient Prescriptions  Medication Sig Dispense Refill  . cephALEXin (KEFLEX) 500 MG capsule Take 1 capsule (500 mg total) by mouth 3 (three) times daily. 21 capsule 0  . cholecalciferol (VITAMIN D) 1000 units tablet Take 2,000 Units by mouth 2 (two) times daily.     Marland Kitchen diltiazem (CARDIZEM) 30 MG tablet Take 1 tablet (30 mg total) by mouth 3 (three) times daily as needed. 90 tablet 1  . levothyroxine (SYNTHROID, LEVOTHROID) 150 MCG tablet Take 1 tablet (150 mcg total) by mouth daily. Except for 1.5 on Sunday 100 tablet 3  . omeprazole (PRILOSEC) 20 MG  capsule Take 1 capsule (20 mg total) by mouth daily. 90 capsule 3  . Phenazopyridine HCl (AZO URINARY PAIN) 97.5 MG TABS Take 1 tablet (97.5 mg total) by mouth 3 (three) times daily as needed. 24 each 0  . promethazine (PHENERGAN) 25 MG tablet Take 1 tablet (25 mg total) by mouth every 6 (six) hours as needed. For nausea 30 tablet 3  . propranolol ER (INDERAL LA) 120 MG 24 hr capsule Take 1 capsule (120 mg total) by mouth daily. 90 capsule 3  . rivaroxaban (XARELTO) 20 MG TABS  tablet Take 1 tablet (20 mg total) by mouth daily with supper. 90 tablet 3  . rosuvastatin (CRESTOR) 10 MG tablet Take 1 tablet (10 mg total) by mouth daily. 90 tablet 3   No current facility-administered medications for this visit.      Allergies:   Celecoxib; Nickel; and Simvastatin   Social History:  The patient  reports that she quit smoking about 29 years ago. She has never used smokeless tobacco. She reports that she does not drink alcohol or use drugs.   Family History:   family history includes Allergies in her sister; Colon cancer (age of onset: 20) in her father; Hypertension in her mother; Osteopenia in her mother; Stroke in her mother.    Review of Systems: Review of Systems  Constitutional: Negative.   Respiratory: Negative.   Cardiovascular: Negative.   Gastrointestinal: Negative.   Musculoskeletal: Negative.   Neurological: Negative.   Psychiatric/Behavioral: Negative.   All other systems reviewed and are negative.    PHYSICAL EXAM: VS:  BP 128/78 (BP Location: Left Wrist, Patient Position: Sitting, Cuff Size: Normal)   Pulse (!) 55   Ht 5\' 5"  (1.651 m)   Wt 225 lb 8 oz (102.3 kg)   BMI 37.53 kg/m  , BMI Body mass index is 37.53 kg/m. GEN: Well nourished, well developed, in no acute distress, obese  HEENT: normal  Neck: no JVD, carotid bruits, or masses Cardiac: RRR; no murmurs, rubs, or gallops,no edema  Respiratory:  clear to auscultation bilaterally, normal work of breathing GI: soft, nontender, nondistended, + BS MS: no deformity or atrophy  Skin: warm and dry, no rash Neuro:  Strength and sensation are intact Psych: euthymic mood, full affect    Recent Labs: 07/26/2015: ALT 15; BUN 17; Creatinine, Ser 0.74; Potassium 4.2; Sodium 139; TSH 0.35    Lipid Panel Lab Results  Component Value Date   CHOL 116 07/26/2015   HDL 38.80 (L) 07/26/2015   LDLCALC 43 07/26/2015   TRIG 172.0 (H) 07/26/2015      Wt Readings from Last 3 Encounters:   04/06/16 225 lb 8 oz (102.3 kg)  03/17/16 222 lb 4 oz (100.8 kg)  02/08/16 222 lb 8 oz (100.9 kg)       ASSESSMENT AND PLAN:  Pure hypercholesterolemia Cholesterol is at goal on the current lipid regimen. No changes to the medications were made.  Paroxysmal atrial fibrillation (HCC) Very rare short episodes of atrial fibrillation No changes to her medications at this time Recommended she take diltiazem as needed for breakthrough arrhythmia and call our office if she starts to have more frequent episodes  Essential hypertension Blood pressure is well controlled on today's visit. No changes made to the medications.  Morbid obesity ([place])  complemented her on recent weight loss   Total encounter time more than 15 minutes  Greater than 50% was spent in counseling and coordination of care with the patient  Disposition:   F/U  12 months  No orders of the defined types were placed in this encounter.    Signed, Esmond Plants, M.D., Ph.D. 04/06/2016  [place], [place]

## 2016-04-06 NOTE — Patient Instructions (Signed)
Medication Instructions:   No medication changes made  Try CoEnZ Q10 for cramps If no improvement, Consider decreasing the crestor down to every other day  Labwork:  No new labs needed  Testing/Procedures:  No further testing at this time   Follow-Up: It was a pleasure seeing you in the office today. Please call us if you have new issues that need to be addressed before your next appt.  [phone]  Your physician wants you to follow-up in: 12 months.  You will receive a reminder letter in the mail two months in advance. If you don't receive a letter, please call our office to schedule the follow-up appointment.  If you need a refill on your cardiac medications before your next appointment, please call your pharmacy.

## 2016-04-11 NOTE — Addendum Note (Signed)
Addended by: Britt Bottom on: 04/11/2016 03:06 PM   Modules accepted: Orders

## 2016-05-01 ENCOUNTER — Telehealth: Payer: Self-pay | Admitting: Family Medicine

## 2016-05-01 DIAGNOSIS — N3000 Acute cystitis without hematuria: Secondary | ICD-10-CM

## 2016-05-01 NOTE — Telephone Encounter (Signed)
Left a message per DPR that she can come by the office and do a urine for a urine culture

## 2016-05-01 NOTE — Telephone Encounter (Signed)
Order placed. Thanks.

## 2016-05-01 NOTE — Telephone Encounter (Signed)
Pt called to follow up from 9/29 office visit with Tor Netters. She was told to give sample at cardiologists ck up to see progress but was told by the office they could not do that there. She is requesting orders to ck her urine. Her lower back hurts in the morning and wants to be sure it is cleared up.

## 2016-05-08 ENCOUNTER — Ambulatory Visit: Payer: PPO

## 2016-05-08 NOTE — Telephone Encounter (Signed)
Patient brought in urine specimen in a Vitamin D container that she said she had washed out. Terri ([employer] manager) gave patient a couple of sterile cups.  Dr. Damita Dunnings says it has to be in a sterile container.  Patient advised to bring or come in for a urine specimen using the sterile container.

## 2016-06-05 ENCOUNTER — Ambulatory Visit (INDEPENDENT_AMBULATORY_CARE_PROVIDER_SITE_OTHER): Payer: PPO

## 2016-06-05 DIAGNOSIS — Z23 Encounter for immunization: Secondary | ICD-10-CM | POA: Diagnosis not present

## 2016-06-08 ENCOUNTER — Telehealth: Payer: Self-pay | Admitting: Cardiovascular Disease

## 2016-06-08 ENCOUNTER — Other Ambulatory Visit (INDEPENDENT_AMBULATORY_CARE_PROVIDER_SITE_OTHER): Payer: PPO

## 2016-06-08 DIAGNOSIS — N3000 Acute cystitis without hematuria: Secondary | ICD-10-CM | POA: Diagnosis not present

## 2016-06-08 NOTE — Telephone Encounter (Signed)
Pt calling stating she has a bruise on her right leg on calf almost behind her knee She is on xarelto 25 mg once a day And knows if she was to get any usual bruising to call us She noticed it this morning. It is not bothering her but it is alarming to her  Would like some advise on this

## 2016-06-08 NOTE — Telephone Encounter (Signed)
S/w patient. Patient stated her bruise is behind calf on right leg. About the size of a silver dollar, bluish in color with a ring of yellow around it. Denies swelling, redness, warmth, or pain. She doesn't remember hitting her self there anytime. Advised patient to continue on Xarelto 20 mg daily and to call us back it the area gets worse and not better. She verbalized understanding.

## 2016-06-09 LAB — URINE CULTURE: ORGANISM ID, BACTERIA: NO GROWTH

## 2016-06-22 ENCOUNTER — Telehealth: Payer: Self-pay

## 2016-06-22 NOTE — Telephone Encounter (Signed)
Left detailed message on voicemail (DPR) as instructed.

## 2016-06-22 NOTE — Telephone Encounter (Signed)
Likely a scam.  I didn't order it.  Thanks.

## 2016-06-22 NOTE — Telephone Encounter (Signed)
Pt received a phone call from a pain management clinic in [place] offering a back brace to help with her back pian, they said they're associated with medicare, she said she has only told you about her back pain and she doesn't want a brace unless you say its okay. She said she doesn't know how they got her number/information.

## 2016-06-24 ENCOUNTER — Other Ambulatory Visit: Payer: Self-pay | Admitting: Cardiovascular Disease

## 2016-07-17 ENCOUNTER — Other Ambulatory Visit: Payer: Self-pay | Admitting: Family Medicine

## 2016-07-17 DIAGNOSIS — R739 Hyperglycemia, unspecified: Secondary | ICD-10-CM

## 2016-07-17 DIAGNOSIS — I480 Paroxysmal atrial fibrillation: Secondary | ICD-10-CM

## 2016-07-17 DIAGNOSIS — M8580 Other specified disorders of bone density and structure, unspecified site: Secondary | ICD-10-CM

## 2016-07-27 ENCOUNTER — Other Ambulatory Visit (INDEPENDENT_AMBULATORY_CARE_PROVIDER_SITE_OTHER): Payer: PPO

## 2016-07-27 DIAGNOSIS — I480 Paroxysmal atrial fibrillation: Secondary | ICD-10-CM

## 2016-07-27 DIAGNOSIS — M8580 Other specified disorders of bone density and structure, unspecified site: Secondary | ICD-10-CM

## 2016-07-27 DIAGNOSIS — Z119 Encounter for screening for infectious and parasitic diseases, unspecified: Secondary | ICD-10-CM

## 2016-07-27 DIAGNOSIS — R739 Hyperglycemia, unspecified: Secondary | ICD-10-CM | POA: Diagnosis not present

## 2016-07-27 LAB — CBC WITH DIFFERENTIAL/PLATELET
Basophils Absolute: 0 K/uL (ref 0.0–0.1)
Basophils Relative: 0.7 % (ref 0.0–3.0)
Eosinophils Absolute: 0.2 K/uL (ref 0.0–0.7)
Eosinophils Relative: 3.3 % (ref 0.0–5.0)
HCT: 41.9 % (ref 36.0–46.0)
Hemoglobin: 13.9 g/dL (ref 12.0–15.0)
Lymphocytes Relative: 31.5 % (ref 12.0–46.0)
Lymphs Abs: 1.9 K/uL (ref 0.7–4.0)
MCHC: 33.2 g/dL (ref 30.0–36.0)
MCV: 84.1 fl (ref 78.0–100.0)
Monocytes Absolute: 0.4 K/uL (ref 0.1–1.0)
Monocytes Relative: 7.5 % (ref 3.0–12.0)
Neutro Abs: 3.4 K/uL (ref 1.4–7.7)
Neutrophils Relative %: 57 % (ref 43.0–77.0)
Platelets: 174 K/uL (ref 150.0–400.0)
RBC: 4.98 Mil/uL (ref 3.87–5.11)
RDW: 13.6 % (ref 11.5–15.5)
WBC: 5.9 K/uL (ref 4.0–10.5)

## 2016-07-27 LAB — COMPREHENSIVE METABOLIC PANEL
ALBUMIN: 3.9 g/dL (ref 3.5–5.2)
ALT: 13 U/L (ref 0–35)
AST: 15 U/L (ref 0–37)
Alkaline Phosphatase: 67 U/L (ref 39–117)
BUN: 9 mg/dL (ref 6–23)
CHLORIDE: 105 meq/L (ref 96–112)
CO2: 34 meq/L — AB (ref 19–32)
CREATININE: 0.68 mg/dL (ref 0.40–1.20)
Calcium: 8.9 mg/dL (ref 8.4–10.5)
GFR: 90.92 mL/min (ref >60.00)
GLUCOSE: 120 mg/dL — AB (ref 70–99)
Potassium: 4.3 mEq/L (ref 3.5–5.1)
SODIUM: 141 meq/L (ref 135–145)
Total Bilirubin: 0.5 mg/dL (ref 0.2–1.2)
Total Protein: 6.6 g/dL (ref 6.0–8.3)

## 2016-07-27 LAB — LIPID PANEL
CHOL/HDL RATIO: 3
Cholesterol: 119 mg/dL (ref 0–200)
HDL: 40.8 mg/dL (ref >39.00)
LDL CALC: 42 mg/dL (ref 0–99)
NONHDL: 78.15
Triglycerides: 181 mg/dL — ABNORMAL HIGH (ref 0.0–149.0)
VLDL: 36.2 mg/dL (ref 0.0–40.0)

## 2016-07-27 LAB — HEMOGLOBIN A1C: HEMOGLOBIN A1C: 5.3 % (ref 4.6–6.5)

## 2016-07-27 LAB — TSH: TSH: 0.49 u[IU]/mL (ref 0.35–4.50)

## 2016-07-27 LAB — VITAMIN D 25 HYDROXY (VIT D DEFICIENCY, FRACTURES): VITD: 19.17 ng/mL — ABNORMAL LOW (ref 30.00–100.00)

## 2016-07-27 NOTE — Addendum Note (Signed)
Addended by: Ellamae Sia on: 07/27/2016 11:13 AM   Modules accepted: Orders

## 2016-07-28 LAB — HEPATITIS C ANTIBODY: HCV Ab: NEGATIVE

## 2016-08-03 ENCOUNTER — Ambulatory Visit (INDEPENDENT_AMBULATORY_CARE_PROVIDER_SITE_OTHER): Payer: PPO | Admitting: Family Medicine

## 2016-08-03 ENCOUNTER — Ambulatory Visit: Payer: PPO

## 2016-08-03 ENCOUNTER — Encounter: Payer: Self-pay | Admitting: Family Medicine

## 2016-08-03 VITALS — BP 122/70 | HR 75 | Temp 98.1°F | Ht 64.25 in | Wt 234.8 lb

## 2016-08-03 DIAGNOSIS — J3489 Other specified disorders of nose and nasal sinuses: Secondary | ICD-10-CM

## 2016-08-03 DIAGNOSIS — M8580 Other specified disorders of bone density and structure, unspecified site: Secondary | ICD-10-CM | POA: Diagnosis not present

## 2016-08-03 DIAGNOSIS — E7800 Pure hypercholesterolemia, unspecified: Secondary | ICD-10-CM | POA: Diagnosis not present

## 2016-08-03 DIAGNOSIS — M545 Low back pain: Secondary | ICD-10-CM

## 2016-08-03 DIAGNOSIS — K219 Gastro-esophageal reflux disease without esophagitis: Secondary | ICD-10-CM

## 2016-08-03 DIAGNOSIS — R739 Hyperglycemia, unspecified: Secondary | ICD-10-CM | POA: Diagnosis not present

## 2016-08-03 DIAGNOSIS — Z0000 Encounter for general adult medical examination without abnormal findings: Secondary | ICD-10-CM

## 2016-08-03 DIAGNOSIS — E559 Vitamin D deficiency, unspecified: Secondary | ICD-10-CM

## 2016-08-03 DIAGNOSIS — E039 Hypothyroidism, unspecified: Secondary | ICD-10-CM | POA: Diagnosis not present

## 2016-08-03 DIAGNOSIS — I480 Paroxysmal atrial fibrillation: Secondary | ICD-10-CM

## 2016-08-03 MED ORDER — LORATADINE 10 MG PO TABS: 10.0000 mg | ORAL_TABLET | Freq: Every day | ORAL | Status: DC | PRN

## 2016-08-03 MED ORDER — OMEPRAZOLE 20 MG PO CPDR: 20.0000 mg | DELAYED_RELEASE_CAPSULE | Freq: Every day | ORAL | 3 refills | Status: DC

## 2016-08-03 MED ORDER — VITAMIN D (ERGOCALCIFEROL) 1.25 MG (50000 UNIT) PO CAPS: 50000.0000 [IU] | ORAL_CAPSULE | ORAL | 1 refills | Status: DC

## 2016-08-03 MED ORDER — PROMETHAZINE HCL 25 MG PO TABS: 25.0000 mg | ORAL_TABLET | Freq: Four times a day (QID) | ORAL | 3 refills | Status: DC | PRN

## 2016-08-03 MED ORDER — LEVOTHYROXINE SODIUM 150 MCG PO TABS: 150.0000 ug | ORAL_TABLET | Freq: Every day | ORAL | 3 refills | Status: DC

## 2016-08-03 NOTE — Progress Notes (Signed)
PCP notes:   Health maintenance:  No gaps identified.  Abnormal screenings:   Hearing - failed  Patient concerns:   Pt reports concern with sore throat. Pain scale: 1/10. Onset: 08/03/16. Pt states cough is productive. Pt observed brownish discharge. Pt encouraged to drink fluids.   Nurse concerns:  None  Next PCP appt:   08/03/2016  I reviewed health advisor's note, was available for consultation on the day of service listed in this note, and agree with documentation and plan. Elsie Stain, MD.

## 2016-08-03 NOTE — Patient Instructions (Signed)
[name] , Thank you for taking time to come for your Medicare Wellness Visit. I appreciate your ongoing commitment to your health goals. Please review the following plan we discussed and let me know if I can assist you in the future.   These are the goals we discussed: Goals    . Increase physical activity          Starting 08/03/16, I will continue to exercise at least 60 min 2 days per week.        This is a list of the screening recommended for you and due dates:  Health Maintenance  Topic Date Due  . Mammogram  10/11/2016  . Colon Cancer Screening  04/09/2017  . Tetanus Vaccine  02/19/2022  . Flu Shot  Completed  . DEXA scan (bone density measurement)  Completed  . Shingles Vaccine  Completed  .  Hepatitis C: One time screening is recommended by Center for Disease Control  (CDC) for  adults born from [birth year] through 1964.   Completed  . Pneumonia vaccines  Completed   Preventive Care for Adults  A healthy lifestyle and preventive care can promote health and wellness. Preventive health guidelines for adults include the following key practices.  . A routine yearly physical is a good way to check with your health care provider about your health and preventive screening. It is a chance to share any concerns and updates on your health and to receive a thorough exam.  . Visit your dentist for a routine exam and preventive care every 6 months. Brush your teeth twice a day and floss once a day. Good oral hygiene prevents tooth decay and gum disease.  . The frequency of eye exams is based on your age, health, family medical history, use  of contact lenses, and other factors. Follow your health care provider's ecommendations for frequency of eye exams.  . Eat a healthy diet. Foods like vegetables, fruits, whole grains, low-fat dairy products, and lean protein foods contain the nutrients you need without too many calories. Decrease your intake of foods high in solid fats, added sugars,  and salt. Eat the right amount of calories for you. Get information about a proper diet from your health care provider, if necessary.  . Regular physical exercise is one of the most important things you can do for your health. Most adults should get at least 150 minutes of moderate-intensity exercise (any activity that increases your heart rate and causes you to sweat) each week. In addition, most adults need muscle-strengthening exercises on 2 or more days a week.  Silver Sneakers may be a benefit available to you. To determine eligibility, you may visit the website: www.silversneakers.com or contact program at 909-234-2862 Mon-Fri between 8AM-8PM.   . Maintain a healthy weight. The body mass index (BMI) is a screening tool to identify possible weight problems. It provides an estimate of body fat based on height and weight. Your health care provider can find your BMI and can help you achieve or maintain a healthy weight.   For adults 20 years and older: ? A BMI below 18.5 is considered underweight. ? A BMI of 18.5 to 24.9 is normal. ? A BMI of 25 to 29.9 is considered overweight. ? A BMI of 30 and above is considered obese.   . Maintain normal blood lipids and cholesterol levels by exercising and minimizing your intake of saturated fat. Eat a balanced diet with plenty of fruit and vegetables. Blood tests for lipids and  cholesterol should begin at age 34 and be repeated every 5 years. If your lipid or cholesterol levels are high, you are over 50, or you are at high risk for heart disease, you may need your cholesterol levels checked more frequently. Ongoing high lipid and cholesterol levels should be treated with medicines if diet and exercise are not working.  . If you smoke, find out from your health care provider how to quit. If you do not use tobacco, please do not start.  . If you choose to drink alcohol, please do not consume more than 2 drinks per day. One drink is considered to be 12  ounces (355 mL) of beer, 5 ounces (148 mL) of wine, or 1.5 ounces (44 mL) of liquor.  . If you are 34-32 years old, ask your health care provider if you should take aspirin to prevent strokes.  . Use sunscreen. Apply sunscreen liberally and repeatedly throughout the day. You should seek shade when your shadow is shorter than you. Protect yourself by wearing long sleeves, pants, a wide-brimmed hat, and sunglasses year round, whenever you are outdoors.  . Once a month, do a whole body skin exam, using a mirror to look at the skin on your back. Tell your health care provider of new moles, moles that have irregular borders, moles that are larger than a pencil eraser, or moles that have changed in shape or color.

## 2016-08-03 NOTE — Progress Notes (Signed)
Pre visit review using our clinic review tool, if applicable. No additional management support is needed unless otherwise documented below in the visit note. 

## 2016-08-03 NOTE — Progress Notes (Signed)
Subjective:   Kara Wright is a 71 y.o. female who presents for Medicare Annual (Subsequent) preventive examination.  Review of Systems:  N/A Cardiac Risk Factors include: advanced age (>1men, >84 women);obesity (BMI >30kg/m2);dyslipidemia;hypertension     Objective:     Vitals: BP 122/70 (BP Location: Right Arm, Patient Position: Sitting, Cuff Size: Large)   Pulse 75   Temp 98.1 F (36.7 C) (Oral)   Ht 5' 4.25" (1.632 m) Comment: no shoes  Wt 234 lb 12 oz (106.5 kg)   SpO2 96%   BMI 39.98 kg/m   Body mass index is 39.98 kg/m.   Tobacco History  Smoking Status  . Former Smoker  . Quit date: 06/26/1986  Smokeless Tobacco  . Never Used     Counseling given: No   Past Medical History:  Diagnosis Date  . Atrial fib/flutter, transient    1 episode, resolved, never on coumadin  . Cancer ([place])    skin cancer  . Diverticulosis 03/1997  . Dysrhythmia    atrial fibrilation  . GERD (gastroesophageal reflux disease) 1997   good on prilosec  . Hepatitis    hx of hep B  . Hyperlipidemia 08/1996  . Hypothyroidism 1984  . Migraine    w/o aura  . Osteopenia 11/2002   via dexa  . Stroke [facility]) 08/2011   TIA   Past Surgical History:  Procedure Laterality Date  . ABDOMINAL HYSTERECTOMY    . ANAL FISSURE REPAIR    . CHOLECYSTECTOMY  07/2006  . 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 nasolabial fold skin flap surgery  08/02/2007   [facility]  . PARTIAL HYSTERECTOMY  1978-79  . POLYPECTOMY  04/06/97   colon, benign by pathology  . varicose vein ablation sclerotherapy  09/2008   Dr. Hulda Humphrey   Family History  Problem Relation Age of Onset  . Hypertension Mother   . Osteopenia Mother   . Stroke Mother     deceased age 35  . Colon cancer Father 60    mets  . Allergies Sister   . Breast cancer Neg Hx    History  Sexual Activity  . Sexual activity: Not Currently    Outpatient Encounter Prescriptions as of 08/03/2016  Medication Sig    . cholecalciferol (VITAMIN D) 1000 units tablet Take 2,000 Units by mouth 2 (two) times daily.   Marland Kitchen diltiazem (CARDIZEM) 30 MG tablet Take 1 tablet (30 mg total) by mouth 3 (three) times daily as needed.  Marland Kitchen levothyroxine (SYNTHROID, LEVOTHROID) 150 MCG tablet Take 1 tablet (150 mcg total) by mouth daily. Except for 1.5 on Sunday  . omeprazole (PRILOSEC) 20 MG capsule Take 1 capsule (20 mg total) by mouth daily.  . promethazine (PHENERGAN) 25 MG tablet Take 1 tablet (25 mg total) by mouth every 6 (six) hours as needed. For nausea  . propranolol ER (INDERAL LA) 120 MG 24 hr capsule Take 1 capsule (120 mg total) by mouth daily.  . rosuvastatin (CRESTOR) 10 MG tablet Take 1 tablet (10 mg total) by mouth daily.  Alveda Reasons 20 MG TABS tablet TAKE ONE (1) TABLET BY MOUTH EVERY DAY WITH SUPPER  . [DISCONTINUED] cephALEXin (KEFLEX) 500 MG capsule Take 1 capsule (500 mg total) by mouth 3 (three) times daily.  . [DISCONTINUED] Phenazopyridine HCl (AZO URINARY PAIN) 97.5 MG TABS Take 1 tablet (97.5 mg total) by mouth 3 (three) times daily as needed.   No facility-administered encounter medications on  file as of 08/03/2016.     Activities of Daily Living In your present state of health, do you have any difficulty performing the following activities: 08/03/2016  Hearing? Y  Vision? N  Difficulty concentrating or making decisions? N  Walking or climbing stairs? Y  Dressing or bathing? N  Doing errands, shopping? N  Preparing Food and eating ? N  Using the Toilet? N  In the past six months, have you accidently leaked urine? N  Do you have problems with loss of bowel control? N  Managing your Medications? N  Managing your Finances? N  Housekeeping or managing your Housekeeping? N  Some recent data might be hidden    Patient Care Team: Tonia Ghent, MD as PCP - General (Family Medicine)    Assessment:     Hearing Screening   125Hz  250Hz  500Hz  1000Hz  2000Hz  3000Hz  4000Hz  6000Hz  8000Hz   Right  ear:   40 40 40  0    Left ear:   40 40 40  0    Vision Screening Comments: Last vision exam in Nov 2017 with Dr. Wyatt Portela   Exercise Activities and Dietary recommendations Current Exercise Habits: Structured exercise class, Type of exercise: Other - see comments (jazzercise), Time (Minutes): 60, Frequency (Times/Week): 2, Weekly Exercise (Minutes/Week): 120, Intensity: Moderate, Exercise limited by: None identified  Goals    . Increase physical activity          Starting 08/03/16, I will continue to exercise at least 60 min 2 days per week.       Fall Risk Fall Risk  08/03/2016 07/16/2012  Falls in the past year? No No   Depression Screen PHQ 2/9 Scores 08/03/2016 07/16/2012  PHQ - 2 Score 0 2     Cognitive Function MMSE - Mini Mental State Exam 08/03/2016  Orientation to time 5  Orientation to Place 5  Registration 3  Attention/ Calculation 0  Recall 3  Language- name 2 objects 0  Language- repeat 1  Language- follow 3 step command 3  Language- read & follow direction 0  Write a sentence 0  Copy design 0  Total score 20       PLEASE NOTE: A Mini-Cog screen was completed. Maximum score is 20. A value of 0 denotes this part of [place] MMSE was not completed or the patient failed this part of the Mini-Cog screening.   Mini-Cog Screening Orientation to Time - Max 5 pts Orientation to Place - Max 5 pts Registration - Max 3 pts Recall - Max 3 pts Language Repeat - Max 1 pts Language Follow 3 Step Command - Max 3 pts   Immunization History  Administered Date(s) Administered  . Influenza Split 04/13/2011, 04/26/2012  . Influenza Whole 04/07/2009, 04/13/2010  . Influenza,inj,Quad PF,36+ Mos 05/01/2013, 04/30/2014, 04/05/2015, 06/05/2016  . Pneumococcal Conjugate-13 08/06/2014  . Pneumococcal Polysaccharide-23 07/16/2012  . Td 04/04/2006, 02/20/2012  . Zoster 05/02/2007   Screening Tests Health Maintenance  Topic Date Due  . MAMMOGRAM  10/11/2016  . COLONOSCOPY   04/09/2017  . TETANUS/TDAP  02/19/2022  . INFLUENZA VACCINE  Completed  . DEXA SCAN  Completed  . ZOSTAVAX  Completed  . Hepatitis C Screening  Completed  . PNA vac Low Risk Adult  Completed      Plan:     I have personally reviewed and addressed the Medicare Annual Wellness questionnaire and have noted the following in the patient's chart:  A. Medical and social history B. Use of alcohol, tobacco  or illicit drugs  C. Current medications and supplements D. Functional ability and status E.  Nutritional status F.  Physical activity G. Advance directives H. List of other physicians I.  Hospitalizations, surgeries, and ER visits in previous 12 months J.  [place] to include hearing, vision, cognitive, depression L. Referrals and appointments - none  In addition, I have reviewed and discussed with patient certain preventive protocols, quality metrics, and best practice recommendations. A written personalized care plan for preventive services as well as general preventive health recommendations were provided to patient.  See attached scanned questionnaire for additional information.   Signed,   Lindell Noe, MHA, BS, LPN Health Coach

## 2016-08-03 NOTE — Patient Instructions (Addendum)
Recheck vit D in about 3 months.  Higher dose in the meantime.  Try plain claritin, not claritin D.  Use the eat right diet.   Take care.  Glad to see you.  Rosaria Ferries will call about your referral, ask for DXA and mammogram together. Rest and fluids in the meantime, likely a cold.

## 2016-08-03 NOTE — Progress Notes (Signed)
Hypothyroidism.  Compliant. TSH at goal.  No neck mass.  No dysphagia.    Vit D def.  Labs d/w pt. DXA pending.    Hyperglycemia.  Labs d/w pt.  Diet d/w pt.    GERD controlled with PPI. No ADE on med.  Some nausea controlled with prn phenergan.    Recently with ST.  Some rhinorrhea.  Sx started this AM.  No fevers.  She has chronic rhinorrhea and some itching at baseline.  Takes benadryl and uses lotion at baseline, with some help.  D/w pt about claritin trial, but not claritin D.  Some cough and congestion, more in the AM.    She has some lower back pain in the AM before she gets out of bed, better as the day goes on.    AF.  occ fluttering but no persistent racing.  Rare use of CCB, prn use, with relief when needed.  Still on xarelto.  She is worried about bleeding, d/w pt about risk and benefits.  She has some occ bleeding with brushing her teeth.  No catastrophic bleeds.  Still on BB at baseline.  No CP, not lightheaded.    Elevated Cholesterol: Using medications without problems:yes Muscle aches: some weakness occ noted in the L leg but this is not always consistent and not escalating over the last few years.  No pain.   Diet compliance: encouraged Exercise: encouraged  Hearing - failed. D/w pt.  Declined hearing aids.    PMH and SH reviewed  ROS: Per HPI unless specifically indicated in ROS section   Meds, vitals, and allergies reviewed.   GEN: nad, alert and oriented HEENT: mucous membranes moist, tm w/o erythema, nasal exam w/o erythema, clear discharge noted,  OP with cobblestoning NECK: supple w/o LA CV: rrr.   PULM: ctab, no inc wob EXT: no edema

## 2016-08-04 DIAGNOSIS — M545 Low back pain, unspecified: Secondary | ICD-10-CM | POA: Insufficient documentation

## 2016-08-04 DIAGNOSIS — J3489 Other specified disorders of nose and nasal sinuses: Secondary | ICD-10-CM | POA: Insufficient documentation

## 2016-08-04 NOTE — Assessment & Plan Note (Signed)
TSH at goal. No thyromegaly. Continue as is. She agrees.

## 2016-08-04 NOTE — Assessment & Plan Note (Addendum)
Discussed with patient about diet and exercise for weight loss.

## 2016-08-04 NOTE — Assessment & Plan Note (Signed)
Nontoxic. Okay to try Claritin. Follow-up as needed.

## 2016-08-04 NOTE — Assessment & Plan Note (Signed)
Risk versus benefit of medications discussed with patient. Very likely the case the benefit greatly outweighs risk currently. Continue as is. Discussed with patient about diet and nectar size.

## 2016-08-04 NOTE — Assessment & Plan Note (Signed)
Discussed with patient about diet and exercise.  Labs discussed with patient. 

## 2016-08-04 NOTE — Assessment & Plan Note (Signed)
Repeat bone density pending. Low vitamin D noted. Increased replacement. Recheck in 3 months. She agrees.

## 2016-08-04 NOTE — Assessment & Plan Note (Signed)
Generally controlled with PPI. Some occasional use of Phenergan for nausea. Continue as is. Discussed with patient about diet and exercise and weight loss.

## 2016-08-04 NOTE — Assessment & Plan Note (Signed)
Noted only in the morning before she gets out of bed. This may be mattress related. She can rotate her mattress and then see if that improves.

## 2016-08-04 NOTE — Assessment & Plan Note (Signed)
Continue statin for now. If muscle weakness or muscle aches kit worse then she will let me know.

## 2016-08-17 ENCOUNTER — Other Ambulatory Visit: Payer: Self-pay | Admitting: Cardiovascular Disease

## 2016-09-18 DIAGNOSIS — L820 Inflamed seborrheic keratosis: Secondary | ICD-10-CM | POA: Diagnosis not present

## 2016-09-18 DIAGNOSIS — X32XXXA Exposure to sunlight, initial encounter: Secondary | ICD-10-CM | POA: Diagnosis not present

## 2016-09-18 DIAGNOSIS — Z08 Encounter for follow-up examination after completed treatment for malignant neoplasm: Secondary | ICD-10-CM | POA: Diagnosis not present

## 2016-09-18 DIAGNOSIS — Z85828 Personal history of other malignant neoplasm of skin: Secondary | ICD-10-CM | POA: Diagnosis not present

## 2016-09-18 DIAGNOSIS — L239 Allergic contact dermatitis, unspecified cause: Secondary | ICD-10-CM | POA: Diagnosis not present

## 2016-09-18 DIAGNOSIS — L570 Actinic keratosis: Secondary | ICD-10-CM | POA: Diagnosis not present

## 2016-09-18 DIAGNOSIS — D485 Neoplasm of uncertain behavior of skin: Secondary | ICD-10-CM | POA: Diagnosis not present

## 2016-09-21 ENCOUNTER — Telehealth: Payer: Self-pay | Admitting: Family Medicine

## 2016-09-21 ENCOUNTER — Encounter: Payer: Self-pay | Admitting: Family Medicine

## 2016-09-21 NOTE — Telephone Encounter (Signed)
Opened in error

## 2016-09-26 ENCOUNTER — Other Ambulatory Visit: Payer: Self-pay | Admitting: Family Medicine

## 2016-09-26 DIAGNOSIS — Z1231 Encounter for screening mammogram for malignant neoplasm of breast: Secondary | ICD-10-CM

## 2016-10-02 DIAGNOSIS — L239 Allergic contact dermatitis, unspecified cause: Secondary | ICD-10-CM | POA: Diagnosis not present

## 2016-10-04 DIAGNOSIS — L239 Allergic contact dermatitis, unspecified cause: Secondary | ICD-10-CM | POA: Diagnosis not present

## 2016-10-06 DIAGNOSIS — L239 Allergic contact dermatitis, unspecified cause: Secondary | ICD-10-CM | POA: Diagnosis not present

## 2016-10-16 ENCOUNTER — Encounter: Payer: Self-pay | Admitting: Family Medicine

## 2016-10-16 ENCOUNTER — Ambulatory Visit
Admission: RE | Admit: 2016-10-16 | Discharge: 2016-10-16 | Disposition: A | Discharge status: Home / Self Care | Payer: PPO | Source: Ambulatory Visit | Attending: Family Medicine | Admitting: Family Medicine

## 2016-10-16 DIAGNOSIS — Z780 Asymptomatic menopausal state: Secondary | ICD-10-CM | POA: Diagnosis not present

## 2016-10-16 DIAGNOSIS — M8580 Other specified disorders of bone density and structure, unspecified site: Secondary | ICD-10-CM | POA: Diagnosis not present

## 2016-10-16 DIAGNOSIS — Z1231 Encounter for screening mammogram for malignant neoplasm of breast: Secondary | ICD-10-CM | POA: Insufficient documentation

## 2016-10-16 DIAGNOSIS — M810 Age-related osteoporosis without current pathological fracture: Secondary | ICD-10-CM | POA: Diagnosis not present

## 2016-10-21 ENCOUNTER — Ambulatory Visit
Admission: EM | Admit: 2016-10-21 | Discharge: 2016-10-21 | Disposition: A | Discharge status: Home / Self Care | Payer: PPO | Attending: Family Medicine | Admitting: Family Medicine

## 2016-10-21 ENCOUNTER — Encounter: Payer: Self-pay | Admitting: Emergency Medicine

## 2016-10-21 DIAGNOSIS — R05 Cough: Secondary | ICD-10-CM

## 2016-10-21 DIAGNOSIS — J988 Other specified respiratory disorders: Secondary | ICD-10-CM

## 2016-10-21 MED ORDER — HYDROCOD POLST-CPM POLST ER 10-8 MG/5ML PO SUER: 5.0000 mL | Freq: Two times a day (BID) | ORAL | 0 refills | Status: DC | PRN

## 2016-10-21 MED ORDER — DOXYCYCLINE HYCLATE 100 MG PO CAPS
100.0000 mg | ORAL_CAPSULE | Freq: Two times a day (BID) | ORAL | 0 refills | Status: DC
Start: 2016-10-21 — End: 2016-10-29

## 2016-10-21 NOTE — ED Triage Notes (Signed)
Cough, congestion, nasal drainage for 1 month, has gotten worse the last 3 days

## 2016-10-21 NOTE — Discharge Instructions (Signed)
Take the antibiotic twice daily with food.  I can make you photosensitive. Wear sunscreen and stay out of direct sun.  See her PCP if he felt improve or worsen.  Take care  Dr. Lacinda Axon

## 2016-10-21 NOTE — ED Provider Notes (Signed)
[facility]    CSN: [id number] Arrival date & time: 10/21/16  0818  History   Chief Complaint Chief Complaint  Patient presents with  . Cough   HPI 71 year old female presents with respiration symptoms.  Patient states she's been sick for the past month. She states her symptoms initially began with itchy watery eyes, sneezing, rhinorrhea, diarrhea, and sore throat. The symptoms have improved and she has now plagued by congestion, cough, fatigue, and associated weakness. No fevers or chills. No shortness of breath. She's been taking over-the-counter medications without improvement. No other associated symptoms. She is quite bothered by the cough which is severe particularly at night. No other complaints or concerns at this time.  Past Medical History:  Diagnosis Date  . Atrial fib/flutter, transient    1 episode, resolved, never on coumadin  . Cancer ([place])    skin cancer  . Diverticulosis 03/1997  . Dysrhythmia    atrial fibrilation  . GERD (gastroesophageal reflux disease) 1997   good on prilosec  . Hepatitis    hx of hep B  . Hyperlipidemia 08/1996  . Hypothyroidism 1984  . Migraine    w/o aura  . Osteopenia 11/2002   via dexa  . Stroke [facility]) 08/2011   TIA    Patient Active Problem List   Diagnosis Date Noted  . Low back pain 08/04/2016  . Dysuria 09/02/2015  . Morbid obesity ([place]) 04/05/2015  . Advance care planning 08/07/2014  . Hematuria, gross 04/24/2014  . Hyperglycemia 07/16/2012  . Anxiety 07/16/2012  . Esophagitis 04/17/2012  . 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 TIA (transient ischemic attack) 08/27/2011  . Medicare annual wellness visit, initial 06/25/2011  . Atrial fibrillation ([place]) 10/31/2010  . HTN (hypertension) 10/31/2010  . Obesity 10/31/2010  . 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 Hypothyroidism 12/15/2006  . HLD (hyperlipidemia) 12/15/2006  . MIGRAINE HEADACHE 12/15/2006  . GLAUCOMA 12/15/2006  . GERD (gastroesophageal  reflux disease) 12/15/2006  . DIVERTICULOSIS, COLON 12/15/2006  . Osteoporosis 12/15/2006    Past Surgical History:  Procedure Laterality Date  . ABDOMINAL HYSTERECTOMY    . ANAL FISSURE REPAIR    . CHOLECYSTECTOMY  07/2006  . 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 nasolabial fold skin flap surgery  08/02/2007   [facility]  . PARTIAL HYSTERECTOMY  1978-79  . POLYPECTOMY  04/06/97   colon, benign by pathology  . varicose vein ablation sclerotherapy  09/2008   Dr. Hulda Humphrey    OB History    No data available       Home Medications    Prior to Admission medications   Medication Sig Start Date End Date Taking? Authorizing Provider  diltiazem (CARDIZEM) 30 MG tablet Take 1 tablet (30 mg total) by mouth 3 (three) times daily as needed. 04/05/15  Yes Gollan, Kathlene November, MD  levothyroxine (SYNTHROID, LEVOTHROID) 150 MCG tablet Take 1 tablet (150 mcg total) by mouth daily. Except for 1.5 on Sunday 08/03/16  Yes Tonia Ghent, MD  loratadine (CLARITIN) 10 MG tablet Take 1 tablet (10 mg total) by mouth daily as needed for rhinitis or itching. 08/03/16  Yes Tonia Ghent, MD  omeprazole (PRILOSEC) 20 MG capsule Take 1 capsule (20 mg total) by mouth daily. 08/03/16  Yes Tonia Ghent, MD  promethazine (PHENERGAN) 25 MG tablet Take 1 tablet (25 mg total) by mouth every 6 (six) hours as needed. For nausea 08/03/16  Yes Tonia Ghent, MD  propranolol ER (INDERAL LA) 120 MG 24 hr capsule TAKE ONE CAPSULE BY MOUTH DAILY 08/17/16  Yes Gollan, Kathlene November, MD  rosuvastatin (CRESTOR) 10 MG tablet Take 1 tablet (10 mg total) by mouth daily. 01/18/16  Yes Gollan, Kathlene November, MD  Vitamin D, Ergocalciferol, (DRISDOL) 50000 units CAPS capsule Take 1 capsule (50,000 Units total) by mouth every 7 (seven) days. 08/03/16  Yes Tonia Ghent, MD  XARELTO 20 MG TABS tablet TAKE ONE (1) TABLET BY MOUTH EVERY DAY WITH SUPPER 06/26/16  Yes Gollan, Kathlene November, MD    chlorpheniramine-HYDROcodone (TUSSIONEX PENNKINETIC ER) 10-8 MG/5ML SUER Take 5 mLs by mouth every 12 (twelve) hours as needed. 10/21/16   Coral Spikes, DO  doxycycline (VIBRAMYCIN) 100 MG capsule Take 1 capsule (100 mg total) by mouth 2 (two) times daily. 10/21/16   Coral Spikes, DO    Family History Family History  Problem Relation Age of Onset  . Hypertension Mother   . Osteopenia Mother   . Stroke Mother     deceased age 49  . Colon cancer Father 60    mets  . Allergies Sister   . Breast cancer Cousin     pat cousin    Social History Social History  Substance Use Topics  . Smoking status: Former Smoker    Quit date: 06/26/1986  . Smokeless tobacco: Never Used  . Alcohol use No     Allergies   Celecoxib; Nickel; and Simvastatin   Review of Systems Review of Systems  Constitutional: Positive for fatigue.  HENT: Positive for congestion.   Respiratory: Positive for cough.   Neurological: Positive for weakness.   Physical Exam Triage Vital Signs ED Triage Vitals  Enc Vitals Group     BP 10/21/16 0853 (!) 154/66     Pulse Rate 10/21/16 0853 70     Resp 10/21/16 0853 16     Temp 10/21/16 0853 99.3 F (37.4 C)     Temp Source 10/21/16 0853 Tympanic     SpO2 10/21/16 0853 96 %     Weight 10/21/16 0855 236 lb (107 kg)     Height 10/21/16 0855 5\' 5"  (1.651 m)     Head Circumference --      Peak Flow --      Pain Score --      Pain Loc --      Pain Edu? --      Excl. in [place]? --     Updated Vital Signs BP (!) 154/66 (BP Location: Left Arm)   Pulse 70   Temp 99.3 F (37.4 C) (Tympanic)   Resp 16   Ht 5\' 5"  (1.651 m)   Wt 236 lb (107 kg)   SpO2 96%   BMI 39.27 kg/m  Physical Exam  Constitutional: She is oriented to person, place, and time. She appears well-developed. No distress.  HENT:  Head: Normocephalic and atraumatic.  Mouth/Throat: Oropharynx is clear and moist.  TM's without evidence of infection.  Eyes: Conjunctivae are normal.  Neck: Neck  supple.  Cardiovascular: Normal rate and regular rhythm.   Pulmonary/Chest: Effort normal and breath sounds normal.  Abdominal: Soft. She exhibits no distension.  Musculoskeletal: Normal range of motion.  Lymphadenopathy:    She has no cervical adenopathy.  Neurological: She is alert and oriented to person, place, and time.  Skin: No rash noted. No erythema.  Psychiatric: She has a normal mood and affect.  Vitals reviewed.  UC Treatments / Results  Labs (  all labs ordered are listed, but only abnormal results are displayed) Labs Reviewed - No data to display  EKG  EKG Interpretation None      Radiology No results found.  Procedures Procedures (including critical care time)  Medications Ordered in UC Medications - No data to display   Initial Impression / Assessment and Plan / UC Course  I have reviewed the triage vital signs and the nursing notes.  Pertinent labs & imaging results that were available during my care of the patient were reviewed by me and considered in my medical decision making (see chart for details).   70 year old female presents with evidence of a respiratory infection. Given the duration of her illness, I am treating her empirically with doxycycline. Tussionex for cough.  Final Clinical Impressions(s) / UC Diagnoses   Final diagnoses:  Respiratory infection   New Prescriptions New Prescriptions   CHLORPHENIRAMINE-HYDROCODONE (TUSSIONEX PENNKINETIC ER) 10-8 MG/5ML SUER    Take 5 mLs by mouth every 12 (twelve) hours as needed.   DOXYCYCLINE (VIBRAMYCIN) 100 MG CAPSULE    Take 1 capsule (100 mg total) by mouth 2 (two) times daily.     Thersa [place], [place] 10/21/16 [phone]

## 2016-10-26 ENCOUNTER — Encounter (HOSPITAL_COMMUNITY): Payer: Self-pay | Admitting: *Deleted

## 2016-10-26 ENCOUNTER — Emergency Department (HOSPITAL_COMMUNITY): Payer: PPO

## 2016-10-26 ENCOUNTER — Ambulatory Visit: Payer: PPO | Admitting: Family Medicine

## 2016-10-26 ENCOUNTER — Inpatient Hospital Stay (HOSPITAL_COMMUNITY): Payer: PPO

## 2016-10-26 ENCOUNTER — Telehealth: Payer: Self-pay | Admitting: Family Medicine

## 2016-10-26 ENCOUNTER — Inpatient Hospital Stay (HOSPITAL_COMMUNITY)
Admission: EM | Admit: 2016-10-26 | Discharge: 2016-10-29 | DRG: 193 | Disposition: A | Discharge status: Home / Self Care | Payer: PPO | Attending: Internal Medicine | Admitting: Internal Medicine

## 2016-10-26 DIAGNOSIS — J9601 Acute respiratory failure with hypoxia: Secondary | ICD-10-CM | POA: Diagnosis not present

## 2016-10-26 DIAGNOSIS — I10 Essential (primary) hypertension: Secondary | ICD-10-CM | POA: Diagnosis present

## 2016-10-26 DIAGNOSIS — Z8673 Personal history of transient ischemic attack (TIA), and cerebral infarction without residual deficits: Secondary | ICD-10-CM

## 2016-10-26 DIAGNOSIS — B964 Proteus (mirabilis) (morganii) as the cause of diseases classified elsewhere: Secondary | ICD-10-CM | POA: Diagnosis present

## 2016-10-26 DIAGNOSIS — K219 Gastro-esophageal reflux disease without esophagitis: Secondary | ICD-10-CM | POA: Diagnosis present

## 2016-10-26 DIAGNOSIS — E785 Hyperlipidemia, unspecified: Secondary | ICD-10-CM | POA: Diagnosis not present

## 2016-10-26 DIAGNOSIS — Z79899 Other long term (current) drug therapy: Secondary | ICD-10-CM | POA: Diagnosis not present

## 2016-10-26 DIAGNOSIS — N390 Urinary tract infection, site not specified: Secondary | ICD-10-CM | POA: Diagnosis not present

## 2016-10-26 DIAGNOSIS — I340 Nonrheumatic mitral (valve) insufficiency: Secondary | ICD-10-CM | POA: Diagnosis not present

## 2016-10-26 DIAGNOSIS — E039 Hypothyroidism, unspecified: Secondary | ICD-10-CM | POA: Diagnosis not present

## 2016-10-26 DIAGNOSIS — I481 Persistent atrial fibrillation: Secondary | ICD-10-CM | POA: Diagnosis not present

## 2016-10-26 DIAGNOSIS — I480 Paroxysmal atrial fibrillation: Secondary | ICD-10-CM | POA: Diagnosis present

## 2016-10-26 DIAGNOSIS — R918 Other nonspecific abnormal finding of lung field: Secondary | ICD-10-CM | POA: Diagnosis not present

## 2016-10-26 DIAGNOSIS — J441 Chronic obstructive pulmonary disease with (acute) exacerbation: Secondary | ICD-10-CM | POA: Diagnosis not present

## 2016-10-26 DIAGNOSIS — R739 Hyperglycemia, unspecified: Secondary | ICD-10-CM | POA: Diagnosis present

## 2016-10-26 DIAGNOSIS — I4891 Unspecified atrial fibrillation: Secondary | ICD-10-CM | POA: Diagnosis present

## 2016-10-26 DIAGNOSIS — Z85828 Personal history of other malignant neoplasm of skin: Secondary | ICD-10-CM | POA: Diagnosis not present

## 2016-10-26 DIAGNOSIS — J440 Chronic obstructive pulmonary disease with acute lower respiratory infection: Secondary | ICD-10-CM | POA: Diagnosis not present

## 2016-10-26 DIAGNOSIS — Z888 Allergy status to other drugs, medicaments and biological substances status: Secondary | ICD-10-CM | POA: Diagnosis not present

## 2016-10-26 DIAGNOSIS — J180 Bronchopneumonia, unspecified organism: Principal | ICD-10-CM | POA: Diagnosis present

## 2016-10-26 DIAGNOSIS — R0602 Shortness of breath: Secondary | ICD-10-CM

## 2016-10-26 DIAGNOSIS — N3000 Acute cystitis without hematuria: Secondary | ICD-10-CM | POA: Diagnosis not present

## 2016-10-26 DIAGNOSIS — R05 Cough: Secondary | ICD-10-CM | POA: Diagnosis not present

## 2016-10-26 DIAGNOSIS — J449 Chronic obstructive pulmonary disease, unspecified: Secondary | ICD-10-CM | POA: Diagnosis not present

## 2016-10-26 DIAGNOSIS — R911 Solitary pulmonary nodule: Secondary | ICD-10-CM | POA: Diagnosis not present

## 2016-10-26 LAB — BASIC METABOLIC PANEL
Anion gap: 10 (ref 5–15)
BUN: 12 mg/dL (ref 6–20)
CALCIUM: 9 mg/dL (ref 8.9–10.3)
CO2: 29 mmol/L (ref 22–32)
Chloride: 97 mmol/L — ABNORMAL LOW (ref 101–111)
Creatinine, Ser: 0.75 mg/dL (ref 0.44–1.00)
GFR calc Af Amer: >60 mL/min (ref >60)
GLUCOSE: 119 mg/dL — AB (ref 65–99)
POTASSIUM: 4 mmol/L (ref 3.5–5.1)
SODIUM: 136 mmol/L (ref 135–145)

## 2016-10-26 LAB — URINALYSIS, ROUTINE W REFLEX MICROSCOPIC
Glucose, UA: NEGATIVE mg/dL
Ketones, ur: NEGATIVE mg/dL
Nitrite: POSITIVE — AB
PROTEIN: 30 mg/dL — AB
SPECIFIC GRAVITY, URINE: 1.024 (ref 1.005–1.030)
pH: 6 (ref 5.0–8.0)

## 2016-10-26 LAB — CBC
HEMATOCRIT: 43.5 % (ref 36.0–46.0)
Hemoglobin: 14.2 g/dL (ref 12.0–15.0)
MCH: 27.6 pg (ref 26.0–34.0)
MCHC: 32.6 g/dL (ref 30.0–36.0)
MCV: 84.5 fL (ref 78.0–100.0)
PLATELETS: 147 10*3/uL — AB (ref 150–400)
RBC: 5.15 MIL/uL — ABNORMAL HIGH (ref 3.87–5.11)
RDW: 14.2 % (ref 11.5–15.5)
WBC: 6 10*3/uL (ref 4.0–10.5)

## 2016-10-26 LAB — TROPONIN I: Troponin I: <0.03 ng/mL (ref <0.03)

## 2016-10-26 LAB — HEPATIC FUNCTION PANEL
ALBUMIN: 3.8 g/dL (ref 3.5–5.0)
ALT: 18 U/L (ref 14–54)
AST: 25 U/L (ref 15–41)
Alkaline Phosphatase: 74 U/L (ref 38–126)
BILIRUBIN TOTAL: 1.2 mg/dL (ref 0.3–1.2)
Bilirubin, Direct: 0.2 mg/dL (ref 0.1–0.5)
Indirect Bilirubin: 1 mg/dL — ABNORMAL HIGH (ref 0.3–0.9)
TOTAL PROTEIN: 7 g/dL (ref 6.5–8.1)

## 2016-10-26 LAB — D-DIMER, QUANTITATIVE (NOT AT ARMC): D DIMER QUANT: 0.41 ug{FEU}/mL (ref 0.00–0.50)

## 2016-10-26 LAB — BRAIN NATRIURETIC PEPTIDE: B NATRIURETIC PEPTIDE 5: 60 pg/mL (ref 0.0–100.0)

## 2016-10-26 LAB — TSH: TSH: 1.687 u[IU]/mL (ref 0.350–4.500)

## 2016-10-26 LAB — I-STAT TROPONIN, ED: TROPONIN I, POC: 0 ng/mL (ref 0.00–0.08)

## 2016-10-26 MED ORDER — SODIUM CHLORIDE 0.9 % IV SOLN
INTRAVENOUS | Status: DC
  Administered 2016-10-26 – 2016-10-27 (×2): via INTRAVENOUS

## 2016-10-26 MED ORDER — METHYLPREDNISOLONE SODIUM SUCC 125 MG IJ SOLR
60.0000 mg | Freq: Two times a day (BID) | INTRAMUSCULAR | Status: DC
  Administered 2016-10-26: 60 mg via INTRAVENOUS
  Filled 2016-10-26: qty 2

## 2016-10-26 MED ORDER — DILTIAZEM HCL 30 MG PO TABS
30.0000 mg | ORAL_TABLET | Freq: Three times a day (TID) | ORAL | Status: DC
  Filled 2016-10-26: qty 1

## 2016-10-26 MED ORDER — ALBUTEROL SULFATE (2.5 MG/3ML) 0.083% IN NEBU
5.0000 mg | INHALATION_SOLUTION | Freq: Once | RESPIRATORY_TRACT | Status: AC
  Administered 2016-10-26: 5 mg via RESPIRATORY_TRACT
  Filled 2016-10-26: qty 6

## 2016-10-26 MED ORDER — LEVALBUTEROL HCL 0.63 MG/3ML IN NEBU
0.6300 mg | INHALATION_SOLUTION | Freq: Three times a day (TID) | RESPIRATORY_TRACT | Status: DC
  Administered 2016-10-27 – 2016-10-28 (×6): 0.63 mg via RESPIRATORY_TRACT
  Filled 2016-10-26 (×7): qty 3

## 2016-10-26 MED ORDER — ONDANSETRON HCL 4 MG/2ML IJ SOLN: 4.0000 mg | Freq: Four times a day (QID) | INTRAMUSCULAR | Status: DC | PRN

## 2016-10-26 MED ORDER — METHYLPREDNISOLONE SODIUM SUCC 125 MG IJ SOLR
125.0000 mg | Freq: Once | INTRAMUSCULAR | Status: AC
  Administered 2016-10-26: 125 mg via INTRAVENOUS

## 2016-10-26 MED ORDER — PROMETHAZINE HCL 25 MG PO TABS: 25.0000 mg | ORAL_TABLET | Freq: Four times a day (QID) | ORAL | Status: DC | PRN

## 2016-10-26 MED ORDER — PROPRANOLOL HCL ER 120 MG PO CP24
120.0000 mg | ORAL_CAPSULE | Freq: Every day | ORAL | Status: DC
  Administered 2016-10-26 – 2016-10-29 (×4): 120 mg via ORAL
  Filled 2016-10-26 (×4): qty 1

## 2016-10-26 MED ORDER — LEVOFLOXACIN IN D5W 500 MG/100ML IV SOLN
500.0000 mg | Freq: Once | INTRAVENOUS | Status: AC
  Administered 2016-10-26: 500 mg via INTRAVENOUS
  Filled 2016-10-26: qty 100

## 2016-10-26 MED ORDER — ROSUVASTATIN CALCIUM 10 MG PO TABS
10.0000 mg | ORAL_TABLET | Freq: Every day | ORAL | Status: DC
  Administered 2016-10-26 – 2016-10-29 (×4): 10 mg via ORAL
  Filled 2016-10-26 (×5): qty 1

## 2016-10-26 MED ORDER — PANTOPRAZOLE SODIUM 40 MG PO TBEC
40.0000 mg | DELAYED_RELEASE_TABLET | Freq: Every day | ORAL | Status: DC
  Administered 2016-10-27 – 2016-10-29 (×3): 40 mg via ORAL
  Filled 2016-10-26 (×3): qty 1

## 2016-10-26 MED ORDER — LEVOFLOXACIN IN D5W 750 MG/150ML IV SOLN: 750.0000 mg | INTRAVENOUS | Status: DC

## 2016-10-26 MED ORDER — ACETAMINOPHEN 650 MG RE SUPP: 650.0000 mg | Freq: Four times a day (QID) | RECTAL | Status: DC | PRN

## 2016-10-26 MED ORDER — ONDANSETRON HCL 4 MG PO TABS: 4.0000 mg | ORAL_TABLET | Freq: Four times a day (QID) | ORAL | Status: DC | PRN

## 2016-10-26 MED ORDER — METHYLPREDNISOLONE SODIUM SUCC 125 MG IJ SOLR
125.0000 mg | Freq: Once | INTRAMUSCULAR | Status: DC
  Filled 2016-10-26: qty 2

## 2016-10-26 MED ORDER — RIVAROXABAN 20 MG PO TABS
20.0000 mg | ORAL_TABLET | Freq: Every day | ORAL | Status: DC
  Administered 2016-10-26 – 2016-10-28 (×3): 20 mg via ORAL
  Filled 2016-10-26 (×3): qty 1

## 2016-10-26 MED ORDER — LEVOTHYROXINE SODIUM 75 MCG PO TABS
225.0000 ug | ORAL_TABLET | ORAL | Status: DC
  Administered 2016-10-29: 225 ug via ORAL
  Filled 2016-10-26: qty 3

## 2016-10-26 MED ORDER — HYDROCOD POLST-CPM POLST ER 10-8 MG/5ML PO SUER
5.0000 mL | Freq: Two times a day (BID) | ORAL | Status: DC | PRN
  Administered 2016-10-28: 5 mL via ORAL
  Filled 2016-10-26: qty 5

## 2016-10-26 MED ORDER — ALBUTEROL (5 MG/ML) CONTINUOUS INHALATION SOLN
10.0000 mg/h | INHALATION_SOLUTION | RESPIRATORY_TRACT | Status: DC
  Administered 2016-10-26: 10 mg/h via RESPIRATORY_TRACT
  Filled 2016-10-26: qty 20

## 2016-10-26 MED ORDER — LEVOTHYROXINE SODIUM 75 MCG PO TABS: 150.0000 ug | ORAL_TABLET | Freq: Every day | ORAL | Status: DC

## 2016-10-26 MED ORDER — LEVALBUTEROL HCL 0.63 MG/3ML IN NEBU
0.6300 mg | INHALATION_SOLUTION | Freq: Four times a day (QID) | RESPIRATORY_TRACT | Status: DC
  Administered 2016-10-26: 0.63 mg via RESPIRATORY_TRACT
  Filled 2016-10-26: qty 3

## 2016-10-26 MED ORDER — LEVOTHYROXINE SODIUM 75 MCG PO TABS
150.0000 ug | ORAL_TABLET | ORAL | Status: DC
  Administered 2016-10-27 – 2016-10-28 (×2): 150 ug via ORAL
  Filled 2016-10-26 (×3): qty 2

## 2016-10-26 MED ORDER — LORATADINE 10 MG PO TABS: 10.0000 mg | ORAL_TABLET | Freq: Every day | ORAL | Status: DC | PRN

## 2016-10-26 MED ORDER — ACETAMINOPHEN 325 MG PO TABS: 650.0000 mg | ORAL_TABLET | Freq: Four times a day (QID) | ORAL | Status: DC | PRN

## 2016-10-26 NOTE — ED Notes (Signed)
Patient aware urine specimen needed. Will let nurse know when she is able to urinate.

## 2016-10-26 NOTE — ED Provider Notes (Signed)
[facility] DEPT Provider Note   CSN: [id number] Arrival date & time: 10/26/16  1204     History   Chief Complaint Chief Complaint  Patient presents with  . Cough  . Shortness of Breath    HPI Kara Wright is a 71 y.o. female.  HPI Patient presents with several weeks of URI symptoms but worsening shortness of breath over the past 3 days. States her cough is nonproductive. She has wheezing and bilateral chest soreness which she attributes to the coughing. She endorses dyspnea with exertion. No fever or shaking chills. Denies any new lower extremity swelling or pain. Was recently seen in urgent care and started on doxycycline. States she's been unable to take the doxycycline due to vomiting. Has had one loose stool. No blood in either stool or vomit. Past Medical History:  Diagnosis Date  . Atrial fib/flutter, transient    1 episode, resolved, never on coumadin  . Cancer ([place])    skin cancer  . Diverticulosis 03/1997  . Dysrhythmia    atrial fibrilation  . GERD (gastroesophageal reflux disease) 1997   good on prilosec  . Hepatitis    hx of hep B  . Hyperlipidemia 08/1996  . Hypothyroidism 1984  . Migraine    w/o aura  . Osteopenia 11/2002   via dexa  . Stroke [facility]) 08/2011   TIA    Patient Active Problem List   Diagnosis Date Noted  . Acute hypoxemic respiratory failure ([place]) 10/27/2016  . COPD with acute exacerbation ([place]) 10/26/2016  . UTI (urinary tract infection) 10/26/2016  . Low back pain 08/04/2016  . Morbid obesity ([place]) 04/05/2015  . Advance care planning 08/07/2014  . Anxiety 07/16/2012  . Esophagitis 04/17/2012  . 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 TIA (transient ischemic attack) 08/27/2011  . Medicare annual wellness visit, initial 06/25/2011  . Atrial fibrillation ([place]) 10/31/2010  . HTN (hypertension) 10/31/2010  . Obesity 10/31/2010  . 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 Hypothyroidism 12/15/2006  . HLD (hyperlipidemia) 12/15/2006   . MIGRAINE HEADACHE 12/15/2006  . GLAUCOMA 12/15/2006  . GERD (gastroesophageal reflux disease) 12/15/2006  . DIVERTICULOSIS, COLON 12/15/2006  . Osteoporosis 12/15/2006    Past Surgical History:  Procedure Laterality Date  . ABDOMINAL HYSTERECTOMY    . ANAL FISSURE REPAIR    . CHOLECYSTECTOMY  07/2006  . 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 nasolabial fold skin flap surgery  08/02/2007   [facility]  . PARTIAL HYSTERECTOMY  1978-79  . POLYPECTOMY  04/06/97   colon, benign by pathology  . varicose vein ablation sclerotherapy  09/2008   Dr. Hulda Humphrey    OB History    No data available       Home Medications    Prior to Admission medications   Medication Sig Start Date End Date Taking? Authorizing Provider  chlorpheniramine-HYDROcodone (TUSSIONEX PENNKINETIC ER) 10-8 MG/5ML SUER Take 5 mLs by mouth every 12 (twelve) hours as needed. 10/21/16  Yes Cook, Jayce G, DO  diltiazem (CARDIZEM) 30 MG tablet Take 1 tablet (30 mg total) by mouth 3 (three) times daily as needed. Patient taking differently: Take 30 mg by mouth 3 (three) times daily as needed (afib).  04/05/15  Yes Gollan, Kathlene November, MD  levothyroxine (SYNTHROID, LEVOTHROID) 150 MCG tablet Take 1 tablet (150 mcg total) by mouth daily. Except for 1.5 on Sunday 08/03/16  Yes Tonia Ghent, MD  loratadine (CLARITIN) 10 MG tablet Take 1 tablet (10  mg total) by mouth daily as needed for rhinitis or itching. 08/03/16  Yes Tonia Ghent, MD  omeprazole (PRILOSEC) 20 MG capsule Take 1 capsule (20 mg total) by mouth daily. 08/03/16  Yes Tonia Ghent, MD  promethazine (PHENERGAN) 25 MG tablet Take 1 tablet (25 mg total) by mouth every 6 (six) hours as needed. For nausea 08/03/16  Yes Tonia Ghent, MD  propranolol ER (INDERAL LA) 120 MG 24 hr capsule TAKE ONE CAPSULE BY MOUTH DAILY Patient taking differently: TAKE 120mg  CAPSULE BY MOUTH at night 08/17/16  Yes Gollan, Kathlene November, MD  rosuvastatin  (CRESTOR) 10 MG tablet Take 1 tablet (10 mg total) by mouth daily. Patient taking differently: Take 5 mg by mouth at bedtime.  01/18/16  Yes Gollan, Kathlene November, MD  Vitamin D, Ergocalciferol, (DRISDOL) 50000 units CAPS capsule Take 1 capsule (50,000 Units total) by mouth every 7 (seven) days. Patient taking differently: Take 50,000 Units by mouth every 7 (seven) days. TUESDAYS 08/03/16  Yes Tonia Ghent, MD  XARELTO 20 MG TABS tablet TAKE ONE (1) TABLET BY MOUTH EVERY DAY WITH SUPPER Patient taking differently: TAKE 20mg  BY MOUTH EVERY DAY WITH SUPPER 06/26/16  Yes Gollan, Kathlene November, MD  cefUROXime (CEFTIN) 250 MG tablet Take 1 tablet (250 mg total) by mouth 2 (two) times daily. 10/29/16 11/02/16  Jani Gravel, MD  levalbuterol Penne Lash) 0.63 MG/3ML nebulizer solution Take 3 mLs (0.63 mg total) by nebulization 3 (three) times daily as needed for wheezing or shortness of breath. 10/29/16   Jani Gravel, MD  predniSONE (DELTASONE) 10 MG tablet 40mg  po qday x 2 days then 30mg  po qday x 2 days then 20mg  po qday x 2 days then 10mg  po qday x 2 days 10/29/16   Jani Gravel, MD  Probiotic Product (VSL#3) CAPS 1 capsule po qday (Probiotic) 10/29/16   Jani Gravel, MD    Family History Family History  Problem Relation Age of Onset  . Hypertension Mother   . Osteopenia Mother   . Stroke Mother        deceased age 23  . Colon cancer Father 60       mets  . Allergies Sister   . Breast cancer Cousin        pat cousin    Social History Social History  Substance Use Topics  . Smoking status: Former Smoker    Quit date: 06/26/1986  . Smokeless tobacco: Never Used  . Alcohol use No     Allergies   Celecoxib; Gold-containing drug products; Nickel; Other; and Simvastatin   Review of Systems Review of Systems  Constitutional: Negative for chills.  HENT: Negative for congestion and sore throat.   Respiratory: Positive for cough, chest tightness, shortness of breath and wheezing.   Cardiovascular: Positive for  chest pain. Negative for palpitations and leg swelling.  Gastrointestinal: Positive for nausea and vomiting. Negative for abdominal pain and diarrhea.  Genitourinary: Negative for dysuria, flank pain, frequency and hematuria.  Musculoskeletal: Negative for arthralgias, back pain, joint swelling, myalgias, neck pain and neck stiffness.  Skin: Negative for rash and wound.  Neurological: Negative for dizziness, weakness, light-headedness, numbness and headaches.  All other systems reviewed and are negative.    Physical Exam Updated Vital Signs BP (!) 146/70 (BP Location: Left Arm)   Pulse 64   Temp 98.4 F (36.9 C) (Oral)   Resp 20   Ht 5\' 5"  (1.651 m)   Wt 236 lb (107 kg)   SpO2  93%   BMI 39.27 kg/m   Physical Exam  Constitutional: She is oriented to person, place, and time. She appears well-developed and well-nourished. No distress.  HENT:  Head: Normocephalic and atraumatic.  Mouth/Throat: Oropharynx is clear and moist. No oropharyngeal exudate.  Eyes: EOM are normal. Pupils are equal, round, and reactive to light.  Neck: Normal range of motion. Neck supple. No JVD present.  Cardiovascular: Normal rate and regular rhythm.  Exam reveals no gallop and no friction rub.   No murmur heard. Pulmonary/Chest: She has wheezes.  Mild increased his respiratory effort. The patient has decreased air movement throughout with end expiratory wheezes. few crackles in bilateral bases.  Abdominal: Soft. Bowel sounds are normal. There is no tenderness. There is no rebound and no guarding.  Musculoskeletal: Normal range of motion. She exhibits no edema or tenderness.  No lower extremity swelling or tenderness. 2+ distal pulses.  Neurological: She is alert and oriented to person, place, and time.  Moves all extremities without focal deficit. Sensation fully intact.  Skin: Skin is warm and dry. Capillary refill takes less than 2 seconds. No rash noted. No erythema.  Psychiatric: She has a normal  mood and affect. Her behavior is normal.  Nursing note and vitals reviewed.    ED Treatments / Results  Labs (all labs ordered are listed, but only abnormal results are displayed) Labs Reviewed  URINE CULTURE - Abnormal; Notable for the following:       Result Value   Culture >=100,000 COLONIES/mL PROTEUS MIRABILIS (*)    Organism ID, Bacteria PROTEUS MIRABILIS (*)    All other components within normal limits  BASIC METABOLIC PANEL - Abnormal; Notable for the following:    Chloride 97 (*)    Glucose, Bld 119 (*)    All other components within normal limits  CBC - Abnormal; Notable for the following:    RBC 5.15 (*)    Platelets 147 (*)    All other components within normal limits  URINALYSIS, ROUTINE W REFLEX MICROSCOPIC - Abnormal; Notable for the following:    Color, Urine AMBER (*)    APPearance HAZY (*)    Hgb urine dipstick SMALL (*)    Bilirubin Urine SMALL (*)    Protein, ur 30 (*)    Nitrite POSITIVE (*)    Leukocytes, UA MODERATE (*)    Bacteria, UA MANY (*)    Squamous Epithelial / LPF 0-5 (*)    Non Squamous Epithelial 0-5 (*)    All other components within normal limits  HEPATIC FUNCTION PANEL - Abnormal; Notable for the following:    Indirect Bilirubin 1.0 (*)    All other components within normal limits  COMPREHENSIVE METABOLIC PANEL - Abnormal; Notable for the following:    Chloride 100 (*)    Glucose, Bld 156 (*)    All other components within normal limits  CBC - Abnormal; Notable for the following:    WBC 2.9 (*)    All other components within normal limits  BASIC METABOLIC PANEL - Abnormal; Notable for the following:    Glucose, Bld 130 (*)    Calcium 8.6 (*)    All other components within normal limits  D-DIMER, QUANTITATIVE (NOT AT [facility])  BRAIN NATRIURETIC PEPTIDE  TSH  TROPONIN I  TROPONIN I  CBC  I-STAT TROPOININ, ED    EKG  EKG Interpretation  Date/Time:  Thursday Oct 26 2016 12:21:19 EDT Ventricular Rate:  63 PR  Interval:  148 QRS Duration: 86  QT Interval:  414 QTC Calculation: 423 R Axis:   87 Text Interpretation:  Normal sinus rhythm Normal ECG Confirmed by Lita Mains  MD, Huntley Knoop ([id number]) on 10/26/2016 1:12:43 PM       Radiology No results found.  Procedures Procedures (including critical care time)  Medications Ordered in ED Medications  albuterol (PROVENTIL) (2.5 MG/3ML) 0.083% nebulizer solution 5 mg (5 mg Nebulization Given 10/26/16 1335)  methylPREDNISolone sodium succinate (SOLU-MEDROL) 125 mg/2 mL injection 125 mg (125 mg Intravenous Given 10/26/16 1335)  levofloxacin (LEVAQUIN) IVPB 500 mg (0 mg Intravenous Stopped 10/26/16 1617)   CRITICAL CARE Performed by: Lita Mains, Abigaelle Verley Total critical care time: 30 minutes Critical care time was exclusive of separately billable procedures and treating other patients. Critical care was necessary to treat or prevent imminent or life-threatening deterioration. Critical care was time spent personally by me on the following activities: development of treatment plan with patient and/or surrogate as well as nursing, discussions with consultants, evaluation of patient's response to treatment, examination of patient, obtaining history from patient or surrogate, ordering and performing treatments and interventions, ordering and review of laboratory studies, ordering and review of radiographic studies, pulse oximetry and re-evaluation of patient's condition.  Initial Impression / Assessment and Plan / ED Course  I have reviewed the triage vital signs and the nursing notes.  Pertinent labs & imaging results that were available during my care of the patient were reviewed by me and considered in my medical decision making (see chart for details).    Patient with persistent oxygen requirement and inspiratory wheezing despite multiple nebulized treatment. Given Solu-Medrol. Also start on antibiotics given infectious symptoms. Will discuss with hospitalist  regarding admission.   Final Clinical Impressions(s) / ED Diagnoses   Final diagnoses:  SOB (shortness of breath)    New Prescriptions Discharge Medication List as of 10/29/2016 11:54 AM    START taking these medications   Details  cefUROXime (CEFTIN) 250 MG tablet Take 1 tablet (250 mg total) by mouth 2 (two) times daily., Starting Sun 10/29/2016, Until Thu 11/02/2016, Print    levalbuterol (XOPENEX) 0.63 MG/3ML nebulizer solution Take 3 mLs (0.63 mg total) by nebulization 3 (three) times daily as needed for wheezing or shortness of breath., Starting Sun 10/29/2016, Print    predniSONE (DELTASONE) 10 MG tablet 40mg  po qday x 2 days then 30mg  po qday x 2 days then 20mg  po qday x 2 days then 10mg  po qday x 2 days, Print    Probiotic Product (VSL#3) CAPS 1 capsule po qday (Probiotic), Print         Julianne Rice, MD 10/30/16 1431

## 2016-10-26 NOTE — Progress Notes (Signed)
Pt has arrived to 2w from [facility] ED. Telemetry box applied and CCMD notified. Pt oriented to room. Will continue current plan of care.    Grant Fontana BSN, RN

## 2016-10-26 NOTE — ED Notes (Signed)
RT coming to start continuous neb.

## 2016-10-26 NOTE — Telephone Encounter (Signed)
Will await ER notes.  Thanks.  

## 2016-10-26 NOTE — H&P (Addendum)
[facility] History and Physical  Kara Wright QMV:[id number] DOB: 01/17/1946 DOA: 10/26/2016  Referring physician:   PCP: Tonia Ghent, MD   Chief Complaint:    HPI:    71 year old female with a history of hypothyroidism, gastroesophageal reflux, atrial fibrillation on anticoagulation, dyslipidemia, who presents to the ER with a chief complaint of dyspnea on exertion, worsening over the last 3 months, symptoms acutely worsened over the last 3 days, after she developed cough, congestion, sore throat. Patient denied any fever. Patient was seen in urgent care on 10/21/16 and started on doxycycline and Tussionex. Patient has had no improvement in her symptoms, she presented to the ER today with shortness of breath, denied chest pain, orthopnea. ED course Found to be 88% on room air. BP (!) 158/82 (BP Location: Right Arm)   Pulse 65   Temp 98 F (36.7 C) (Oral)   Resp (!) 24   Found to have expiratory wheezes, chest x-ray negative Patient started on nebulizer treatments, steroids, antibiotics and admitted for acute bronchitis   Review of Systems: negative for the following  Constitutional: Negative for chills.  HENT: Negative for congestion and sore throat.   Respiratory: Positive for cough, chest tightness, shortness of breath and wheezing.   Cardiovascular: Positive for chest pain. Negative for palpitations and leg swelling.  Gastrointestinal: Positive for nausea and vomiting. Negative for abdominal pain and diarrhea.  Genitourinary: Negative for dysuria, flank pain, frequency and hematuria.  Musculoskeletal: Negative for arthralgias, back pain, joint swelling, myalgias, neck pain and neck stiffness.  Skin: Negative for rash and wound.  Neurological: Negative for dizziness, weakness, light-headedness, numbness and headaches.  Hematological: Denies adenopathy. Easy bruising, personal or family bleeding history  Psychiatric/Behavioral: Denies suicidal ideation, mood changes,  confusion, nervousness, sleep disturbance and agitation       Past Medical History:  Diagnosis Date  . Atrial fib/flutter, transient    1 episode, resolved, never on coumadin  . Cancer ([place])    skin cancer  . Diverticulosis 03/1997  . Dysrhythmia    atrial fibrilation  . GERD (gastroesophageal reflux disease) 1997   good on prilosec  . Hepatitis    hx of hep B  . Hyperlipidemia 08/1996  . Hypothyroidism 1984  . Migraine    w/o aura  . Osteopenia 11/2002   via dexa  . Stroke [facility]) 08/2011   TIA     Past Surgical History:  Procedure Laterality Date  . ABDOMINAL HYSTERECTOMY    . ANAL FISSURE REPAIR    . CHOLECYSTECTOMY  07/2006  . 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 nasolabial fold skin flap surgery  08/02/2007   [facility]  . PARTIAL HYSTERECTOMY  1978-79  . POLYPECTOMY  04/06/97   colon, benign by pathology  . varicose vein ablation sclerotherapy  09/2008   Dr. Hulda Humphrey      Social History:  reports that she quit smoking about 30 years ago. She has never used smokeless tobacco. She reports that she does not drink alcohol or use drugs.    Allergies  Allergen Reactions  . Celecoxib     REACTION: nausea: GI UPSET  . Gold-Containing Drug Products   . Nickel     Skin rash, allergy  . Other     Fragrance, phenylenediamine, contrast metal agents  . Simvastatin     Aches but tolerates crestor    Family History  Problem Relation Age of Onset  . Hypertension Mother   .  Osteopenia Mother   . Stroke Mother        deceased age 48  . Colon cancer Father 60       mets  . Allergies Sister   . Breast cancer Cousin        pat cousin        Prior to Admission medications   Medication Sig Start Date End Date Taking? Authorizing Provider  chlorpheniramine-HYDROcodone (TUSSIONEX PENNKINETIC ER) 10-8 MG/5ML SUER Take 5 mLs by mouth every 12 (twelve) hours as needed. 10/21/16  Yes Cook, Jayce G, DO  diltiazem (CARDIZEM) 30 MG tablet Take  1 tablet (30 mg total) by mouth 3 (three) times daily as needed. Patient taking differently: Take 30 mg by mouth 3 (three) times daily as needed (afib).  04/05/15  Yes Gollan, Kathlene November, MD  doxycycline (VIBRAMYCIN) 100 MG capsule Take 1 capsule (100 mg total) by mouth 2 (two) times daily. 10/21/16  Yes Cook, Jayce G, DO  levothyroxine (SYNTHROID, LEVOTHROID) 150 MCG tablet Take 1 tablet (150 mcg total) by mouth daily. Except for 1.5 on Sunday 08/03/16  Yes Tonia Ghent, MD  loratadine (CLARITIN) 10 MG tablet Take 1 tablet (10 mg total) by mouth daily as needed for rhinitis or itching. 08/03/16  Yes Tonia Ghent, MD  omeprazole (PRILOSEC) 20 MG capsule Take 1 capsule (20 mg total) by mouth daily. 08/03/16  Yes Tonia Ghent, MD  promethazine (PHENERGAN) 25 MG tablet Take 1 tablet (25 mg total) by mouth every 6 (six) hours as needed. For nausea 08/03/16  Yes Tonia Ghent, MD  propranolol ER (INDERAL LA) 120 MG 24 hr capsule TAKE ONE CAPSULE BY MOUTH DAILY Patient taking differently: TAKE 120mg  CAPSULE BY MOUTH at night 08/17/16  Yes Gollan, Kathlene November, MD  rosuvastatin (CRESTOR) 10 MG tablet Take 1 tablet (10 mg total) by mouth daily. Patient taking differently: Take 5 mg by mouth at bedtime.  01/18/16  Yes Gollan, Kathlene November, MD  Vitamin D, Ergocalciferol, (DRISDOL) 50000 units CAPS capsule Take 1 capsule (50,000 Units total) by mouth every 7 (seven) days. Patient taking differently: Take 50,000 Units by mouth every 7 (seven) days. TUESDAYS 08/03/16  Yes Tonia Ghent, MD  XARELTO 20 MG TABS tablet TAKE ONE (1) TABLET BY MOUTH EVERY DAY WITH SUPPER Patient taking differently: TAKE 20mg  BY MOUTH EVERY DAY WITH SUPPER 06/26/16  Yes Minna Merritts, MD     Physical Exam: Vitals:   10/26/16 1400 10/26/16 1500 10/26/16 1526 10/26/16 1530  BP: 140/79 130/68  (!) 127/59  Pulse: (!) 58 (!) 59  (!) 59  Resp: 14 18  14   Temp:      TempSrc:      SpO2: 97% 92% 97% 97%        Vitals:    10/26/16 1400 10/26/16 1500 10/26/16 1526 10/26/16 1530  BP: 140/79 130/68  (!) 127/59  Pulse: (!) 58 (!) 59  (!) 59  Resp: 14 18  14   Temp:      TempSrc:      SpO2: 97% 92% 97% 97%   Constitutional: NAD, calm, comfortable Eyes: PERRL, lids and conjunctivae normal ENMT: Mucous membranes are moist. Posterior pharynx clear of any exudate or lesions.Normal dentition.  Neck: normal, supple, no masses, no thyromegaly Respiratory: clear to auscultation bilaterally, no wheezing, no crackles. Normal respiratory effort. No accessory muscle use.  Cardiovascular: Regular rate and rhythm, no murmurs / rubs / gallops. No extremity edema. 2+ pedal pulses. No carotid  bruits.  Abdomen: no tenderness, no masses palpated. No hepatosplenomegaly. Bowel sounds positive.  Musculoskeletal: no clubbing / cyanosis. No joint deformity upper and lower extremities. Good ROM, no contractures. Normal muscle tone.  Skin: no rashes, lesions, ulcers. No induration Neurologic: CN 2-12 grossly intact. Sensation intact, DTR normal. Strength 5/5 in all 4.  Psychiatric: Normal judgment and insight. Alert and oriented x 3. Normal mood.     Labs on Admission: I have personally reviewed following labs and imaging studies  CBC:  Recent Labs Lab 10/26/16 1318  WBC 6.0  HGB 14.2  HCT 43.5  MCV 84.5  PLT 147*    Basic Metabolic Panel:  Recent Labs Lab 10/26/16 1318  NA 136  K 4.0  CL 97*  CO2 29  GLUCOSE 119*  BUN 12  CREATININE 0.75  CALCIUM 9.0    GFR: Estimated Creatinine Clearance: 79.5 mL/min (by C-G formula based on SCr of 0.75 mg/dL).  Liver Function Tests:  Recent Labs Lab 10/26/16 1306  AST 25  ALT 18  ALKPHOS 74  BILITOT 1.2  PROT 7.0  ALBUMIN 3.8   No results for input(s): LIPASE, AMYLASE in the last 168 hours. No results for input(s): AMMONIA in the last 168 hours.  Coagulation Profile: No results for input(s): INR, PROTIME in the last 168 hours.  Recent Labs  10/26/16 1306   DDIMER 0.41    Cardiac Enzymes:  Recent Labs Lab 10/26/16 1624  TROPONINI <0.03    BNP (last 3 results) No results for input(s): PROBNP in the last 8760 hours.  HbA1C: No results for input(s): HGBA1C in the last 72 hours. Lab Results  Component Value Date   HGBA1C 5.3 07/27/2016   HGBA1C 5.4 07/30/2014   HGBA1C 5.2 07/21/2013     CBG: No results for input(s): GLUCAP in the last 168 hours.  Lipid Profile: No results for input(s): CHOL, HDL, LDLCALC, TRIG, CHOLHDL, LDLDIRECT in the last 72 hours.  Thyroid Function Tests:  Recent Labs  10/26/16 1624  TSH 1.687    Anemia Panel: No results for input(s): VITAMINB12, FOLATE, FERRITIN, TIBC, IRON, RETICCTPCT in the last 72 hours.  Urine analysis:    Component Value Date/Time   COLORURINE AMBER (A) 10/26/2016 1426   APPEARANCEUR HAZY (A) 10/26/2016 1426   LABSPEC 1.024 10/26/2016 1426   PHURINE 6.0 10/26/2016 1426   GLUCOSEU NEGATIVE 10/26/2016 1426   HGBUR SMALL (A) 10/26/2016 1426   HGBUR trace-lysed 04/07/2009 0958   BILIRUBINUR SMALL (A) 10/26/2016 1426   BILIRUBINUR Negative 03/17/2016 1345   KETONESUR NEGATIVE 10/26/2016 1426   PROTEINUR 30 (A) 10/26/2016 1426   UROBILINOGEN 1.0 03/17/2016 1345   UROBILINOGEN 0.2 08/28/2011 1112   NITRITE POSITIVE (A) 10/26/2016 1426   LEUKOCYTESUR MODERATE (A) 10/26/2016 1426    Sepsis Labs: @LABRCNTIP (procalcitonin:4,lacticidven:4) )No results found for this or any previous visit (from the past 240 hour(s)).       Radiological Exams on Admission: Dg Chest 2 View  Result Date: 10/26/2016 CLINICAL DATA:  71 year old female with 4 weeks of cough, congestion and progressive shortness of breath. EXAM: CHEST  2 VIEW COMPARISON:  Prior chest x-ray 10/21/2010 FINDINGS: The lungs are clear and negative for focal airspace consolidation, pulmonary edema or suspicious pulmonary nodule. Chronic bronchitic changes and interstitial prominence are similar compared to prior. No  pleural effusion or pneumothorax. Cardiac and mediastinal contours are within normal limits. Atherosclerotic calcifications present in the transverse aorta. Surgical clips are present in the right upper quadrant consistent with prior cholecystectomy. No  acute fracture or lytic or blastic osseous lesions. The visualized upper abdominal bowel gas pattern is unremarkable. IMPRESSION: No active cardiopulmonary disease. Stable chronic bronchitic changes and mild interstitial prominence. Electronically Signed   By: Jacqulynn Cadet M.D.   On: 10/26/2016 12:52   Dg Chest 2 View  Result Date: 10/26/2016 CLINICAL DATA:  71 year old female with 4 weeks of cough, congestion and progressive shortness of breath. EXAM: CHEST  2 VIEW COMPARISON:  Prior chest x-ray 10/21/2010 FINDINGS: The lungs are clear and negative for focal airspace consolidation, pulmonary edema or suspicious pulmonary nodule. Chronic bronchitic changes and interstitial prominence are similar compared to prior. No pleural effusion or pneumothorax. Cardiac and mediastinal contours are within normal limits. Atherosclerotic calcifications present in the transverse aorta. Surgical clips are present in the right upper quadrant consistent with prior cholecystectomy. No acute fracture or lytic or blastic osseous lesions. The visualized upper abdominal bowel gas pattern is unremarkable. IMPRESSION: No active cardiopulmonary disease. Stable chronic bronchitic changes and mild interstitial prominence. Electronically Signed   By: Jacqulynn Cadet M.D.   On: 10/26/2016 12:52   Dg Bone Density  Result Date: 10/16/2016 EXAM: DUAL X-RAY ABSORPTIOMETRY (DXA) FOR BONE MINERAL DENSITY IMPRESSION: Dear Dr Elsie Stain, Your patient Kara Wright completed a BMD test on 10/16/2016 using the [place] (analysis version: 14.10) manufactured by [employer]. The following summarizes the results of our evaluation. PATIENT BIOGRAPHICAL: Name: Layonna, Dobie Patient ID: [id number] Birth Date: 1945/10/26 Height: 64.5 in. Gender: Female Exam Date: 10/16/2016 Weight: 239.0 lbs. Indications: Advanced Age, arthritis, Caucasian, Family History of Fracture, Family Hx of Osteoporosis, History of Fracture (Adult), Hysterectomy, osteopenia, POSTmenopausal, skin ca Fractures: RIBS, SHOULDER, toe Treatments: levothyroxin, Vitamin D, XARELTO ASSESSMENT: The BMD measured at AP Spine L1-L4 is 0.871 g/cm2 with a T-score of -2.6. This patient is considered OSTEOPOROTIC according to [facility]) criteria. Patient is not a candidate for FRAX assessment due to osteoporosis report. Site Region Measured Measured WHO Young Adult BMD Date       Age      Classification T-score AP Spine L1-L4 10/16/2016 70.2 Osteoporosis -2.6 0.871 g/cm2 DualFemur Neck Right 10/16/2016 70.2 Osteopenia -2.1 0.753 g/cm2 World Health Organization [facility]) criteria for post-menopausal, Caucasian Women: Normal:       T-score at or above -1 SD Osteopenia:   T-score between -1 and -2.5 SD Osteoporosis: T-score at or below -2.5 SD RECOMMENDATIONS: [place] recommends that FDA-approved medical therapies be considered in postemenopausal women and men age 58 or older with a: 1. Hip or vertebral (clinical or morphometric) fracture. 2. T-score of < -2.5at the spine or hip. 3. Ten-year fracture probability by FRAX of 3% or greater for hip fracture or 20% or greater for major osteoporotic fracture. All treatment decisions require clinical judgment and consideration of individual patient factors, including patient preferences, co-morbidities, previous drug use, risk factors not captured in the FRAX model (e.g. falls, vitamin D deficiency, increased bone turnover, interval significant decline in bone density) and possible under - or over-estimation of fracture risk by FRAX. All patients should ensure an adequate intake of dietary calcium (1200 mg/d) and vitamin D (800 IU daily)  unless contraindicated. FOLLOW-UP: People with diagnosed cases of osteoporosis or at high risk for fracture should have regular bone mineral density tests. For patients eligible for Medicare, routine testing is allowed once every 2 years. The testing frequency can be increased to one year for patients who have rapidly progressing disease, those who  are receiving or discontinuing medical therapy to restore bone mass, or have additional risk factors. I have reviewed this report, and agree with the above findings. Mark A. Thornton Papas, M.D. [facility] Radiology Electronically Signed   By: Lavonia Dana M.D.   On: 10/16/2016 11:48   Mm Screening Breast Tomo Bilateral  Result Date: 10/16/2016 CLINICAL DATA:  Screening. EXAM: 2D DIGITAL SCREENING BILATERAL MAMMOGRAM WITH CAD AND ADJUNCT TOMO COMPARISON:  Previous exam(s). ACR Breast Density Category b: There are scattered areas of fibroglandular density. FINDINGS: There are no findings suspicious for malignancy. Images were processed with CAD. IMPRESSION: No mammographic evidence of malignancy. A result letter of this screening mammogram will be mailed directly to the patient. RECOMMENDATION: Screening mammogram in one year. (Code:[id number]-B-01Y) BI-RADS CATEGORY  1: Negative. Electronically Signed   By: Pamelia Hoit M.D.   On: 10/16/2016 13:46       EKG: Independently reviewed.  Normal sinus rhythm Normal ECG  Assessment/Plan Principal Problem:   COPD with acute exacerbation (HCC) Admit to telemetry Empiric antibiotics, Solu-Medrol, nebulizer treatments  Acute hypoxic respiratory failure Given the fact that the patient's symptoms have been ongoing, will obtain a high-resolution CT scan to rule out underlying interstitial lung disease, 2-D echo to rule out pulmonary hypertension Doubt PE as the patient is anticoagulated No history of sleep apnea  Atrial fibrillation-rate controlled Rate controlled, continue Cardizem, propranolol On Xarelto      Hypothyroidism-continue levothyroxin, check TSH    HLD (hyperlipidemia)-continue Crestor    GERD (gastroesophageal reflux disease)-continue PPI    HTN (hypertension)-continue Cardizem, propranolol     UTI (urinary tract infection)-empirically started on levofloxacin, follow urine culture    DVT prophylaxis: Xarelto     Code Status Orders full code           consults called:  Family Communication: Admission, patients condition and plan of care including tests being ordered have been discussed with the patient  who indicates understanding and agree with the plan and Code Status  Admission status: inpatient    Disposition plan: Further plan will depend as patient's clinical course evolves and further radiologic and laboratory data become available. Likely home when stable   At the time of admission, it appears that the appropriate admission status for this patient is INPATIENT .Thisis judged to be reasonable and necessary in order to provide the required intensity of service to ensure the patient's safetygiven thepresenting symptoms, physical exam findings, and initial radiographic and laboratory data in the context of their chronic comorbidities.   Reyne Dumas MD [facility] Pager [phone]  If 7PM-7AM, please contact night-coverage [email or web address] Password [email or web address]  10/26/2016, 5:50 PM

## 2016-10-26 NOTE — Telephone Encounter (Signed)
[facility] Patient Name: Kara Wright DOB: 1945/10/17 Initial Comment Caller is winded, chest is tight. Nurse Assessment Nurse: Markus Daft, RN, Sherre Poot Date/Time (Eastern Time): 10/26/2016 9:35:35 AM Confirm and document reason for call. If symptomatic, describe symptoms. ---Caller c/o feeling winded, productive cough, and chest tightness (rates 5-6/10, when she gets up to walk). Diagnosed with respiratory infection last Saturday and given antibiotics and cough syrup. Had cough for a month. Does the patient have any new or worsening symptoms? ---Yes Will a triage be completed? ---Yes Related visit to physician within the last 2 weeks? ---Yes Does the PT have any chronic conditions? (i.e. diabetes, asthma, etc.) ---Yes List chronic conditions. ---Afib - on Xarelto Is this a behavioral health or substance abuse call? ---No Guidelines Guideline Title Affirmed Question Affirmed Notes Cough - Acute Productive Chest pain (Exception: MILD central chest pain, present only when coughing) Final Disposition User Go to ED Now Markus Daft, RN, [place] Referrals GO TO FACILITY UNDECIDED Disagree/Comply: Comply

## 2016-10-26 NOTE — ED Triage Notes (Signed)
PT states that she has had congestion for over one month and the cough has just continued to get worse.  Pt states brown and and white sputum, no fever, no swelling.  Pt is sob with exertion and at rest. 84-88% on RA and placed on 02/2L [place] and sats increased to 95%.  Pt denies chf history

## 2016-10-26 NOTE — Telephone Encounter (Signed)
I spoke with pt and she is going to [place] now. 

## 2016-10-26 NOTE — ED Notes (Signed)
BSC at bedside. 

## 2016-10-27 ENCOUNTER — Inpatient Hospital Stay (HOSPITAL_COMMUNITY): Payer: PPO

## 2016-10-27 ENCOUNTER — Telehealth: Payer: Self-pay | Admitting: Family Medicine

## 2016-10-27 DIAGNOSIS — I340 Nonrheumatic mitral (valve) insufficiency: Secondary | ICD-10-CM

## 2016-10-27 DIAGNOSIS — J9601 Acute respiratory failure with hypoxia: Secondary | ICD-10-CM

## 2016-10-27 LAB — COMPREHENSIVE METABOLIC PANEL
ALBUMIN: 3.5 g/dL (ref 3.5–5.0)
ALK PHOS: 64 U/L (ref 38–126)
ALT: 16 U/L (ref 14–54)
AST: 19 U/L (ref 15–41)
Anion gap: 10 (ref 5–15)
BILIRUBIN TOTAL: 0.9 mg/dL (ref 0.3–1.2)
BUN: 10 mg/dL (ref 6–20)
CO2: 30 mmol/L (ref 22–32)
CREATININE: 0.69 mg/dL (ref 0.44–1.00)
Calcium: 9 mg/dL (ref 8.9–10.3)
Chloride: 100 mmol/L — ABNORMAL LOW (ref 101–111)
GFR calc Af Amer: >60 mL/min (ref >60)
GFR calc non Af Amer: >60 mL/min (ref >60)
GLUCOSE: 156 mg/dL — AB (ref 65–99)
Potassium: 3.9 mmol/L (ref 3.5–5.1)
Sodium: 140 mmol/L (ref 135–145)
TOTAL PROTEIN: 6.9 g/dL (ref 6.5–8.1)

## 2016-10-27 LAB — CBC
HCT: 42.1 % (ref 36.0–46.0)
Hemoglobin: 13.4 g/dL (ref 12.0–15.0)
MCH: 26.7 pg (ref 26.0–34.0)
MCHC: 31.8 g/dL (ref 30.0–36.0)
MCV: 84 fL (ref 78.0–100.0)
Platelets: 153 10*3/uL (ref 150–400)
RBC: 5.01 MIL/uL (ref 3.87–5.11)
RDW: 13.8 % (ref 11.5–15.5)
WBC: 2.9 10*3/uL — ABNORMAL LOW (ref 4.0–10.5)

## 2016-10-27 LAB — ECHOCARDIOGRAM COMPLETE
CHL CUP MV DEC (S): 250
E decel time: 250 msec
E/e' ratio: 9.3
FS: 29 % (ref 28–44)
HEIGHTINCHES: 65 in
IVS/LV PW RATIO, ED: 0.93
LA ID, A-P, ES: 43 mm
LA diam end sys: 43 mm
LA diam index: 2.03 cm/m2
LA vol index: 22.3 mL/m2
LA vol: 47.3 mL
LAVOLA4C: 37.4 mL
LDCA: 2.54 cm2
LV E/e' medial: 9.3
LV E/e'average: 9.3
LVELAT: 8.86 cm/s
LVOT VTI: 27 cm
LVOT peak grad rest: 7 mmHg
LVOT peak vel: 130 cm/s
LVOTD: 18 mm
LVOTSV: 69 mL
MV Peak grad: 3 mmHg
MV pk E vel: 82.4 m/s
MVPKAVEL: 73.3 m/s
PW: 12.3 mm — AB (ref 0.6–1.1)
RV LATERAL S' VELOCITY: 16.8 cm/s
TAPSE: 20 mm
TDI e' lateral: 8.86
TDI e' medial: 7.49
WEIGHTICAEL: 3776 [oz_av]

## 2016-10-27 MED ORDER — DEXTROSE 5 % IV SOLN
1.0000 g | INTRAVENOUS | Status: DC
  Administered 2016-10-27 – 2016-10-29 (×3): 1 g via INTRAVENOUS
  Filled 2016-10-27 (×3): qty 10

## 2016-10-27 MED ORDER — PREDNISONE 20 MG PO TABS
40.0000 mg | ORAL_TABLET | Freq: Every day | ORAL | Status: DC
  Administered 2016-10-28 – 2016-10-29 (×2): 40 mg via ORAL
  Filled 2016-10-27 (×2): qty 2

## 2016-10-27 NOTE — Plan of Care (Signed)
Problem: Education: Goal: Knowledge of  General Education information/materials will improve Outcome: Progressing Patient voiced understanding.

## 2016-10-27 NOTE — Telephone Encounter (Signed)
Left detailed message.   

## 2016-10-27 NOTE — Evaluation (Signed)
Physical Therapy Evaluation Patient Details Name: Kara Wright MRN: [id number] DOB: 23-Nov-1945 Today's Date: 10/27/2016   History of Present Illness  Pt is a 71 y/o female presenting to the ER with worsening DOE and found to have a COPD exacerbation. PMH including but not limited to hx of CVA/TIA in 2013, HLD and hypothyroidism.  Clinical Impression  Pt presented supine in bed with HOB elevated, awake and willing to participate in therapy session. Prior to admission, pt reported that she was independent with all functional mobility and ADLs. Pt continues to work full-time at an [employer]. Pt ambulated in hallway without an AD with supervision and participated in a higher level balance test (DGI - see below) with no concerns. No further acute PT needs identified at this time. PT signing off.    Follow Up Recommendations No PT follow up    Equipment Recommendations  None recommended by PT    Recommendations for Other Services       Precautions / Restrictions Precautions Precautions: None Restrictions Weight Bearing Restrictions: No      Mobility  Bed Mobility Overal bed mobility: Modified Independent                Transfers Overall transfer level: Needs assistance Equipment used: None Transfers: Sit to/from Stand Sit to Stand: Supervision         General transfer comment: supervision for safety  Ambulation/Gait Ambulation/Gait assistance: Supervision Ambulation Distance (Feet): 300 Feet Assistive device: None Gait Pattern/deviations: Step-through pattern;WFL(Within Functional Limits) Gait velocity: WFL Gait velocity interpretation: at or above normal speed for age/gender General Gait Details: no instability or LOB noted, supervision for safety  Stairs            Wheelchair Mobility    Modified Rankin (Stroke Patients Only)       Balance Overall balance assessment: Needs assistance Sitting-balance support: Feet supported Sitting  balance-Leahy Scale: Good     Standing balance support: During functional activity;No upper extremity supported Standing balance-Leahy Scale: Good                   Standardized Balance Assessment Standardized Balance Assessment : Dynamic Gait Index   Dynamic Gait Index Level Surface: Normal Change in Gait Speed: Normal Gait with Horizontal Head Turns: Normal Gait with Vertical Head Turns: Normal Gait and Pivot Turn: Mild Impairment Step Over Obstacle: Mild Impairment Step Around Obstacles: Mild Impairment Steps: Mild Impairment Total Score: 20       Pertinent Vitals/Pain Pain Assessment: No/denies pain    Home Living Family/patient expects to be discharged to:: Private residence Living Arrangements: Spouse/significant other Available Help at Discharge: Family;Available 24 hours/day Type of Home: House Home Access: Stairs to enter Entrance Stairs-Rails: Can reach both Entrance Stairs-Number of Steps: 3 Home Layout: One level Home Equipment: Shower seat      Prior Function Level of Independence: Independent               Hand Dominance   Dominant Hand: Left    Extremity/Trunk Assessment   Upper Extremity Assessment Upper Extremity Assessment: Overall WFL for tasks assessed    Lower Extremity Assessment Lower Extremity Assessment: Overall WFL for tasks assessed       Communication   Communication: No difficulties  Cognition Arousal/Alertness: Awake/alert Behavior During Therapy: WFL for tasks assessed/performed Overall Cognitive Status: Within Functional Limits for tasks assessed  General Comments      Exercises     Assessment/Plan    PT Assessment Patent does not need any further PT services  PT Problem List         PT Treatment Interventions      PT Goals (Current goals can be found in the Care Plan section)  Acute Rehab PT Goals Patient Stated Goal: return home     Frequency     Barriers to discharge        Co-evaluation               AM-PAC PT "6 Clicks" Daily Activity  Outcome Measure Difficulty turning over in bed (including adjusting bedclothes, sheets and blankets)?: None Difficulty moving from lying on back to sitting on the side of the bed? : None Difficulty sitting down on and standing up from a chair with arms (e.g., wheelchair, bedside commode, etc,.)?: None Help needed moving to and from a bed to chair (including a wheelchair)?: None Help needed walking in hospital room?: A Little Help needed climbing 3-5 steps with a railing? : A Little 6 Click Score: 22    End of Session Equipment Utilized During Treatment: Gait belt Activity Tolerance: Patient tolerated treatment well Patient left: in bed;with call bell/phone within reach;with family/visitor present Nurse Communication: Mobility status PT Visit Diagnosis: Other abnormalities of gait and mobility (R26.89)    Time: 8811-0315 PT Time Calculation (min) (ACUTE ONLY): 15 min   Charges:   PT Evaluation $PT Eval Moderate Complexity: 1 Procedure     PT G Codes:        Sherie Don, PT, DPT [place] 10/27/2016, 3:51 PM

## 2016-10-27 NOTE — Telephone Encounter (Signed)
Patient called to let Dr.Duncan know she's at [facility].

## 2016-10-27 NOTE — Consult Note (Signed)
            [facility] CM Primary Care Navigator  10/27/2016  Kara Wright 06/05/1946 [id number]   Went to see patient at the bedside to identify possible discharge needs.  Patient states having shortness of breath, persistent cough and congestion even after treatment given at Urgent Care, that had led to this admission. Patient endorses Dr. Elsie Wright with [place] [facility] as herprimary care provider.   Patient reports using [facility] in [place] to obtain medications without any problem.   Patient states managing her own medications at home straight out of the containers.   Patient mentioned being able to drive prior to admission, but husband (Kara Wright) or sister Kara Wright) can providetransportation toherdoctors'appointments after discharge.  Patient's husband will be her primary caregiver at home as stated.  Anticipated discharge plan ishome per patient.  Patient expressed understanding to call her primary care provider's officewhen she gets home,for a post discharge follow-upwithin a week or sooner if needed.Patient letter (with PCP's contact number) was provided as a reminder.  Discussed with patient regarding [facility] CM services available for health management. Patient verbally agreedand optedEMMI COPD calls instead of visits to help her manage at home and follow her up while recovering.   Referral was made for EMMI COPD calls after discharge.  For additional questions please contact:  Edwena Felty A. Jayzon Taras, BSN, RN-BC [facility] PRIMARY CARE Navigator Cell: [phone]

## 2016-10-27 NOTE — Progress Notes (Addendum)
PROGRESS NOTE    Kara Wright  LKT:[id number] DOB: May 10, 1946 DOA: 10/26/2016 PCP: Tonia Ghent, MD     Brief Narrative:  Kara Wright is a 71 year old female with a history of hypothyroidism, gastroesophageal reflux, atrial fibrillation on anticoagulation, dyslipidemia, who presents to the ER with a chief complaint of dyspnea on exertion, worsening over the last 3 months, symptoms acutely worsened over the last 3 days. Patient was seen in urgent care on 10/21/16 and started on doxycycline and Tussinex. Patient has had no improvement in her symptoms, she presented to the ER today with shortness of breath. In the emergency department, she was found to have expiratory wheezes with negative chest x-ray. She was admitted for further treatment for acute bronchitis and possible COPD exacerbation.  Assessment & Plan:   Principal Problem:   COPD with acute exacerbation ([place]) Active Problems:   Hypothyroidism   HLD (hyperlipidemia)   GERD (gastroesophageal reflux disease)   Atrial fibrillation ([facility])   HTN (hypertension)   Hyperglycemia   UTI (urinary tract infection)   Acute exacerbation of chronic obstructive pulmonary disease (COPD) ([place])  Acute hypoxemic respiratory failure -CT high-resolution without ILD findings  -Echocardiogram with normal EF and diastolic function  -D-dimer negative, low suspicion for PE  -Possible component of COPD, although no previous diagnosis. Would recommend outpatient pulmonary function test -Wean nasal cannula O2 as tolerated  Multilobar bronchopneumonia, possible COPD exacerbation -Continue IV antibiotics, steroid, nebulizer  12 x 11 mm nodule in the left upper lobe nodule -Likely benign area of mucoid impaction but would recommend follow up chest CT in 3 months   Paroxysmal atrial fibrillation -States that she only takes Cardizem as needed and not on a daily basis -Continue propanolol, Xarelto  Hypothyroidism -Continue  Synthroid  Hyperlipidemia -Continue Crestor  GERD -Continue PPI  Pyuria -Urine culture pending. Continue rocephin    DVT prophylaxis: Xarelto Code Status: Full Family Communication: No family at bedside Disposition Plan: Pending improvement   Consultants:   None  Procedures:   None  Antimicrobials:  Anti-infectives    Start     Dose/Rate Route Frequency Ordered Stop   10/27/16 1500  levofloxacin (LEVAQUIN) IVPB 750 mg  Status:  Discontinued     750 mg 100 mL/hr over 90 Minutes Intravenous Every 24 hours 10/26/16 1618 10/27/16 0841   10/27/16 1000  cefTRIAXone (ROCEPHIN) 1 g in dextrose 5 % 50 mL IVPB     1 g 100 mL/hr over 30 Minutes Intravenous Every 24 hours 10/27/16 0841     10/26/16 1500  levofloxacin (LEVAQUIN) IVPB 500 mg     500 mg 100 mL/hr over 60 Minutes Intravenous  Once 10/26/16 1445 10/26/16 1617        Subjective: Patient states that she feels better since being in the hospital. No chest pain, breathing seems better. Still having cough. No fevers or chills.  Objective: Vitals:   10/26/16 2136 10/27/16 0634 10/27/16 0915 10/27/16 0944  BP: 126/61 (!) 124/55  135/88  Pulse: 92 67 75 63  Resp: 18 18 18    Temp: 98.2 F (36.8 C) 97.5 F (36.4 C)    TempSrc: Oral Oral    SpO2: 97% 97% 97%   Weight: 107 kg (236 lb)     Height: 5\' 5"  (1.651 m)       Intake/Output Summary (Last 24 hours) at 10/27/16 1056 Last data filed at 10/27/16 0800  Gross per 24 hour  Intake  340 ml  Output                0 ml  Net              340 ml   Filed Weights   10/26/16 2136  Weight: 107 kg (236 lb)    Examination:  General exam: Appears calm and comfortable  Respiratory system: +minimal expiratory wheezing. Respiratory effort normal. On [place] O2  Cardiovascular system: S1 & S2 heard, RRR. No JVD, murmurs, rubs, gallops or clicks. No pedal edema. Gastrointestinal system: Abdomen is nondistended, soft and nontender. No organomegaly or masses felt.  Normal bowel sounds heard. Central nervous system: Alert and oriented. No focal neurological deficits. Extremities: Symmetric 5 x 5 power. Skin: No rashes, lesions or ulcers Psychiatry: Judgement and insight appear normal. Mood & affect appropriate.   Data Reviewed: I have personally reviewed following labs and imaging studies  CBC:  Recent Labs Lab 10/26/16 1318 10/27/16 0309  WBC 6.0 2.9*  HGB 14.2 13.4  HCT 43.5 42.1  MCV 84.5 84.0  PLT 147* 810   Basic Metabolic Panel:  Recent Labs Lab 10/26/16 1318 10/27/16 0309  NA 136 140  K 4.0 3.9  CL 97* 100*  CO2 29 30  GLUCOSE 119* 156*  BUN 12 10  CREATININE 0.75 0.69  CALCIUM 9.0 9.0   GFR: Estimated Creatinine Clearance: 79.5 mL/min (by C-G formula based on SCr of 0.69 mg/dL). Liver Function Tests:  Recent Labs Lab 10/26/16 1306 10/27/16 0309  AST 25 19  ALT 18 16  ALKPHOS 74 64  BILITOT 1.2 0.9  PROT 7.0 6.9  ALBUMIN 3.8 3.5   No results for input(s): LIPASE, AMYLASE in the last 168 hours. No results for input(s): AMMONIA in the last 168 hours. Coagulation Profile: No results for input(s): INR, PROTIME in the last 168 hours. Cardiac Enzymes:  Recent Labs Lab 10/26/16 1624 10/26/16 2213  TROPONINI <0.03 <0.03   BNP (last 3 results) No results for input(s): PROBNP in the last 8760 hours. HbA1C: No results for input(s): HGBA1C in the last 72 hours. CBG: No results for input(s): GLUCAP in the last 168 hours. Lipid Profile: No results for input(s): CHOL, HDL, LDLCALC, TRIG, CHOLHDL, LDLDIRECT in the last 72 hours. Thyroid Function Tests:  Recent Labs  10/26/16 1624  TSH 1.687   Anemia Panel: No results for input(s): VITAMINB12, FOLATE, FERRITIN, TIBC, IRON, RETICCTPCT in the last 72 hours. Sepsis Labs: No results for input(s): PROCALCITON, LATICACIDVEN in the last 168 hours.  No results found for this or any previous visit (from the past 240 hour(s)).     Radiology Studies: Dg Chest 2  View  Result Date: 10/26/2016 CLINICAL DATA:  71 year old female with 4 weeks of cough, congestion and progressive shortness of breath. EXAM: CHEST  2 VIEW COMPARISON:  Prior chest x-ray 10/21/2010 FINDINGS: The lungs are clear and negative for focal airspace consolidation, pulmonary edema or suspicious pulmonary nodule. Chronic bronchitic changes and interstitial prominence are similar compared to prior. No pleural effusion or pneumothorax. Cardiac and mediastinal contours are within normal limits. Atherosclerotic calcifications present in the transverse aorta. Surgical clips are present in the right upper quadrant consistent with prior cholecystectomy. No acute fracture or lytic or blastic osseous lesions. The visualized upper abdominal bowel gas pattern is unremarkable. IMPRESSION: No active cardiopulmonary disease. Stable chronic bronchitic changes and mild interstitial prominence. Electronically Signed   By: Jacqulynn Cadet M.D.   On: 10/26/2016 12:52   Ct Chest High  Resolution  Result Date: 10/27/2016 CLINICAL DATA:  71 year old female with history of shortness of breath. EXAM: CT CHEST WITHOUT CONTRAST TECHNIQUE: Multidetector CT imaging of the chest was performed following the standard protocol without intravenous contrast. High resolution imaging of the lungs, as well as inspiratory and expiratory imaging, was performed. COMPARISON:  CT the chest, abdomen and pelvis 07/28/2010. FINDINGS: Cardiovascular: Heart size is normal. There is no significant pericardial fluid, thickening or pericardial calcification. There is aortic atherosclerosis, as well as atherosclerosis of the great vessels of the mediastinum and the coronary arteries, including calcified atherosclerotic plaque in the left main, left anterior descending, left circumflex and right coronary arteries. Calcifications of the aortic valve (mild). Mediastinum/Nodes: No pathologically enlarged mediastinal or hilar lymph nodes. Please note that  accurate exclusion of hilar adenopathy is limited on noncontrast CT scans. Esophagus is unremarkable in appearance. No axillary lymphadenopathy. Lungs/Pleura: Scattered throughout both lungs there are peribronchovascular areas of ground-glass attenuation with some associated thickening of the peribronchovascular interstitium, most compatible with multilobar bronchopneumonia. In the lateral aspect of the left upper lobe (axial image 45 of series 5) there is a new 12 x 11 mm irregular-shaped nodule, with some adjacent clustered peribronchovascular nodules in the periphery of the left upper lobe; this is nonspecific, but favored to represent areas of mucoid impaction. No pleural effusions. High-resolution images demonstrate no significant regions of subpleural reticulation, traction bronchiectasis or frank honeycombing. Inspiratory and expiratory imaging demonstrates some mild air trapping, indicative of small airways disease. Upper Abdomen: Diffuse low attenuation throughout the visualized hepatic parenchyma, compatible with hepatic steatosis. Musculoskeletal: There are no aggressive appearing lytic or blastic lesions noted in the visualized portions of the skeleton. IMPRESSION: 1. No evidence to suggest interstitial lung disease. 2. The appearance of the lungs is most compatible with severe multilobar bronchopneumonia. 3. New 12 x 11 mm nodule in the left upper lobe with some adjacent satellite peribronchovascular nodularity. This is favored to represent a benign area of mucoid impaction, however, close attention on followup chest CT in 3 months is recommended to ensure the stability or resolution of this finding. This recommendation follows the consensus statement: Guidelines for Management of Incidental Pulmonary Nodules Detected on CT Images: From the Fleischner Society 2017; Radiology 2017; 284:228-243. 4. Aortic atherosclerosis, in addition to left main and 3 vessel coronary artery disease. Please note that  although the presence of coronary artery calcium documents the presence of coronary artery disease, the severity of this disease and any potential stenosis cannot be assessed on this non-gated CT examination. Assessment for potential risk factor modification, dietary therapy or pharmacologic therapy may be warranted, if clinically indicated. 5. There are faint calcifications of the aortic valve. Echocardiographic correlation for evaluation of potential valvular dysfunction may be warranted if clinically indicated. Electronically Signed   By: Vinnie Langton M.D.   On: 10/27/2016 08:24      Scheduled Meds: . diltiazem  30 mg Oral Q8H  . levalbuterol  0.63 mg Nebulization TID  . levothyroxine  150 mcg Oral Once per day on Mon Tue Wed Thu Fri Sat  . [START ON 10/29/2016] levothyroxine  225 mcg Oral Once per day on Sun  . methylPREDNISolone (SOLU-MEDROL) injection  60 mg Intravenous Q12H  . pantoprazole  40 mg Oral Daily  . propranolol ER  120 mg Oral Daily  . rivaroxaban  20 mg Oral Q supper  . rosuvastatin  10 mg Oral Daily   Continuous Infusions: . sodium chloride 75 mL/hr at 10/27/16 0644  .  albuterol 10 mg/hr (10/26/16 1523)  . cefTRIAXone (ROCEPHIN)  IV Stopped (10/27/16 1053)     LOS: 1 day    Time spent: 40 minutes   Dessa Phi, DO [facility] [email or web address] Password [facility] 10/27/2016, 10:56 AM

## 2016-10-27 NOTE — Progress Notes (Signed)
Patient ambulated in hallway around nurses station with friend,  No dizziness or pain,  Ambulating with O2 off, no shortness of breath, says she feels good.  Mervyn Skeeters, RN

## 2016-10-27 NOTE — Telephone Encounter (Signed)
Please let her know they are sending me the records and I'll follow along.  I hope she feels better soon.  Thanks.

## 2016-10-28 LAB — BASIC METABOLIC PANEL
Anion gap: 6 (ref 5–15)
BUN: 15 mg/dL (ref 6–20)
CHLORIDE: 103 mmol/L (ref 101–111)
CO2: 31 mmol/L (ref 22–32)
CREATININE: 0.79 mg/dL (ref 0.44–1.00)
Calcium: 8.6 mg/dL — ABNORMAL LOW (ref 8.9–10.3)
GFR calc Af Amer: >60 mL/min (ref >60)
GFR calc non Af Amer: >60 mL/min (ref >60)
Glucose, Bld: 130 mg/dL — ABNORMAL HIGH (ref 65–99)
Potassium: 3.5 mmol/L (ref 3.5–5.1)
Sodium: 140 mmol/L (ref 135–145)

## 2016-10-28 LAB — CBC
HEMATOCRIT: 40.7 % (ref 36.0–46.0)
HEMOGLOBIN: 12.9 g/dL (ref 12.0–15.0)
MCH: 27.1 pg (ref 26.0–34.0)
MCHC: 31.7 g/dL (ref 30.0–36.0)
MCV: 85.5 fL (ref 78.0–100.0)
Platelets: 153 10*3/uL (ref 150–400)
RBC: 4.76 MIL/uL (ref 3.87–5.11)
RDW: 14 % (ref 11.5–15.5)
WBC: 6.8 10*3/uL (ref 4.0–10.5)

## 2016-10-28 LAB — URINE CULTURE

## 2016-10-28 NOTE — Progress Notes (Signed)
Pt was seen and examined. We will reexamine in the late afternoon around 5pm and if stable try to dc home.

## 2016-10-28 NOTE — Progress Notes (Addendum)
Patient ID: Kara Wright, female   DOB: Apr 02, 1946, 71 y.o.   MRN: [id number]                                                                 PROGRESS NOTE                                                                                                                                                                                                             Patient Demographics:    Kara Wright, is a 71 y.o. female, DOB - 1945-08-24, XAJ:[id number]  Admit date - 10/26/2016   Admitting Physician Reyne Dumas, MD  Outpatient Primary MD for the patient is Tonia Ghent, MD  LOS - 2  Outpatient Specialists:   Chief Complaint  Patient presents with  . Cough  . Shortness of Breath       Brief Narrative  71 year old female with a history of hypothyroidism, gastroesophageal reflux, atrial fibrillation on anticoagulation, dyslipidemia, who presents to the ER with a chief complaint of dyspnea on exertion, worsening over the last 3 months, symptoms acutely worsened over the last 3 days. Patient was seen in urgent care on 10/21/16 and started on doxycycline and Tussinex. Patient has had no improvement in her symptoms, she presented to the ER today with shortness of breath. In the emergency department, she was found to have expiratory wheezes with negative chest x-ray. She was admitted for further treatment for acute bronchitis and possible COPD exacerbation.   Subjective:    Hedda Crumbley today still has dyspnea.  Slight cough. + wheezing.  Afebrile overnite.   Pox still desat to 88% w walking.  No headache, No chest pain, No abdominal pain - No Nausea, No new weakness tingling or numbness,      Assessment  & Plan :    Principal Problem:   Acute hypoxemic respiratory failure (HCC) Active Problems:   Hypothyroidism   HLD (hyperlipidemia)   GERD (gastroesophageal reflux disease)   Atrial fibrillation (HCC)   HTN (hypertension)   COPD with acute exacerbation (HCC)   UTI (urinary tract  infection)   Acute hypoxemic respiratory failure -CT high-resolution without ILD findings  -Echocardiogram with normal EF and diastolic function  -D-dimer negative, low suspicion for PE  -Possible component of COPD, although no previous  diagnosis. Would recommend outpatient pulmonary function test -Wean nasal cannula O2 as tolerated  Multilobar bronchopneumonia, possible COPD exacerbation -Continue IV antibiotics, steroid, nebulizer,   12 x 11 mm nodule in the left upper lobe nodule -Likely benign area of mucoid impaction but would recommend follow up chest CT in 3 months   Paroxysmal atrial fibrillation -States that she only takes Cardizem as needed and not on a daily basis -Continue propanolol, Xarelto  Hypothyroidism -Continue Synthroid  Hyperlipidemia -Continue Crestor  GERD -Continue PPI  Pyuria -Urine culture >proteus sensitive to keflex. . Continue rocephin , switch to keflex in am.    DVT prophylaxis: Xarelto Code Status: Full Family Communication: No family at bedside Disposition Plan: Pending improvement   Consultants:   None  Procedures:   None  DVT Prophylaxis  :  Lovenox - Heparin - SCDs   Lab Results  Component Value Date   PLT 153 10/28/2016    Levaquin 5/10=>5/11 Rocephin 5/11=>  Anti-infectives    Start     Dose/Rate Route Frequency Ordered Stop   10/27/16 1500  levofloxacin (LEVAQUIN) IVPB 750 mg  Status:  Discontinued     750 mg 100 mL/hr over 90 Minutes Intravenous Every 24 hours 10/26/16 1618 10/27/16 0841   10/27/16 1000  cefTRIAXone (ROCEPHIN) 1 g in dextrose 5 % 50 mL IVPB     1 g 100 mL/hr over 30 Minutes Intravenous Every 24 hours 10/27/16 0841     10/26/16 1500  levofloxacin (LEVAQUIN) IVPB 500 mg     500 mg 100 mL/hr over 60 Minutes Intravenous  Once 10/26/16 1445 10/26/16 1617        Objective:   Vitals:   10/28/16 0451 10/28/16 0833 10/28/16 1459 10/28/16 1509  BP: (!) 123/57 (!) 137/58  (!) 158/69    Pulse: 61 63  61  Resp: (!) 22 20  18   Temp: 97.9 F (36.6 C) 97.8 F (36.6 C)  97.8 F (36.6 C)  TempSrc: Oral Oral  Oral  SpO2: 94% 94% 98% 93%  Weight:      Height:        Wt Readings from Last 3 Encounters:  10/26/16 107 kg (236 lb)  10/21/16 107 kg (236 lb)  08/03/16 106.5 kg (234 lb 12 oz)     Intake/Output Summary (Last 24 hours) at 10/28/16 1743 Last data filed at 10/28/16 1711  Gross per 24 hour  Intake             1300 ml  Output              200 ml  Net             1100 ml     Physical Exam  Awake Alert, Oriented X 3, No new F.N deficits, Normal affect [place].AT,PERRAL Supple Neck,No JVD, No cervical lymphadenopathy appriciated.  Symmetrical Chest wall movement, improved air movement bilaterally, + scattered wheezing, no crackles RRR,No Gallops,Rubs or new Murmurs, No Parasternal Heave +ve B.Sounds, Abd Soft, No tenderness, No organomegaly appriciated, No rebound - guarding or rigidity. No Cyanosis, Clubbing or edema, No new Rash or bruise      Data Review:    CBC  Recent Labs Lab 10/26/16 1318 10/27/16 0309 10/28/16 0246  WBC 6.0 2.9* 6.8  HGB 14.2 13.4 12.9  HCT 43.5 42.1 40.7  PLT 147* 153 153  MCV 84.5 84.0 85.5  MCH 27.6 26.7 27.1  MCHC 32.6 31.8 31.7  RDW 14.2 13.8 14.0    Chemistries  Recent Labs Lab 10/26/16 1306 10/26/16 1318 10/27/16 0309 10/28/16 0246  NA  --  136 140 140  K  --  4.0 3.9 3.5  CL  --  97* 100* 103  CO2  --  29 30 31   GLUCOSE  --  119* 156* 130*  BUN  --  12 10 15   CREATININE  --  0.75 0.69 0.79  CALCIUM  --  9.0 9.0 8.6*  AST 25  --  19  --   ALT 18  --  16  --   ALKPHOS 74  --  64  --   BILITOT 1.2  --  0.9  --    ------------------------------------------------------------------------------------------------------------------ No results for input(s): CHOL, HDL, LDLCALC, TRIG, CHOLHDL, LDLDIRECT in the last 72 hours.  Lab Results  Component Value Date   HGBA1C 5.3 07/27/2016    ------------------------------------------------------------------------------------------------------------------  Recent Labs  10/26/16 1624  TSH 1.687   ------------------------------------------------------------------------------------------------------------------ No results for input(s): VITAMINB12, FOLATE, FERRITIN, TIBC, IRON, RETICCTPCT in the last 72 hours.  Coagulation profile No results for input(s): INR, PROTIME in the last 168 hours.   Recent Labs  10/26/16 1306  DDIMER 0.41    Cardiac Enzymes  Recent Labs Lab 10/26/16 1624 10/26/16 2213  TROPONINI <0.03 <0.03   ------------------------------------------------------------------------------------------------------------------    Component Value Date/Time   BNP 60.0 10/26/2016 1314    Inpatient Medications  Scheduled Meds: . levalbuterol  0.63 mg Nebulization TID  . levothyroxine  150 mcg Oral Once per day on Mon Tue Wed Thu Fri Sat  . [START ON 10/29/2016] levothyroxine  225 mcg Oral Once per day on Sun  . pantoprazole  40 mg Oral Daily  . predniSONE  40 mg Oral Q breakfast  . propranolol ER  120 mg Oral Daily  . rivaroxaban  20 mg Oral Q supper  . rosuvastatin  10 mg Oral Daily   Continuous Infusions: . cefTRIAXone (ROCEPHIN)  IV Stopped (10/28/16 0954)   PRN Meds:.acetaminophen **OR** acetaminophen, chlorpheniramine-HYDROcodone, loratadine, ondansetron **OR** ondansetron (ZOFRAN) IV, promethazine  Micro Results Recent Results (from the past 240 hour(s))  Urine culture     Status: Abnormal   Collection Time: 10/26/16  2:26 PM  Result Value Ref Range Status   Specimen Description URINE, RANDOM  Final   Special Requests NONE  Final   Culture >=100,000 COLONIES/mL PROTEUS MIRABILIS (A)  Final   Report Status 10/28/2016 FINAL  Final   Organism ID, Bacteria PROTEUS MIRABILIS (A)  Final      Susceptibility   Proteus mirabilis - MIC*    AMPICILLIN <=2 SENSITIVE Sensitive     CEFAZOLIN 8  SENSITIVE Sensitive     CEFTRIAXONE <=1 SENSITIVE Sensitive     CIPROFLOXACIN <=0.25 SENSITIVE Sensitive     GENTAMICIN <=1 SENSITIVE Sensitive     IMIPENEM 8 INTERMEDIATE Intermediate     NITROFURANTOIN 256 RESISTANT Resistant     TRIMETH/SULFA <=20 SENSITIVE Sensitive     AMPICILLIN/SULBACTAM <=2 SENSITIVE Sensitive     PIP/TAZO <=4 SENSITIVE Sensitive     * >=100,000 COLONIES/mL PROTEUS MIRABILIS    Radiology Reports Dg Chest 2 View  Result Date: 10/26/2016 CLINICAL DATA:  71 year old female with 4 weeks of cough, congestion and progressive shortness of breath. EXAM: CHEST  2 VIEW COMPARISON:  Prior chest x-ray 10/21/2010 FINDINGS: The lungs are clear and negative for focal airspace consolidation, pulmonary edema or suspicious pulmonary nodule. Chronic bronchitic changes and interstitial prominence are similar compared to prior. No pleural effusion or pneumothorax. Cardiac and  mediastinal contours are within normal limits. Atherosclerotic calcifications present in the transverse aorta. Surgical clips are present in the right upper quadrant consistent with prior cholecystectomy. No acute fracture or lytic or blastic osseous lesions. The visualized upper abdominal bowel gas pattern is unremarkable. IMPRESSION: No active cardiopulmonary disease. Stable chronic bronchitic changes and mild interstitial prominence. Electronically Signed   By: Jacqulynn Cadet M.D.   On: 10/26/2016 12:52   Ct Chest High Resolution  Result Date: 10/27/2016 CLINICAL DATA:  71 year old female with history of shortness of breath. EXAM: CT CHEST WITHOUT CONTRAST TECHNIQUE: Multidetector CT imaging of the chest was performed following the standard protocol without intravenous contrast. High resolution imaging of the lungs, as well as inspiratory and expiratory imaging, was performed. COMPARISON:  CT the chest, abdomen and pelvis 07/28/2010. FINDINGS: Cardiovascular: Heart size is normal. There is no significant pericardial  fluid, thickening or pericardial calcification. There is aortic atherosclerosis, as well as atherosclerosis of the great vessels of the mediastinum and the coronary arteries, including calcified atherosclerotic plaque in the left main, left anterior descending, left circumflex and right coronary arteries. Calcifications of the aortic valve (mild). Mediastinum/Nodes: No pathologically enlarged mediastinal or hilar lymph nodes. Please note that accurate exclusion of hilar adenopathy is limited on noncontrast CT scans. Esophagus is unremarkable in appearance. No axillary lymphadenopathy. Lungs/Pleura: Scattered throughout both lungs there are peribronchovascular areas of ground-glass attenuation with some associated thickening of the peribronchovascular interstitium, most compatible with multilobar bronchopneumonia. In the lateral aspect of the left upper lobe (axial image 45 of series 5) there is a new 12 x 11 mm irregular-shaped nodule, with some adjacent clustered peribronchovascular nodules in the periphery of the left upper lobe; this is nonspecific, but favored to represent areas of mucoid impaction. No pleural effusions. High-resolution images demonstrate no significant regions of subpleural reticulation, traction bronchiectasis or frank honeycombing. Inspiratory and expiratory imaging demonstrates some mild air trapping, indicative of small airways disease. Upper Abdomen: Diffuse low attenuation throughout the visualized hepatic parenchyma, compatible with hepatic steatosis. Musculoskeletal: There are no aggressive appearing lytic or blastic lesions noted in the visualized portions of the skeleton. IMPRESSION: 1. No evidence to suggest interstitial lung disease. 2. The appearance of the lungs is most compatible with severe multilobar bronchopneumonia. 3. New 12 x 11 mm nodule in the left upper lobe with some adjacent satellite peribronchovascular nodularity. This is favored to represent a benign area of mucoid  impaction, however, close attention on followup chest CT in 3 months is recommended to ensure the stability or resolution of this finding. This recommendation follows the consensus statement: Guidelines for Management of Incidental Pulmonary Nodules Detected on CT Images: From the Fleischner Society 2017; Radiology 2017; 284:228-243. 4. Aortic atherosclerosis, in addition to left main and 3 vessel coronary artery disease. Please note that although the presence of coronary artery calcium documents the presence of coronary artery disease, the severity of this disease and any potential stenosis cannot be assessed on this non-gated CT examination. Assessment for potential risk factor modification, dietary therapy or pharmacologic therapy may be warranted, if clinically indicated. 5. There are faint calcifications of the aortic valve. Echocardiographic correlation for evaluation of potential valvular dysfunction may be warranted if clinically indicated. Electronically Signed   By: Vinnie Langton M.D.   On: 10/27/2016 08:24   Dg Bone Density  Result Date: 10/16/2016 EXAM: DUAL X-RAY ABSORPTIOMETRY (DXA) FOR BONE MINERAL DENSITY IMPRESSION: Dear Dr Elsie Stain, Your patient Kenise Barraco completed a BMD test on 10/16/2016 using the  L[employer] Advance DXA System (analysis version: 14.10) manufactured by [employer]. The following summarizes the results of our evaluation. PATIENT BIOGRAPHICAL: Name: Ashayla, Subia Patient ID: [id number] Birth Date: [birth year] Height: 64.5 in. Gender: Female Exam Date: 10/16/2016 Weight: 239.0 lbs. Indications: Advanced Age, arthritis, Caucasian, Family History of Fracture, Family Hx of Osteoporosis, History of Fracture (Adult), Hysterectomy, osteopenia, POSTmenopausal, skin ca Fractures: RIBS, SHOULDER, toe Treatments: levothyroxin, Vitamin D, XARELTO ASSESSMENT: The BMD measured at AP Spine L1-L4 is 0.871 g/cm2 with a T-score of -2.6. This patient is considered OSTEOPOROTIC  according to [place]) criteria. Patient is not a candidate for FRAX assessment due to osteoporosis report. Site Region Measured Measured WHO Young Adult BMD Date       Age      Classification T-score AP Spine L1-L4 10/16/2016 70.3 Osteoporosis -2.6 0.871 g/cm2 DualFemur Neck Right 10/16/2016 70.2 Osteopenia -2.1 0.753 g/cm2 World Health Organization [facility]) criteria for post-menopausal, Caucasian Women: Normal:       T-score at or above -1 SD Osteopenia:   T-score between -1 and -2.5 SD Osteoporosis: T-score at or below -2.5 SD RECOMMENDATIONS: [place] recommends that FDA-approved medical therapies be considered in postemenopausal women and men age 14 or older with a: 1. Hip or vertebral (clinical or morphometric) fracture. 2. T-score of < -2.5at the spine or hip. 3. Ten-year fracture probability by FRAX of 3% or greater for hip fracture or 20% or greater for major osteoporotic fracture. All treatment decisions require clinical judgment and consideration of individual patient factors, including patient preferences, co-morbidities, previous drug use, risk factors not captured in the FRAX model (e.g. falls, vitamin D deficiency, increased bone turnover, interval significant decline in bone density) and possible under - or over-estimation of fracture risk by FRAX. All patients should ensure an adequate intake of dietary calcium (1200 mg/d) and vitamin D (800 IU daily) unless contraindicated. FOLLOW-UP: People with diagnosed cases of osteoporosis or at high risk for fracture should have regular bone mineral density tests. For patients eligible for Medicare, routine testing is allowed once every 2 years. The testing frequency can be increased to one year for patients who have rapidly progressing disease, those who are receiving or discontinuing medical therapy to restore bone mass, or have additional risk factors. I have reviewed this report, and agree with the above  findings. Mark A. Thornton Papas, M.D. [facility] Radiology Electronically Signed   By: Lavonia Dana M.D.   On: 10/16/2016 11:48   Mm Screening Breast Tomo Bilateral  Result Date: 10/16/2016 CLINICAL DATA:  Screening. EXAM: 2D DIGITAL SCREENING BILATERAL MAMMOGRAM WITH CAD AND ADJUNCT TOMO COMPARISON:  Previous exam(s). ACR Breast Density Category b: There are scattered areas of fibroglandular density. FINDINGS: There are no findings suspicious for malignancy. Images were processed with CAD. IMPRESSION: No mammographic evidence of malignancy. A result letter of this screening mammogram will be mailed directly to the patient. RECOMMENDATION: Screening mammogram in one year. (Code:SM-B-01Y) BI-RADS CATEGORY  1: Negative. Electronically Signed   By: Pamelia Hoit M.D.   On: 10/16/2016 13:46    Time Spent in minutes  30   Jani Gravel M.D on 10/28/2016 at 5:43 PM  Between 7am to 7pm - Pager - [phone]  After 7pm go to [email or web address] - password [facility]  [facility] -  Office  [phone]

## 2016-10-29 DIAGNOSIS — N3000 Acute cystitis without hematuria: Secondary | ICD-10-CM

## 2016-10-29 DIAGNOSIS — I480 Paroxysmal atrial fibrillation: Secondary | ICD-10-CM

## 2016-10-29 DIAGNOSIS — E039 Hypothyroidism, unspecified: Secondary | ICD-10-CM

## 2016-10-29 MED ORDER — CEFUROXIME AXETIL 250 MG PO TABS: 250.0000 mg | ORAL_TABLET | Freq: Two times a day (BID) | ORAL | 0 refills | Status: AC

## 2016-10-29 MED ORDER — ALBUTEROL SULFATE (2.5 MG/3ML) 0.083% IN NEBU: 3.0000 mL | INHALATION_SOLUTION | Freq: Three times a day (TID) | RESPIRATORY_TRACT | Status: DC

## 2016-10-29 MED ORDER — PREDNISONE 10 MG PO TABS: ORAL_TABLET | ORAL | 0 refills | Status: DC

## 2016-10-29 MED ORDER — LEVALBUTEROL HCL 0.63 MG/3ML IN NEBU: 0.6300 mg | INHALATION_SOLUTION | Freq: Three times a day (TID) | RESPIRATORY_TRACT | 12 refills | Status: DC | PRN

## 2016-10-29 MED ORDER — VSL#3 PO CAPS: ORAL_CAPSULE | ORAL | 0 refills | Status: DC

## 2016-10-29 MED ORDER — LEVALBUTEROL HCL 0.63 MG/3ML IN NEBU
0.6300 mg | INHALATION_SOLUTION | Freq: Three times a day (TID) | RESPIRATORY_TRACT | Status: DC | PRN
Start: 2016-10-29 — End: 2016-10-29

## 2016-10-29 MED ORDER — LORATADINE 10 MG PO TABS
10.0000 mg | ORAL_TABLET | Freq: Every day | ORAL | Status: DC
  Administered 2016-10-29: 10 mg via ORAL
  Filled 2016-10-29: qty 1

## 2016-10-29 NOTE — Care Management Note (Signed)
Case Management Note  Patient Details  Name: Marabeth Melland MRN: [id number] Date of Birth: July 24, 1945  Subjective/Objective:                  Acute hypoxemic respiratory failure secondary to pneumonia  Action/Plan: Discharge planning Expected Discharge Date:  10/29/16               Expected Discharge Plan:  Home/Self Care  In-House Referral:     Discharge planning Services  CM Consult  Post Acute Care Choice:  Durable Medical Equipment Choice offered to:  Patient  DME Arranged:  Nebulizer machine DME Agency:  [place] Arranged:  NA [place] Agency:  NA  Status of Service:  Completed, signed off  If discussed at [place] of Stay Meetings, dates discussed:    Additional Comments: CM received call from RN for a neb machine for pt. CM notified [place] DME rep, Reggie to please deliver to the room so pt can discharge. No other CM needs were communicated. Dellie Catholic, RN 10/29/2016, 11:41 AM

## 2016-10-29 NOTE — Progress Notes (Signed)
Discharged to home. Left unit via wheelchair, accompanied by family and patient care technician. Verbalized understanding of all written and verbal instructions.

## 2016-10-29 NOTE — Discharge Summary (Signed)
Kara Wright, is a 71 y.o. female  DOB 1945-07-01  MRN [id number].  Admission date:  10/26/2016  Admitting Physician  Reyne Dumas, MD  Discharge Date:  10/29/2016   Primary MD  Tonia Ghent, MD  Recommendations for primary care physician for things to follow:    Acute hypoxemic respiratory failure secondary to pneumonia ? Possible Copd -CT high-resolution without ILD findings  -Echocardiogram with normal EF and diastolic function  -D-dimer negative, low suspicion for PE  -Possible component of COPD, although no previous diagnosis.  Would recommend outpatient pulmonary function test   Multilobar bronchopneumonia, possible COPD exacerbation Prednisone 40mg  po qday x2 days then 30mg  po qday x 2 days then 20mg  po qday x 2 days then 10mg  po qday x 2 days Cefuroxime 250mg  po bid x 4 days   Uti -Urine culture >proteus sensitive to keflex. =>Cefuroxime  12 x 11 mm nodule in the left upper lobe nodule -Likely benign area of mucoid impaction but would recommend follow up chest CT in 3 months   Paroxysmal atrial fibrillation -States that she only takes Cardizem as needed and not on a daily basis -Continue propanolol, Xarelto  Hypothyroidism -Continue Synthroid  Hyperlipidemia -Continue Crestor  GERD -Continue PPI  Mild MR f/u with cardiology   Admission Diagnosis  SOB (shortness of breath) [R06.02]   Discharge Diagnosis  SOB (shortness of breath) [R06.02]    Principal Problem:   Acute hypoxemic respiratory failure (HCC) Active Problems:   Hypothyroidism   HLD (hyperlipidemia)   GERD (gastroesophageal reflux disease)   Atrial fibrillation (HCC)   HTN (hypertension)   COPD with acute exacerbation (HCC)   UTI (urinary tract infection)      Past Medical History:  Diagnosis Date  . Atrial fib/flutter, transient    1 episode, resolved, never on coumadin  .  Cancer ([place])    skin cancer  . Diverticulosis 03/1997  . Dysrhythmia    atrial fibrilation  . GERD (gastroesophageal reflux disease) 1997   good on prilosec  . Hepatitis    hx of hep B  . Hyperlipidemia 08/1996  . Hypothyroidism 1984  . Migraine    w/o aura  . Osteopenia 11/2002   via dexa  . Stroke [facility]) 08/2011   TIA    Past Surgical History:  Procedure Laterality Date  . ABDOMINAL HYSTERECTOMY    . ANAL FISSURE REPAIR    . CHOLECYSTECTOMY  07/2006  . 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 nasolabial fold skin flap surgery  08/02/2007   [facility]  . PARTIAL HYSTERECTOMY  1978-79  . POLYPECTOMY  04/06/97   colon, benign by pathology  . varicose vein ablation sclerotherapy  09/2008   Dr. Hulda Humphrey       HPI  from the history and physical done on the day of admission:   71 year old female with a history of hypothyroidism, gastroesophageal reflux, atrial fibrillation on anticoagulation, dyslipidemia, who presents to the ER with a chief  complaint of dyspnea on exertion, worsening over the last 3 months, symptoms acutely worsened over the last 3 days. Patient was seen in urgent care on 10/21/16 and started on doxycycline and Tussinex. Patient has had no improvement in her symptoms, she presented to the ER today with shortness of breath. In the emergency department, she was found to have expiratory wheezes with negative chest x-ray. She was admitted for further treatment for acute bronchitis, acute respiratory failure.      Hospital Course:      Pt was tx with solumedrol , xopenex neb and levaquin initially x2 doses and converted to rocephin 5/11 CXR 5/10=>No active cardiopulmonary disease.  Stable chronic bronchitic changes and mild interstitial prominence. High Resolution CT chest 5/11 =>   1. No evidence to suggest interstitial lung disease. 2. The appearance of the lungs is most compatible with severe multilobar bronchopneumonia. 3. New 12 x 11  mm nodule in the left upper lobe with some adjacent satellite peribronchovascular nodularity. This is favored to represent a benign area of mucoid impaction, however, close attention on followup chest CT in 3 months is recommended to ensure the stability or resolution of this finding. This recommendation follows the consensus statement: Guidelines for Management of Incidental Pulmonary Nodules Detected on CT Images: From the Fleischner Society 2017; Radiology 2017; 284:228-243. 4. Aortic atherosclerosis, in addition to left main and 3 vessel coronary artery disease. Please note that although the presence of coronary artery calcium documents the presence of coronary artery disease, the severity of this disease and any potential stenosis cannot be assessed on this non-gated CT examination. Assessment for potential risk factor modification, dietary therapy or pharmacologic therapy may be warranted, if clinically indicated. 5. There are faint calcifications of the aortic valve. Echocardiographic correlation for evaluation of potential valvular dysfunction may be warranted if clinically indicated.  Cardiac echo 10/27/2016=> EF 60-65%, mild MR  Pt improved and was converted to po prednisone.  Urine culture 5/10  => proteus, sensitive to cefazolin/ceftriaxone.  Pt feels like her breathing is better and will be discharge to home.      Follow UP  Follow-up Information    Tonia Ghent, MD Follow up in 1 week(s).   Specialty:  Family Medicine Contact information: [place] [address] [phone]            Consults obtained - none  Discharge Condition:  stable  Diet and Activity recommendation: See Discharge Instructions below  Discharge Instructions         Discharge Medications     Allergies as of 10/29/2016      Reactions   Celecoxib    REACTION: nausea: GI UPSET   Gold-containing Drug Products    Nickel    Skin rash, allergy   Other      Fragrance, phenylenediamine, contrast metal agents   Simvastatin    Aches but tolerates crestor      Medication List    STOP taking these medications   doxycycline 100 MG capsule Commonly known as:  VIBRAMYCIN     TAKE these medications   cefUROXime 250 MG tablet Commonly known as:  CEFTIN Take 1 tablet (250 mg total) by mouth 2 (two) times daily.   chlorpheniramine-HYDROcodone 10-8 MG/5ML Suer Commonly known as:  TUSSIONEX PENNKINETIC ER Take 5 mLs by mouth every 12 (twelve) hours as needed.   diltiazem 30 MG tablet Commonly known as:  CARDIZEM Take 1 tablet (30 mg total) by mouth 3 (three) times  daily as needed. What changed:  reasons to take this   levalbuterol 0.63 MG/3ML nebulizer solution Commonly known as:  XOPENEX Take 3 mLs (0.63 mg total) by nebulization 3 (three) times daily as needed for wheezing or shortness of breath.   levothyroxine 150 MCG tablet Commonly known as:  SYNTHROID, LEVOTHROID Take 1 tablet (150 mcg total) by mouth daily. Except for 1.5 on Sunday   loratadine 10 MG tablet Commonly known as:  CLARITIN Take 1 tablet (10 mg total) by mouth daily as needed for rhinitis or itching.   omeprazole 20 MG capsule Commonly known as:  PRILOSEC Take 1 capsule (20 mg total) by mouth daily.   predniSONE 10 MG tablet Commonly known as:  DELTASONE 40mg  po qday x 2 days then 30mg  po qday x 2 days then 20mg  po qday x 2 days then 10mg  po qday x 2 days   promethazine 25 MG tablet Commonly known as:  PHENERGAN Take 1 tablet (25 mg total) by mouth every 6 (six) hours as needed. For nausea   propranolol ER 120 MG 24 hr capsule Commonly known as:  INDERAL LA TAKE ONE CAPSULE BY MOUTH DAILY What changed:  See the new instructions.   rosuvastatin 10 MG tablet Commonly known as:  CRESTOR Take 1 tablet (10 mg total) by mouth daily. What changed:  how much to take  when to take this   Vitamin D (Ergocalciferol) 50000 units Caps capsule Commonly known  as:  DRISDOL Take 1 capsule (50,000 Units total) by mouth every 7 (seven) days. What changed:  additional instructions   VSL#3 Caps 1 capsule po qday (Probiotic)   XARELTO 20 MG Tabs tablet Generic drug:  rivaroxaban TAKE ONE (1) TABLET BY MOUTH EVERY DAY WITH SUPPER What changed:  See the new instructions.            Durable Medical Equipment        Start     Ordered   10/29/16 [phone]  For home use only DME Nebulizer machine  Once    Question:  Patient needs a nebulizer to treat with the following condition  Answer:  Bronchitis, acute, with bronchospasm   10/29/16 5956      Major procedures and Radiology Reports - PLEASE review detailed and final reports for all details, in brief -      Dg Chest 2 View  Result Date: 10/26/2016 CLINICAL DATA:  71 year old female with 4 weeks of cough, congestion and progressive shortness of breath. EXAM: CHEST  2 VIEW COMPARISON:  Prior chest x-ray 10/21/2010 FINDINGS: The lungs are clear and negative for focal airspace consolidation, pulmonary edema or suspicious pulmonary nodule. Chronic bronchitic changes and interstitial prominence are similar compared to prior. No pleural effusion or pneumothorax. Cardiac and mediastinal contours are within normal limits. Atherosclerotic calcifications present in the transverse aorta. Surgical clips are present in the right upper quadrant consistent with prior cholecystectomy. No acute fracture or lytic or blastic osseous lesions. The visualized upper abdominal bowel gas pattern is unremarkable. IMPRESSION: No active cardiopulmonary disease. Stable chronic bronchitic changes and mild interstitial prominence. Electronically Signed   By: Jacqulynn Cadet M.D.   On: 10/26/2016 12:52   Ct Chest High Resolution  Result Date: 10/27/2016 CLINICAL DATA:  71 year old female with history of shortness of breath. EXAM: CT CHEST WITHOUT CONTRAST TECHNIQUE: Multidetector CT imaging of the chest was performed following  the standard protocol without intravenous contrast. High resolution imaging of the lungs, as well as inspiratory and expiratory imaging,  was performed. COMPARISON:  CT the chest, abdomen and pelvis 07/28/2010. FINDINGS: Cardiovascular: Heart size is normal. There is no significant pericardial fluid, thickening or pericardial calcification. There is aortic atherosclerosis, as well as atherosclerosis of the great vessels of the mediastinum and the coronary arteries, including calcified atherosclerotic plaque in the left main, left anterior descending, left circumflex and right coronary arteries. Calcifications of the aortic valve (mild). Mediastinum/Nodes: No pathologically enlarged mediastinal or hilar lymph nodes. Please note that accurate exclusion of hilar adenopathy is limited on noncontrast CT scans. Esophagus is unremarkable in appearance. No axillary lymphadenopathy. Lungs/Pleura: Scattered throughout both lungs there are peribronchovascular areas of ground-glass attenuation with some associated thickening of the peribronchovascular interstitium, most compatible with multilobar bronchopneumonia. In the lateral aspect of the left upper lobe (axial image 45 of series 5) there is a new 12 x 11 mm irregular-shaped nodule, with some adjacent clustered peribronchovascular nodules in the periphery of the left upper lobe; this is nonspecific, but favored to represent areas of mucoid impaction. No pleural effusions. High-resolution images demonstrate no significant regions of subpleural reticulation, traction bronchiectasis or frank honeycombing. Inspiratory and expiratory imaging demonstrates some mild air trapping, indicative of small airways disease. Upper Abdomen: Diffuse low attenuation throughout the visualized hepatic parenchyma, compatible with hepatic steatosis. Musculoskeletal: There are no aggressive appearing lytic or blastic lesions noted in the visualized portions of the skeleton. IMPRESSION: 1. No  evidence to suggest interstitial lung disease. 2. The appearance of the lungs is most compatible with severe multilobar bronchopneumonia. 3. New 12 x 11 mm nodule in the left upper lobe with some adjacent satellite peribronchovascular nodularity. This is favored to represent a benign area of mucoid impaction, however, close attention on followup chest CT in 3 months is recommended to ensure the stability or resolution of this finding. This recommendation follows the consensus statement: Guidelines for Management of Incidental Pulmonary Nodules Detected on CT Images: From the Fleischner Society 2017; Radiology 2017; 284:228-243. 4. Aortic atherosclerosis, in addition to left main and 3 vessel coronary artery disease. Please note that although the presence of coronary artery calcium documents the presence of coronary artery disease, the severity of this disease and any potential stenosis cannot be assessed on this non-gated CT examination. Assessment for potential risk factor modification, dietary therapy or pharmacologic therapy may be warranted, if clinically indicated. 5. There are faint calcifications of the aortic valve. Echocardiographic correlation for evaluation of potential valvular dysfunction may be warranted if clinically indicated. Electronically Signed   By: Vinnie Langton M.D.   On: 10/27/2016 08:24   Dg Bone Density  Result Date: 10/16/2016 EXAM: DUAL X-RAY ABSORPTIOMETRY (DXA) FOR BONE MINERAL DENSITY IMPRESSION: Dear Dr Elsie Stain, Your patient Holden Draughon completed a BMD test on 10/16/2016 using the [place] (analysis version: 14.10) manufactured by [employer]. The following summarizes the results of our evaluation. PATIENT BIOGRAPHICAL: Name: Leahna, Hewson Patient ID: [id number] Birth Date: [birth year] Height: 64.5 in. Gender: Female Exam Date: 10/16/2016 Weight: 239.0 lbs. Indications: Advanced Age, arthritis, Caucasian, Family History of Fracture, Family Hx of  Osteoporosis, History of Fracture (Adult), Hysterectomy, osteopenia, POSTmenopausal, skin ca Fractures: RIBS, SHOULDER, toe Treatments: levothyroxin, Vitamin D, XARELTO ASSESSMENT: The BMD measured at AP Spine L1-L4 is 0.871 g/cm2 with a T-score of -2.6. This patient is considered OSTEOPOROTIC according to [facility]) criteria. Patient is not a candidate for FRAX assessment due to osteoporosis report. Site Region Measured Measured WHO Young Adult BMD Date  Age      Classification T-score AP Spine L1-L4 10/16/2016 70.2 Osteoporosis -2.6 0.871 g/cm2 DualFemur Neck Right 10/16/2016 70.2 Osteopenia -2.1 0.753 g/cm2 World Health Organization [facility]) criteria for post-menopausal, Caucasian Women: Normal:       T-score at or above -1 SD Osteopenia:   T-score between -1 and -2.5 SD Osteoporosis: T-score at or below -2.5 SD RECOMMENDATIONS: [place] recommends that FDA-approved medical therapies be considered in postemenopausal women and men age 83 or older with a: 1. Hip or vertebral (clinical or morphometric) fracture. 2. T-score of < -2.5at the spine or hip. 3. Ten-year fracture probability by FRAX of 3% or greater for hip fracture or 20% or greater for major osteoporotic fracture. All treatment decisions require clinical judgment and consideration of individual patient factors, including patient preferences, co-morbidities, previous drug use, risk factors not captured in the FRAX model (e.g. falls, vitamin D deficiency, increased bone turnover, interval significant decline in bone density) and possible under - or over-estimation of fracture risk by FRAX. All patients should ensure an adequate intake of dietary calcium (1200 mg/d) and vitamin D (800 IU daily) unless contraindicated. FOLLOW-UP: People with diagnosed cases of osteoporosis or at high risk for fracture should have regular bone mineral density tests. For patients eligible for Medicare, routine testing is allowed  once every 2 years. The testing frequency can be increased to one year for patients who have rapidly progressing disease, those who are receiving or discontinuing medical therapy to restore bone mass, or have additional risk factors. I have reviewed this report, and agree with the above findings. Mark A. Thornton Papas, M.D. [facility] Radiology Electronically Signed   By: Lavonia Dana M.D.   On: 10/16/2016 11:48   Mm Screening Breast Tomo Bilateral  Result Date: 10/16/2016 CLINICAL DATA:  Screening. EXAM: 2D DIGITAL SCREENING BILATERAL MAMMOGRAM WITH CAD AND ADJUNCT TOMO COMPARISON:  Previous exam(s). ACR Breast Density Category b: There are scattered areas of fibroglandular density. FINDINGS: There are no findings suspicious for malignancy. Images were processed with CAD. IMPRESSION: No mammographic evidence of malignancy. A result letter of this screening mammogram will be mailed directly to the patient. RECOMMENDATION: Screening mammogram in one year. (Code:SM-B-01Y) BI-RADS CATEGORY  1: Negative. Electronically Signed   By: Pamelia Hoit M.D.   On: 10/16/2016 13:46    Micro Results     Recent Results (from the past 240 hour(s))  Urine culture     Status: Abnormal   Collection Time: 10/26/16  2:26 PM  Result Value Ref Range Status   Specimen Description URINE, RANDOM  Final   Special Requests NONE  Final   Culture >=100,000 COLONIES/mL PROTEUS MIRABILIS (A)  Final   Report Status 10/28/2016 FINAL  Final   Organism ID, Bacteria PROTEUS MIRABILIS (A)  Final      Susceptibility   Proteus mirabilis - MIC*    AMPICILLIN <=2 SENSITIVE Sensitive     CEFAZOLIN 8 SENSITIVE Sensitive     CEFTRIAXONE <=1 SENSITIVE Sensitive     CIPROFLOXACIN <=0.25 SENSITIVE Sensitive     GENTAMICIN <=1 SENSITIVE Sensitive     IMIPENEM 8 INTERMEDIATE Intermediate     NITROFURANTOIN 256 RESISTANT Resistant     TRIMETH/SULFA <=20 SENSITIVE Sensitive     AMPICILLIN/SULBACTAM <=2 SENSITIVE Sensitive     PIP/TAZO <=4 SENSITIVE  Sensitive     * >=100,000 COLONIES/mL PROTEUS MIRABILIS       Today   Subjective    Adeja Sarratt today has feels like her breathing has  improved.  Not requiring o2. Afebrile,  No cough,  Breathing ok.  Slight wheeze, upper respiratory.  No headache,no chest abdominal pain,no new weakness tingling or numbness, feels much better wants to go home today.    Objective   Blood pressure (!) 146/70, pulse (!) 59, temperature 98.4 F (36.9 C), temperature source Oral, resp. rate 20, height 5\' 5"  (1.651 m), weight 107 kg (236 lb), SpO2 93 %.   Intake/Output Summary (Last 24 hours) at 10/29/16 0941 Last data filed at 10/29/16 0300  Gross per 24 hour  Intake              820 ml  Output             1350 ml  Net             -530 ml    Exam Awake Alert, Oriented x 3, No new F.N deficits, Normal affect [place].AT,PERRAL Supple Neck,No JVD, No cervical lymphadenopathy appriciated.  Symmetrical Chest wall movement, Good air movement bilaterally, CTAB except for mild exp wheeze which is upper respiratory.  RRR,No Gallops,Rubs or new Murmurs, No Parasternal Heave +ve B.Sounds, Abd Soft, Non tender, No organomegaly appriciated, No rebound -guarding or rigidity. No Cyanosis, Clubbing or edema, No new Rash or bruise   Data Review   CBC w Diff: Lab Results  Component Value Date   WBC 6.8 10/28/2016   HGB 12.9 10/28/2016   HGB 13.8 08/26/2011   HCT 40.7 10/28/2016   HCT 41.2 08/26/2011   PLT 153 10/28/2016   PLT 156 08/26/2011   LYMPHOPCT 31.5 07/27/2016   MONOPCT 7.5 07/27/2016   EOSPCT 3.3 07/27/2016   BASOPCT 0.7 07/27/2016    CMP: Lab Results  Component Value Date   NA 140 10/28/2016   NA 139 08/26/2011   K 3.5 10/28/2016   K 3.8 08/26/2011   CL 103 10/28/2016   CL 100 08/26/2011   CO2 31 10/28/2016   CO2 26 08/26/2011   BUN 15 10/28/2016   BUN 12 08/26/2011   CREATININE 0.79 10/28/2016   CREATININE 0.80 08/26/2011   PROT 6.9 10/27/2016   PROT 6.8 03/19/2012   PROT 7.5  08/26/2011   ALBUMIN 3.5 10/27/2016   ALBUMIN 4.4 03/19/2012   ALBUMIN 3.9 08/26/2011   BILITOT 0.9 10/27/2016   BILITOT 0.6 08/26/2011   ALKPHOS 64 10/27/2016   ALKPHOS 75 08/26/2011   AST 19 10/27/2016   AST 25 08/26/2011   ALT 16 10/27/2016   ALT 27 08/26/2011  .   Total Time in preparing paper work, data evaluation and todays exam - 55 minutes  Jani Gravel M.D on 10/29/2016 at 9:41 AM  [facility]   Office  [phone]

## 2016-10-31 ENCOUNTER — Ambulatory Visit (INDEPENDENT_AMBULATORY_CARE_PROVIDER_SITE_OTHER): Payer: PPO | Admitting: Family Medicine

## 2016-10-31 ENCOUNTER — Encounter: Payer: Self-pay | Admitting: Family Medicine

## 2016-10-31 VITALS — BP 172/86 | HR 67 | Temp 98.5°F | Wt 230.0 lb

## 2016-10-31 DIAGNOSIS — I2510 Atherosclerotic heart disease of native coronary artery without angina pectoris: Secondary | ICD-10-CM | POA: Diagnosis not present

## 2016-10-31 DIAGNOSIS — E7800 Pure hypercholesterolemia, unspecified: Secondary | ICD-10-CM | POA: Diagnosis not present

## 2016-10-31 DIAGNOSIS — K219 Gastro-esophageal reflux disease without esophagitis: Secondary | ICD-10-CM | POA: Diagnosis not present

## 2016-10-31 DIAGNOSIS — N3000 Acute cystitis without hematuria: Secondary | ICD-10-CM | POA: Diagnosis not present

## 2016-10-31 DIAGNOSIS — I480 Paroxysmal atrial fibrillation: Secondary | ICD-10-CM

## 2016-10-31 DIAGNOSIS — J9601 Acute respiratory failure with hypoxia: Secondary | ICD-10-CM | POA: Diagnosis not present

## 2016-10-31 DIAGNOSIS — M810 Age-related osteoporosis without current pathological fracture: Secondary | ICD-10-CM

## 2016-10-31 MED ORDER — ALBUTEROL SULFATE HFA 108 (90 BASE) MCG/ACT IN AERS: 1.0000 | INHALATION_SPRAY | Freq: Four times a day (QID) | RESPIRATORY_TRACT | 0 refills | Status: DC | PRN

## 2016-10-31 NOTE — Progress Notes (Signed)
Admission date:  10/26/2016  Admitting Physician  Reyne Dumas, MD  Discharge Date:  10/29/2016   Acute hypoxemic respiratory failure secondary to pneumonia ? Possible Copd -CT high-resolution without ILD findings  -Echocardiogram with normal EF and diastolic function  -D-dimer negative, low suspicion for PE  -Possible component of COPD, although no previous diagnosis.  Would recommend outpatient pulmonary function test   Multilobar bronchopneumonia, possible COPD exacerbation Prednisone 40mg  po qday x2 days then 30mg  po qday x 2 days then 20mg  po qday x 2 days then 10mg  po qday x 2 days Cefuroxime 250mg  po bid x 4 days   Uti -Urine culture >proteus sensitive to keflex. =>Cefuroxime  12 x 11 mm nodule in the left upper lobe nodule -Likely benign area of mucoid impaction but would recommend follow up chest CT in 3 months   Paroxysmal atrial fibrillation -States that she only takes Cardizem as needed and not on a daily basis -Continue propanolol, Xarelto  Hypothyroidism -Continue Synthroid  Hyperlipidemia -Continue Crestor  GERD -Continue PPI  Mild MR f/u with cardiology   Admission Diagnosis  SOB (shortness of breath) [R06.02]   Discharge Diagnosis  SOB (shortness of breath) [R06.02]    Principal Problem:   Acute hypoxemic respiratory failure (HCC) Active Problems:   Hypothyroidism   HLD (hyperlipidemia)   GERD (gastroesophageal reflux disease)   Atrial fibrillation (HCC)   HTN (hypertension)   COPD with acute exacerbation (HCC)   UTI (urinary tract infection)          Past Medical History:  Diagnosis Date  . Atrial fib/flutter, transient    1 episode, resolved, never on coumadin  . Cancer ([place])    skin cancer  . Diverticulosis 03/1997  . Dysrhythmia    atrial fibrilation  . GERD (gastroesophageal reflux disease) 1997   good on prilosec  . Hepatitis    hx of hep B  . Hyperlipidemia 08/1996  . Hypothyroidism 1984  .  Migraine    w/o aura  . Osteopenia 11/2002   via dexa  . Stroke [facility]) 08/2011   TIA         Past Surgical History:  Procedure Laterality Date  . ABDOMINAL HYSTERECTOMY    . ANAL FISSURE REPAIR    . CHOLECYSTECTOMY  07/2006  . 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 nasolabial fold skin flap surgery  08/02/2007   [facility]  . PARTIAL HYSTERECTOMY  1978-79  . POLYPECTOMY  04/06/97   colon, benign by pathology  . varicose vein ablation sclerotherapy  09/2008   Dr. Hulda Humphrey       HPI  from the history and physical done on the day of admission:   71 year old female with a history of hypothyroidism, gastroesophageal reflux, atrial fibrillation on anticoagulation, dyslipidemia, who presents to the ER with a chief complaint of dyspnea on exertion, worsening over the last 3 months, symptoms acutely worsened over the last 3 days. Patient was seen in urgent care on 10/21/16 and started on doxycycline and Tussinex. Patient has had no improvement in her symptoms, she presented to the ER today with shortness of breath. In the emergency department, she was found to have expiratory wheezes with negative chest x-ray. She was admitted for further treatment for acute bronchitis, acute respiratory failure.      Hospital Course:      Pt was tx with solumedrol , xopenex neb and levaquin initially x2 doses and converted to rocephin 5/11 CXR 5/10=>No active  cardiopulmonary disease.  Stable chronic bronchitic changes and mild interstitial prominence. High Resolution CT chest 5/11 =>   1. No evidence to suggest interstitial lung disease. 2. The appearance of the lungs is most compatible with severe multilobar bronchopneumonia. 3. New 12 x 11 mm nodule in the left upper lobe with some adjacent satellite peribronchovascular nodularity. This is favored to represent a benign area of mucoid impaction, however, close attention on followup chest CT in 3  months is recommended to ensure the stability or resolution of this finding. This recommendation follows the consensus statement: Guidelines for Management of Incidental Pulmonary Nodules Detected on CT Images: From the Fleischner Society 2017; Radiology 2017; 284:228-243. 4. Aortic atherosclerosis, in addition to left main and 3 vessel coronary artery disease. Please note that although the presence of coronary artery calcium documents the presence of coronary artery disease, the severity of this disease and any potential stenosis cannot be assessed on this non-gated CT examination. Assessment for potential risk factor modification, dietary therapy or pharmacologic therapy may be warranted, if clinically indicated. 5. There are faint calcifications of the aortic valve. Echocardiographic correlation for evaluation of potential valvular dysfunction may be warranted if clinically indicated.  Cardiac echo 10/27/2016=> EF 60-65%, mild MR  Pt improved and was converted to po prednisone.  Urine culture 5/10  => proteus, sensitive to cefazolin/ceftriaxone.  Pt feels like her breathing is better and will be discharge to home.    ==================================================  The above was discussed with patient. Admitted with respiratory symptoms. Likely multifocal pneumonia seen. She had abnormality on CT that will require follow-up imaging in 3 months. Discussed with patient. Incidental coronary calcifications noted on CT. She is not having chest pain. Also with concurrent UTI, treated.  In the meantime she is finishing prednisone taper and antibiotics. She does not feel back to baseline but she clearly feels better than she did. She can take a deep breath more easily than previous. No fevers. Decreasing cough. Compliant with medications, Crestor, levothyroxine, PPI.  She has not yet used albuterol. She could not afford Xopenex. Discussed with patient about A. fib and albuterol.  She was  originally coming in to talk about osteoporosis with and she gets sick and required admission. This is deferred for now. Discussed with patient. She agrees.  PMH and SH reviewed  ROS: Per HPI unless specifically indicated in ROS section   Meds, vitals, and allergies reviewed.   GEN: nad, alert and oriented HEENT: mucous membranes moist NECK: supple w/o LA CV: rrr. PULM: ctab, no inc wob ABD: soft, +bs EXT: no edema SKIN: no acute rash

## 2016-10-31 NOTE — Patient Instructions (Signed)
Finish the antibiotics and the prednisone taper.  Take prednisone with food.  Use albuterol only if needed- it may increase your heart rate.  I'll check on getting the follow up testing set up.  I'll check with cardiology in the meantime.  Take care.  Glad to see you.  Update me about your breathing in about 1 week.

## 2016-11-01 ENCOUNTER — Encounter: Payer: Self-pay | Admitting: Family Medicine

## 2016-11-01 DIAGNOSIS — I25118 Atherosclerotic heart disease of native coronary artery with other forms of angina pectoris: Secondary | ICD-10-CM | POA: Insufficient documentation

## 2016-11-01 NOTE — Assessment & Plan Note (Signed)
Improved. Finish antibiotic.

## 2016-11-01 NOTE — Assessment & Plan Note (Signed)
Continue PPI ?

## 2016-11-01 NOTE — Assessment & Plan Note (Signed)
Continue statin. 

## 2016-11-01 NOTE — Assessment & Plan Note (Signed)
I'll check with cardiology about this but she is already on statin and does not have exertional chest pain. I think her blood pressure elevation today is likely related to the recent illness and steroid use. Okay for outpatient follow-up.

## 2016-11-01 NOTE — Assessment & Plan Note (Addendum)
Clearly improved. Finish antibiotics. Finish prednisone. Steroid cautions given. She may end up needing pulmonary function testing later on. It will depend on her symptoms. It would not make sense to test her at this point as she would likely get a false positive. If she has no residual symptoms, then she may end up not needing PFTs.  History of abnormal CT noted. She will need follow-up CT in 3 months. Discussed with patient.  Okay to use albuterol if needed in the meantime but risk of A. fib exacerbation discussed with patient. She understood.

## 2016-11-01 NOTE — Assessment & Plan Note (Signed)
See above

## 2016-11-01 NOTE — Assessment & Plan Note (Signed)
Defer discussion about treatment at this point. She agrees.

## 2016-11-03 ENCOUNTER — Ambulatory Visit: Payer: PPO | Admitting: Family Medicine

## 2016-11-08 ENCOUNTER — Telehealth: Payer: Self-pay

## 2016-11-08 DIAGNOSIS — I2510 Atherosclerotic heart disease of native coronary artery without angina pectoris: Secondary | ICD-10-CM

## 2016-11-08 NOTE — Telephone Encounter (Signed)
Spoke w/ pt. Advised her of Dr. Donivan Scull recommendation. She would like to think about it and call back if she decides to proceed w/ stress test.

## 2016-11-08 NOTE — Telephone Encounter (Signed)
-----   Message from Minna Merritts, MD sent at 11/08/2016  5:14 PM EDT ----- Can we call the patient and see if she is willing to be scheduled for stress test, Myoview She has significant three-vessel coronary disease seen on recent CT scan I discussed this with her primary care thx TGollan  ----- Message ----- From: Tonia Ghent, MD Sent: 11/08/2016  10:18 AM To: Minna Merritts, MD  Please contact her and see if she is interested in proceeding.  I appreciate your input, as always.   Thanks.  Brigitte Pulse  ----- Message ----- From: Minna Merritts, MD Sent: 11/07/2016   7:03 PM To: Tonia Ghent, MD  I reviewed her images She actually has significant three-vessel coronary calcification, particularly bad in the LAD Not much in the aorta, 1 region of plaque in the arch Given three-vessel disease she may benefit from stress testing She has prior history of atrial fibrillation and tachycardia Perhaps might provide a baseline. Would probably aim for a nuclear stress  If you would like we can call her to see if she is interested thx TGollan  ----- Message ----- From: Tonia Ghent, MD Sent: 11/01/2016   1:29 PM To: Minna Merritts, MD  I need your input. She has incidental coronary disease noted on chest CT. She does not have angina. Already on statin. I wasn't planning on doing anything extra about this unless you advised otherwise. Thanks.

## 2016-11-09 NOTE — Telephone Encounter (Signed)
Returned call to patient and got her scheduled for 11/16/16, arrival time of 08:45 am. She verbalized understanding of the following instructions.  [place]  Your caregiver has ordered a Stress Test with nuclear imaging. The purpose of this test is to evaluate the blood supply to your heart muscle. This procedure is referred to as a "Non-Invasive Stress Test." This is because other than having an IV started in your vein, nothing is inserted or "invades" your body. Cardiac stress tests are done to find areas of poor blood flow to the heart by determining the extent of coronary artery disease (CAD). Some patients exercise on a treadmill, which naturally increases the blood flow to your heart, while others who are  unable to walk on a treadmill due to physical limitations have a pharmacologic/chemical stress agent called Lexiscan . This medicine will mimic walking on a treadmill by temporarily increasing your coronary blood flow.   Please note: these test may take anywhere between 2-4 hours to complete  PLEASE REPORT TO [place] AT THE FIRST DESK WILL DIRECT YOU WHERE TO GO  Date of Procedure:_______05/31/18________________  Arrival Time for Procedure:________08:45 am___________  Instructions regarding medication:    _X__:  Hold PROPRANOLOL night before procedure   PLEASE NOTIFY THE OFFICE AT LEAST 24 HOURS IN ADVANCE IF YOU ARE UNABLE TO KEEP YOUR APPOINTMENT.  [phone] AND  PLEASE NOTIFY NUCLEAR MEDICINE AT [facility] AT LEAST 24 HOURS IN ADVANCE IF YOU ARE UNABLE TO KEEP YOUR APPOINTMENT. [phone]  How to prepare for your Myoview test:  1. Do not eat or drink after midnight 2. No caffeine for 24 hours prior to test 3. No smoking 24 hours prior to test. 4. Your medication may be taken with water.  If your doctor stopped a medication because of this test, do not take that medication. 5. Ladies, please do not wear dresses.  Skirts or pants are  appropriate. Please wear a short sleeve shirt. 6. No perfume, cologne or lotion. 7. Wear comfortable walking shoes. No heels!

## 2016-11-09 NOTE — Telephone Encounter (Signed)
Pt calling back stating she "doesn't want to but will go ahead" and do the stress test  Please call back to schedule

## 2016-11-16 ENCOUNTER — Encounter
Admission: RE | Admit: 2016-11-16 | Discharge: 2016-11-16 | Disposition: A | Discharge status: Home / Self Care | Payer: PPO | Source: Ambulatory Visit | Attending: Cardiovascular Disease | Admitting: Cardiovascular Disease

## 2016-11-16 DIAGNOSIS — I2510 Atherosclerotic heart disease of native coronary artery without angina pectoris: Secondary | ICD-10-CM | POA: Diagnosis not present

## 2016-11-16 LAB — NM MYOCAR MULTI W/SPECT W/WALL MOTION / EF
CHL CUP MPHR: 150 {beats}/min
CHL CUP NUCLEAR SDS: 1
CHL CUP NUCLEAR SRS: 1
Estimated workload: 1 METS
Exercise duration (min): 0 min
Exercise duration (sec): 0 s
LVDIAVOL: 49 mL (ref 46–106)
LVSYSVOL: 12 mL
NUC STRESS TID: 1.38
Peak HR: 90 {beats}/min
Percent HR: 60 %
Rest HR: 77 {beats}/min
SSS: 1

## 2016-11-16 MED ORDER — TECHNETIUM TC 99M TETROFOSMIN IV KIT
32.4870 | PACK | Freq: Once | INTRAVENOUS | Status: AC | PRN
  Administered 2016-11-16: 32.487 via INTRAVENOUS

## 2016-11-16 MED ORDER — REGADENOSON 0.4 MG/5ML IV SOLN
0.4000 mg | Freq: Once | INTRAVENOUS | Status: AC
  Administered 2016-11-16: 0.4 mg via INTRAVENOUS

## 2016-11-16 MED ORDER — TECHNETIUM TC 99M TETROFOSMIN IV KIT
13.0000 | PACK | Freq: Once | INTRAVENOUS | Status: AC | PRN
  Administered 2016-11-16: 13.86 via INTRAVENOUS

## 2016-11-21 ENCOUNTER — Telehealth: Payer: Self-pay | Admitting: Family Medicine

## 2016-11-21 NOTE — Telephone Encounter (Signed)
Appt scheduled

## 2016-11-21 NOTE — Telephone Encounter (Signed)
Needs OV if at all possible.  Thanks.

## 2016-11-21 NOTE — Telephone Encounter (Signed)
PT called because she believes that she has a UTI.  She wants to know if she can come in and give a urine sample and then get something called in.  Can you please advise.

## 2016-11-23 ENCOUNTER — Ambulatory Visit (INDEPENDENT_AMBULATORY_CARE_PROVIDER_SITE_OTHER): Payer: PPO | Admitting: Family Medicine

## 2016-11-23 ENCOUNTER — Encounter: Payer: Self-pay | Admitting: Family Medicine

## 2016-11-23 VITALS — BP 142/80 | HR 71 | Temp 98.4°F | Wt 234.8 lb

## 2016-11-23 DIAGNOSIS — R300 Dysuria: Secondary | ICD-10-CM

## 2016-11-23 LAB — POC URINALSYSI DIPSTICK (AUTOMATED)
Bilirubin, UA: NEGATIVE
Blood, UA: NEGATIVE
GLUCOSE UA: NEGATIVE
Ketones, UA: NEGATIVE
LEUKOCYTES UA: NEGATIVE
Nitrite, UA: NEGATIVE
Protein, UA: NEGATIVE
SPEC GRAV UA: 1.025 (ref 1.010–1.025)
UROBILINOGEN UA: 0.2 U/dL
pH, UA: 6.5 (ref 5.0–8.0)

## 2016-11-23 MED ORDER — CEPHALEXIN 500 MG PO CAPS: 500.0000 mg | ORAL_CAPSULE | Freq: Two times a day (BID) | ORAL | 0 refills | Status: DC

## 2016-11-23 NOTE — Progress Notes (Signed)
Sx started this week.  Frequency, feeling of incomplete voiding, pressure.  No blood seen in urine or stools. No fevers.  Some nausea, but not vomiting.    Started AZO in the meantime, that helped some. She is drinking more fluids and taking cranberry juice.  She is about 50% better now.    ROS: Per HPI unless specifically indicated in ROS section   Meds, vitals, and allergies reviewed.   GEN: nad, alert and oriented NECK: supple w/o LA CV: rrr PULM: ctab, no inc wob ABD: soft, +bs, not ttp No CVA pain Ext w/o edema.

## 2016-11-23 NOTE — Patient Instructions (Signed)
Okay to keep taking AZO for a few more days.  Drink plenty of water and hold the antibiotics for now- start if worse.  We'll contact you with your lab report.  Take care.

## 2016-11-23 NOTE — Assessment & Plan Note (Signed)
Some better today, check ucx, hold keflex for now.  See AVS.  She agrees, plan and options d/w pt.  Await ucx.  Nontoxic.  Okay for outpatient f/u.

## 2016-11-25 LAB — URINE CULTURE

## 2017-01-21 ENCOUNTER — Telehealth: Payer: Self-pay | Admitting: Family Medicine

## 2017-01-21 DIAGNOSIS — R911 Solitary pulmonary nodule: Secondary | ICD-10-CM

## 2017-01-21 NOTE — Telephone Encounter (Signed)
Call patient. Due for follow-up CT chest. Ordered. How is her breathing at this point? Please let me know.  Thanks.

## 2017-01-22 NOTE — Telephone Encounter (Signed)
Patient advised.   Patient's breathing has been good until the past few days and she has had more trouble with the hot, muggy weather.  Patient says she thinks she has gained some weight also which probably exacerbates it.

## 2017-01-22 NOTE — Telephone Encounter (Signed)
Noted, I would get the CT and then go from there.  Thanks.

## 2017-02-05 ENCOUNTER — Ambulatory Visit (INDEPENDENT_AMBULATORY_CARE_PROVIDER_SITE_OTHER)
Admission: RE | Admit: 2017-02-05 | Discharge: 2017-02-05 | Disposition: A | Discharge status: Home / Self Care | Payer: PPO | Source: Ambulatory Visit | Attending: Family Medicine | Admitting: Family Medicine

## 2017-02-05 DIAGNOSIS — R911 Solitary pulmonary nodule: Secondary | ICD-10-CM | POA: Diagnosis not present

## 2017-02-12 ENCOUNTER — Other Ambulatory Visit: Payer: Self-pay | Admitting: Family Medicine

## 2017-02-12 DIAGNOSIS — E559 Vitamin D deficiency, unspecified: Secondary | ICD-10-CM

## 2017-02-12 NOTE — Telephone Encounter (Signed)
Needs f/u vit D level first.  Order is in.  Thanks.

## 2017-02-12 NOTE — Telephone Encounter (Signed)
Electronic refill request. Vitamin D Last office visit:   11/23/16 Last Filled:   ?, not currently on meds list Please advise.

## 2017-02-13 NOTE — Telephone Encounter (Signed)
Left detailed message on voicemail.  

## 2017-02-26 DIAGNOSIS — M722 Plantar fascial fibromatosis: Secondary | ICD-10-CM | POA: Diagnosis not present

## 2017-02-26 DIAGNOSIS — M7672 Peroneal tendinitis, left leg: Secondary | ICD-10-CM | POA: Diagnosis not present

## 2017-04-06 NOTE — Progress Notes (Signed)
Cardiology Office Note  Date:  04/09/2017   ID:  Kara Wright 21-Nov-1945, MRN [id number]  PCP:  Tonia Ghent, MD   Chief Complaint  Patient presents with  . other    12 month f/u occassional afib episodes and leg cramping. Meds reviewed verbally with pt.    HPI:   [name] is a very pleasant 71 year old woman with  morbid obesity,  hypothyroidism,  remote smoking history for less than 10 years,  prior TIA,CVA.  atrial fibrillation seen on Holter monitor in 2013 She presents today for follow-up of her paroxysmal atrial fibrillation  Hospitalization May 2018 Pneumonia with COPD exacerbation Treated with antibiotics and steroids Hospital records reviewed with the patient in detail -CT high-resolution without ILD findings  -Echocardiogram with normal EF and diastolic function  -D-dimer negative, low suspicion for PE  -Possible component of COPD, although no previous diagnosis.   a Few short spells of atrial fib,  coupld an hour, typically less than 30 Wright  In the donut hole, xarelto $150, requesting samples  Rarely takes diltiazem   No regular exercise program Tolerating anticoagulation No TIA or stroke symptoms Previous problems with plantar fasciitis, improved with cortisone shots with podiatry  now with right hip pain She is still working, needs money to help pay for her and husband's medications   EKG on today's visit shows normal sinus rhythm with rate 65 bpm, no significant ST or T-wave changes  Other past medical history  February 22nd 2016 she had 30 minutes of tachycardia. She is concerned this could have been atrial fibrillation   08/25/2011,  right eye vision changes, right arm weakness and numbness lasting for 30-40 minutes. Symptoms seemed to resolve. The next day, on Saturday, she had some mouth and facial numbness and she went to the emergency room. She also had headache. They did a EKG and CT scan of the head that showed no stroke. Early in  the week, she continued to have headache and mouth issues and she was taken to [facility]. She had MRA that showed small vessel disease, carotid ultrasound that showed mild plaque. She reports that she was in the hospital for 3 days. No atrial fibrillation was seen.   Echocardiogram was essentially normal. Prior episodes of tachycardia lasting less than 1 minute on her last clinic visit   Previous Holter monitor that showed short periods of supraventricular tachycardia, rare APCs, long stretches of atrial fibrillation. She did not have any symptoms while wearing the monitor  PMH:   has a past medical history of Atrial fib/flutter, transient; Cancer ([place]); Diverticulosis (03/1997); Dysrhythmia; GERD (gastroesophageal reflux disease) (1997); Hepatitis; Hyperlipidemia (08/1996); Hypothyroidism (1984); Migraine; Osteopenia (11/2002); and Stroke [facility]) (08/2011).  PSH:    Past Surgical History:  Procedure Laterality Date  . ABDOMINAL HYSTERECTOMY    . ANAL FISSURE REPAIR    . CHOLECYSTECTOMY  07/2006  . 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 nasolabial fold skin flap surgery  08/02/2007   [facility]  . PARTIAL HYSTERECTOMY  1978-79  . POLYPECTOMY  04/06/97   colon, benign by pathology  . varicose vein ablation sclerotherapy  09/2008   Dr. Hulda Humphrey    Current Outpatient Prescriptions  Medication Sig Dispense Refill  . albuterol (PROVENTIL HFA;VENTOLIN HFA) 108 (90 Base) MCG/ACT inhaler Inhale 1-2 puffs into the lungs every 6 (six) hours as needed for wheezing or shortness of breath. 1 Inhaler 0  . diltiazem (CARDIZEM) 30 MG  tablet Take 1 tablet (30 mg total) by mouth 3 (three) times daily as needed. 90 tablet 1  . diphenhydrAMINE (SOMINEX) 25 MG tablet Take 25 mg by mouth at bedtime as needed for sleep.    Marland Kitchen levothyroxine (SYNTHROID, LEVOTHROID) 150 MCG tablet Take 1 tablet (150 mcg total) by mouth daily. Except for 1.5 on Sunday 100 tablet 3  . omeprazole  (PRILOSEC) 20 MG capsule Take 1 capsule (20 mg total) by mouth daily. 90 capsule 3  . promethazine (PHENERGAN) 25 MG tablet Take 1 tablet (25 mg total) by mouth every 6 (six) hours as needed. For nausea 30 tablet 3  . propranolol ER (INDERAL LA) 120 MG 24 hr capsule TAKE ONE CAPSULE BY MOUTH DAILY (Patient taking differently: TAKE 120mg  CAPSULE BY MOUTH at night) 90 capsule 3  . rosuvastatin (CRESTOR) 10 MG tablet Take 1 tablet (10 mg total) by mouth daily. (Patient taking differently: Take 5 mg by mouth at bedtime. ) 90 tablet 3  . XARELTO 20 MG TABS tablet TAKE ONE (1) TABLET BY MOUTH EVERY DAY WITH SUPPER (Patient taking differently: TAKE 20mg  BY MOUTH EVERY DAY WITH SUPPER) 90 tablet 3   No current facility-administered medications for this visit.      Allergies:   Celecoxib; Gold-containing drug products; Nickel; Other; and Simvastatin   Social History:  The patient  reports that she quit smoking about 30 years ago. She has never used smokeless tobacco. She reports that she does not drink alcohol or use drugs.   Family History:   family history includes Allergies in her sister; Breast cancer in her cousin; Colon cancer (age of onset: 71) in her father; Hypertension in her mother; Osteopenia in her mother; Stroke in her mother.    Review of Systems: Review of Systems  Constitutional: Negative.   Respiratory: Negative.   Cardiovascular: Negative.   Gastrointestinal: Negative.   Musculoskeletal: Positive for joint pain.  Neurological: Negative.   Psychiatric/Behavioral: Negative.   All other systems reviewed and are negative.    PHYSICAL EXAM: VS:  BP 132/78 (BP Location: Left Arm, Patient Position: Sitting, Cuff Size: Large)   Pulse 65   Ht 5\' 5"  (1.651 m)   Wt 238 lb (108 kg)   BMI 39.61 kg/m  , BMI Body mass index is 39.61 kg/m.  No significant change compared to previous office visit GEN: Well nourished, well developed, in no acute distress, obese  HEENT: normal  Neck:  no JVD, carotid bruits, or masses Cardiac: RRR; no murmurs, rubs, or gallops,no edema  Respiratory:  clear to auscultation bilaterally, normal work of breathing GI: soft, nontender, nondistended, + BS MS: no deformity or atrophy  Skin: warm and dry, no rash Neuro:  Strength and sensation are intact Psych: euthymic mood, full affect    Recent Labs: 10/26/2016: B Natriuretic Peptide 60.0; TSH 1.687 10/27/2016: ALT 16 10/28/2016: BUN 15; Creatinine, Ser 0.79; Hemoglobin 12.9; Platelets 153; Potassium 3.5; Sodium 140    Lipid Panel Lab Results  Component Value Date   CHOL 119 07/27/2016   HDL 40.80 07/27/2016   LDLCALC 42 07/27/2016   TRIG 181.0 (H) 07/27/2016      Wt Readings from Last 3 Encounters:  04/09/17 238 lb (108 kg)  11/23/16 234 lb 12 oz (106.5 kg)  10/31/16 230 lb (104.3 kg)       ASSESSMENT AND PLAN:   Pure hypercholesterolemia Cholesterol is at goal on the current lipid regimen. No changes to the medications were made.  Paroxysmal atrial  fibrillation ([place]) Very rare short episodes of atrial fibrillation No changes to her medications at this time Again we will continue with diltiazem as needed, propranolol every day Obesity likely playing a role in her episodes  Essential hypertension Blood pressure is well controlled on today's visit. No changes made to the medications.  Morbid obesity ([place]) No regular exercise program   Long discussion concerning her medications, we will apply for assistance for her blood thinner Samples provided, coupon provided  Total encounter time more than 25 minutes  Greater than 50% was spent in counseling and coordination of care with the patient   Disposition:   F/U  12 months  No orders of the defined types were placed in this encounter.    Signed, Esmond Plants, M.D., Ph.D. 04/09/2017  [place], [place]

## 2017-04-09 ENCOUNTER — Encounter: Payer: Self-pay | Admitting: Family Medicine

## 2017-04-09 ENCOUNTER — Ambulatory Visit (INDEPENDENT_AMBULATORY_CARE_PROVIDER_SITE_OTHER): Payer: PPO | Admitting: Family Medicine

## 2017-04-09 ENCOUNTER — Encounter: Payer: Self-pay | Admitting: Cardiovascular Disease

## 2017-04-09 ENCOUNTER — Ambulatory Visit (INDEPENDENT_AMBULATORY_CARE_PROVIDER_SITE_OTHER): Payer: PPO | Admitting: Cardiovascular Disease

## 2017-04-09 VITALS — BP 132/72 | HR 63 | Temp 98.6°F | Wt 239.0 lb

## 2017-04-09 VITALS — BP 132/78 | HR 65 | Ht 65.0 in | Wt 238.0 lb

## 2017-04-09 DIAGNOSIS — E559 Vitamin D deficiency, unspecified: Secondary | ICD-10-CM

## 2017-04-09 DIAGNOSIS — I480 Paroxysmal atrial fibrillation: Secondary | ICD-10-CM | POA: Diagnosis not present

## 2017-04-09 DIAGNOSIS — M7061 Trochanteric bursitis, right hip: Secondary | ICD-10-CM | POA: Diagnosis not present

## 2017-04-09 DIAGNOSIS — J9601 Acute respiratory failure with hypoxia: Secondary | ICD-10-CM

## 2017-04-09 DIAGNOSIS — E7800 Pure hypercholesterolemia, unspecified: Secondary | ICD-10-CM

## 2017-04-09 DIAGNOSIS — I10 Essential (primary) hypertension: Secondary | ICD-10-CM

## 2017-04-09 DIAGNOSIS — Z23 Encounter for immunization: Secondary | ICD-10-CM

## 2017-04-09 DIAGNOSIS — I25118 Atherosclerotic heart disease of native coronary artery with other forms of angina pectoris: Secondary | ICD-10-CM | POA: Diagnosis not present

## 2017-04-09 LAB — VITAMIN D 25 HYDROXY (VIT D DEFICIENCY, FRACTURES): VITD: 16.57 ng/mL — ABNORMAL LOW (ref 30.00–100.00)

## 2017-04-09 MED ORDER — TRAMADOL HCL 50 MG PO TABS: 50.0000 mg | ORAL_TABLET | Freq: Three times a day (TID) | ORAL | 1 refills | Status: DC | PRN

## 2017-04-09 MED ORDER — RIVAROXABAN 20 MG PO TABS: ORAL_TABLET | ORAL | Status: DC

## 2017-04-09 MED ORDER — PROPRANOLOL HCL ER 120 MG PO CP24: ORAL_CAPSULE | ORAL | Status: DC

## 2017-04-09 MED ORDER — ROSUVASTATIN CALCIUM 10 MG PO TABS: 5.0000 mg | ORAL_TABLET | Freq: Every day | ORAL | Status: DC

## 2017-04-09 NOTE — Progress Notes (Signed)
R greater troch area pain.  Was usually prev only with prolonged sitting but more pain now, more often.  She is walking with a limp from the pain.  No L sided pain.  No trauma.  No bruising.  Not sore to the touch locally when patient palpated the area.  Tried tylenol.  She can't take nsaids.  On xarelto.    Her plantar fasciitis pain is improved.    claritin didn't help with rhinorrhea.  Unclear if Benadryl will help. Discussed with patient.  Vitamin D deficiency. Due for recheck labs. See notes on labs.    Meds, vitals, and allergies reviewed.   ROS: Per HPI unless specifically indicated in ROS section   nad ncat Clear rhinorrhea noted Back not tender in the midline. No pain on right hip range of motion. Able to rotate internally and externally without pain. Right greater trochanteric bursa is tender to palpation on exam. Quad and hamstring and distal ITB not tender to palpation. Able to bear weight.

## 2017-04-09 NOTE — Patient Instructions (Addendum)
xarelto 30 day coupon Patient assistance for xarelto Medication Samples have been provided to the patient.  Drug name: Xarelto       Strength: 20 mg        Qty: 3 bottles  LOT: [id number]  Exp.Date: 03/21    Medication Instructions:   No medication changes made  Labwork:  No new labs needed  Testing/Procedures:  No further testing at this time   Follow-Up: It was a pleasure seeing you in the office today. Please call us if you have new issues that need to be addressed before your next appt.  [phone]  Your physician wants you to follow-up in: 12 months.  You will receive a reminder letter in the mail two months in advance. If you don't receive a letter, please call our office to schedule the follow-up appointment.  If you need a refill on your cardiac medications before your next appointment, please call your pharmacy.

## 2017-04-09 NOTE — Patient Instructions (Addendum)
Ice your hip.  Try tramadol for pain, along with tylenol.  Sedation caution on tramadol.  If not better, then update me and I'll check with Dr. Lorelei Pont about possible injection and your blood thinner.  See if benadryl helps more with your runny nose, more than claritin.  Take care.  Glad to see you.

## 2017-04-10 DIAGNOSIS — E559 Vitamin D deficiency, unspecified: Secondary | ICD-10-CM | POA: Insufficient documentation

## 2017-04-10 DIAGNOSIS — M25559 Pain in unspecified hip: Secondary | ICD-10-CM | POA: Insufficient documentation

## 2017-04-10 NOTE — Assessment & Plan Note (Signed)
Likely. Anatomy discussed with patient. Reasonable to try tramadol along with Tylenol and ice the area locally.  If she is not improved with that she may end up needing injection. She is on Xarelto. I will route this to Dr. Lorelei Pont for his input, about the possibility of injection and for consideration about temporarily holding xarelto.

## 2017-04-10 NOTE — Assessment & Plan Note (Signed)
Due for recheck labs.  See notes on labs.   

## 2017-04-16 ENCOUNTER — Other Ambulatory Visit: Payer: Self-pay | Admitting: Family Medicine

## 2017-04-16 DIAGNOSIS — E559 Vitamin D deficiency, unspecified: Secondary | ICD-10-CM

## 2017-04-16 MED ORDER — VITAMIN D (ERGOCALCIFEROL) 1.25 MG (50000 UNIT) PO CAPS: 50000.0000 [IU] | ORAL_CAPSULE | ORAL | 0 refills | Status: DC

## 2017-04-17 ENCOUNTER — Encounter: Payer: Self-pay | Admitting: Internal Medicine

## 2017-05-13 ENCOUNTER — Telehealth: Payer: Self-pay | Admitting: Family Medicine

## 2017-05-13 DIAGNOSIS — R911 Solitary pulmonary nodule: Secondary | ICD-10-CM

## 2017-05-13 NOTE — Telephone Encounter (Signed)
Due for 3 months f/u CT.  Ordered.  Thanks.

## 2017-05-14 NOTE — Telephone Encounter (Signed)
Already scheduled

## 2017-05-21 ENCOUNTER — Ambulatory Visit: Payer: PPO

## 2017-06-18 ENCOUNTER — Ambulatory Visit
Admission: RE | Admit: 2017-06-18 | Discharge: 2017-06-18 | Disposition: A | Discharge status: Home / Self Care | Payer: PPO | Source: Ambulatory Visit | Attending: Family Medicine | Admitting: Family Medicine

## 2017-06-18 ENCOUNTER — Telehealth: Payer: Self-pay | Admitting: Family Medicine

## 2017-06-18 DIAGNOSIS — I2510 Atherosclerotic heart disease of native coronary artery without angina pectoris: Secondary | ICD-10-CM | POA: Diagnosis not present

## 2017-06-18 DIAGNOSIS — R911 Solitary pulmonary nodule: Secondary | ICD-10-CM | POA: Diagnosis not present

## 2017-06-18 DIAGNOSIS — I700 Atherosclerosis of aorta: Secondary | ICD-10-CM | POA: Insufficient documentation

## 2017-06-18 NOTE — Telephone Encounter (Signed)
Patient is on xarelto.  Can you see her about her trochanteric bursitis?  Many thanks.

## 2017-06-19 DIAGNOSIS — H269 Unspecified cataract: Secondary | ICD-10-CM

## 2017-06-19 HISTORY — DX: Unspecified cataract: H26.9

## 2017-06-19 NOTE — Telephone Encounter (Signed)
No problem. The only real challenge is NSAIDS. Happy to see her.

## 2017-06-19 NOTE — Telephone Encounter (Signed)
Many thanks, I routed this to [place] re: scheduling.

## 2017-06-20 NOTE — Telephone Encounter (Signed)
Spoke with Kara Wright.  Appointment scheduled 06/25/2017 at 11:30 am with Dr. Lorelei Pont.

## 2017-06-25 ENCOUNTER — Ambulatory Visit (INDEPENDENT_AMBULATORY_CARE_PROVIDER_SITE_OTHER)
Admission: RE | Admit: 2017-06-25 | Discharge: 2017-06-25 | Disposition: A | Discharge status: Home / Self Care | Payer: PPO | Source: Ambulatory Visit | Attending: Family Medicine | Admitting: Family Medicine

## 2017-06-25 ENCOUNTER — Encounter: Payer: Self-pay | Admitting: Family Medicine

## 2017-06-25 ENCOUNTER — Ambulatory Visit: Payer: PPO | Admitting: Family Medicine

## 2017-06-25 ENCOUNTER — Other Ambulatory Visit: Payer: Self-pay

## 2017-06-25 VITALS — BP 118/70 | HR 62 | Temp 98.3°F | Ht 65.0 in | Wt 237.5 lb

## 2017-06-25 DIAGNOSIS — M25551 Pain in right hip: Secondary | ICD-10-CM

## 2017-06-25 DIAGNOSIS — M7061 Trochanteric bursitis, right hip: Secondary | ICD-10-CM | POA: Diagnosis not present

## 2017-06-25 DIAGNOSIS — M1611 Unilateral primary osteoarthritis, right hip: Secondary | ICD-10-CM

## 2017-06-25 MED ORDER — METHYLPREDNISOLONE ACETATE 40 MG/ML IJ SUSP
80.0000 mg | Freq: Once | INTRAMUSCULAR | Status: AC
  Administered 2017-06-25: 80 mg via INTRA_ARTICULAR

## 2017-06-25 NOTE — Progress Notes (Signed)
Dr. Frederico Hamman T. Osric Klopf, MD, [facility] [place], [address] Phone: [phone] Fax: [phone]  06/25/2017  Patient: Kara Wright, MRN: [id number], DOB: 29-Apr-1946, 72 y.o.  Primary Physician:  Tonia Ghent, MD   Chief Complaint  Patient presents with  . Hip Pain    Right-Trochanteric Bursitis   Subjective:   Kara Wright is a 72 y.o. very pleasant female patient who presents with the following:  R hip, started several years ago, now pain with lifting her foot and elevating her foot.  Stiff on the outside.  She tells me that she first started having pain on the lateral aspect of her hip somewhere between 3 and 4 years ago.  It worsened notably in the last year when she has decreased her exercise patterns, and she does have Body mass index is 39.52 kg/m.  She has not had any trauma, accident, or injury anywhere around her hip at all or the femur.  At this point, she is not particularly active.  R GTB inj  Past Medical History, Surgical History, Social History, Family History, Problem List, Medications, and Allergies have been reviewed and updated if relevant.  Patient Active Problem List   Diagnosis Date Noted  . Vitamin D deficiency 04/10/2017  . Greater trochanteric bursitis 04/10/2017  . 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[place]) 11/01/2016  . Acute hypoxemic respiratory failure ([place]) 10/27/2016  . UTI (urinary tract infection) 10/26/2016  . Low back pain 08/04/2016  . Dysuria 09/02/2015  . Morbid obesity ([place]) 04/05/2015  . Advance care planning 08/07/2014  . Anxiety 07/16/2012  . Esophagitis 04/17/2012  . 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 TIA (transient ischemic attack) 08/27/2011  . Medicare annual wellness visit, initial 06/25/2011  . Atrial fibrillation ([place]) 10/31/2010  . HTN (hypertension) 10/31/2010  . 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  Hypothyroidism 12/15/2006  . HLD (hyperlipidemia) 12/15/2006  . MIGRAINE HEADACHE 12/15/2006  . GLAUCOMA 12/15/2006  . GERD (gastroesophageal reflux disease) 12/15/2006  . DIVERTICULOSIS, COLON 12/15/2006  . Osteoporosis 12/15/2006    Past Medical History:  Diagnosis Date  . Atrial fib/flutter, transient    1 episode, resolved, never on coumadin  . Cancer ([place])    skin cancer  . Diverticulosis 03/1997  . Dysrhythmia    atrial fibrilation  . GERD (gastroesophageal reflux disease) 1997   good on prilosec  . Hepatitis    hx of hep B  . Hyperlipidemia 08/1996  . Hypothyroidism 1984  . Migraine    w/o aura  . Osteopenia 11/2002   via dexa  . Stroke [place]) 08/2011   TIA    Past Surgical History:  Procedure Laterality Date  . ABDOMINAL HYSTERECTOMY    . ANAL FISSURE REPAIR    . CHOLECYSTECTOMY  07/2006  . 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 nasolabial fold skin flap surgery  08/02/2007   [facility]  . PARTIAL HYSTERECTOMY  1978-79  . POLYPECTOMY  04/06/97   colon, benign by pathology  . varicose vein ablation sclerotherapy  09/2008   Dr. Hulda Humphrey    Social History   Socioeconomic History  . Marital status: Married    Spouse name: Not on file  . Number of children: 2  . Years of education: Not on file  . Highest education level: Not on file  Social Needs  . Financial resource strain: Not on  file  . Food insecurity - worry: Not on file  . Food insecurity - inability: Not on file  . Transportation needs - medical: Not on file  . Transportation needs - non-medical: Not on file  Occupational History  . Occupation: Ecologist: RETIRED    Comment: [employer]  . Occupation: Barrister's clerk: [employer]  Tobacco Use  . Smoking status: Former Smoker    Last attempt to quit: 06/26/1986    Years since quitting: 31.0  . Smokeless tobacco: Never Used  Substance and Sexual Activity  . Alcohol use:  No  . Drug use: No  . Sexual activity: Not Currently  Other Topics Concern  . Not on file  Social History Narrative   Works part time at [employer] office   Married 1979   2 daughters from prev relationship   [employer] fan, [employer] fan    Family History  Problem Relation Age of Onset  . Hypertension Mother   . Osteopenia Mother   . Stroke Mother        deceased age 20  . Colon cancer Father 60       mets  . Allergies Sister   . Breast cancer Cousin        pat cousin    Allergies  Allergen Reactions  . Celecoxib     REACTION: nausea: GI UPSET  . Gold-Containing Drug Products   . Nickel     Skin rash, allergy  . Other     Fragrance, phenylenediamine, contrast metal agents  . Simvastatin     Aches but tolerates crestor    Medication list reviewed and updated in full in [facility].  GEN: No fevers, chills. Nontoxic. Primarily MSK c/o today. MSK: Detailed in the HPI GI: tolerating PO intake without difficulty Neuro: No numbness, parasthesias, or tingling associated. Otherwise the pertinent positives of the ROS are noted above.   Objective:   BP 118/70   Pulse 62   Temp 98.3 F (36.8 C) (Oral)   Ht 5\' 5"  (1.651 m)   Wt 237 lb 8 oz (107.7 kg)   BMI 39.52 kg/m    GEN: WDWN, NAD, Non-toxic, Alert & Oriented x 3 HEENT: Atraumatic, Normocephalic.  Ears and Nose: No external deformity. EXTR: No clubbing/cyanosis/edema NEURO: Normal gait.  PSYCH: Normally interactive. Conversant. Not depressed or anxious appearing.  Calm demeanor.   HIP EXAM: SIDE: R ROM: Abduction, Flexion, Internal and External range of motion: Mild restriction of motion in abduction.  Also mild restriction of motion and internal range of motion.  External range of motion is full. Pain with terminal IROM and EROM: There is modest pain with terminal abduction as well as terminal internal range of motion. GTB: TTP SLR: NEG Knees: No effusion FABER: NT REVERSE FABER: NT,  neg Piriformis: NT at direct palpation Str: flexion: 5/5 abduction: 5/5 adduction: 5/5 Strength testing non-tender  Radiology: Dg Hip Unilat With Pelvis 2-3 Views Right  Result Date: 06/25/2017 CLINICAL DATA:  Right hip pain, initial encounter EXAM: DG HIP (WITH OR WITHOUT PELVIS) 3V RIGHT COMPARISON:  None. FINDINGS: Mild degenerative changes of the hip joint are noted. No acute fracture or dislocation is noted. No soft tissue changes are seen appear IMPRESSION: Mild degenerative change without acute abnormality Electronically Signed   By: Inez Catalina M.D.   On: 06/25/2017 14:27    Assessment and Plan:   Trochanteric bursitis, right hip  Right  hip pain - Plan: DG HIP UNILAT WITH PELVIS 2-3 VIEWS RIGHT, methylPREDNISolone acetate (DEPO-MEDROL) injection 80 mg  Morbid obesity (HCC)  Primary osteoarthritis of right hip  Multifactorial hip pain with long-standing trochanteric bursitis, morbid obesity and decreased activity over the last year or so likely has contributed to this.  She recognizes that she felt better in her hips and pelvis when she was more active, and I am encouraged her to be more active.  I did give her some rehab for her hips as well.  Osteoarthritis of the right hip is greater than anticipated, but still would be considered only mild in character.  This may be contributing to her symptoms.  We will do a trochanteric bursa injection, and also recommend that the patient work on losing weight and get back to exercise.  Trochanteric Bursitis Injection, R Verbal consent obtained. Risks (including infection, potential atrophy), benefits, and alternatives reviewed. Greater trochanter sterilely prepped with Chloraprep. Ethyl Chloride used for anesthesia. 8 cc of Lidocaine 1% injected with 2 mL of Depo-Medrol 40 mg into trochanteric bursa at area of maximal tenderness at greater trochanter. Needle taken to bone to troch bursa, flows easily. Bursa massaged. No bleeding and no  complications. Decreased pain after injection. Needle: 22 gauge spinal needle   Follow-up: prn or if sx return  Meds ordered this encounter  Medications  . methylPREDNISolone acetate (DEPO-MEDROL) injection 80 mg   There are no discontinued medications. Orders Placed This Encounter  Procedures  . DG HIP UNILAT WITH PELVIS 2-3 VIEWS RIGHT    Signed,  Lillyann Ahart T. Beyonca Wisz, MD   Allergies as of 06/25/2017      Reactions   Celecoxib    REACTION: nausea: GI UPSET   Gold-containing Drug Products    Nickel    Skin rash, allergy   Other    Fragrance, phenylenediamine, contrast metal agents   Simvastatin    Aches but tolerates crestor      Medication List        Accurate as of 06/25/17 11:59 PM. Always use your most recent med list.          albuterol 108 (90 Base) MCG/ACT inhaler Commonly known as:  PROVENTIL HFA;VENTOLIN HFA Inhale 1-2 puffs into the lungs every 6 (six) hours as needed for wheezing or shortness of breath.   diltiazem 30 MG tablet Commonly known as:  CARDIZEM Take 1 tablet (30 mg total) by mouth 3 (three) times daily as needed.   diphenhydrAMINE 25 MG tablet Commonly known as:  BENADRYL Take 25 mg by mouth every 8 (eight) hours as needed for itching.   levothyroxine 150 MCG tablet Commonly known as:  SYNTHROID, LEVOTHROID Take 1 tablet (150 mcg total) by mouth daily. Except for 1.5 on Sunday   omeprazole 20 MG capsule Commonly known as:  PRILOSEC Take 1 capsule (20 mg total) by mouth daily.   promethazine 25 MG tablet Commonly known as:  PHENERGAN Take 1 tablet (25 mg total) by mouth every 6 (six) hours as needed. For nausea   propranolol ER 120 MG 24 hr capsule Commonly known as:  INDERAL LA TAKE 120mg  CAPSULE BY MOUTH at night   rivaroxaban 20 MG Tabs tablet Commonly known as:  XARELTO TAKE 20mg  BY MOUTH EVERY DAY WITH SUPPER   rosuvastatin 10 MG tablet Commonly known as:  CRESTOR Take 0.5 tablets (5 mg total) by mouth at bedtime.     traMADol 50 MG tablet Commonly known as:  ULTRAM Take 1 tablet (50  mg total) by mouth every 8 (eight) hours as needed (sedation caution).   Vitamin D (Ergocalciferol) 50000 units Caps capsule Commonly known as:  DRISDOL Take 1 capsule (50,000 Units total) by mouth every 7 (seven) days.

## 2017-06-28 ENCOUNTER — Other Ambulatory Visit: Payer: Self-pay | Admitting: Family Medicine

## 2017-06-28 DIAGNOSIS — E559 Vitamin D deficiency, unspecified: Secondary | ICD-10-CM

## 2017-06-28 NOTE — Telephone Encounter (Signed)
Electronic refill request. Vitamin D 50,000 units Last office visit:   06/25/17 [place] Last Filled:     12 capsule 0 04/16/2017  Please advise.

## 2017-06-29 NOTE — Telephone Encounter (Signed)
Needs recheck vit D first.  Prev ordered.  Thanks.   I denied rx in the meantime.

## 2017-06-29 NOTE — Telephone Encounter (Signed)
Patient advised. Will schedule lab appt later.

## 2017-07-14 ENCOUNTER — Other Ambulatory Visit: Payer: Self-pay | Admitting: Cardiovascular Disease

## 2017-07-15 ENCOUNTER — Other Ambulatory Visit: Payer: Self-pay | Admitting: Cardiovascular Disease

## 2017-07-16 NOTE — Telephone Encounter (Signed)
Please review for refill, Thanks !  

## 2017-07-19 ENCOUNTER — Telehealth: Payer: Self-pay | Admitting: Cardiovascular Disease

## 2017-07-19 MED ORDER — ROSUVASTATIN CALCIUM 5 MG PO TABS: 5.0000 mg | ORAL_TABLET | Freq: Every day | ORAL | 3 refills | Status: DC

## 2017-07-19 NOTE — Telephone Encounter (Signed)
Ok to refill as Dr Rockey Situ has in the past.

## 2017-07-19 NOTE — Telephone Encounter (Signed)
°*  STAT* If patient is at the pharmacy, call can be transferred to refill team.   1. Which medications need to be refilled? (please list name of each medication and dose if known)  Rosuvastatin   2. Which pharmacy/location (including street and city if local pharmacy) is medication to be sent to? [facility]   3. Do they need a 30 day or 90 day supply?  90 day

## 2017-07-19 NOTE — Telephone Encounter (Signed)
Please advise if ok to refill Crestor.  Crestor last filled by PCP.

## 2017-07-30 ENCOUNTER — Other Ambulatory Visit: Payer: Self-pay | Admitting: Family Medicine

## 2017-07-30 DIAGNOSIS — I480 Paroxysmal atrial fibrillation: Secondary | ICD-10-CM

## 2017-07-30 DIAGNOSIS — E559 Vitamin D deficiency, unspecified: Secondary | ICD-10-CM

## 2017-08-06 ENCOUNTER — Ambulatory Visit (INDEPENDENT_AMBULATORY_CARE_PROVIDER_SITE_OTHER): Payer: PPO

## 2017-08-06 ENCOUNTER — Other Ambulatory Visit: Payer: Self-pay

## 2017-08-06 VITALS — BP 124/80 | HR 65 | Temp 97.7°F | Ht 64.0 in | Wt 236.8 lb

## 2017-08-06 DIAGNOSIS — E559 Vitamin D deficiency, unspecified: Secondary | ICD-10-CM

## 2017-08-06 DIAGNOSIS — Z0000 Encounter for general adult medical examination without abnormal findings: Secondary | ICD-10-CM | POA: Diagnosis not present

## 2017-08-06 DIAGNOSIS — I480 Paroxysmal atrial fibrillation: Secondary | ICD-10-CM | POA: Diagnosis not present

## 2017-08-06 LAB — COMPREHENSIVE METABOLIC PANEL
ALBUMIN: 3.8 g/dL (ref 3.5–5.2)
ALK PHOS: 67 U/L (ref 39–117)
ALT: 13 U/L (ref 0–35)
AST: 15 U/L (ref 0–37)
BILIRUBIN TOTAL: 0.7 mg/dL (ref 0.2–1.2)
BUN: 9 mg/dL (ref 6–23)
CO2: 31 meq/L (ref 19–32)
Calcium: 9 mg/dL (ref 8.4–10.5)
Chloride: 104 mEq/L (ref 96–112)
Creatinine, Ser: 0.69 mg/dL (ref 0.40–1.20)
GFR: 89.14 mL/min (ref >60.00)
Glucose, Bld: 104 mg/dL — ABNORMAL HIGH (ref 70–99)
Potassium: 3.9 mEq/L (ref 3.5–5.1)
Sodium: 141 mEq/L (ref 135–145)
Total Protein: 6.7 g/dL (ref 6.0–8.3)

## 2017-08-06 LAB — CBC WITH DIFFERENTIAL/PLATELET
BASOS ABS: 0 10*3/uL (ref 0.0–0.1)
BASOS PCT: 0.3 % (ref 0.0–3.0)
Eosinophils Absolute: 0.1 10*3/uL (ref 0.0–0.7)
Eosinophils Relative: 1.8 % (ref 0.0–5.0)
HEMATOCRIT: 41 % (ref 36.0–46.0)
HEMOGLOBIN: 13.6 g/dL (ref 12.0–15.0)
LYMPHS PCT: 33.3 % (ref 12.0–46.0)
Lymphs Abs: 2.2 10*3/uL (ref 0.7–4.0)
MCHC: 33.1 g/dL (ref 30.0–36.0)
MCV: 83.1 fl (ref 78.0–100.0)
MONOS PCT: 8.5 % (ref 3.0–12.0)
Monocytes Absolute: 0.6 10*3/uL (ref 0.1–1.0)
NEUTROS ABS: 3.7 10*3/uL (ref 1.4–7.7)
Neutrophils Relative %: 56.1 % (ref 43.0–77.0)
PLATELETS: 163 10*3/uL (ref 150.0–400.0)
RBC: 4.93 Mil/uL (ref 3.87–5.11)
RDW: 14.5 % (ref 11.5–15.5)
WBC: 6.5 10*3/uL (ref 4.0–10.5)

## 2017-08-06 LAB — LIPID PANEL
CHOLESTEROL: 114 mg/dL (ref 0–200)
HDL: 39.9 mg/dL (ref >39.00)
LDL Cholesterol: 47 mg/dL (ref 0–99)
NonHDL: 74.55
TRIGLYCERIDES: 137 mg/dL (ref 0.0–149.0)
Total CHOL/HDL Ratio: 3
VLDL: 27.4 mg/dL (ref 0.0–40.0)

## 2017-08-06 LAB — TSH: TSH: 4.44 u[IU]/mL (ref 0.35–4.50)

## 2017-08-06 LAB — VITAMIN D 25 HYDROXY (VIT D DEFICIENCY, FRACTURES): VITD: 27.1 ng/mL — AB (ref 30.00–100.00)

## 2017-08-06 NOTE — Progress Notes (Signed)
Subjective:   Kara Wright is a 72 y.o. female who presents for Medicare Annual (Subsequent) preventive examination.  Review of Systems:  N/A Cardiac Risk Factors include: advanced age (>72men, >60 women);obesity (BMI >30kg/m2);dyslipidemia;hypertension     Objective:     Vitals: BP 124/80 (BP Location: Right Arm, Patient Position: Sitting, Cuff Size: Normal)   Pulse 65   Temp 97.7 F (36.5 C) (Oral)   Ht 5\' 4"  (1.626 m) Comment: no shoes  Wt 236 lb 12 oz (107.4 kg)   SpO2 97%   BMI 40.64 kg/m   Body mass index is 40.64 kg/m.  Advanced Directives 08/06/2017 10/26/2016 10/21/2016 08/03/2016 08/28/2011  Does Patient Have a Medical Advance Directive? No No No No Patient does not have advance directive;Patient would not like information  Would patient like information on creating a medical advance directive? No - Patient declined No - Patient declined - - -  Pre-existing out of facility DNR order (yellow form or pink MOST form) - - - - No    Tobacco Social History   Tobacco Use  Smoking Status Former Smoker  . Last attempt to quit: 06/26/1986  . Years since quitting: 31.1  Smokeless Tobacco Never Used     Counseling given: No   Clinical Intake:  Pre-visit preparation completed: Yes  Pain : No/denies pain Pain Score: 0-No pain     Nutritional Status: BMI > 30  Obese Nutritional Risks: None Diabetes: No  How often do you need to have someone help you when you read instructions, pamphlets, or other written materials from your doctor or pharmacy?: 1 - Never What is the last grade level you completed in school?: 12th grade + some college courses  Interpreter Needed?: No  Comments: pt lives with spouse Information entered by :: LPinson, LPN  Past Medical History:  Diagnosis Date  . Atrial fib/flutter, transient    1 episode, resolved, never on coumadin  . Cancer ([place])    skin cancer  . Cataract 2019   left eye  . Diverticulosis 03/1997  . Dysrhythmia    atrial fibrilation  . GERD (gastroesophageal reflux disease) 1997   good on prilosec  . Hepatitis    hx of hep B  . Hyperlipidemia 08/1996  . Hypothyroidism 1984  . Migraine    w/o aura  . Osteopenia 11/2002   via dexa  . Stroke [facility]) 08/2011   TIA   Past Surgical History:  Procedure Laterality Date  . ABDOMINAL HYSTERECTOMY    . ANAL FISSURE REPAIR    . CHOLECYSTECTOMY  07/2006  . 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 nasolabial fold skin flap surgery  08/02/2007   [facility]  . PARTIAL HYSTERECTOMY  1978-79  . POLYPECTOMY  04/06/97   colon, benign by pathology  . varicose vein ablation sclerotherapy  09/2008   Dr. Hulda Humphrey   Family History  Problem Relation Age of Onset  . Hypertension Mother   . Osteopenia Mother   . Stroke Mother        deceased age 3  . Colon cancer Father 60       mets  . Allergies Sister   . Breast cancer Cousin        pat cousin   Social History   Socioeconomic History  . Marital status: Married    Spouse name: None  . Number of children: 2  . Years of education: None  . Highest education level: None  Social Needs  . Financial resource strain: None  . Food insecurity - worry: None  . Food insecurity - inability: None  . Transportation needs - medical: None  . Transportation needs - non-medical: None  Occupational History  . Occupation: Ecologist: RETIRED    Comment: [employer]  . Occupation: Barrister's clerk: [employer]  Tobacco Use  . Smoking status: Former Smoker    Last attempt to quit: 06/26/1986    Years since quitting: 31.1  . Smokeless tobacco: Never Used  Substance and Sexual Activity  . Alcohol use: No  . Drug use: No  . Sexual activity: Not Currently  Other Topics Concern  . None  Social History Narrative   Works part time at [employer] office   Married 1979   2 daughters from prev relationship   [employer] fan, [employer] fan    Outpatient  Encounter Medications as of 08/06/2017  Medication Sig  . albuterol (PROVENTIL HFA;VENTOLIN HFA) 108 (90 Base) MCG/ACT inhaler Inhale 1-2 puffs into the lungs every 6 (six) hours as needed for wheezing or shortness of breath.  . diltiazem (CARDIZEM) 30 MG tablet Take 1 tablet (30 mg total) by mouth 3 (three) times daily as needed.  . diphenhydrAMINE (BENADRYL) 25 MG tablet Take 25 mg by mouth every 8 (eight) hours as needed for itching.  . levothyroxine (SYNTHROID, LEVOTHROID) 150 MCG tablet Take 1 tablet (150 mcg total) by mouth daily. Except for 1.5 on Sunday  . omeprazole (PRILOSEC) 20 MG capsule Take 1 capsule (20 mg total) by mouth daily.  . promethazine (PHENERGAN) 25 MG tablet Take 1 tablet (25 mg total) by mouth every 6 (six) hours as needed. For nausea  . propranolol ER (INDERAL LA) 120 MG 24 hr capsule TAKE 120mg  CAPSULE BY MOUTH at night  . rosuvastatin (CRESTOR) 5 MG tablet Take 1 tablet (5 mg total) by mouth at bedtime.  . traMADol (ULTRAM) 50 MG tablet Take 1 tablet (50 mg total) by mouth every 8 (eight) hours as needed (sedation caution).  . Vitamin D, Ergocalciferol, (DRISDOL) 50000 units CAPS capsule Take 1 capsule (50,000 Units total) by mouth every 7 (seven) days.  Alveda Reasons 20 MG TABS tablet TAKE 1 TABLET BY MOUTH ONCE DAILY WITH  SUPPER   No facility-administered encounter medications on file as of 08/06/2017.     Activities of Daily Living In your present state of health, do you have any difficulty performing the following activities: 08/06/2017 10/26/2016  Hearing? N N  Vision? N N  Difficulty concentrating or making decisions? N N  Walking or climbing stairs? Y N  Dressing or bathing? N N  Doing errands, shopping? N N  Preparing Food and eating ? N -  Using the Toilet? N -  In the past six months, have you accidently leaked urine? Y -  Do you have problems with loss of bowel control? N -  Managing your Medications? N -  Managing your Finances? N -  Housekeeping or  managing your Housekeeping? N -  Some recent data might be hidden    Patient Care Team: Tonia Ghent, MD as PCP - General (Family Medicine) Dasher, Rayvon Char, MD as Consulting Physician (Dermatology) Minna Merritts, MD as Consulting Physician (Cardiology) Ocie Doyne, OD as Consulting Physician (Optometry) Dale Kalida, DDS as Consulting Physician (Dentistry)    Assessment:   This is a routine wellness examination for Jaleya.   Hearing Screening  125Hz  250Hz  500Hz  1000Hz  2000Hz  3000Hz  4000Hz  6000Hz  8000Hz   Right ear:   40 40 40  0    Left ear:   40 40 40  0    Vision Screening Comments: Last vision exam in April 2018 with Dr. Ocie Doyne    Exercise Activities and Dietary recommendations Current Exercise Habits: The patient does not participate in regular exercise at present, Exercise limited by: orthopedic condition(s)  Goals    . Increase physical activity     Starting 08/10/2017, I will attempt to exercise for 60 minutes twice weekly and to make healthy food choices by eliminating added sugar in an effort to lose weight.        Fall Risk Fall Risk  08/06/2017 08/03/2016 07/16/2012  Falls in the past year? No No No   Depression Screen PHQ 2/9 Scores 08/06/2017 08/03/2016 08/03/2016 07/16/2012  PHQ - 2 Score 0 0 0 2  PHQ- 9 Score 0 - - -     Cognitive Function MMSE - Mini Mental State Exam 08/06/2017 08/03/2016  Orientation to time 5 5  Orientation to Place 5 5  Registration 3 3  Attention/ Calculation 0 0  Recall 3 3  Language- name 2 objects 0 0  Language- repeat 1 1  Language- follow 3 step command 3 3  Language- read & follow direction 0 0  Write a sentence 0 0  Copy design 0 0  Total score 20 20     PLEASE NOTE: A Mini-Cog screen was completed. Maximum score is 20. A value of 0 denotes this part of [place] MMSE was not completed or the patient failed this part of the Mini-Cog screening.   Mini-Cog Screening Orientation to Time - Max 5 pts Orientation  to Place - Max 5 pts Registration - Max 3 pts Recall - Max 3 pts Language Repeat - Max 1 pts Language Follow 3 Step Command - Max 3 pts     Immunization History  Administered Date(s) Administered  . Influenza Split 04/13/2011, 04/26/2012  . Influenza Whole 04/07/2009, 04/13/2010  . Influenza, High Dose Seasonal PF 04/09/2017  . Influenza,inj,Quad PF,6+ Mos 05/01/2013, 04/30/2014, 04/05/2015, 06/05/2016  . Pneumococcal Conjugate-13 08/06/2014  . Pneumococcal Polysaccharide-23 07/16/2012  . Td 04/04/2006, 02/20/2012  . Zoster 05/02/2007    Screening Tests Health Maintenance  Topic Date Due  . COLONOSCOPY  04/18/2018 (Originally 04/09/2017)  . MAMMOGRAM  10/16/2017  . TETANUS/TDAP  02/19/2022  . INFLUENZA VACCINE  Completed  . DEXA SCAN  Completed  . Hepatitis C Screening  Completed  . PNA vac Low Risk Adult  Completed       Plan:     I have personally reviewed, addressed, and noted the following in the patient's chart:  A. Medical and social history B. Use of alcohol, tobacco or illicit drugs  C. Current medications and supplements D. Functional ability and status E.  Nutritional status F.  Physical activity G. Advance directives H. List of other physicians I.  Hospitalizations, surgeries, and ER visits in previous 12 months J.  [place] to include hearing, vision, cognitive, depression L. Referrals and appointments - none  In addition, I have reviewed and discussed with patient certain preventive protocols, quality metrics, and best practice recommendations. A written personalized care plan for preventive services as well as general preventive health recommendations were provided to patient.  See attached scanned questionnaire for additional information.   Signed,   Lindell Noe, MHA, BS, LPN Health Coach

## 2017-08-06 NOTE — Patient Instructions (Signed)
Ms. Snader , Thank you for taking time to come for your Medicare Wellness Visit. I appreciate your ongoing commitment to your health goals. Please review the following plan we discussed and let me know if I can assist you in the future.   These are the goals we discussed: Goals    . Increase physical activity     Starting 08/10/2017, I will attempt to exercise for 60 minutes twice weekly and to make healthy food choices by eliminating added sugar in an effort to lose weight.        This is a list of the screening recommended for you and due dates:  Health Maintenance  Topic Date Due  . Colon Cancer Screening  04/18/2018*  . Mammogram  10/16/2017  . Tetanus Vaccine  02/19/2022  . Flu Shot  Completed  . DEXA scan (bone density measurement)  Completed  .  Hepatitis C: One time screening is recommended by Center for Disease Control  (CDC) for  adults born from [birth year] through 1964.   Completed  . Pneumonia vaccines  Completed  *Topic was postponed. The date shown is not the original due date.   Preventive Care for Adults  A healthy lifestyle and preventive care can promote health and wellness. Preventive health guidelines for adults include the following key practices.  . A routine yearly physical is a good way to check with your health care provider about your health and preventive screening. It is a chance to share any concerns and updates on your health and to receive a thorough exam.  . Visit your dentist for a routine exam and preventive care every 6 months. Brush your teeth twice a day and floss once a day. Good oral hygiene prevents tooth decay and gum disease.  . The frequency of eye exams is based on your age, health, family medical history, use  of contact lenses, and other factors. Follow your health care provider's recommendations for frequency of eye exams.  . Eat a healthy diet. Foods like vegetables, fruits, whole grains, low-fat dairy products, and lean protein foods  contain the nutrients you need without too many calories. Decrease your intake of foods high in solid fats, added sugars, and salt. Eat the right amount of calories for you. Get information about a proper diet from your health care provider, if necessary.  . Regular physical exercise is one of the most important things you can do for your health. Most adults should get at least 150 minutes of moderate-intensity exercise (any activity that increases your heart rate and causes you to sweat) each week. In addition, most adults need muscle-strengthening exercises on 2 or more days a week.  Silver Sneakers may be a benefit available to you. To determine eligibility, you may visit the website: www.silversneakers.com or contact program at (732)308-5539 Mon-Fri between 8AM-8PM.   . Maintain a healthy weight. The body mass index (BMI) is a screening tool to identify possible weight problems. It provides an estimate of body fat based on height and weight. Your health care provider can find your BMI and can help you achieve or maintain a healthy weight.   For adults 20 years and older: ? A BMI below 18.5 is considered underweight. ? A BMI of 18.5 to 24.9 is normal. ? A BMI of 25 to 29.9 is considered overweight. ? A BMI of 30 and above is considered obese.   . Maintain normal blood lipids and cholesterol levels by exercising and minimizing your intake of saturated fat.  Eat a balanced diet with plenty of fruit and vegetables. Blood tests for lipids and cholesterol should begin at age 80 and be repeated every 5 years. If your lipid or cholesterol levels are high, you are over 50, or you are at high risk for heart disease, you may need your cholesterol levels checked more frequently. Ongoing high lipid and cholesterol levels should be treated with medicines if diet and exercise are not working.  . If you smoke, find out from your health care provider how to quit. If you do not use tobacco, please do not  start.  . If you choose to drink alcohol, please do not consume more than 2 drinks per day. One drink is considered to be 12 ounces (355 mL) of beer, 5 ounces (148 mL) of wine, or 1.5 ounces (44 mL) of liquor.  . If you are 69-53 years old, ask your health care provider if you should take aspirin to prevent strokes.  . Use sunscreen. Apply sunscreen liberally and repeatedly throughout the day. You should seek shade when your shadow is shorter than you. Protect yourself by wearing long sleeves, pants, a wide-brimmed hat, and sunglasses year round, whenever you are outdoors.  . Once a month, do a whole body skin exam, using a mirror to look at the skin on your back. Tell your health care provider of new moles, moles that have irregular borders, moles that are larger than a pencil eraser, or moles that have changed in shape or color.

## 2017-08-06 NOTE — Progress Notes (Signed)
PCP notes:   Health maintenance:  Colonoscopy - addressed; pt plans to schedule future appt  Abnormal screenings:   Hearing - failed  Hearing Screening   125Hz  250Hz  500Hz  1000Hz  2000Hz  3000Hz  4000Hz  6000Hz  8000Hz   Right ear:   40 40 40  0    Left ear:   40 40 40  0     Patient concerns:   Right hip pain affects sleep. Stretching exercises related trochanteric bursitis provided to patient.   Nurse concerns:  None  Next PCP appt:   08/13/17 @ 1045  I reviewed health advisor's note, was available for consultation on the day of service listed in this note, and agree with documentation and plan. Elsie Stain, MD.

## 2017-08-06 NOTE — Telephone Encounter (Signed)
Patient in office today for AWV. Patient requested refill of Drisdol, Promethazine HCl, and Tramadol HCl.   Note: Completed Vit D lab test today as ordered.

## 2017-08-06 NOTE — Progress Notes (Signed)
Pre visit review using our clinic review tool, if applicable. No additional management support is needed unless otherwise documented below in the visit note. 

## 2017-08-07 ENCOUNTER — Telehealth: Payer: Self-pay | Admitting: Family Medicine

## 2017-08-07 MED ORDER — VITAMIN D (ERGOCALCIFEROL) 1.25 MG (50000 UNIT) PO CAPS: 50000.0000 [IU] | ORAL_CAPSULE | ORAL | 0 refills | Status: DC

## 2017-08-07 MED ORDER — TRAMADOL HCL 50 MG PO TABS: 50.0000 mg | ORAL_TABLET | Freq: Three times a day (TID) | ORAL | 1 refills | Status: DC | PRN

## 2017-08-07 MED ORDER — PROMETHAZINE HCL 25 MG PO TABS: 25.0000 mg | ORAL_TABLET | Freq: Four times a day (QID) | ORAL | 3 refills | Status: DC | PRN

## 2017-08-07 NOTE — Telephone Encounter (Signed)
Sent. Thanks.   

## 2017-08-07 NOTE — Telephone Encounter (Signed)
Call returned to [facility] and info given.

## 2017-08-07 NOTE — Telephone Encounter (Signed)
Copied from [place] [id number]. Topic: General - Other >> Aug 07, 2017  8:14 AM Darl Householder, RMA wrote: Reason for CRM: Please call pharmacy for medication clarification for Tramadol 50 mg, [facility], pharmacy is also requesting a diagnostic code

## 2017-08-09 DIAGNOSIS — D225 Melanocytic nevi of trunk: Secondary | ICD-10-CM | POA: Diagnosis not present

## 2017-08-09 DIAGNOSIS — D2271 Melanocytic nevi of right lower limb, including hip: Secondary | ICD-10-CM | POA: Diagnosis not present

## 2017-08-09 DIAGNOSIS — L239 Allergic contact dermatitis, unspecified cause: Secondary | ICD-10-CM | POA: Diagnosis not present

## 2017-08-09 DIAGNOSIS — Z85828 Personal history of other malignant neoplasm of skin: Secondary | ICD-10-CM | POA: Diagnosis not present

## 2017-08-13 ENCOUNTER — Encounter: Payer: Self-pay | Admitting: *Deleted

## 2017-08-13 ENCOUNTER — Encounter: Payer: Self-pay | Admitting: Family Medicine

## 2017-08-13 ENCOUNTER — Ambulatory Visit (INDEPENDENT_AMBULATORY_CARE_PROVIDER_SITE_OTHER): Payer: PPO | Admitting: Family Medicine

## 2017-08-13 ENCOUNTER — Telehealth: Payer: Self-pay | Admitting: *Deleted

## 2017-08-13 ENCOUNTER — Ambulatory Visit: Payer: PPO

## 2017-08-13 VITALS — BP 124/80 | HR 65 | Temp 97.7°F | Ht 64.0 in | Wt 236.8 lb

## 2017-08-13 DIAGNOSIS — M7061 Trochanteric bursitis, right hip: Secondary | ICD-10-CM | POA: Diagnosis not present

## 2017-08-13 DIAGNOSIS — K219 Gastro-esophageal reflux disease without esophagitis: Secondary | ICD-10-CM | POA: Diagnosis not present

## 2017-08-13 DIAGNOSIS — Z0000 Encounter for general adult medical examination without abnormal findings: Secondary | ICD-10-CM

## 2017-08-13 DIAGNOSIS — I480 Paroxysmal atrial fibrillation: Secondary | ICD-10-CM | POA: Diagnosis not present

## 2017-08-13 DIAGNOSIS — E7800 Pure hypercholesterolemia, unspecified: Secondary | ICD-10-CM | POA: Diagnosis not present

## 2017-08-13 DIAGNOSIS — E039 Hypothyroidism, unspecified: Secondary | ICD-10-CM | POA: Diagnosis not present

## 2017-08-13 DIAGNOSIS — M810 Age-related osteoporosis without current pathological fracture: Secondary | ICD-10-CM

## 2017-08-13 DIAGNOSIS — E559 Vitamin D deficiency, unspecified: Secondary | ICD-10-CM

## 2017-08-13 DIAGNOSIS — Z7189 Other specified counseling: Secondary | ICD-10-CM

## 2017-08-13 MED ORDER — TRAMADOL HCL 50 MG PO TABS: 50.0000 mg | ORAL_TABLET | Freq: Three times a day (TID) | ORAL | 1 refills | Status: DC | PRN

## 2017-08-13 MED ORDER — OMEPRAZOLE 20 MG PO CPDR: 20.0000 mg | DELAYED_RELEASE_CAPSULE | Freq: Every day | ORAL | 3 refills | Status: DC

## 2017-08-13 MED ORDER — VITAMIN D (ERGOCALCIFEROL) 1.25 MG (50000 UNIT) PO CAPS
50000.0000 [IU] | ORAL_CAPSULE | ORAL | 0 refills | Status: DC
Start: 2017-08-13 — End: 2017-10-25

## 2017-08-13 MED ORDER — LEVOTHYROXINE SODIUM 150 MCG PO TABS: 150.0000 ug | ORAL_TABLET | Freq: Every day | ORAL | 3 refills | Status: DC

## 2017-08-13 NOTE — Assessment & Plan Note (Signed)
GERD controlled with PPI. No ADE on med.  Would continue as is.

## 2017-08-13 NOTE — Patient Instructions (Addendum)
Check on getting back with [place] with the exercises and ice don't help your hip.   Use the hard copy prescriptions if you can't get vit D and tramadol through the main.  Don't change your meds for now.  Take care.  Glad to see you.  Recheck vitamin D in about 3 months at a lab visit.

## 2017-08-13 NOTE — Assessment & Plan Note (Signed)
Living will d/w pt. Would have her daughter Heriberto Antigua designated if incapacitated.

## 2017-08-13 NOTE — Assessment & Plan Note (Signed)
TSh wnl, compliant.  No ADE on med. No neck mass.  No dysphagia.  Continue as is.

## 2017-08-13 NOTE — Telephone Encounter (Signed)
Patient states she is going to [place] in April and wants to know if she needs the Yellow Fever shot?  She says that what she has read says it's not needed in folks over 75 YOA.

## 2017-08-13 NOTE — Assessment & Plan Note (Signed)
Labs d/w pt.  Continue statin.  Encouraged work on diet and exercise as tolerated.

## 2017-08-13 NOTE — Assessment & Plan Note (Addendum)
Advised f/u with Dr. Lorelei Pont if not better with home exercises.  See AVS.  She agrees.  Okay to use tramadol if needed for pain.  rx given to patient.

## 2017-08-13 NOTE — Progress Notes (Signed)
Colonoscopy - she has f/u pending.   Hearing - failed.  D/w pt.  Declined hearing aids.   Mammogram 2018 DXA 2018 with osteoporosis.  Vit D low.  Likely reasonable to defer treatment other than vit D replacement at this point, d/w pt.  She agrees.   Living will d/w pt. Would have her daughter Heriberto Antigua designated if incapacitated. Her husband has parkinson's sx and that is affecting his memory per patient report.    R greater troch area pain was better after injection but then had slow return of sx.  She isn't as bad as prior to injection but not comfortable.  Pain with steps, getting in/out of the car.  Pain laying on R side at night.  She has used tramadol at night w/o ADE.    AF.  Anticoagulated.  Rare episodes of tachycardia, short runs that resolve prior to CCB use.  Used CCB prn.  Still on BB at baseline.  No ADE on meds.  No CP, SOB, BLE edema.    GERD controlled with PPI. No ADE on med.  Would continue as is.    Elevated Cholesterol: Using medications without problems: yes Muscle aches: yes Diet compliance: encouraged, "I've slipped on my diet."  Exercise: encouraged, limited by the hip pain.    Hypothyroidism.  TSh wnl, compliant.  No ADE on med. No neck mass.  No dysphagia.    PMH and SH reviewed  Meds, vitals, and allergies reviewed.   ROS: Per HPI.  Unless specifically indicated otherwise in HPI, the patient denies:  General: fever. Eyes: acute vision changes ENT: sore throat Cardiovascular: chest pain Respiratory: SOB GI: vomiting GU: dysuria Musculoskeletal: acute back pain Derm: acute rash Neuro: acute motor dysfunction Psych: worsening mood Endocrine: polydipsia Heme: bleeding Allergy: hayfever  GEN: nad, alert and oriented HEENT: mucous membranes moist NECK: supple w/o LA, no tmg CV: rrr. PULM: ctab, no inc wob ABD: soft, +bs EXT: no edema SKIN: no acute rash

## 2017-08-13 NOTE — Assessment & Plan Note (Signed)
Anticoagulation rationale d/w pt. Would continue as is with current meds.  She agrees.  Labs d/w pt.  >25 minutes spent in face to face time with patient, >50% spent in counselling or coordination of care discussion AF, meds, labs, HLD, thyroid condition, etc.

## 2017-08-13 NOTE — Assessment & Plan Note (Addendum)
DXA 2018 with osteoporosis.  Vit D low.  Likely reasonable to defer treatment other than vit D replacement at this point, d/w pt.  She agrees.  See orders and AVS. We can consider options when vit D is normal on recheck.

## 2017-08-13 NOTE — Assessment & Plan Note (Signed)
Colonoscopy - she has f/u pending.   Hearing - failed.  D/w pt.  Declined hearing aids.   Mammogram 2018 DXA 2018 with osteoporosis.  Vit D low.  Likely reasonable to defer treatment other than vit D replacement at this point, d/w pt.  She agrees.   Living will d/w pt. Would have her daughter Heriberto Antigua designated if incapacitated. Her husband has parkinson's sx and that is affecting his memory per patient report.

## 2017-08-14 NOTE — Telephone Encounter (Signed)
Pt called - she said she will be going to [place], [place] and will be gone for 2 weeks.  She will be traveling in the local area there.  It is [place], near the [place].  cb is [phone]

## 2017-08-14 NOTE — Telephone Encounter (Signed)
Where in [place] is she going?  How long will she be there? Usually you don't get the yellow fever vaccine after age 71.   Let me know about the above.  Thanks.

## 2017-08-14 NOTE — Telephone Encounter (Signed)
Left detailed message on voicemail to return call with information. 

## 2017-08-16 ENCOUNTER — Telehealth: Payer: Self-pay | Admitting: Family Medicine

## 2017-08-16 ENCOUNTER — Other Ambulatory Visit: Payer: Self-pay | Admitting: Cardiovascular Disease

## 2017-08-16 NOTE — Telephone Encounter (Signed)
Lm on pts vm requesting a call back. Ok to advise pt of Dr Josefine Class advise should she return call

## 2017-08-16 NOTE — Telephone Encounter (Signed)
Copied from [place] [id number]. Topic: Quick Communication - See Telephone Encounter >> Aug 16, 2017  4:05 PM Lolita Rieger, RMA wrote: CRM for notification. See Telephone encounter for:   08/16/17.Misty from [place] called needing clarification on promethizine 25 mg please call her back @ [phone]

## 2017-08-16 NOTE — Telephone Encounter (Signed)
Notify pt.  Shouldn't need yellow fever vaccine or malaria proph in that area, according to data from the CDC.  Hope she has a great trip.  Thanks.

## 2017-08-16 NOTE — Telephone Encounter (Signed)
Advised pt of Dr. Josefine Class message.

## 2017-08-16 NOTE — Telephone Encounter (Signed)
Ok to refill- originally done by Dr. Rockey Situ.

## 2017-08-16 NOTE — Telephone Encounter (Signed)
Please advise if Dr. Rockey Situ would like to refill Propranolol ER 120 for patient.  Looks like Dr. Damita Dunnings is the original prescriber and has been the one refilling for patient.

## 2017-08-17 NOTE — Telephone Encounter (Signed)
Please let me know if you need my help on this.  See below.  Thanks.

## 2017-08-20 ENCOUNTER — Telehealth: Payer: Self-pay | Admitting: *Deleted

## 2017-08-20 ENCOUNTER — Encounter: Payer: Self-pay | Admitting: Physician Assistant

## 2017-08-20 ENCOUNTER — Ambulatory Visit (INDEPENDENT_AMBULATORY_CARE_PROVIDER_SITE_OTHER): Payer: PPO | Admitting: Physician Assistant

## 2017-08-20 VITALS — BP 106/68 | HR 72 | Ht 64.25 in | Wt 238.0 lb

## 2017-08-20 DIAGNOSIS — Z7901 Long term (current) use of anticoagulants: Secondary | ICD-10-CM

## 2017-08-20 DIAGNOSIS — Z800 Family history of malignant neoplasm of digestive organs: Secondary | ICD-10-CM | POA: Diagnosis not present

## 2017-08-20 DIAGNOSIS — Z1211 Encounter for screening for malignant neoplasm of colon: Secondary | ICD-10-CM

## 2017-08-20 MED ORDER — NA SULFATE-K SULFATE-MG SULF 17.5-3.13-1.6 GM/177ML PO SOLN: 1.0000 | Freq: Once | ORAL | 0 refills | Status: AC

## 2017-08-20 MED ORDER — PROMETHAZINE HCL 25 MG PO TABS: 25.0000 mg | ORAL_TABLET | Freq: Four times a day (QID) | ORAL | 1 refills | Status: DC | PRN

## 2017-08-20 NOTE — Telephone Encounter (Signed)
  08/20/2017   RE: Kara Wright DOB: 06/29/1945 MRN: [id number]   Dear Dr. Ida Rogue,    We have scheduled the above patient for an endoscopic procedure. Our records show that she is on anticoagulation therapy.   Please advise as to how long the patient may come off her therapy of Xarelto prior to the procedure, which is scheduled for 10-29-2017.  Please  route the Xarelto clearance  to Cadi Rhinehart CMA .   Sincerely,   Amy Esterwood PA-C

## 2017-08-20 NOTE — Patient Instructions (Addendum)
You have been scheduled for a colonoscopy. Please follow written instructions given to you at your visit today.  Please pick up your prep supplies at the pharmacy within the next 1-3 days. [place] , [place]. If you use inhalers (even only as needed), please bring them with you on the day of your procedure. Your physician has requested that you go to www.startemmi.com and enter the access code given to you at your visit today. This web site gives a general overview about your procedure. However, you should still follow specific instructions given to you by our office regarding your preparation for the procedure.  We will contact you with the Xarelto clearance instructions from Dr. Rockey Situ.   If you are age 93 or older, your body mass index should be between 23-30. Your Body mass index is 40.54 kg/m. If this is out of the aforementioned range listed, please consider follow up with your Primary Care Provider.

## 2017-08-20 NOTE — Telephone Encounter (Signed)
°  promethazine (PHENERGAN) 25 MG tablet  This was sent in as a 30 day supply and needs a 90 day supply   And if any refills   [place]

## 2017-08-20 NOTE — Telephone Encounter (Signed)
Left detailed message for Drue Dun from [facility] to return call to the office. Clarification needed on what [facility] pharmacy is requesting and if the prescription needs to be written for 90 tabs to be dispensed at one time instead of a 30 day supply with additional refills.  Prescription written for Phenergan 25mg  tab was written on 08/07/17 for a 30 day supply with 3 additional refills.

## 2017-08-20 NOTE — Addendum Note (Signed)
Addended by: Tonia Ghent on: 08/20/2017 01:37 PM   Modules accepted: Orders

## 2017-08-20 NOTE — Telephone Encounter (Signed)
Kara Wright called and is requesting a 90 day supply because they are a mail order pharmacy. Told her that would have to be approved by the provider. Will check with provider and call her back if he approves. [phone]

## 2017-08-20 NOTE — Progress Notes (Signed)
Subjective:    Patient ID: Kara Wright, female    DOB: 06/23/1945, 72 y.o.   MRN: [id number]  HPI Kara Wright is a pleasant 72 year old white female known to Dr. Henrene Pastor who comes in today to discuss recall colonoscopy. She was last seen in October 2013 when she had endoscopy and colonoscopy. EGD revealed mild distal esophagitis and mild gastritis. CLOtest was negative. Colonoscopy with finding of mild sigmoid diverticulosis there were no polyps.. Patient also had colonoscopy in 2008 due to heme positive stool, there were no polyps found. She does have family history of colon cancer in her father diagnosed at age 79. She has no current complaints. She says every now and then she gets a bout of diarrhea which she believes is secondary to being postcholecystectomy. She says she has a lot of diarrhea she may see a small amount of bright red blood on the tissue. Patient is on chronic Xarelto with history of atrial fibrillation. She's managed by Dr. Rockey Situ Also with hypertension, history of TIA, coronary artery disease, GERD, and morbid obesity.  Review of Systems Pertinent positive and negative review of systems were noted in the above HPI section.  All other review of systems was otherwise negative.  Outpatient Encounter Medications as of 08/20/2017  Medication Sig  . albuterol (PROVENTIL HFA;VENTOLIN HFA) 108 (90 Base) MCG/ACT inhaler Inhale 1-2 puffs into the lungs every 6 (six) hours as needed for wheezing or shortness of breath.  . diltiazem (CARDIZEM) 30 MG tablet Take 1 tablet (30 mg total) by mouth 3 (three) times daily as needed.  . diphenhydrAMINE (BENADRYL) 25 MG tablet Take 25 mg by mouth every 8 (eight) hours as needed for itching.  . levothyroxine (SYNTHROID, LEVOTHROID) 150 MCG tablet Take 1 tablet (150 mcg total) by mouth daily. Except for 1.5 on Sunday  . omeprazole (PRILOSEC) 20 MG capsule Take 1 capsule (20 mg total) by mouth daily.  . promethazine (PHENERGAN) 25 MG tablet Take 1  tablet (25 mg total) by mouth every 6 (six) hours as needed. For nausea  . propranolol ER (INDERAL LA) 120 MG 24 hr capsule Take 1 capsule by mouth every day  . rosuvastatin (CRESTOR) 5 MG tablet Take 1 tablet (5 mg total) by mouth at bedtime.  . traMADol (ULTRAM) 50 MG tablet Take 1 tablet (50 mg total) by mouth every 8 (eight) hours as needed (sedation caution).  . Vitamin D, Ergocalciferol, (DRISDOL) 50000 units CAPS capsule Take 1 capsule (50,000 Units total) by mouth every 7 (seven) days.  Alveda Reasons 20 MG TABS tablet TAKE 1 TABLET BY MOUTH ONCE DAILY WITH  SUPPER  . Na Sulfate-K Sulfate-Mg Sulf 17.5-3.13-1.6 GM/177ML SOLN Take 1 kit by mouth once for 1 dose.   No facility-administered encounter medications on file as of 08/20/2017.    Allergies  Allergen Reactions  . Celebrex [Celecoxib]     REACTION: nausea: GI UPSET  . Gold-Containing Drug Products   . Nickel     Skin rash, allergy  . Other     Fragrance, phenylenediamine, contrast metal agents, gold  . Zocor [Simvastatin]     Aches but tolerates crestor   Patient Active Problem List   Diagnosis Date Noted  . Health care maintenance 08/13/2017  . Vitamin D deficiency 04/10/2017  . Greater trochanteric bursitis 04/10/2017  . 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[place]) 11/01/2016  . UTI (urinary tract infection) 10/26/2016  . Low back pain 08/04/2016  . Dysuria 09/02/2015  .  Morbid obesity ([place]) 04/05/2015  . Advance care planning 08/07/2014  . Anxiety 07/16/2012  . Esophagitis 04/17/2012  . 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 TIA (transient ischemic attack) 08/27/2011  . Medicare annual wellness visit, initial 06/25/2011  . Atrial fibrillation ([place]) 10/31/2010  . HTN (hypertension) 10/31/2010  . 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 Hypothyroidism 12/15/2006  . HLD (hyperlipidemia) 12/15/2006  . MIGRAINE HEADACHE 12/15/2006  . GLAUCOMA 12/15/2006  . GERD (gastroesophageal reflux  disease) 12/15/2006  . DIVERTICULOSIS, COLON 12/15/2006  . Osteoporosis 12/15/2006   Social History   Socioeconomic History  . Marital status: Married    Spouse name: Not on file  . Number of children: 2  . Years of education: Not on file  . Highest education level: Not on file  Social Needs  . Financial resource strain: Not on file  . Food insecurity - worry: Not on file  . Food insecurity - inability: Not on file  . Transportation needs - medical: Not on file  . Transportation needs - non-medical: Not on file  Occupational History  . Occupation: Ecologist: RETIRED    Comment: [employer]  . Occupation: Barrister's clerk: [employer]  Tobacco Use  . Smoking status: Former Smoker    Last attempt to quit: 06/26/1986    Years since quitting: 31.1  . Smokeless tobacco: Never Used  Substance and Sexual Activity  . Alcohol use: No  . Drug use: No  . Sexual activity: Not Currently  Other Topics Concern  . Not on file  Social History Narrative   Works part time at [employer] office   Married 1979   2 daughters from prev relationship   [employer] fan, [employer] fan.    Ms. Hattabaugh family history includes Allergies in her sister; Breast cancer in her cousin; Colon cancer (age of onset: 28) in her father; Hypertension in her mother; Osteopenia in her mother; Stroke in her mother.      Objective:    Vitals:   08/20/17 1028  BP: 106/68  Pulse: 72    Physical Exam well-developed older white female in no acute distress, pleasant blood pressure 106/68 pulse 78, height 5 foot 4, weight 238, BMI 40.5. HEENT ;nontraumatic normocephalic EOMI PERRLA sclera anicteric, Cardiovascular; regular rate and rhythm with S1-S2 no murmur or gallop, Pulmonary ;clear bilaterally, Abdomen; obese, soft nontender nondistended bowel sounds are active there is no palpable mass or hepatosplenomegaly, Rectal; exam not done, Ext; no clubbing cyanosis or edema  skin warm and dry, Neuropsych; mood and affect appropriate       Assessment & Plan:   #56 72 year old white female due for follow-up colonoscopy. Patient has positive family history of colon cancer in her father, her last colon was done in October 2013 with finding of diverticulosis but no polyps. She is currently asymptomatic #2 chronic anticoagulation-on Xarelto #3 history of atrial fibrillation #4 history of TIA #5 coronary artery disease #6 GERD #7 morbid obesity-BMI 40.5  Plan; Patient will be scheduled for colonoscopy with Dr. Henrene Pastor. Procedure was discussed in detail with the patient today including indications risks and benefits and she is agreeable to proceed. Patient will need to hold Xarelto for 24 hours prior to procedure. We will communicate with her cardiologist Dr. Rockey Situ to assure that this is reasonable for this patient.  Sutter Ahlgren S Katya Rolston PA-C 08/20/2017   Cc: Tonia Ghent, MD

## 2017-08-20 NOTE — Telephone Encounter (Signed)
Patient with diagnosis of Afib on Xarelto for anticoagulation.    Procedure: endoscopy Date of procedure: 10/29/17  CHADS2-VASc score of  6 (CHF, HTN, AGE, DM2, stroke/tia x 2, CAD, AGE, female)  CrCl 144ml/min   Per office protocol, patient can hold Xarelto for 24 hours prior to procedure. Given history of TIA would recommend resume Xarelto as soon as safe after procedure.

## 2017-08-20 NOTE — Telephone Encounter (Signed)
Resent.  Thanks.

## 2017-08-21 NOTE — Telephone Encounter (Signed)
Informed husband, Ayva Veilleux, that she can hold the Xarelto 5-12, 5-13. She can resume it as soon as safe after the procedure. I informed that Dr. Henrene Pastor will let her know that, post procedure. Vicenta Olds acknowledged understanding the instructions.

## 2017-08-21 NOTE — Telephone Encounter (Signed)
Received fax from [facility]. LM for the patient to hold the Xarelto on 5-12, 5-13, and resume Xarelto as soon as safe after the colonoscopy. Dr. Henrene Pastor will advise her of this.

## 2017-08-22 ENCOUNTER — Telehealth: Payer: Self-pay

## 2017-08-22 NOTE — Telephone Encounter (Signed)
Completed PA on CMM.  There was a note stating Formulary non-HRM alternatives are as follows: Nausea/Vomiting: granisetron, ondansetron. Allergic Reactions: levocetirizine  Waiting on response to be faxed

## 2017-08-27 NOTE — Progress Notes (Signed)
Assessment and plan was reviewed. The decision regarding her anticoagulation will be left to her cardiologist. If she is too high risk to come off Xarelto short-term then we can perform the examination on her oral anticoagulant

## 2017-09-04 ENCOUNTER — Telehealth: Payer: Self-pay | Admitting: *Deleted

## 2017-09-04 NOTE — Telephone Encounter (Signed)
Called the patient to advise we have a change in her Xarelto clearance instructions.  Per Dr. Henrene Pastor, he wants her to hold the Xarelto, May 11th,12th, and 13 th. Dr Henrene Pastor will tell her when to resume it.   The patient verbalized understanding her instructions.

## 2017-10-15 ENCOUNTER — Encounter: Payer: Self-pay | Admitting: Internal Medicine

## 2017-10-22 ENCOUNTER — Ambulatory Visit: Payer: PPO | Admitting: Family Medicine

## 2017-10-22 ENCOUNTER — Telehealth: Payer: Self-pay

## 2017-10-22 DIAGNOSIS — Z0289 Encounter for other administrative examinations: Secondary | ICD-10-CM

## 2017-10-22 NOTE — Telephone Encounter (Signed)
PLEASE NOTE: All timestamps contained within this report are represented as [place] Standard Time. CONFIDENTIALTY NOTICE: This fax transmission is intended only for the addressee. It contains information that is legally privileged, confidential or otherwise protected from use or disclosure. If you are not the intended recipient, you are strictly prohibited from reviewing, disclosing, copying using or disseminating any of this information or taking any action in reliance on or regarding this information. If you have received this fax in error, please notify us immediately by telephone so that we can arrange for its return to [place]. Phone: [phone], Toll-Free: [phone], Fax: [phone] Page: 1 of 1 Call Id: [id number] [facility] - Client Nonclinical Telephone Record [facility] - Client Client Site [facility] - PHYSICIAN, Verita Schneiders- MD Contact Type Call Who Is Calling Patient / Member / Family / Caregiver Caller Name jlyn cerros Caller Phone Number [phone] Patient Name Kara Wright Patient DOB 22-Nov-2045 Call Type Message Only Information Provided Reason for Call Request to Schedule Office Appointment Initial Comment Caller states she needs to reschedule her appt. Additional Comment Call Closed By: Sarajane Marek Transaction Date/Time: 10/19/2017 6:17:09 PM (ET)

## 2017-10-25 ENCOUNTER — Ambulatory Visit (INDEPENDENT_AMBULATORY_CARE_PROVIDER_SITE_OTHER): Payer: PPO | Admitting: Family Medicine

## 2017-10-25 ENCOUNTER — Telehealth: Payer: Self-pay | Admitting: Physician Assistant

## 2017-10-25 ENCOUNTER — Encounter: Payer: Self-pay | Admitting: Family Medicine

## 2017-10-25 DIAGNOSIS — M810 Age-related osteoporosis without current pathological fracture: Secondary | ICD-10-CM

## 2017-10-25 DIAGNOSIS — E559 Vitamin D deficiency, unspecified: Secondary | ICD-10-CM | POA: Diagnosis not present

## 2017-10-25 DIAGNOSIS — R05 Cough: Secondary | ICD-10-CM

## 2017-10-25 DIAGNOSIS — R059 Cough, unspecified: Secondary | ICD-10-CM

## 2017-10-25 LAB — VITAMIN D 25 HYDROXY (VIT D DEFICIENCY, FRACTURES): VITD: 30.49 ng/mL (ref 30.00–100.00)

## 2017-10-25 MED ORDER — AMOXICILLIN-POT CLAVULANATE 875-125 MG PO TABS: 1.0000 | ORAL_TABLET | Freq: Two times a day (BID) | ORAL | 0 refills | Status: DC

## 2017-10-25 MED ORDER — BENZONATATE 200 MG PO CAPS: 200.0000 mg | ORAL_CAPSULE | Freq: Three times a day (TID) | ORAL | 1 refills | Status: DC | PRN

## 2017-10-25 NOTE — Telephone Encounter (Signed)
Patient states she has a procedure on Monday 5.13.19 and was just diagnosed with bronchitis. Pt states she is starting her antibiotics today and wants to know if she can still have procedure on Monday or should she reschedule. Pt had ov 08-20-2017.

## 2017-10-25 NOTE — Telephone Encounter (Signed)
Spoke with the patient. She was seen by her PCP office this AM. Afebrile. No body aches. If she "breathes deep" she will cough. Her antibiotic was changed to Augmentin. She is scheduled for her colonoscopy on Monday. It is a screening colonoscopy off of Xarelto.

## 2017-10-25 NOTE — Patient Instructions (Addendum)
Go to the lab on the way out.  We'll contact you with your lab report. Presumed bronchitis.  Call the dentist.  In the meantime, stop amoxil and start augmentin.  Okay to take mucinex DM. Update me as needed.  Take care.  Glad to see you.

## 2017-10-25 NOTE — Telephone Encounter (Signed)
Okay to proceed. Hopefully she will for better on antibiotics. Obviously, if she got significantly worse by Sunday she could cancel.

## 2017-10-25 NOTE — Progress Notes (Signed)
Due for recheck vit D.  She is on amoxil 500mg  QID for recent dental work.  She is having an implant done come up with the initial work done.    Sx started about 10 days.  She was in [place], had a good trip other than getting sick on the last 2 days of the trip. Coughing, chest sore from cough.  No fevers known but not checked.  Some sputum, thick, tan colored.  No vomiting, no diarrhea except initially with food changes (more fruit on the trip).    No new rash.  No ear pain now but prev with some pain.  Prev ST is better today.  Some wheeze.    She is taking mucinex.    Meds, vitals, and allergies reviewed.   ROS: Per HPI unless specifically indicated in ROS section   GEN: nad, alert and oriented HEENT: mucous membranes moist, tm w/o erythema, nasal exam w/o erythema, clear discharge noted,  OP with cobblestoning, R max sinus minimally ttp  NECK: supple w/o LA CV: rrr.   PULM: ctab, no inc wob EXT: no edema SKIN: well perfused.

## 2017-10-26 NOTE — Telephone Encounter (Signed)
Left a message for the patient with this information.

## 2017-10-28 DIAGNOSIS — R05 Cough: Secondary | ICD-10-CM | POA: Insufficient documentation

## 2017-10-28 DIAGNOSIS — R059 Cough, unspecified: Secondary | ICD-10-CM | POA: Insufficient documentation

## 2017-10-28 NOTE — Assessment & Plan Note (Signed)
See notes on vit D report.

## 2017-10-28 NOTE — Assessment & Plan Note (Addendum)
Presumed bronchitis. D/w pt about taking tessalon if needed for cough. She'll call her dentist re: abx change.  In the meantime, stop amoxil and start augmentin.  Okay to take mucinex DM. Update me as needed.  She agrees.

## 2017-10-29 ENCOUNTER — Ambulatory Visit (AMBULATORY_SURGERY_CENTER): Payer: PPO | Admitting: Internal Medicine

## 2017-10-29 ENCOUNTER — Other Ambulatory Visit: Payer: Self-pay

## 2017-10-29 ENCOUNTER — Encounter: Payer: Self-pay | Admitting: Internal Medicine

## 2017-10-29 VITALS — BP 132/64 | HR 66 | Temp 98.2°F | Resp 15 | Ht 64.0 in | Wt 238.0 lb

## 2017-10-29 DIAGNOSIS — Z800 Family history of malignant neoplasm of digestive organs: Secondary | ICD-10-CM | POA: Diagnosis not present

## 2017-10-29 DIAGNOSIS — D124 Benign neoplasm of descending colon: Secondary | ICD-10-CM | POA: Diagnosis not present

## 2017-10-29 DIAGNOSIS — Z8673 Personal history of transient ischemic attack (TIA), and cerebral infarction without residual deficits: Secondary | ICD-10-CM | POA: Diagnosis not present

## 2017-10-29 DIAGNOSIS — Z1211 Encounter for screening for malignant neoplasm of colon: Secondary | ICD-10-CM

## 2017-10-29 DIAGNOSIS — I10 Essential (primary) hypertension: Secondary | ICD-10-CM | POA: Diagnosis not present

## 2017-10-29 DIAGNOSIS — K219 Gastro-esophageal reflux disease without esophagitis: Secondary | ICD-10-CM | POA: Diagnosis not present

## 2017-10-29 MED ORDER — SODIUM CHLORIDE 0.9 % IV SOLN: 500.0000 mL | Freq: Once | INTRAVENOUS | Status: DC

## 2017-10-29 NOTE — Op Note (Signed)
[facility] Patient Name: Kara Wright Procedure Date: 10/29/2017 1:07 PM MRN: [id number] Endoscopist: Docia Chuck. Henrene Pastor , MD Age: 72 Referring MD:  Date of Birth: January 02, 1946 Gender: Female Account #: [id number] Procedure:                Colonoscopy, with cold snare polypectomy x 1 Indications:              Screening in patient at increased risk: Colorectal                            cancer in father before age 77. Previous                            examinations 2008 and 2013 negative for neoplasia Medicines:                Monitored Anesthesia Care Procedure:                Pre-Anesthesia Assessment:                           - Prior to the procedure, a History and Physical                            was performed, and patient medications and                            allergies were reviewed. The patient's tolerance of                            previous anesthesia was also reviewed. The risks                            and benefits of the procedure and the sedation                            options and risks were discussed with the patient.                            All questions were answered, and informed consent                            was obtained. Prior Anticoagulants: The patient has                            taken Xarelto (rivaroxaban), last dose was 3 days                            prior to procedure. ASA Grade Assessment: III - A                            patient with severe systemic disease. After                            reviewing the risks and benefits, the patient was  deemed in satisfactory condition to undergo the                            procedure.                           After obtaining informed consent, the colonoscope                            was passed under direct vision. Throughout the                            procedure, the patient's blood pressure, pulse, and                            oxygen saturations  were monitored continuously. The                            Colonoscope was introduced through the anus and                            advanced to the the cecum, identified by                            appendiceal orifice and ileocecal valve. The                            ileocecal valve, appendiceal orifice, and rectum                            were photographed. The quality of the bowel                            preparation was excellent. The colonoscopy was                            performed without difficulty. The patient tolerated                            the procedure well. The bowel preparation used was                            SUPREP. Scope In: 1:20:40 PM Scope Out: 1:34:55 PM Scope Withdrawal Time: 0 hours 11 minutes 45 seconds  Total Procedure Duration: 0 hours 14 minutes 15 seconds  Findings:                 A 2 mm polyp was found in the descending colon. The                            polyp was removed with a cold snare. Resection and                            retrieval were complete.  Multiple small and large-mouthed diverticula were                            found in the sigmoid colon and right colon.                           Internal hemorrhoids were found during retroflexion.                           The exam was otherwise without abnormality on                            direct and retroflexion views. Complications:            No immediate complications. Estimated blood loss:                            None. Estimated Blood Loss:     Estimated blood loss: none. Impression:               - One 2 mm polyp in the descending colon, removed                            with a cold snare. Resected and retrieved.                           - Diverticulosis in the sigmoid colon and in the                            right colon.                           - Internal hemorrhoids.                           - The examination was otherwise normal  on direct                            and retroflexion views. Recommendation:           - Repeat colonoscopy in 5 years for surveillance.                           - Resume Xarelto (rivaroxaban) today at prior dose.                           - Patient has a contact number available for                            emergencies. The signs and symptoms of potential                            delayed complications were discussed with the                            patient. Return to normal activities tomorrow.  Written discharge instructions were provided to the                            patient.                           - Resume previous diet.                           - Continue present medications.                           - Await pathology results. Docia Chuck. Henrene Pastor, MD 10/29/2017 1:39:48 PM This report has been signed electronically.

## 2017-10-29 NOTE — Progress Notes (Signed)
Clients last dose of xerelto was 10/26/17

## 2017-10-29 NOTE — Progress Notes (Signed)
Called to room to assist during endoscopic procedure.  Patient ID and intended procedure confirmed with present staff. Received instructions for my participation in the procedure from the performing physician.  

## 2017-10-29 NOTE — Progress Notes (Signed)
Report to PACU, RN, vss, BBS= Clear.  

## 2017-10-29 NOTE — Patient Instructions (Addendum)
  Thank you for allowing [place] to care for you today!  Await pathology results by mail  approx 2 weeks.  Handouts given for polyps, diverticulosis, and hemorrhoids.  Resume Xarelto today at prior dose.      YOU HAD AN ENDOSCOPIC PROCEDURE TODAY AT [facility]:   Refer to the procedure report that was given to you for any specific questions about what was found during the examination.  If the procedure report does not answer your questions, please call your gastroenterologist to clarify.  If you requested that your care partner not be given the details of your procedure findings, then the procedure report has been included in a sealed envelope for you to review at your convenience later.  YOU SHOULD EXPECT: Some feelings of bloating in the abdomen. Passage of more gas than usual.  Walking can help get rid of the air that was put into your GI tract during the procedure and reduce the bloating. If you had a lower endoscopy (such as a colonoscopy or flexible sigmoidoscopy) you may notice spotting of blood in your stool or on the toilet paper. If you underwent a bowel prep for your procedure, you may not have a normal bowel movement for a few days.  Please Note:  You might notice some irritation and congestion in your nose or some drainage.  This is from the oxygen used during your procedure.  There is no need for concern and it should clear up in a day or so.  SYMPTOMS TO REPORT IMMEDIATELY:   Following lower endoscopy (colonoscopy or flexible sigmoidoscopy):  Excessive amounts of blood in the stool  Significant tenderness or worsening of abdominal pains  Swelling of the abdomen that is new, acute  Fever of 100F or higher   For urgent or emergent issues, a gastroenterologist can be reached at any hour by calling [phone].   DIET:  We do recommend a small meal at first, but then you may proceed to your regular diet.  Drink plenty of fluids but you should avoid alcoholic  beverages for 24 hours.  ACTIVITY:  You should plan to take it easy for the rest of today and you should NOT DRIVE or use heavy machinery until tomorrow (because of the sedation medicines used during the test).    FOLLOW UP: Our staff will call the number listed on your records the next business day following your procedure to check on you and address any questions or concerns that you may have regarding the information given to you following your procedure. If we do not reach you, we will leave a message.  However, if you are feeling well and you are not experiencing any problems, there is no need to return our call.  We will assume that you have returned to your regular daily activities without incident.  If any biopsies were taken you will be contacted by phone or by letter within the next 1-3 weeks.  Please call us at [phone] if you have not heard about the biopsies in 3 weeks.    SIGNATURES/CONFIDENTIALITY: You and/or your care partner have signed paperwork which will be entered into your electronic medical record.  These signatures attest to the fact that that the information above on your After Visit Summary has been reviewed and is understood.  Full responsibility of the confidentiality of this discharge information lies with you and/or your care-partner.

## 2017-10-30 ENCOUNTER — Telehealth: Payer: Self-pay | Admitting: *Deleted

## 2017-10-30 NOTE — Telephone Encounter (Signed)
  Follow up Call-  Call back number 10/29/2017  Post procedure Call Back phone  # [phone]  Permission to leave phone message Yes  Some recent data might be hidden     Patient questions:  Do you have a fever, pain , or abdominal swelling? No. Pain Score  0 *  Have you tolerated food without any problems? Yes.    Have you been able to return to your normal activities? Yes.    Do you have any questions about your discharge instructions: Diet   No. Medications  No. Follow up visit  No.  Do you have questions or concerns about your Care? No.  Actions: * If pain score is 4 or above: No action needed, pain <4.

## 2017-11-01 ENCOUNTER — Encounter: Payer: Self-pay | Admitting: Internal Medicine

## 2017-11-08 ENCOUNTER — Telehealth: Payer: Self-pay | Admitting: Family Medicine

## 2017-11-08 ENCOUNTER — Ambulatory Visit: Payer: Self-pay | Admitting: *Deleted

## 2017-11-08 NOTE — Telephone Encounter (Signed)
Converted to triage encounter

## 2017-11-08 NOTE — Telephone Encounter (Signed)
  Reason for Disposition . Cough has been present for > 3 weeks  Answer Assessment - Initial Assessment Questions 1. ONSET: "When did the cough begin?"         Approx 3 weeks  Ago   2. SEVERITY: "How bad is the cough today?"           Mod    3. RESPIRATORY DISTRESS: "Describe your breathing."            Ok   4. FEVER: "Do you have a fever?" If so, ask: "What is your temperature, how was it measured, and when did it start?"           No  Chills   5. SPUTUM: "Describe the color of your sputum" (clear, white, yellow, green)        Clear  With yellowish tint color  6. HEMOPTYSIS: "Are you coughing up any blood?" If so ask: "How much?" (flecks, streaks, tablespoons, etc.)      no 7. CARDIAC HISTORY: "Do you have any history of heart disease?" (e.g., heart attack, congestive heart failure)          A fib  8. LUNG HISTORY: "Do you have any history of lung disease?"  (e.g., pulmonary embolus, asthma, emphysema)         No  9. PE RISK FACTORS: "Do you have a history of blood clots?" (or: recent major surgery, recent prolonged travel, bedridden )      xarelto for a fib   10. OTHER SYMPTOMS: "Do you have any other symptoms?" (e.g., runny nose, wheezing, chest pain)        sorethroat  Runny nose   11. PREGNANCY: "Is there any chance you are pregnant?" "When was your last menstrual period?"       n/a 12. TRAVEL: "Have you traveled out of the country in the last month?" (e.g., travel history, exposures)       [place]  Protocols used: [place]

## 2017-11-08 NOTE — Telephone Encounter (Signed)
Copied from [facility] [id number]. Topic: Quick Communication - See Telephone Encounter >> Nov 08, 2017  4:20 PM Boyd Kerbs wrote: CRM for notification. See Telephone encounter for: 11/08/17.  Pt . Was in 5/9 with bronchitis and took all medication and then a couple days later got it back.  Asking if can call in another prescription  Is needing it asap  [facility] [address] [place] [address] Phone: [phone] Fax: [phone]

## 2017-11-08 NOTE — Telephone Encounter (Signed)
Called pt and left a voicemail to call back and discuss symptoms

## 2017-11-08 NOTE — Telephone Encounter (Signed)
Pt  Seen for bronchitis 10/25/2017 treated with course of  anti biotics . Pt reports got better and several  days later cough came back. She has a sorethroat as well.She reports the cough is productive at times. Appointment made for tomorrow with Dr Damita Dunnings

## 2017-11-09 ENCOUNTER — Ambulatory Visit (INDEPENDENT_AMBULATORY_CARE_PROVIDER_SITE_OTHER): Payer: PPO | Admitting: Family Medicine

## 2017-11-09 ENCOUNTER — Encounter: Payer: Self-pay | Admitting: Family Medicine

## 2017-11-09 DIAGNOSIS — R059 Cough, unspecified: Secondary | ICD-10-CM

## 2017-11-09 DIAGNOSIS — R05 Cough: Secondary | ICD-10-CM | POA: Diagnosis not present

## 2017-11-09 MED ORDER — GUAIFENESIN-CODEINE 100-10 MG/5ML PO SYRP: 5.0000 mL | ORAL_SOLUTION | Freq: Three times a day (TID) | ORAL | 0 refills | Status: DC | PRN

## 2017-11-09 MED ORDER — AMOXICILLIN-POT CLAVULANATE 875-125 MG PO TABS: 1.0000 | ORAL_TABLET | Freq: Two times a day (BID) | ORAL | 0 refills | Status: DC

## 2017-11-09 NOTE — Progress Notes (Signed)
As of 10/25/17, was started on augmentin for a cough.  She got some better in the meantime, had a few good days after the abx were done, then started getting sick about 4 days ago.  Cough with dark sputum, was initially clear.  No known fevers.  Some wheeze.  No vomiting.  No facial pain.  Prev with some ST but that is better.  Prev with L ear pain, better now.  No facial pain.  Not SOB.  She feels weaker and more tired than usual.    Tessalon didn't help much.    Meds, vitals, and allergies reviewed.   ROS: Per HPI unless specifically indicated in ROS section   GEN: nad, alert and oriented HEENT: mucous membranes moist, tm w/o erythema, nasal exam w/o erythema, clear discharge noted,  OP with cobblestoning NECK: supple w/o LA CV: rrr.   PULM: ctab, no inc wob EXT: no edema SKIN: well perfused.

## 2017-11-09 NOTE — Patient Instructions (Signed)
Rest and fluids for now.  Use the cough syrup as needed- sedation caution.  If not better in a few days, then start the antibiotics.  Take care.  Glad to see you.  Update me as needed.  Your lungs are clear.

## 2017-11-11 NOTE — Assessment & Plan Note (Signed)
She has been sick for about 4 days with the most recent episode.  She clearly had an interval between this episode and the last illness where she was feeling better.  Nontoxic.  Okay for outpatient follow-up.  Lungs are clear.  Discussed with patient about options.  She could just have a benign viral process. Rest and fluids for now.  Use Cheratussin cough syrup as needed- sedation caution.  If not better in a few days, then start the Augmentin but I suspect she will not need to do this.  She agrees with plan.  All questions answered.

## 2017-11-15 ENCOUNTER — Other Ambulatory Visit: Payer: PPO

## 2017-11-15 ENCOUNTER — Other Ambulatory Visit: Payer: Self-pay | Admitting: Family Medicine

## 2017-11-15 DIAGNOSIS — R739 Hyperglycemia, unspecified: Secondary | ICD-10-CM

## 2017-11-28 ENCOUNTER — Other Ambulatory Visit: Payer: Self-pay | Admitting: Cardiovascular Disease

## 2017-12-19 ENCOUNTER — Other Ambulatory Visit: Payer: Self-pay

## 2017-12-19 MED ORDER — RIVAROXABAN 20 MG PO TABS: ORAL_TABLET | ORAL | 3 refills | Status: DC

## 2017-12-21 ENCOUNTER — Other Ambulatory Visit: Payer: Self-pay | Admitting: Family Medicine

## 2017-12-21 DIAGNOSIS — Z1231 Encounter for screening mammogram for malignant neoplasm of breast: Secondary | ICD-10-CM

## 2017-12-22 ENCOUNTER — Other Ambulatory Visit: Payer: Self-pay | Admitting: Family Medicine

## 2017-12-24 NOTE — Telephone Encounter (Signed)
Ok to refill? Electronically refill request    Last prescribed on 08/20/2017  Last seen on 11/09/2017

## 2017-12-25 NOTE — Telephone Encounter (Signed)
Patient notified as instructed by telephone and verbalized understanding. Patient stated that she has some left, but saw that she had a refill and wanted to get it before it expired. Advised patient again that if she needs it frequently then please schedule a follow-up appointment and she verbalized understanding.

## 2017-12-25 NOTE — Telephone Encounter (Signed)
Sent. Thanks.  If needed frequently then please schedule f/u.

## 2017-12-31 ENCOUNTER — Ambulatory Visit
Admission: RE | Admit: 2017-12-31 | Discharge: 2017-12-31 | Disposition: A | Discharge status: Home / Self Care | Payer: PPO | Source: Ambulatory Visit | Attending: Family Medicine | Admitting: Family Medicine

## 2017-12-31 DIAGNOSIS — Z1231 Encounter for screening mammogram for malignant neoplasm of breast: Secondary | ICD-10-CM | POA: Diagnosis not present

## 2018-01-24 DIAGNOSIS — H2513 Age-related nuclear cataract, bilateral: Secondary | ICD-10-CM | POA: Diagnosis not present

## 2018-01-24 DIAGNOSIS — H40053 Ocular hypertension, bilateral: Secondary | ICD-10-CM | POA: Diagnosis not present

## 2018-01-28 DIAGNOSIS — H2511 Age-related nuclear cataract, right eye: Secondary | ICD-10-CM | POA: Diagnosis not present

## 2018-01-29 ENCOUNTER — Other Ambulatory Visit: Payer: Self-pay

## 2018-01-29 ENCOUNTER — Encounter: Payer: Self-pay | Admitting: *Deleted

## 2018-01-31 NOTE — Discharge Instructions (Signed)

## 2018-02-06 ENCOUNTER — Encounter
Admission: RE | Disposition: A | Discharge status: Home / Self Care | Payer: Self-pay | Source: Ambulatory Visit | Attending: Ophthalmology

## 2018-02-06 ENCOUNTER — Ambulatory Visit: Payer: PPO | Admitting: Anesthesiology

## 2018-02-06 ENCOUNTER — Ambulatory Visit
Admission: RE | Admit: 2018-02-06 | Discharge: 2018-02-06 | Disposition: A | Discharge status: Home / Self Care | Payer: PPO | Source: Ambulatory Visit | Attending: Ophthalmology | Admitting: Ophthalmology

## 2018-02-06 DIAGNOSIS — H2511 Age-related nuclear cataract, right eye: Secondary | ICD-10-CM | POA: Insufficient documentation

## 2018-02-06 DIAGNOSIS — I10 Essential (primary) hypertension: Secondary | ICD-10-CM | POA: Diagnosis not present

## 2018-02-06 DIAGNOSIS — E039 Hypothyroidism, unspecified: Secondary | ICD-10-CM | POA: Diagnosis not present

## 2018-02-06 DIAGNOSIS — Z79899 Other long term (current) drug therapy: Secondary | ICD-10-CM | POA: Diagnosis not present

## 2018-02-06 DIAGNOSIS — Z8673 Personal history of transient ischemic attack (TIA), and cerebral infarction without residual deficits: Secondary | ICD-10-CM | POA: Insufficient documentation

## 2018-02-06 DIAGNOSIS — Z87891 Personal history of nicotine dependence: Secondary | ICD-10-CM | POA: Diagnosis not present

## 2018-02-06 DIAGNOSIS — Z888 Allergy status to other drugs, medicaments and biological substances status: Secondary | ICD-10-CM | POA: Diagnosis not present

## 2018-02-06 DIAGNOSIS — H25811 Combined forms of age-related cataract, right eye: Secondary | ICD-10-CM | POA: Diagnosis not present

## 2018-02-06 DIAGNOSIS — Z85828 Personal history of other malignant neoplasm of skin: Secondary | ICD-10-CM | POA: Insufficient documentation

## 2018-02-06 DIAGNOSIS — I4891 Unspecified atrial fibrillation: Secondary | ICD-10-CM | POA: Diagnosis not present

## 2018-02-06 DIAGNOSIS — I2510 Atherosclerotic heart disease of native coronary artery without angina pectoris: Secondary | ICD-10-CM | POA: Diagnosis not present

## 2018-02-06 DIAGNOSIS — K219 Gastro-esophageal reflux disease without esophagitis: Secondary | ICD-10-CM | POA: Insufficient documentation

## 2018-02-06 DIAGNOSIS — E7800 Pure hypercholesterolemia, unspecified: Secondary | ICD-10-CM | POA: Insufficient documentation

## 2018-02-06 DIAGNOSIS — Z881 Allergy status to other antibiotic agents status: Secondary | ICD-10-CM | POA: Insufficient documentation

## 2018-02-06 DIAGNOSIS — Z7901 Long term (current) use of anticoagulants: Secondary | ICD-10-CM | POA: Insufficient documentation

## 2018-02-06 HISTORY — PX: CATARACT EXTRACTION W/PHACO: SHX586

## 2018-02-06 HISTORY — DX: Presence of dental prosthetic device (complete) (partial): Z97.2

## 2018-02-06 SURGERY — PHACOEMULSIFICATION, CATARACT, WITH IOL INSERTION
Anesthesia: Monitor Anesthesia Care | Site: Eye | Laterality: Right | Wound class: Clean

## 2018-02-06 MED ORDER — MOXIFLOXACIN HCL 0.5 % OP SOLN
1.0000 [drp] | OPHTHALMIC | Status: DC | PRN
  Administered 2018-02-06 (×3): 1 [drp] via OPHTHALMIC

## 2018-02-06 MED ORDER — ACETAMINOPHEN 325 MG PO TABS: 325.0000 mg | ORAL_TABLET | Freq: Once | ORAL | Status: DC

## 2018-02-06 MED ORDER — ARMC OPHTHALMIC DILATING DROPS
1.0000 "application " | OPHTHALMIC | Status: DC | PRN
  Administered 2018-02-06 (×3): 1 via OPHTHALMIC

## 2018-02-06 MED ORDER — LIDOCAINE HCL (PF) 2 % IJ SOLN
INTRAOCULAR | Status: DC | PRN
  Administered 2018-02-06: 1 mL

## 2018-02-06 MED ORDER — MIDAZOLAM HCL 2 MG/2ML IJ SOLN
INTRAMUSCULAR | Status: DC | PRN
  Administered 2018-02-06 (×2): 1 mg via INTRAVENOUS

## 2018-02-06 MED ORDER — LACTATED RINGERS IV SOLN: INTRAVENOUS | Status: DC

## 2018-02-06 MED ORDER — NA HYALUR & NA CHOND-NA HYALUR 0.4-0.35 ML IO KIT
PACK | INTRAOCULAR | Status: DC | PRN
  Administered 2018-02-06: 1 mL via INTRAOCULAR

## 2018-02-06 MED ORDER — EPINEPHRINE PF 1 MG/ML IJ SOLN
INTRAOCULAR | Status: DC | PRN
  Administered 2018-02-06: 12:00:00 via OPHTHALMIC

## 2018-02-06 MED ORDER — CEFUROXIME OPHTHALMIC INJECTION 1 MG/0.1 ML
INJECTION | OPHTHALMIC | Status: DC | PRN
  Administered 2018-02-06: 0.1 mL via OPHTHALMIC

## 2018-02-06 MED ORDER — FENTANYL CITRATE (PF) 100 MCG/2ML IJ SOLN
INTRAMUSCULAR | Status: DC | PRN
  Administered 2018-02-06 (×2): 50 ug via INTRAVENOUS

## 2018-02-06 MED ORDER — ACETAMINOPHEN 160 MG/5ML PO SOLN: 325.0000 mg | Freq: Once | ORAL | Status: DC

## 2018-02-06 MED ORDER — BRIMONIDINE TARTRATE-TIMOLOL 0.2-0.5 % OP SOLN
OPHTHALMIC | Status: DC | PRN
  Administered 2018-02-06: 1 [drp] via OPHTHALMIC

## 2018-02-06 SURGICAL SUPPLY — 27 items
CANNULA ANT/CHMB 27G (MISCELLANEOUS) ×1 IMPLANT
CANNULA ANT/CHMB 27GA (MISCELLANEOUS) ×3 IMPLANT
CARTRIDGE ABBOTT (MISCELLANEOUS) IMPLANT
GLOVE SURG LX 7.5 STRW (GLOVE) ×2
GLOVE SURG LX STRL 7.5 STRW (GLOVE) ×1 IMPLANT
GLOVE SURG TRIUMPH 8.0 PF LTX (GLOVE) ×3 IMPLANT
GOWN STRL REUS W/ TWL LRG LVL3 (GOWN DISPOSABLE) ×2 IMPLANT
GOWN STRL REUS W/TWL LRG LVL3 (GOWN DISPOSABLE) ×6
LENS IOL TECNIS ITEC 19.5 (Intraocular Lens) ×2 IMPLANT
MARKER SKIN DUAL TIP RULER LAB (MISCELLANEOUS) ×3 IMPLANT
NDL FILTER BLUNT 18X1 1/2 (NEEDLE) ×1 IMPLANT
NDL RETROBULBAR .5 NSTRL (NEEDLE) IMPLANT
NEEDLE FILTER BLUNT 18X 1/2SAF (NEEDLE) ×2
NEEDLE FILTER BLUNT 18X1 1/2 (NEEDLE) ×1 IMPLANT
PACK CATARACT BRASINGTON (MISCELLANEOUS) ×3 IMPLANT
PACK EYE AFTER SURG (MISCELLANEOUS) ×3 IMPLANT
PACK OPTHALMIC (MISCELLANEOUS) ×3 IMPLANT
RING MALYGIN 7.0 (MISCELLANEOUS) IMPLANT
SUT ETHILON 10-0 CS-B-6CS-B-6 (SUTURE)
SUT VICRYL  9 0 (SUTURE)
SUT VICRYL 9 0 (SUTURE) IMPLANT
SUTURE EHLN 10-0 CS-B-6CS-B-6 (SUTURE) IMPLANT
SYR 3ML LL SCALE MARK (SYRINGE) ×3 IMPLANT
SYR 5ML LL (SYRINGE) ×3 IMPLANT
SYR TB 1ML LUER SLIP (SYRINGE) ×3 IMPLANT
WATER STERILE IRR 500ML POUR (IV SOLUTION) ×3 IMPLANT
WIPE NON LINTING 3.25X3.25 (MISCELLANEOUS) ×3 IMPLANT

## 2018-02-06 NOTE — Anesthesia Procedure Notes (Signed)
Procedure Name: MAC Date/Time: 02/06/2018 11:26 AM Performed by: Lind Guest, CRNA Pre-anesthesia Checklist: Patient identified, Emergency Drugs available, Suction available, Patient being monitored and Timeout performed Patient Re-evaluated:Patient Re-evaluated prior to induction Oxygen Delivery Method: Nasal cannula

## 2018-02-06 NOTE — Anesthesia Postprocedure Evaluation (Signed)
Anesthesia Post Note  Patient: Kara Wright  Procedure(s) Performed: CATARACT EXTRACTION PHACO AND INTRAOCULAR LENS PLACEMENT ([place]) RIGHT (Right Eye)  Patient location during evaluation: PACU Anesthesia Type: MAC Level of consciousness: awake and alert and oriented Pain management: satisfactory to patient Vital Signs Assessment: post-procedure vital signs reviewed and stable Respiratory status: spontaneous breathing, nonlabored ventilation and respiratory function stable Cardiovascular status: blood pressure returned to baseline and stable Postop Assessment: Adequate PO intake and No signs of nausea or vomiting Anesthetic complications: no    Raliegh Ip

## 2018-02-06 NOTE — Anesthesia Preprocedure Evaluation (Signed)
Anesthesia Evaluation  Patient identified by MRN, date of birth, ID band Patient awake    Reviewed: Allergy & Precautions, H&P , NPO status , Patient's Chart, lab work & pertinent test results  Airway Mallampati: II  TM Distance: >3 FB Neck ROM: full    Dental no notable dental hx. (+) Partial Upper   Pulmonary former smoker,    Pulmonary exam normal breath sounds clear to auscultation       Cardiovascular hypertension, + CAD  Normal cardiovascular exam+ dysrhythmias Atrial Fibrillation  Rhythm:regular Rate:Normal     Neuro/Psych TIACVA    GI/Hepatic GERD  ,  Endo/Other  Hypothyroidism   Renal/GU      Musculoskeletal   Abdominal   Peds  Hematology   Anesthesia Other Findings   Reproductive/Obstetrics                             Anesthesia Physical Anesthesia Plan  ASA: III  Anesthesia Plan: MAC   Post-op Pain Management:    Induction:   PONV Risk Score and Plan: 2 and Treatment may vary due to age or medical condition and Midazolam  Airway Management Planned:   Additional Equipment:   Intra-op Plan:   Post-operative Plan:   Informed Consent: I have reviewed the patients History and Physical, chart, labs and discussed the procedure including the risks, benefits and alternatives for the proposed anesthesia with the patient or authorized representative who has indicated his/her understanding and acceptance.     Plan Discussed with: CRNA  Anesthesia Plan Comments:         Anesthesia Quick Evaluation

## 2018-02-06 NOTE — Transfer of Care (Signed)
Immediate Anesthesia Transfer of Care Note  Patient: Kara Wright  Procedure(s) Performed: CATARACT EXTRACTION PHACO AND INTRAOCULAR LENS PLACEMENT ([facility]) RIGHT (Right Eye)  Patient Location: PACU  Anesthesia Type: MAC  Level of Consciousness: awake, alert  and patient cooperative  Airway and Oxygen Therapy: Patient Spontanous Breathing and Patient connected to supplemental oxygen  Post-op Assessment: Post-op Vital signs reviewed, Patient's Cardiovascular Status Stable, Respiratory Function Stable, Patent Airway and No signs of Nausea or vomiting  Post-op Vital Signs: Reviewed and stable  Complications: No apparent anesthesia complications

## 2018-02-06 NOTE — H&P (Signed)
The History and Physical notes are on paper, have been signed, and are to be scanned. The patient remains stable and unchanged from the H&P.   Previous H&P reviewed, patient examined, and there are no changes.  Kara Wright 02/06/2018 10:41 AM

## 2018-02-06 NOTE — Op Note (Signed)
LOCATION:  [facility]   PREOPERATIVE DIAGNOSIS:    Nuclear sclerotic cataract right eye. H25.11   POSTOPERATIVE DIAGNOSIS:  Nuclear sclerotic cataract right eye.     PROCEDURE:  Phacoemusification with posterior chamber intraocular lens placement of the right eye   LENS:   Implant Name Type Inv. Item Serial No. Manufacturer Lot No. LRB No. Used  LENS IOL DIOP 19.5 - [id number] Intraocular Lens LENS IOL DIOP 19.5 [id number]  Right 1        ULTRASOUND TIME: 17 % of 0 minutes, 38 seconds.  CDE 6.8   SURGEON:  Wyonia Hough, MD   ANESTHESIA:  Topical with tetracaine drops and 2% Xylocaine jelly, augmented with 1% preservative-free intracameral lidocaine.    COMPLICATIONS:  None.   DESCRIPTION OF PROCEDURE:  The patient was identified in the holding room and transported to the operating room and placed in the supine position under the operating microscope.  The right eye was identified as the operative eye and it was prepped and draped in the usual sterile ophthalmic fashion.   A 1 millimeter clear-corneal paracentesis was made at the 12:00 position.  0.5 ml of preservative-free 1% lidocaine was injected into the anterior chamber. The anterior chamber was filled with Viscoat viscoelastic.  A 2.4 millimeter keratome was used to make a near-clear corneal incision at the 9:00 position.  A curvilinear capsulorrhexis was made with a cystotome and capsulorrhexis forceps.  Balanced salt solution was used to hydrodissect and hydrodelineate the nucleus.   Phacoemulsification was then used in stop and chop fashion to remove the lens nucleus and epinucleus.  The remaining cortex was then removed using the irrigation and aspiration handpiece. Provisc was then placed into the capsular bag to distend it for lens placement.  A lens was then injected into the capsular bag.  The remaining viscoelastic was aspirated.   Wounds were hydrated with balanced salt solution.  The anterior  chamber was inflated to a physiologic pressure with balanced salt solution.  No wound leaks were noted. Cefuroxime 0.1 ml of a 10mg /ml solution was injected into the anterior chamber for a dose of 1 mg of intracameral antibiotic at the completion of the case.   Timolol and Brimonidine drops were applied to the eye.  The patient was taken to the recovery room in stable condition without complications of anesthesia or surgery.   Kara Wright 02/06/2018, 11:37 AM

## 2018-02-07 ENCOUNTER — Encounter: Payer: Self-pay | Admitting: Ophthalmology

## 2018-02-20 ENCOUNTER — Other Ambulatory Visit: Payer: Self-pay | Admitting: Cardiovascular Disease

## 2018-02-20 ENCOUNTER — Telehealth: Payer: Self-pay | Admitting: Cardiovascular Disease

## 2018-02-20 MED ORDER — PROPRANOLOL HCL ER 120 MG PO CP24: 120.0000 mg | ORAL_CAPSULE | Freq: Every day | ORAL | 0 refills | Status: DC

## 2018-02-20 NOTE — Telephone Encounter (Signed)
Propranolol 120 mg has been refilled with 90 tablets and no refills. The patient is due for a follow up in October.

## 2018-02-20 NOTE — Telephone Encounter (Signed)
Kara Wright states they need a 90 day rx for Propranolol

## 2018-03-18 ENCOUNTER — Other Ambulatory Visit: Payer: Self-pay | Admitting: Family Medicine

## 2018-03-19 NOTE — Telephone Encounter (Signed)
Name of Medication: Tramadol Name of Pharmacy: [facility] [place] or Written Date and Quantity: 2/25/149 #30/1 Last Office Visit and Type: 11/09/17/acute Next Office Visit and Type: 08/12/18 Medicare Wellness Last Controlled Substance Agreement Date: None Last UDS: none

## 2018-03-20 NOTE — Telephone Encounter (Signed)
Sent. Thanks.   

## 2018-04-18 ENCOUNTER — Ambulatory Visit (INDEPENDENT_AMBULATORY_CARE_PROVIDER_SITE_OTHER): Payer: PPO

## 2018-04-18 DIAGNOSIS — K1379 Other lesions of oral mucosa: Secondary | ICD-10-CM | POA: Diagnosis not present

## 2018-04-18 DIAGNOSIS — Z23 Encounter for immunization: Secondary | ICD-10-CM | POA: Diagnosis not present

## 2018-04-25 DIAGNOSIS — L821 Other seborrheic keratosis: Secondary | ICD-10-CM | POA: Diagnosis not present

## 2018-04-25 DIAGNOSIS — L239 Allergic contact dermatitis, unspecified cause: Secondary | ICD-10-CM | POA: Diagnosis not present

## 2018-04-25 DIAGNOSIS — Z08 Encounter for follow-up examination after completed treatment for malignant neoplasm: Secondary | ICD-10-CM | POA: Diagnosis not present

## 2018-04-25 DIAGNOSIS — Z85828 Personal history of other malignant neoplasm of skin: Secondary | ICD-10-CM | POA: Diagnosis not present

## 2018-05-05 NOTE — Progress Notes (Signed)
Cardiology Office Note  Date:  05/06/2018   ID:  Kara Wright, Kara Wright 03/16/1946, MRN [id number]  PCP:  Kara Ghent, MD   Chief Complaint  Patient presents with  . OTHER    OD 12 month f/u no complaints. Meds reviewed verbally with pt.    HPI:  Kara Wright is a very pleasant 72 year-old woman with  morbid obesity,  hypothyroidism,  remote smoking history for less than 10 years,  prior TIA,CVA.  atrial fibrillation seen on Holter monitor in 2013 She presents today for follow-up of her paroxysmal atrial fibrillation  In follow-up today she reports that she feels well   One episode of atrial fibrillation April 03, 2018 2 hour spell on beach trip Took diltiazem x 3, propranolol 20 x1,  Finally went back to normal sinus rhythm Typically episodes of atrial fibrillation will last 20 to 30 minutes, this was the longest Prior episode before that was 3 to 4 months earlier  No regular exercise program Denies any shortness of breath or chest pain No TIA or stroke symptoms Difficulty affording her anticoagulation, in the donut hole  EKG personally reviewed by myself on todays visit Shows normal sinus rhythm rate 62 bpm no significant ST or T wave changes  Other past medical history reviewed Hospitalization May 2018 Pneumonia with COPD exacerbation Treated with antibiotics and steroids -CT high-resolution without ILD findings  -Echocardiogram with normal EF and diastolic function  -D-dimer negative, low suspicion for PE  -Possible component of COPD, although no previous diagnosis.    February 22nd 2016 she had 30 minutes of tachycardia. She is concerned this could have been atrial fibrillation   08/25/2011,  right eye vision changes, right arm weakness and numbness lasting for 30-40 minutes. Symptoms seemed to resolve. The next day, on Saturday, she had some mouth and facial numbness and she went to the emergency room. She also had headache. They did a EKG and CT scan of  the head that showed no stroke. Early in the week, she continued to have headache and mouth issues and she was taken to [facility]. She had MRA that showed small vessel disease, carotid ultrasound that showed mild plaque. She reports that she was in the hospital for 3 days. No atrial fibrillation was seen.   Echocardiogram was essentially normal. Prior episodes of tachycardia lasting less than 1 minute on her last clinic visit   Previous Holter monitor that showed short periods of supraventricular tachycardia, rare APCs, long stretches of atrial fibrillation. She did not have any symptoms while wearing the monitor  PMH:   has a past medical history of Atrial fib/flutter, transient, Cataract (2019), Clotting disorder ([place]), Diverticulosis (03/1997), Dysrhythmia, GERD (gastroesophageal reflux disease) (1997), Hepatitis, Hyperlipidemia (08/1996), Hypothyroidism (1984), Migraine, Osteopenia (11/2002), Presence of dental prosthetic device, Skin cancer, and Stroke ([place]) (08/2011).  PSH:    Past Surgical History:  Procedure Laterality Date  . ABDOMINAL HYSTERECTOMY    . ANAL FISSURE REPAIR    . CATARACT EXTRACTION W/PHACO Right 02/06/2018   Procedure: CATARACT EXTRACTION PHACO AND INTRAOCULAR LENS PLACEMENT ([place]) RIGHT;  Surgeon: Leandrew Koyanagi, MD;  Location: [facility];  Service: Ophthalmology;  Laterality: Right;  . CHOLECYSTECTOMY  07/2006  . 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 nasolabial fold skin flap surgery  08/02/2007   [facility]  . PARTIAL HYSTERECTOMY  1978-79  . POLYPECTOMY  04/06/97   colon, benign by pathology  . varicose vein ablation  sclerotherapy  09/2008   Dr. Hulda Humphrey    Current Outpatient Medications  Medication Sig Dispense Refill  . albuterol (PROVENTIL HFA;VENTOLIN HFA) 108 (90 Base) MCG/ACT inhaler Inhale 1-2 puffs into the lungs every 6 (six) hours as needed for wheezing or shortness of breath. 1 Inhaler 0  . Cholecalciferol  (VITAMIN D3 PO) Take by mouth daily.    Marland Kitchen diltiazem (CARDIZEM) 30 MG tablet Take 1 tablet (30 mg total) by mouth 3 (three) times daily as needed. 90 tablet 1  . diphenhydrAMINE (BENADRYL) 25 MG tablet Take 25 mg by mouth every 8 (eight) hours as needed for itching.    . levothyroxine (SYNTHROID, LEVOTHROID) 150 MCG tablet Take 1 tablet (150 mcg total) by mouth daily. Except for 1.5 on Sunday 100 tablet 3  . omeprazole (PRILOSEC) 20 MG capsule Take 1 capsule (20 mg total) by mouth daily. 90 capsule 3  . promethazine (PHENERGAN) 25 MG tablet TAKE ONE TABLET BY MOUTH EVERY 6 HOURS AS NEEDED FOR NAUSEA 30 tablet 3  . propranolol ER (INDERAL LA) 120 MG 24 hr capsule Take 1 capsule (120 mg total) by mouth daily. 90 capsule 0  . rivaroxaban (XARELTO) 20 MG TABS tablet TAKE 1 TABLET BY MOUTH ONCE DAILY WITH  SUPPER 90 tablet 3  . traMADol (ULTRAM) 50 MG tablet TAKE 1 TABLET BY MOUTH EVERY 8 HOURS AS NEEDED...CAUTION SEDATION 30 tablet 1  . rosuvastatin (CRESTOR) 5 MG tablet Take 1 tablet (5 mg total) by mouth at bedtime. 90 tablet 3   No current facility-administered medications for this visit.      Allergies:   Celebrex [celecoxib]; Doxycycline; Gold-containing drug products; Nickel; Other; and Zocor [simvastatin]   Social History:  The patient  reports that she quit smoking about 31 years ago. She has never used smokeless tobacco. She reports that she does not drink alcohol or use drugs.   Family History:   family history includes Allergies in her sister; Breast cancer in her cousin; Colon cancer (age of onset: 84) in her father; Hypertension in her mother; Osteopenia in her mother; Stroke in her mother.    Review of Systems: Review of Systems  Constitutional: Negative.   Respiratory: Negative.   Cardiovascular: Negative.   Gastrointestinal: Negative.   Musculoskeletal: Positive for joint pain.  Neurological: Negative.   Psychiatric/Behavioral: Negative.   All other systems reviewed and are  negative.    PHYSICAL EXAM: VS:  BP 140/80 (BP Location: Left Arm, Patient Position: Sitting, Cuff Size: Large)   Pulse 62   Ht 5' 4.5" (1.638 m)   Wt 227 lb 12 oz (103.3 kg)   BMI 38.49 kg/m  , BMI Body mass index is 38.49 kg/m.  Constitutional:  oriented to person, place, and time. No distress.  Obese HENT:  Head: Grossly normal Eyes:  no discharge. No scleral icterus.  Neck: No JVD, no carotid bruits  Cardiovascular: Regular rate and rhythm, no murmurs appreciated Pulmonary/Chest: Clear to auscultation bilaterally, no wheezes or rails Abdominal: Soft.  no distension.  no tenderness.  Musculoskeletal: Normal range of motion Neurological:  normal muscle tone. Coordination normal. No atrophy Skin: Skin warm and dry Psychiatric: normal affect, pleasant  Recent Labs: 08/06/2017: ALT 13; BUN 9; Creatinine, Ser 0.69; Hemoglobin 13.6; Platelets 163.0; Potassium 3.9; Sodium 141; TSH 4.44    Lipid Panel Lab Results  Component Value Date   CHOL 114 08/06/2017   HDL 39.90 08/06/2017   LDLCALC 47 08/06/2017   TRIG 137.0 08/06/2017    Wt  Readings from Last 3 Encounters:  05/06/18 227 lb 12 oz (103.3 kg)  02/06/18 226 lb (102.5 kg)  11/09/17 226 lb (102.5 kg)      ASSESSMENT AND PLAN:  Paroxysmal atrial fibrillation ([place]) She will continue to use diltiazem and propranolol as needed for atrial fibrillation episodes We did discuss starting antiarrhythmic if she has more frequent episodes or if they continue to last for several hours at a time No medication changes for now Discussed anticoagulation, no further options at this time, does not want warfarin Cost an issue, samples provided  Pure hypercholesterolemia Cholesterol at goal No changes to her medications  Essential hypertension Blood pressure is well controlled on today's visit. No changes made to the medications.  Morbid obesity ([place]) No regular exercise program We spent some time discussing low carbohydrate  diet, walking program She continues to work part-time job   Total encounter time more than 25 minutes  Greater than 50% was spent in counseling and coordination of care with the patient   Disposition:   F/U  12 months   Orders Placed This Encounter  Procedures  . EKG 12-Lead     Signed, Esmond Plants, M.D., Ph.D. 05/06/2018  [place], [place]

## 2018-05-06 ENCOUNTER — Ambulatory Visit (INDEPENDENT_AMBULATORY_CARE_PROVIDER_SITE_OTHER): Payer: PPO | Admitting: Cardiovascular Disease

## 2018-05-06 ENCOUNTER — Encounter: Payer: Self-pay | Admitting: Cardiovascular Disease

## 2018-05-06 VITALS — BP 140/80 | HR 62 | Ht 64.5 in | Wt 227.8 lb

## 2018-05-06 DIAGNOSIS — J9601 Acute respiratory failure with hypoxia: Secondary | ICD-10-CM | POA: Diagnosis not present

## 2018-05-06 DIAGNOSIS — I25118 Atherosclerotic heart disease of native coronary artery with other forms of angina pectoris: Secondary | ICD-10-CM | POA: Diagnosis not present

## 2018-05-06 DIAGNOSIS — I480 Paroxysmal atrial fibrillation: Secondary | ICD-10-CM

## 2018-05-06 DIAGNOSIS — I10 Essential (primary) hypertension: Secondary | ICD-10-CM

## 2018-05-06 DIAGNOSIS — E7800 Pure hypercholesterolemia, unspecified: Secondary | ICD-10-CM | POA: Diagnosis not present

## 2018-05-06 NOTE — Patient Instructions (Addendum)
Xarelto samples Medication Samples have been provided to the patient.  Drug name: Xarelto     Strength: 20 mg        Qty: 3 bottles  LOT: [id number]  Exp.Date: 03/21   Medication Instructions:  No changes  If you need a refill on your cardiac medications before your next appointment, please call your pharmacy.   Lab work: No new labs needed   If you have labs (blood work) drawn today and your tests are completely normal, you will receive your results only by: Marland Kitchen MyChart Message (if you have MyChart) OR . A paper copy in the mail If you have any lab test that is abnormal or we need to change your treatment, we will call you to review the results.   Testing/Procedures: No new testing needed   Follow-Up: At [facility], you and your health needs are our priority.  As part of our continuing mission to provide you with exceptional heart care, we have created designated Provider Care Teams.  These Care Teams include your primary Cardiologist (physician) and Advanced Practice Providers (APPs -  Physician Assistants and Nurse Practitioners) who all work together to provide you with the care you need, when you need it.  . You will need a follow up appointment in 12 months .   Please call our office 2 months in advance to schedule this appointment.    . Providers on your designated Care Team:   . Murray Hodgkins, NP . Christell Faith, PA-C . Marrianne Mood, PA-C  Any Other Special Instructions Will Be Listed Below (If Applicable).  For educational health videos Log in to : [email or web address] Or : [email or web address], password : [email or web address]

## 2018-05-28 ENCOUNTER — Other Ambulatory Visit: Payer: Self-pay | Admitting: Cardiovascular Disease

## 2018-07-10 ENCOUNTER — Other Ambulatory Visit: Payer: Self-pay | Admitting: Cardiovascular Disease

## 2018-07-15 DIAGNOSIS — L308 Other specified dermatitis: Secondary | ICD-10-CM | POA: Diagnosis not present

## 2018-07-19 DIAGNOSIS — L235 Allergic contact dermatitis due to other chemical products: Secondary | ICD-10-CM | POA: Diagnosis not present

## 2018-07-22 ENCOUNTER — Telehealth: Payer: Self-pay | Admitting: Family Medicine

## 2018-07-22 DIAGNOSIS — R911 Solitary pulmonary nodule: Secondary | ICD-10-CM

## 2018-07-22 NOTE — Telephone Encounter (Signed)
Due for follow-up chest CT regarding pulmonary nodules.  Ordered.  Thanks.

## 2018-07-22 NOTE — Telephone Encounter (Signed)
Patient advised.

## 2018-07-29 ENCOUNTER — Ambulatory Visit
Admission: RE | Admit: 2018-07-29 | Discharge: 2018-07-29 | Disposition: A | Discharge status: Home / Self Care | Payer: PPO | Source: Ambulatory Visit | Attending: Family Medicine | Admitting: Family Medicine

## 2018-07-29 DIAGNOSIS — R911 Solitary pulmonary nodule: Secondary | ICD-10-CM

## 2018-07-29 DIAGNOSIS — R918 Other nonspecific abnormal finding of lung field: Secondary | ICD-10-CM | POA: Diagnosis not present

## 2018-08-01 ENCOUNTER — Other Ambulatory Visit: Payer: Self-pay | Admitting: Family Medicine

## 2018-08-01 DIAGNOSIS — R918 Other nonspecific abnormal finding of lung field: Secondary | ICD-10-CM

## 2018-08-01 DIAGNOSIS — R911 Solitary pulmonary nodule: Secondary | ICD-10-CM

## 2018-08-05 ENCOUNTER — Institutional Professional Consult (permissible substitution): Payer: PPO | Admitting: Pulmonary Disease

## 2018-08-08 ENCOUNTER — Encounter: Payer: Self-pay | Admitting: Pulmonary Disease

## 2018-08-08 ENCOUNTER — Ambulatory Visit (INDEPENDENT_AMBULATORY_CARE_PROVIDER_SITE_OTHER): Payer: PPO

## 2018-08-08 ENCOUNTER — Ambulatory Visit (INDEPENDENT_AMBULATORY_CARE_PROVIDER_SITE_OTHER): Payer: PPO | Admitting: Pulmonary Disease

## 2018-08-08 VITALS — BP 126/78 | HR 72 | Ht 65.0 in | Wt 237.8 lb

## 2018-08-08 VITALS — BP 120/78 | HR 69 | Temp 98.2°F | Ht 64.0 in | Wt 234.2 lb

## 2018-08-08 DIAGNOSIS — Z87891 Personal history of nicotine dependence: Secondary | ICD-10-CM

## 2018-08-08 DIAGNOSIS — Z0000 Encounter for general adult medical examination without abnormal findings: Secondary | ICD-10-CM

## 2018-08-08 DIAGNOSIS — E039 Hypothyroidism, unspecified: Secondary | ICD-10-CM

## 2018-08-08 DIAGNOSIS — R739 Hyperglycemia, unspecified: Secondary | ICD-10-CM

## 2018-08-08 DIAGNOSIS — I288 Other diseases of pulmonary vessels: Secondary | ICD-10-CM

## 2018-08-08 DIAGNOSIS — R918 Other nonspecific abnormal finding of lung field: Secondary | ICD-10-CM | POA: Diagnosis not present

## 2018-08-08 DIAGNOSIS — E7849 Other hyperlipidemia: Secondary | ICD-10-CM

## 2018-08-08 DIAGNOSIS — R911 Solitary pulmonary nodule: Secondary | ICD-10-CM

## 2018-08-08 DIAGNOSIS — E559 Vitamin D deficiency, unspecified: Secondary | ICD-10-CM

## 2018-08-08 DIAGNOSIS — R0609 Other forms of dyspnea: Secondary | ICD-10-CM

## 2018-08-08 LAB — CBC WITH DIFFERENTIAL/PLATELET
BASOS ABS: 0 10*3/uL (ref 0.0–0.1)
Basophils Relative: 0.5 % (ref 0.0–3.0)
Eosinophils Absolute: 0.1 10*3/uL (ref 0.0–0.7)
Eosinophils Relative: 2.3 % (ref 0.0–5.0)
HCT: 42.5 % (ref 36.0–46.0)
Hemoglobin: 13.9 g/dL (ref 12.0–15.0)
LYMPHS PCT: 30.9 % (ref 12.0–46.0)
Lymphs Abs: 2 10*3/uL (ref 0.7–4.0)
MCHC: 32.7 g/dL (ref 30.0–36.0)
MCV: 83.4 fl (ref 78.0–100.0)
MONOS PCT: 8.7 % (ref 3.0–12.0)
Monocytes Absolute: 0.6 10*3/uL (ref 0.1–1.0)
Neutro Abs: 3.7 10*3/uL (ref 1.4–7.7)
Neutrophils Relative %: 57.6 % (ref 43.0–77.0)
Platelets: 155 10*3/uL (ref 150.0–400.0)
RBC: 5.1 Mil/uL (ref 3.87–5.11)
RDW: 14.5 % (ref 11.5–15.5)
WBC: 6.4 10*3/uL (ref 4.0–10.5)

## 2018-08-08 LAB — COMPREHENSIVE METABOLIC PANEL
ALK PHOS: 69 U/L (ref 39–117)
ALT: 13 U/L (ref 0–35)
AST: 16 U/L (ref 0–37)
Albumin: 4 g/dL (ref 3.5–5.2)
BILIRUBIN TOTAL: 0.7 mg/dL (ref 0.2–1.2)
BUN: 14 mg/dL (ref 6–23)
CO2: 35 meq/L — AB (ref 19–32)
Calcium: 9.3 mg/dL (ref 8.4–10.5)
Chloride: 103 mEq/L (ref 96–112)
Creatinine, Ser: 0.73 mg/dL (ref 0.40–1.20)
GFR: 78.36 mL/min (ref >60.00)
Glucose, Bld: 83 mg/dL (ref 70–99)
Potassium: 4.2 mEq/L (ref 3.5–5.1)
Sodium: 142 mEq/L (ref 135–145)
TOTAL PROTEIN: 6.9 g/dL (ref 6.0–8.3)

## 2018-08-08 LAB — VITAMIN D 25 HYDROXY (VIT D DEFICIENCY, FRACTURES): VITD: 30.89 ng/mL (ref 30.00–100.00)

## 2018-08-08 LAB — LIPID PANEL
CHOL/HDL RATIO: 3
Cholesterol: 124 mg/dL (ref 0–200)
HDL: 46.9 mg/dL (ref >39.00)
LDL Cholesterol: 48 mg/dL (ref 0–99)
NONHDL: 77.52
TRIGLYCERIDES: 146 mg/dL (ref 0.0–149.0)
VLDL: 29.2 mg/dL (ref 0.0–40.0)

## 2018-08-08 LAB — HEMOGLOBIN A1C: Hgb A1c MFr Bld: 5.4 % (ref 4.6–6.5)

## 2018-08-08 LAB — TSH: TSH: 1.74 u[IU]/mL (ref 0.35–4.50)

## 2018-08-08 NOTE — Progress Notes (Signed)
[facility] labs drawn by Suzzanne Cloud today per Dr. Valeta Harms.  Sent out to [facility] at [place], [place] [address] via prepaid FedEx package.

## 2018-08-08 NOTE — Patient Instructions (Addendum)
Thank you for visiting Dr. Valeta Harms at [facility]. Today we recommend the following: Orders Placed This Encounter  Procedures  . CT Chest Wo Contrast  . ECHOCARDIOGRAM COMPLETE  . Pulmonary Function Test   Today we will also send blood work for Nodify lung nodule blood screening test.  Return in about 3 months (around 11/06/2018). to see me.  Please set patient up with Dr. Ander Slade for next available sleep consultation.

## 2018-08-08 NOTE — Progress Notes (Signed)
Subjective:   Kara Wright is a 73 y.o. female who presents for Medicare Annual (Subsequent) preventive examination.  Review of Systems:  N/A Cardiac Risk Factors include: advanced age (>63men, >44 women);obesity (BMI >30kg/m2);dyslipidemia;hypertension     Objective:     Vitals: BP 120/78 (BP Location: Right Arm, Patient Position: Sitting, Cuff Size: Large)   Pulse 69   Temp 98.2 F (36.8 C) (Oral)   Ht 5\' 4"  (1.626 m) Comment: no shoes  Wt 234 lb 3.2 oz (106.2 kg)   SpO2 95%   BMI 40.20 kg/m   Body mass index is 40.2 kg/m.  Advanced Directives 08/08/2018 02/06/2018 10/29/2017 08/06/2017 10/26/2016 10/21/2016 08/03/2016  Does Patient Have a Medical Advance Directive? No No No No No No No  Would patient like information on creating a medical advance directive? No - Patient declined - No - Patient declined No - Patient declined No - Patient declined - -  Pre-existing out of facility DNR order (yellow form or pink MOST form) - - - - - - -    Tobacco Social History   Tobacco Use  Smoking Status Former Smoker  . Packs/day: 1.00  . Last attempt to quit: 06/26/1986  . Years since quitting: 32.1  Smokeless Tobacco Never Used     Counseling given: No   Clinical Intake:  Pre-visit preparation completed: Yes  Pain : No/denies pain Pain Score: 0-No pain     Nutritional Status: BMI > 30  Obese Nutritional Risks: None Diabetes: No  How often do you need to have someone help you when you read instructions, pamphlets, or other written materials from your doctor or pharmacy?: 1 - Never What is the last grade level you completed in school?: 12th grade + 2 yrs college  Interpreter Needed?: No  Comments: pt lives with spouse Information entered by :: LPinson, LPN  Past Medical History:  Diagnosis Date  . Atrial fib/flutter, transient    1 episode, resolved, never on coumadin  . Cataract 2019   left eye  . Clotting disorder ([place])    on xarelto  . Diverticulosis 03/1997   . Dysrhythmia    atrial fibrilation  . GERD (gastroesophageal reflux disease) 1997   good on prilosec  . Hepatitis    hx of hep B (late 1970s)  . Hyperlipidemia 08/1996  . Hypothyroidism 1984  . Migraine    w/o aura  . Osteopenia 11/2002   via dexa  . Presence of dental prosthetic device    temporary while waiting for implant  . Skin cancer    skin cancer  . Stroke [facility]) 08/2011   TIA   Past Surgical History:  Procedure Laterality Date  . ABDOMINAL HYSTERECTOMY    . ANAL FISSURE REPAIR    . CATARACT EXTRACTION W/PHACO Right 02/06/2018   Procedure: CATARACT EXTRACTION PHACO AND INTRAOCULAR LENS PLACEMENT ([place]) RIGHT;  Surgeon: Leandrew Koyanagi, MD;  Location: [facility];  Service: Ophthalmology;  Laterality: Right;  . CHOLECYSTECTOMY  07/2006  . 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 nasolabial fold skin flap surgery  08/02/2007   [facility]  . PARTIAL HYSTERECTOMY  1978-79  . POLYPECTOMY  04/06/97   colon, benign by pathology  . varicose vein ablation sclerotherapy  09/2008   Dr. Hulda Humphrey   Family History  Problem Relation Age of Onset  . Hypertension Mother   . Osteopenia Mother   . Stroke Mother  deceased age 6  . Colon cancer Father 60       mets  . Allergies Sister   . Breast cancer Cousin        pat cousin  . Rectal cancer Neg Hx    Social History   Socioeconomic History  . Marital status: Married    Spouse name: Not on file  . Number of children: 2  . Years of education: Not on file  . Highest education level: Not on file  Occupational History  . Occupation: Ecologist: RETIRED    Comment: [employer]  . 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 Financial resource strain: Not on file  . Food insecurity:    Worry: Not on file    Inability: Not on file  . Transportation needs:    Medical: Not on file    Non-medical: Not on file  Tobacco Use    . Smoking status: Former Smoker    Packs/day: 1.00    Last attempt to quit: 06/26/1986    Years since quitting: 32.1  . Smokeless tobacco: Never Used  Substance and Sexual Activity  . Alcohol use: No  . Drug use: No  . Sexual activity: Not Currently  Lifestyle  . Physical activity:    Days per week: Not on file    Minutes per session: Not on file  . Stress: Not on file  Relationships  . Social connections:    Talks on phone: Not on file    Gets together: Not on file    Attends religious service: Not on file    Active member of club or organization: Not on file    Attends meetings of clubs or organizations: Not on file    Relationship status: Not on file  Other Topics Concern  . Not on file  Social History Narrative   Works part time at [employer] office   Married 1979   2 daughters from prev relationship   [employer] fan, [employer] fan.    Outpatient Encounter Medications as of 08/08/2018  Medication Sig  . albuterol (PROVENTIL HFA;VENTOLIN HFA) 108 (90 Base) MCG/ACT inhaler Inhale 1-2 puffs into the lungs every 6 (six) hours as needed for wheezing or shortness of breath.  . Cholecalciferol (VITAMIN D3 PO) Take by mouth daily.  Marland Kitchen diltiazem (CARDIZEM) 30 MG tablet Take 1 tablet (30 mg total) by mouth 3 (three) times daily as needed.  . diphenhydrAMINE (BENADRYL) 25 MG tablet Take 25 mg by mouth every 8 (eight) hours as needed for itching.  . levothyroxine (SYNTHROID, LEVOTHROID) 150 MCG tablet Take 1 tablet (150 mcg total) by mouth daily. Except for 1.5 on Sunday  . omeprazole (PRILOSEC) 20 MG capsule Take 1 capsule (20 mg total) by mouth daily.  . promethazine (PHENERGAN) 25 MG tablet TAKE ONE TABLET BY MOUTH EVERY 6 HOURS AS NEEDED FOR NAUSEA  . propranolol ER (INDERAL LA) 120 MG 24 hr capsule Take 1 capsule by mouth daily  . rivaroxaban (XARELTO) 20 MG TABS tablet TAKE 1 TABLET BY MOUTH ONCE DAILY WITH  SUPPER  . rosuvastatin (CRESTOR) 5 MG tablet Take 1 tablet by mouth at  bedtime  . traMADol (ULTRAM) 50 MG tablet TAKE 1 TABLET BY MOUTH EVERY 8 HOURS AS NEEDED...CAUTION SEDATION   No facility-administered encounter medications on file as of 08/08/2018.     Activities of Daily Living In your present state of health, do you  have any difficulty performing the following activities: 08/08/2018 02/06/2018  Hearing? N N  Vision? N N  Difficulty concentrating or making decisions? N N  Walking or climbing stairs? N N  Dressing or bathing? N N  Doing errands, shopping? N -  Preparing Food and eating ? N -  Using the Toilet? N -  In the past six months, have you accidently leaked urine? N -  Do you have problems with loss of bowel control? N -  Managing your Medications? N -  Managing your Finances? N -  Housekeeping or managing your Housekeeping? N -  Some recent data might be hidden    Patient Care Team: Tonia Ghent, MD as PCP - General (Family Medicine) Dasher, Rayvon Char, MD as Consulting Physician (Dermatology) Minna Merritts, MD as Consulting Physician (Cardiology) Ocie Doyne, OD as Consulting Physician (Optometry) Dale [place]dale, DDS as Consulting Physician (Dentistry)    Assessment:   This is a routine wellness examination for [place].  Exercise Activities and Dietary recommendations Exercise limited by: None identified  Goals    . Increase physical activity     Starting 08/08/2018, I will attempt to exercise for 60 minutes twice weekly and to make healthy food choices by eliminating added sugar in an effort to lose weight.        Fall Risk Fall Risk  08/08/2018 08/06/2017 08/03/2016 07/16/2012  Falls in the past year? 0 No No No   Depression Screen PHQ 2/9 Scores 08/08/2018 08/06/2017 08/03/2016 08/03/2016  PHQ - 2 Score 0 0 0 0  PHQ- 9 Score 0 0 - -     Cognitive Function MMSE - Mini Mental State Exam 08/08/2018 08/06/2017 08/03/2016  Orientation to time 5 5 5   Orientation to Place 5 5 5   Registration 3 3 3   Attention/ Calculation 0 0 0    Recall 3 3 3   Language- name 2 objects 0 0 0  Language- repeat 1 1 1   Language- follow 3 step command 3 3 3   Language- read & follow direction 0 0 0  Write a sentence 0 0 0  Copy design 0 0 0  Total score 20 20 20        PLEASE NOTE: A Mini-Cog screen was completed. Maximum score is 20. A value of 0 denotes this part of [place] MMSE was not completed or the patient failed this part of the Mini-Cog screening.   Mini-Cog Screening Orientation to Time - Max 5 pts Orientation to Place - Max 5 pts Registration - Max 3 pts Recall - Max 3 pts Language Repeat - Max 1 pts Language Follow 3 Step Command - Max 3 pts   Immunization History  Administered Date(s) Administered  . Influenza Split 04/13/2011, 04/26/2012  . Influenza Whole 04/07/2009, 04/13/2010  . Influenza, High Dose Seasonal PF 04/09/2017, 04/18/2018  . Influenza,inj,Quad PF,6+ Mos 05/01/2013, 04/30/2014, 04/05/2015, 06/05/2016  . Pneumococcal Conjugate-13 08/06/2014  . Pneumococcal Polysaccharide-23 07/16/2012  . Td 04/04/2006, 02/20/2012  . Zoster 05/02/2007    Screening Tests Health Maintenance  Topic Date Due  . MAMMOGRAM  01/01/2019  . TETANUS/TDAP  02/19/2022  . COLONOSCOPY  10/30/2022  . INFLUENZA VACCINE  Completed  . DEXA SCAN  Completed  . Hepatitis C Screening  Completed  . PNA vac Low Risk Adult  Completed      Plan:     I have personally reviewed, addressed, and noted the following in the patient's chart:  A. Medical and social history B. Use of  alcohol, tobacco or illicit drugs  C. Current medications and supplements D. Functional ability and status E.  Nutritional status F.  Physical activity G. Advance directives H. List of other physicians I.  Hospitalizations, surgeries, and ER visits in previous 12 months J.  [place] to include hearing, vision, cognitive, depression L. Referrals and appointments - none  In addition, I have reviewed and discussed with patient certain  preventive protocols, quality metrics, and best practice recommendations. A written personalized care plan for preventive services as well as general preventive health recommendations were provided to patient.  See attached scanned questionnaire for additional information.   Signed,   Lindell Noe, MHA, BS, LPN Health Coach

## 2018-08-08 NOTE — Progress Notes (Signed)
PCP notes:   Health maintenance:  No gaps identified.  Abnormal screenings:   Hearing - failed  Hearing Screening   125Hz  250Hz  500Hz  1000Hz  2000Hz  3000Hz  4000Hz  6000Hz  8000Hz   Right ear:   40 40 40  0    Left ear:   40 40 40  40     Patient concerns:   None  Nurse concerns:  None  Next PCP appt:   08/15/18 @ 1045  I reviewed health advisor's note, was available for consultation on the day of service listed in this note, and agree with documentation and plan. Elsie Stain, MD.

## 2018-08-08 NOTE — Patient Instructions (Signed)
Kara Wright , Thank you for taking time to come for your Medicare Wellness Visit. I appreciate your ongoing commitment to your health goals. Please review the following plan we discussed and let me know if I can assist you in the future.   These are the goals we discussed: Goals    . Increase physical activity     Starting 08/08/2018, I will attempt to exercise for 60 minutes twice weekly and to make healthy food choices by eliminating added sugar in an effort to lose weight.        This is a list of the screening recommended for you and due dates:  Health Maintenance  Topic Date Due  . Mammogram  01/01/2019  . Tetanus Vaccine  02/19/2022  . Colon Cancer Screening  10/30/2022  . Flu Shot  Completed  . DEXA scan (bone density measurement)  Completed  .  Hepatitis C: One time screening is recommended by Center for Disease Control  (CDC) for  adults born from [birth year] through 1964.   Completed  . Pneumonia vaccines  Completed   Preventive Care for Adults  A healthy lifestyle and preventive care can promote health and wellness. Preventive health guidelines for adults include the following key practices.  . A routine yearly physical is a good way to check with your health care provider about your health and preventive screening. It is a chance to share any concerns and updates on your health and to receive a thorough exam.  . Visit your dentist for a routine exam and preventive care every 6 months. Brush your teeth twice a day and floss once a day. Good oral hygiene prevents tooth decay and gum disease.  . The frequency of eye exams is based on your age, health, family medical history, use  of contact lenses, and other factors. Follow your health care provider's recommendations for frequency of eye exams.  . Eat a healthy diet. Foods like vegetables, fruits, whole grains, low-fat dairy products, and lean protein foods contain the nutrients you need without too many calories. Decrease your  intake of foods high in solid fats, added sugars, and salt. Eat the right amount of calories for you. Get information about a proper diet from your health care provider, if necessary.  . Regular physical exercise is one of the most important things you can do for your health. Most adults should get at least 150 minutes of moderate-intensity exercise (any activity that increases your heart rate and causes you to sweat) each week. In addition, most adults need muscle-strengthening exercises on 2 or more days a week.  Silver Sneakers may be a benefit available to you. To determine eligibility, you may visit the website: www.silversneakers.com or contact program at (425)097-2417 Mon-Fri between 8AM-8PM.   . Maintain a healthy weight. The body mass index (BMI) is a screening tool to identify possible weight problems. It provides an estimate of body fat based on height and weight. Your health care provider can find your BMI and can help you achieve or maintain a healthy weight.   For adults 20 years and older: ? A BMI below 18.5 is considered underweight. ? A BMI of 18.5 to 24.9 is normal. ? A BMI of 25 to 29.9 is considered overweight. ? A BMI of 30 and above is considered obese.   . Maintain normal blood lipids and cholesterol levels by exercising and minimizing your intake of saturated fat. Eat a balanced diet with plenty of fruit and vegetables. Blood tests for  lipids and cholesterol should begin at age 20 and be repeated every 5 years. If your lipid or cholesterol levels are high, you are over 50, or you are at high risk for heart disease, you may need your cholesterol levels checked more frequently. Ongoing high lipid and cholesterol levels should be treated with medicines if diet and exercise are not working.  . If you smoke, find out from your health care provider how to quit. If you do not use tobacco, please do not start.  . If you choose to drink alcohol, please do not consume more than 2  drinks per day. One drink is considered to be 12 ounces (355 mL) of beer, 5 ounces (148 mL) of wine, or 1.5 ounces (44 mL) of liquor.  . If you are 42-25 years old, ask your health care provider if you should take aspirin to prevent strokes.  . Use sunscreen. Apply sunscreen liberally and repeatedly throughout the day. You should seek shade when your shadow is shorter than you. Protect yourself by wearing long sleeves, pants, a wide-brimmed hat, and sunglasses year round, whenever you are outdoors.  . Once a month, do a whole body skin exam, using a mirror to look at the skin on your back. Tell your health care provider of new moles, moles that have irregular borders, moles that are larger than a pencil eraser, or moles that have changed in shape or color.

## 2018-08-08 NOTE — Progress Notes (Signed)
Synopsis: Referred in February 2020 for nodules by Tonia Ghent, MD  Subjective:   PATIENT ID: Kara Wright GENDER: female DOB: 1945/10/13, MRN: [id number]  Chief Complaint  Patient presents with  . Consult    consult for pulmonary nodules; SOB, occasional cough w/ brownish colored mucus in the morning, denies chest tightness/pain; had CT scan on 07/29/2018    Past medical history of atrial fib flutter, clotting disorder on Xarelto, hyperlipidemia, hypothyroidism, history of TIA stroke. Patient recently had follow up CT imaging that revealed stable nodule in the LUL and new nodule in the RLL.  In addition the patient has a past 8 to 88-month history of dyspnea on exertion.  She states that she gets "short winded" when she is climbs a hill or climb stairs.  When asked about her sleep her husband pipes up that she does snore at times.  She is not sure if she thinks her sleep is restful or not.  Patient does not wheeze.  She occasionally has random left-sided sharp pains in the chest.  This is been going on for some time.  She follows with cardiology regarding her atrial fibrillation.  Review of her CT imaging today in the office also reveals enlarged pulmonary arteries.  This was discussed with the patient.   Past Medical History:  Diagnosis Date  . Atrial fib/flutter, transient    1 episode, resolved, never on coumadin  . Cataract 2019   left eye  . Clotting disorder ([place])    on xarelto  . Diverticulosis 03/1997  . Dysrhythmia    atrial fibrilation  . GERD (gastroesophageal reflux disease) 1997   good on prilosec  . Hepatitis    hx of hep B (late 1970s)  . Hyperlipidemia 08/1996  . Hypothyroidism 1984  . Migraine    w/o aura  . Osteopenia 11/2002   via dexa  . Presence of dental prosthetic device    temporary while waiting for implant  . Skin cancer    skin cancer  . Stroke [facility]) 08/2011   TIA     Family History  Problem Relation Age of Onset  . Hypertension Mother     . Osteopenia Mother   . Stroke Mother        deceased age 100  . Colon cancer Father 60       mets  . Allergies Sister   . Breast cancer Cousin        pat cousin  . Rectal cancer Neg Hx      Past Surgical History:  Procedure Laterality Date  . ABDOMINAL HYSTERECTOMY    . ANAL FISSURE REPAIR    . CATARACT EXTRACTION W/PHACO Right 02/06/2018   Procedure: CATARACT EXTRACTION PHACO AND INTRAOCULAR LENS PLACEMENT ([place]) RIGHT;  Surgeon: Leandrew Koyanagi, MD;  Location: [facility];  Service: Ophthalmology;  Laterality: Right;  . CHOLECYSTECTOMY  07/2006  . 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 nasolabial fold skin flap surgery  08/02/2007   [facility]  . PARTIAL HYSTERECTOMY  1978-79  . POLYPECTOMY  04/06/97   colon, benign by pathology  . varicose vein ablation sclerotherapy  09/2008   Dr. Hulda Humphrey    Social History   Socioeconomic History  . Marital status: Married    Spouse name: Not on file  . Number of children: 2  . Years of education: Not on file  . Highest education level: Not on file  Occupational History  .  Occupation: Ecologist: RETIRED    Comment: [employer]  . 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 Financial resource strain: Not on file  . Food insecurity:    Worry: Not on file    Inability: Not on file  . Transportation needs:    Medical: Not on file    Non-medical: Not on file  Tobacco Use  . Smoking status: Former Smoker    Packs/day: 1.00    Last attempt to quit: 06/26/1986    Years since quitting: 32.1  . Smokeless tobacco: Never Used  Substance and Sexual Activity  . Alcohol use: No  . Drug use: No  . Sexual activity: Not Currently  Lifestyle  . Physical activity:    Days per week: Not on file    Minutes per session: Not on file  . Stress: Not on file  Relationships  . Social connections:    Talks on phone: Not on file    Gets together:  Not on file    Attends religious service: Not on file    Active member of club or organization: Not on file    Attends meetings of clubs or organizations: Not on file    Relationship status: Not on file  . Intimate partner violence:    Fear of current or ex partner: Not on file    Emotionally abused: Not on file    Physically abused: Not on file    Forced sexual activity: Not on file  Other Topics Concern  . Not on file  Social History Narrative   Works part time at [employer] office   Married 1979   2 daughters from prev relationship   [employer] fan, [employer] fan.     Allergies  Allergen Reactions  . Celebrex [Celecoxib]     REACTION: nausea: GI UPSET  . Doxycycline Other (See Comments)    GI upset- not an allergy.    . Gold-Containing Drug Products   . Nickel     Skin rash, allergy  . Other     Fragrance, phenylenediamine, contrast metal agents, gold  . Zocor [Simvastatin]     Aches but tolerates crestor     Outpatient Medications Prior to Visit  Medication Sig Dispense Refill  . albuterol (PROVENTIL HFA;VENTOLIN HFA) 108 (90 Base) MCG/ACT inhaler Inhale 1-2 puffs into the lungs every 6 (six) hours as needed for wheezing or shortness of breath. 1 Inhaler 0  . Cholecalciferol (VITAMIN D3 PO) Take by mouth daily.    Marland Kitchen diltiazem (CARDIZEM) 30 MG tablet Take 1 tablet (30 mg total) by mouth 3 (three) times daily as needed. 90 tablet 1  . diphenhydrAMINE (BENADRYL) 25 MG tablet Take 25 mg by mouth every 8 (eight) hours as needed for itching.    . levothyroxine (SYNTHROID, LEVOTHROID) 150 MCG tablet Take 1 tablet (150 mcg total) by mouth daily. Except for 1.5 on Sunday 100 tablet 3  . omeprazole (PRILOSEC) 20 MG capsule Take 1 capsule (20 mg total) by mouth daily. 90 capsule 3  . promethazine (PHENERGAN) 25 MG tablet TAKE ONE TABLET BY MOUTH EVERY 6 HOURS AS NEEDED FOR NAUSEA 30 tablet 3  . propranolol ER (INDERAL LA) 120 MG 24 hr capsule Take 1 capsule by mouth daily 90 capsule  3  . rivaroxaban (XARELTO) 20 MG TABS tablet TAKE 1 TABLET BY MOUTH ONCE DAILY WITH  SUPPER 90 tablet 3  . rosuvastatin (CRESTOR)  5 MG tablet Take 1 tablet by mouth at bedtime 90 tablet 2  . traMADol (ULTRAM) 50 MG tablet TAKE 1 TABLET BY MOUTH EVERY 8 HOURS AS NEEDED...CAUTION SEDATION 30 tablet 1   No facility-administered medications prior to visit.     Review of Systems  Constitutional: Negative for chills, fever, malaise/fatigue and weight loss.  HENT: Negative for hearing loss, sore throat and tinnitus.   Eyes: Negative for blurred vision and double vision.  Respiratory: Positive for shortness of breath. Negative for cough, hemoptysis, sputum production, wheezing and stridor.   Cardiovascular: Positive for chest pain. Negative for palpitations, orthopnea, leg swelling and PND.  Gastrointestinal: Negative for abdominal pain, constipation, diarrhea, heartburn, nausea and vomiting.  Genitourinary: Negative for dysuria, hematuria and urgency.  Musculoskeletal: Negative for joint pain and myalgias.  Skin: Negative for itching and rash.  Neurological: Negative for dizziness, tingling, weakness and headaches.  Endo/Heme/Allergies: Negative for environmental allergies. Does not bruise/bleed easily.  Psychiatric/Behavioral: Negative for depression. The patient is not nervous/anxious and does not have insomnia.   All other systems reviewed and are negative.    Objective:  Physical Exam Vitals signs reviewed.  Constitutional:      General: She is not in acute distress.    Appearance: She is well-developed. She is obese.  HENT:     Head: Normocephalic and atraumatic.     Mouth/Throat:     Comments: Mallampati 3 Eyes:     General: No scleral icterus.    Conjunctiva/sclera: Conjunctivae normal.     Pupils: Pupils are equal, round, and reactive to light.  Neck:     Musculoskeletal: Neck supple.     Vascular: No JVD.     Trachea: No tracheal deviation.  Cardiovascular:     Rate and  Rhythm: Normal rate. Rhythm irregular.     Heart sounds: Normal heart sounds. No murmur.  Pulmonary:     Effort: Pulmonary effort is normal. No tachypnea, accessory muscle usage or respiratory distress.     Breath sounds: Normal breath sounds. No stridor. No wheezing, rhonchi or rales.  Abdominal:     General: Bowel sounds are normal. There is no distension.     Palpations: Abdomen is soft.     Tenderness: There is no abdominal tenderness.     Comments: Obese pannus  Musculoskeletal:        General: No tenderness.  Lymphadenopathy:     Cervical: No cervical adenopathy.  Skin:    General: Skin is warm and dry.     Capillary Refill: Capillary refill takes less than 2 seconds.     Findings: No rash.  Neurological:     Mental Status: She is alert and oriented to person, place, and time.  Psychiatric:        Behavior: Behavior normal.      Vitals:   08/08/18 0939  BP: 126/78  Pulse: 72  SpO2: 95%  Weight: 237 lb 12.8 oz (107.9 kg)  Height: 5\' 5"  (1.651 m)   95% on RA BMI Readings from Last 3 Encounters:  08/08/18 39.57 kg/m  05/06/18 38.49 kg/m  02/06/18 38.79 kg/m   Wt Readings from Last 3 Encounters:  08/08/18 237 lb 12.8 oz (107.9 kg)  05/06/18 227 lb 12 oz (103.3 kg)  02/06/18 226 lb (102.5 kg)     CBC    Component Value Date/Time   WBC 6.5 08/06/2017 0914   RBC 4.93 08/06/2017 0914   HGB 13.6 08/06/2017 0914   HGB 13.8 08/26/2011 1334  HCT 41.0 08/06/2017 0914   HCT 41.2 08/26/2011 1334   PLT 163.0 08/06/2017 0914   PLT 156 08/26/2011 1334   MCV 83.1 08/06/2017 0914   MCV 83 08/26/2011 1334   MCH 27.1 10/28/2016 0246   MCHC 33.1 08/06/2017 0914   RDW 14.5 08/06/2017 0914   RDW 13.8 08/26/2011 1334   LYMPHSABS 2.2 08/06/2017 0914   MONOABS 0.6 08/06/2017 0914   EOSABS 0.1 08/06/2017 0914   BASOSABS 0.0 08/06/2017 0914    Chest Imaging: 07/29/2018 CTA chest 10 mm right lower lobe pulmonary nodule, 5 mm right upper lobe pulmonary nodule, left  upper lobe pulmonary nodule The patient's images have been independently reviewed by me.    Pulmonary Functions Testing Results: No flowsheet data found.  FeNO: None   Pathology: None   Echocardiogram:  10/27/2016: Normal ejection fraction 60 to 65%, mild LVH.  Heart Catheterization: None     Assessment & Plan:   Right lower lobe pulmonary nodule - Plan: Pulmonary Function Test  Right upper lobe pulmonary nodule  Left lower lobe pulmonary nodule  Multiple pulmonary nodules  Former smoker  Solitary pulmonary nodule - Plan: CT Chest Wo Contrast  Enlarged pulmonary artery ([place]) - Plan: ECHOCARDIOGRAM COMPLETE  DOE (dyspnea on exertion) - Plan: Pulmonary Function Test  Discussion: This is 73 year old female presents to clinic today for evaluation of multiple pulmonary nodules.  Recent CT chest evaluated a new right lower lobe 10 mm pulmonary nodule, 5 mm right upper lobe pulmonary nodule, left upper lobe nodule.  She has moderate risk for development of lung cancer due to her former smoking history approximately 20 pack years.  Therefore I do believe she would benefit from the [facility] lung lab screening.   As for her enlarged pulmonary arteries on CT she may have mild pulmonary hypertension.  She is morbidly obese and her enlarged pulmonary arteries may be related to underlying obstructive sleep apnea.  We will establish a consultation with 1 of our sleep physicians for further evaluation.  We will also obtain a complete echocardiogram for evaluation of the right ventricle.  As it has been rater than 2 years since her last echo.  We will complete a noncontrasted CT of the chest in 3 months for follow-up of the lung nodules also we may change our plan for lung nodule follow-up pending the blood work.  Patient to have full PFTs upon return. Patient return to clinic in 3 months.  Greater than 50% of this patient's 60-minute of visit was been face-to-face discussing above  recommendations and treatment plan.   Current Outpatient Medications:  .  albuterol (PROVENTIL HFA;VENTOLIN HFA) 108 (90 Base) MCG/ACT inhaler, Inhale 1-2 puffs into the lungs every 6 (six) hours as needed for wheezing or shortness of breath., Disp: 1 Inhaler, Rfl: 0 .  Cholecalciferol (VITAMIN D3 PO), Take by mouth daily., Disp: , Rfl:  .  diltiazem (CARDIZEM) 30 MG tablet, Take 1 tablet (30 mg total) by mouth 3 (three) times daily as needed., Disp: 90 tablet, Rfl: 1 .  diphenhydrAMINE (BENADRYL) 25 MG tablet, Take 25 mg by mouth every 8 (eight) hours as needed for itching., Disp: , Rfl:  .  levothyroxine (SYNTHROID, LEVOTHROID) 150 MCG tablet, Take 1 tablet (150 mcg total) by mouth daily. Except for 1.5 on Sunday, Disp: 100 tablet, Rfl: 3 .  omeprazole (PRILOSEC) 20 MG capsule, Take 1 capsule (20 mg total) by mouth daily., Disp: 90 capsule, Rfl: 3 .  promethazine (PHENERGAN) 25 MG tablet, TAKE  ONE TABLET BY MOUTH EVERY 6 HOURS AS NEEDED FOR NAUSEA, Disp: 30 tablet, Rfl: 3 .  propranolol ER (INDERAL LA) 120 MG 24 hr capsule, Take 1 capsule by mouth daily, Disp: 90 capsule, Rfl: 3 .  rivaroxaban (XARELTO) 20 MG TABS tablet, TAKE 1 TABLET BY MOUTH ONCE DAILY WITH  SUPPER, Disp: 90 tablet, Rfl: 3 .  rosuvastatin (CRESTOR) 5 MG tablet, Take 1 tablet by mouth at bedtime, Disp: 90 tablet, Rfl: 2 .  traMADol (ULTRAM) 50 MG tablet, TAKE 1 TABLET BY MOUTH EVERY 8 HOURS AS NEEDED...CAUTION SEDATION, Disp: 30 tablet, Rfl: 1   Garner Nash, DO [facility] 08/08/2018 10:14 AM

## 2018-08-12 ENCOUNTER — Ambulatory Visit: Payer: PPO

## 2018-08-15 ENCOUNTER — Ambulatory Visit (INDEPENDENT_AMBULATORY_CARE_PROVIDER_SITE_OTHER): Payer: PPO | Admitting: Family Medicine

## 2018-08-15 ENCOUNTER — Encounter: Payer: Self-pay | Admitting: Family Medicine

## 2018-08-15 VITALS — BP 122/64 | HR 83 | Temp 98.3°F | Ht 64.0 in | Wt 235.9 lb

## 2018-08-15 DIAGNOSIS — K219 Gastro-esophageal reflux disease without esophagitis: Secondary | ICD-10-CM

## 2018-08-15 DIAGNOSIS — R911 Solitary pulmonary nodule: Secondary | ICD-10-CM | POA: Diagnosis not present

## 2018-08-15 DIAGNOSIS — I480 Paroxysmal atrial fibrillation: Secondary | ICD-10-CM | POA: Diagnosis not present

## 2018-08-15 DIAGNOSIS — E039 Hypothyroidism, unspecified: Secondary | ICD-10-CM

## 2018-08-15 DIAGNOSIS — E7849 Other hyperlipidemia: Secondary | ICD-10-CM | POA: Diagnosis not present

## 2018-08-15 DIAGNOSIS — Z0000 Encounter for general adult medical examination without abnormal findings: Secondary | ICD-10-CM

## 2018-08-15 DIAGNOSIS — M25559 Pain in unspecified hip: Secondary | ICD-10-CM | POA: Diagnosis not present

## 2018-08-15 DIAGNOSIS — Z7189 Other specified counseling: Secondary | ICD-10-CM

## 2018-08-15 MED ORDER — LEVOTHYROXINE SODIUM 150 MCG PO TABS: 150.0000 ug | ORAL_TABLET | Freq: Every day | ORAL | 3 refills | Status: DC

## 2018-08-15 MED ORDER — TRAMADOL HCL 50 MG PO TABS: ORAL_TABLET | ORAL | 2 refills | Status: DC

## 2018-08-15 NOTE — Patient Instructions (Signed)
I'll await the follow up test and notes.  Don't change your meds for now.  Update me as needed.   Take care.  Glad to see you.

## 2018-08-15 NOTE — Progress Notes (Signed)
Hearing aids declined, d/w pt.   Mammogram 2019 DXA 2018 with osteoporosis.  Vit D wnl now.  Defer tx for now, given the issues below.   Living will d/w pt. Would have her daughter Heriberto [place]ua designated if incapacitated. Her husband has parkinson's sx and that is affecting his memory per patient report.   Vaccines up to date except for shingrix, out of stock.   Colonoscopy 2019.   She had pulmonary f/u pending with echo pending. D/w pt.  She has sleep apnea eval pending.    She'll update [place] about allergy list.  D/w pt.    Hypothyroidism. TSH wnl.  No neck mass, no dysphagia.  Compliant.    GERD.  Controlled with PPI.  No ADE.  Compliant.  Elevated Cholesterol: Using medications without problems:yes Muscle aches: no Diet compliance: encouraged.   Exercise: encouraged, as tolerated.   AF.  Anticoagulated.  Still on baseline beta blocker.  No bleeding.  No CP.  She has some SOB with walking distance, likely exacerbated by deconditioning, after 0.25 miles with echo pending.   Labs discussed with patient.  Her husband was really sick last year and caring for him likely was a sig stressor, that caused more nausea at the time.   Her situation and nausea is better now.  She didn't have other abd sx.  R hip pain better with tramadol, used prn, no ADE on med.  More pain at night, laying on her side.    Icing didn't help.    PMH and SH reviewed  ROS: Per HPI unless specifically indicated in ROS section   Meds, vitals, and allergies reviewed.   GEN: nad, alert and oriented HEENT: mucous membranes moist NECK: supple w/o LA CV: rrr PULM: ctab, no inc wob ABD: soft, +bs EXT: no edema SKIN: no acute rash

## 2018-08-16 ENCOUNTER — Ambulatory Visit (HOSPITAL_COMMUNITY)
Admission: RE | Admit: 2018-08-16 | Discharge: 2018-08-16 | Disposition: A | Discharge status: Home / Self Care | Payer: PPO | Source: Ambulatory Visit | Attending: Pulmonary Disease | Admitting: Pulmonary Disease

## 2018-08-16 DIAGNOSIS — I272 Pulmonary hypertension, unspecified: Secondary | ICD-10-CM | POA: Insufficient documentation

## 2018-08-16 DIAGNOSIS — I288 Other diseases of pulmonary vessels: Secondary | ICD-10-CM

## 2018-08-16 DIAGNOSIS — I358 Other nonrheumatic aortic valve disorders: Secondary | ICD-10-CM | POA: Insufficient documentation

## 2018-08-16 NOTE — Progress Notes (Signed)
  Echocardiogram 2D Echocardiogram has been performed.  Kara Wright 08/16/2018, 3:44 PM

## 2018-08-18 NOTE — Assessment & Plan Note (Signed)
TSH wnl.  No neck mass, no dysphagia.  Compliant.   Labs discussed with patient.

## 2018-08-18 NOTE — Assessment & Plan Note (Signed)
Living will d/w pt. Would have her daughter Chantelle Burns designated if incapacitated. Her husband has parkinson's sx and that is affecting his memory per patient report.  

## 2018-08-18 NOTE — Assessment & Plan Note (Signed)
Continue statin.  Continue work on diet and exercise.   

## 2018-08-18 NOTE — Assessment & Plan Note (Signed)
R hip pain better with tramadol, used prn, no ADE on med.  More pain at night, laying on her side.    Icing didn't help.   Continue tramadol as needed. >25 minutes spent in face to face time with patient, >50% spent in counselling or coordination of care.

## 2018-08-18 NOTE — Assessment & Plan Note (Signed)
Controlled with PPI.  No ADE.  Compliant.  Continue as is.  Continue work on diet and exercise.

## 2018-08-18 NOTE — Assessment & Plan Note (Signed)
Hearing aids declined, d/w pt.   Mammogram 2019 DXA 2018 with osteoporosis.  Vit D wnl now.  Defer tx for now, given the issues above.   Living will d/w pt. Would have her daughter Heriberto [place] designated if incapacitated. Her husband has parkinson's sx and that is affecting his memory per patient report.   Vaccines up to date except for shingrix, out of stock.   Colonoscopy 2019.

## 2018-08-18 NOTE — Assessment & Plan Note (Signed)
Anticoagulated.  Still on baseline beta blocker.  No bleeding.  No CP.  She has some SOB with walking distance, likely exacerbated by deconditioning, after 0.25 miles with echo pending.  I will await the follow-up testing.

## 2018-08-22 ENCOUNTER — Telehealth: Payer: Self-pay

## 2018-08-22 NOTE — Telephone Encounter (Signed)
LMTCB. Please let patient know we got the results of her lung nodule blood test. Per BI the test is showing low malignancy so this is good news.

## 2018-08-22 NOTE — Telephone Encounter (Signed)
LMTCB. Please let patient know we got the results of her (Biodesox) or lung nodule blood test. Per BI the test is showing low risk for malignancy and likely benign. This is good news.

## 2018-08-23 NOTE — Telephone Encounter (Signed)
LMTCB. LMTCB. Please let patient know we got the results of her (Biodesox) or lung nodule blood test. Per BI the test is showing low risk for malignancy and likely benign. This is good news.

## 2018-08-23 NOTE — Telephone Encounter (Signed)
Patient calling back for results of blood test.

## 2018-08-23 NOTE — Telephone Encounter (Signed)
LMTCB. Please let patient know we got the results of her (Biodesox) or lung nodule blood test. Per BI the test is showing low risk for malignancy and likely benign. This is good news.

## 2018-08-27 NOTE — Telephone Encounter (Signed)
LMTCB. Will send letter.

## 2018-08-28 NOTE — Telephone Encounter (Signed)
Letter printed and mailed to patient. Will close message.

## 2018-09-02 DIAGNOSIS — L235 Allergic contact dermatitis due to other chemical products: Secondary | ICD-10-CM | POA: Diagnosis not present

## 2018-09-05 ENCOUNTER — Telehealth: Payer: Self-pay | Admitting: Pulmonary Disease

## 2018-09-05 NOTE — Telephone Encounter (Signed)
Message from previously closed encounter: Please let patient know we got the results of her (Biodesox) or lung nodule blood test. Per BI the test is showing low risk for malignancy and likely benign. This is good news.  Called and spoke with pt letting her know the information stated above. Pt expressed understanding. nothing further needed.

## 2018-09-05 NOTE — Telephone Encounter (Signed)
LMTCB

## 2018-09-05 NOTE — Telephone Encounter (Signed)
Patient is returning phone number.  Patient phone number is [phone] c or [phone] w.

## 2018-09-09 ENCOUNTER — Other Ambulatory Visit: Payer: Self-pay

## 2018-09-09 NOTE — Telephone Encounter (Signed)
Pt left v/m that [facility] will not fill the tramadol and pt was advised by [facility] to get filled at [facility]. Pt last seen 08/15/18 when tramadol was sent electronically to [facility].Please advise.

## 2018-09-10 MED ORDER — TRAMADOL HCL 50 MG PO TABS: ORAL_TABLET | ORAL | 2 refills | Status: DC

## 2018-09-10 NOTE — Telephone Encounter (Signed)
Sent. Thanks.   

## 2018-09-11 ENCOUNTER — Encounter: Payer: Self-pay | Admitting: *Deleted

## 2018-09-11 ENCOUNTER — Telehealth (INDEPENDENT_AMBULATORY_CARE_PROVIDER_SITE_OTHER): Payer: PPO | Admitting: Family Medicine

## 2018-09-11 ENCOUNTER — Other Ambulatory Visit: Payer: Self-pay

## 2018-09-11 ENCOUNTER — Encounter: Payer: Self-pay | Admitting: Family Medicine

## 2018-09-11 DIAGNOSIS — R509 Fever, unspecified: Secondary | ICD-10-CM | POA: Diagnosis not present

## 2018-09-11 DIAGNOSIS — R05 Cough: Secondary | ICD-10-CM

## 2018-09-11 DIAGNOSIS — M791 Myalgia, unspecified site: Secondary | ICD-10-CM | POA: Diagnosis not present

## 2018-09-11 DIAGNOSIS — R6889 Other general symptoms and signs: Secondary | ICD-10-CM

## 2018-09-11 MED ORDER — CEFDINIR 300 MG PO CAPS: 600.0000 mg | ORAL_CAPSULE | Freq: Every day | ORAL | 0 refills | Status: AC

## 2018-09-11 NOTE — Progress Notes (Signed)
Takeshi Teasdale T. Braxtyn Bojarski, MD Primary Care and Sports Medicine [facility] at [facility] [place], [address] Phone: [phone]  FAX: [phone]  Kara Wright - 73 y.o. female  MRN [id number]  Date of Birth: 1946/02/14  Visit Date: 09/11/2018  PCP: Tonia Ghent, MD  Referred by: Tonia Ghent, MD  Virtual Visit via Telephone Note:  I connected with  [name] on 09/11/2018  2:40 PM EDT by telephone and verified that I am speaking with the correct person using two identifiers.   Location patient: home phone or cell phone Location provider: work or home office Consent: Verbal consent directly obtained from [name] and that there may be a patient responsible charge related to this service. Persons participating in the virtual visit: patient, provider  I discussed the limitations of evaluation and management by telemedicine and the availability of in person appointments.  The patient expressed understanding and agreed to proceed.     History of Present Illness:  Cough and congestion up to 101.  Coughing up some phlegm.  Has a headache, did have some earache.  Achy.  No urgency, no n/v/d.  Ate some breakfast with eggs, pot, cheese, drank some juice.    1988 stoped smoking.   Some pain in the legs, in the thigh region.   She is not having any significant shortness of breath.  She does have a grandchild who is sick right now.  She is walking around easily and in no acute discomfort per her report.  + mild arthralgia  Review of Systems: pertinent positives and pertinent negatives as per HPI No acute distress verbally  Past Medical History, Surgical History, Social History, Family History, Problem List, Medications, and Allergies have been reviewed and updated if relevant.   Observations/Objective/Exam:  An attempt was made to discern vital signs over the phone and per patient if applicable and possible.    Pulmonary:     Effort: Pulmonary effort is normal. No respiratory distress.  Neurological:     Mental Status: He is alert and oriented to person, place, and time.  Psychiatric:        Thought Content: Thought content normal.        Judgment: Judgment normal.   Flu-like symptoms  Fever to 101, polyarthralgias, mild production of sputum.  Influenza cannot be excluded, bronchitis or pneumonia also possible, COVID-19 also cannot fully be excluded.  She does not sound to be in any respiratory distress and is very comfortable on the telephone.  Following current guidelines and recommendations, recommended that she stay quarantined at home for a minimum of 7 days and she needs to be afebrile off of all medication.  I am in a place her on some Omnicef as well, and recommended Tylenol as well as other supportive care.  I discussed the assessment and treatment plan with the patient. The patient was provided an opportunity to ask questions and all were answered. The patient agreed with the plan and demonstrated an understanding of the instructions.   The patient was advised to call back or seek an in-person evaluation if the symptoms worsen or if the condition fails to improve as anticipated.  I provided 22 minutes of non-face-to-face time during this encounter.  Follow-up: prn unless noted otherwise below No follow-ups on file.  Meds ordered this encounter  Medications  . cefdinir (OMNICEF) 300 MG capsule    Sig: Take 2 capsules (600 mg total) by mouth  daily for 10 days.    Dispense:  20 capsule    Refill:  0   No orders of the defined types were placed in this encounter.   Signed,  Maud Deed. Catelin Manthe, MD

## 2018-09-12 ENCOUNTER — Other Ambulatory Visit: Payer: Self-pay | Admitting: *Deleted

## 2018-09-12 MED ORDER — OMEPRAZOLE 20 MG PO CPDR: 20.0000 mg | DELAYED_RELEASE_CAPSULE | Freq: Every day | ORAL | 3 refills | Status: DC

## 2018-09-18 ENCOUNTER — Ambulatory Visit (INDEPENDENT_AMBULATORY_CARE_PROVIDER_SITE_OTHER): Payer: PPO | Admitting: Family Medicine

## 2018-09-18 ENCOUNTER — Telehealth: Payer: Self-pay

## 2018-09-18 ENCOUNTER — Other Ambulatory Visit: Payer: Self-pay

## 2018-09-18 DIAGNOSIS — Z20828 Contact with and (suspected) exposure to other viral communicable diseases: Secondary | ICD-10-CM | POA: Diagnosis not present

## 2018-09-18 DIAGNOSIS — Z20822 Contact with and (suspected) exposure to covid-19: Secondary | ICD-10-CM | POA: Insufficient documentation

## 2018-09-18 NOTE — Telephone Encounter (Signed)
Noted. Thanks.

## 2018-09-18 NOTE — Telephone Encounter (Signed)
Received fax from [employer] following up on PA for Tramadol.  PA completed on Cover[employer]Meds.   No PA required per CMM.

## 2018-09-18 NOTE — Progress Notes (Signed)
Virtual visit completed through WebEx.  Patient location: home  Provider location: [place] at [facility], office   Husband was diagnosed with covid.  He is being transferred to [facility] from [facility] per patient report.  He was admitted on 09/15/2018, initially with high sugar but that may have been related to the infection.  She hasn't been able to see patient since admission.    She is dealing with the separation.  Patient is now quarantined.  She doesn't have fever, cough, SOB now.  She feels better from compared to last week.    She had phone visit with Dr. Lorelei Pont on 09/11/2018.  Still on omnicef from that visit.  Still with diarrhea, since 09/10/2018, that predates omnicef use.  No fever for the last 6 days.    She last had contact with her husband on 09/15/2018.    Meds and allergies reviewed.   ROS: Per HPI unless specifically indicated in ROS section   nad ncat Speaking in complete sentences.    A/P:   Coronavirus exposure.  She doesn't have new or emergent sx. no fever, shortness of breath, cough.  If she develops any of those symptoms then I want her to let us know.  We talked about the timeline of her quarantine.  I think it makes sense to start her quarantined date at the last contact with her husband.  Discussed rationale for using the latest possible date to start the quarantine.  She agreed.   Start quarantine on 09/15/2018.  Plan for 7-day quarantine at this point.  Update [place] as needed in the meantime.  I think it makes sense to still finish the antibiotics.  She is putting up with the diarrhea that predates antibiotic use.  Reasonable to try Imodium as needed in the meantime.  We talked about the logistics of her quarantine.  She has plenty of groceries and is safe at home.  She will update [place] as needed.  She thanked me for the call.  I appreciate her making this visit possible via WebEx.

## 2018-09-18 NOTE — Assessment & Plan Note (Signed)
Coronavirus exposure.  She doesn't have new or emergent sx. no fever, shortness of breath, cough.  If she develops any of those symptoms then I want her to let us know.  We talked about the timeline of her quarantine.  I think it makes sense to start her quarantined date at the last contact with her husband.  Discussed rationale for using the latest possible date to start the quarantine.  She agreed.   Start quarantine on 09/15/2018.  Plan for 7-day quarantine at this point.  Update [place] as needed in the meantime.  I think it makes sense to still finish the antibiotics.  She is putting up with the diarrhea that predates antibiotic use.  Reasonable to try Imodium as needed in the meantime.  We talked about the logistics of her quarantine.  She has plenty of groceries and is safe at home.  She will update [place] as needed.  She thanked me for the call.  I appreciate her making this visit possible via WebEx.

## 2018-09-18 NOTE — Telephone Encounter (Signed)
Received inbound call from patient reporting spouse has been diagnosed with COVID-19. Patient recently has televisit on 09/11/18 with Dr. Lorelei Pont and was diagnosed with flu-like symptoms. Patient still has diarrhea, congestion, generalized weakness, fatigue, and feeling lightheaded. Denies nausea, vomiting, and fever.   Still taking antibiotics with anticipated completion on 09/21/18.   Patient is able to complete virtual video appointment. Appt scheduled 09/18/18 @ 1300.

## 2018-10-22 ENCOUNTER — Other Ambulatory Visit: Payer: Self-pay | Admitting: *Deleted

## 2018-10-22 MED ORDER — OMEPRAZOLE 20 MG PO CPDR: 20.0000 mg | DELAYED_RELEASE_CAPSULE | Freq: Every day | ORAL | 3 refills | Status: DC

## 2018-10-22 MED ORDER — VITAMIN D3 50 MCG (2000 UT) PO CAPS: 2000.0000 [IU] | ORAL_CAPSULE | Freq: Every day | ORAL | 3 refills | Status: DC

## 2018-10-22 MED ORDER — LEVOTHYROXINE SODIUM 150 MCG PO TABS: 150.0000 ug | ORAL_TABLET | Freq: Every day | ORAL | 3 refills | Status: DC

## 2018-10-22 NOTE — Telephone Encounter (Signed)
Received faxes from [facility] requesting refills on multiple prescriptions.  I did check with Ms. Fiser to make sure is was changing her pharmacy to [facility].   Looks like Dr. Rockey Situ prescribes her Xarelto, Propranalol & Crestor.  Not sure about the Cardizem because according to her chart is was last refilled 04/05/2015 for #90 with 1 refill.   Phenergan last refilled 12/25/2017 for #30 with 3 refills by Dr. Damita Dunnings.  Will forward to Dr. Damita Dunnings to approve that one.  Then I would forward the rest to Dr. Rockey Situ to approve the rest.

## 2018-10-23 MED ORDER — PROMETHAZINE HCL 25 MG PO TABS: 25.0000 mg | ORAL_TABLET | Freq: Four times a day (QID) | ORAL | 3 refills | Status: DC | PRN

## 2018-10-23 NOTE — Telephone Encounter (Signed)
I sent the phenergan and routed the others to Dr. Rockey Situ.  I appreciate all involved.

## 2018-10-24 ENCOUNTER — Other Ambulatory Visit: Payer: Self-pay | Admitting: Family Medicine

## 2018-10-28 NOTE — Telephone Encounter (Signed)
Dr. Donivan Scull office has not responded to refill request.  Are you okay refilling these medications or do we wait on Dr. Donivan Scull office?

## 2018-10-29 MED ORDER — DILTIAZEM HCL 30 MG PO TABS: 30.0000 mg | ORAL_TABLET | Freq: Three times a day (TID) | ORAL | 1 refills | Status: DC | PRN

## 2018-10-29 MED ORDER — ROSUVASTATIN CALCIUM 5 MG PO TABS: 5.0000 mg | ORAL_TABLET | Freq: Every day | ORAL | 2 refills | Status: DC

## 2018-10-29 MED ORDER — PROPRANOLOL HCL ER 120 MG PO CP24: 120.0000 mg | ORAL_CAPSULE | Freq: Every day | ORAL | 3 refills | Status: DC

## 2018-10-29 MED ORDER — RIVAROXABAN 20 MG PO TABS: ORAL_TABLET | ORAL | 3 refills | Status: DC

## 2018-10-29 NOTE — Telephone Encounter (Signed)
I sent them in the meantime.  Thanks.

## 2018-11-06 ENCOUNTER — Telehealth (HOSPITAL_COMMUNITY): Payer: Self-pay

## 2018-11-14 ENCOUNTER — Ambulatory Visit: Payer: PPO | Admitting: Pulmonary Disease

## 2018-11-20 ENCOUNTER — Encounter: Payer: Self-pay | Admitting: Family Medicine

## 2018-11-20 ENCOUNTER — Ambulatory Visit (INDEPENDENT_AMBULATORY_CARE_PROVIDER_SITE_OTHER): Payer: PPO | Admitting: Family Medicine

## 2018-11-20 VITALS — BP 130/70 | HR 74 | Temp 98.4°F | Ht 64.0 in | Wt 237.0 lb

## 2018-11-20 DIAGNOSIS — H6692 Otitis media, unspecified, left ear: Secondary | ICD-10-CM | POA: Diagnosis not present

## 2018-11-20 MED ORDER — AMOXICILLIN-POT CLAVULANATE 875-125 MG PO TABS: 1.0000 | ORAL_TABLET | Freq: Two times a day (BID) | ORAL | 0 refills | Status: AC

## 2018-11-20 NOTE — Assessment & Plan Note (Addendum)
Story/exam consistent with acute otitis on left. Treat with augmentin 10d course. Discussed scheduled tylenol 500mg  TID with tramadol for breakthrough pain. Update if not improving with abx course. Pt agrees with plan.

## 2018-11-20 NOTE — Progress Notes (Signed)
Virtual visit completed through [employer]. Due to national recommendations of social distancing due to COVID-19, a virtual visit is felt to be most appropriate for this patient at this time. Reviewed limitations of a virtual visit.   Patient location: home Provider location: [place] at [facility], office If any vitals were documented, they were collected by patient at home unless specified below.    After discussion with patient, I did recommend in office evaluation for ear exam - she came in for this.   BP 130/70 (BP Location: Left Arm, Patient Position: Sitting, Cuff Size: Large)    Pulse 74    Temp 98.4 F (36.9 C) (Oral)    Ht 5\' 4"  (1.626 m)    Wt 237 lb (107.5 kg)    SpO2 93%    BMI 40.68 kg/m    CC: L ear pain Subjective:    Patient ID: Kara Wright, female    DOB: [birth year], 73 y.o.   MRN: [id number]  HPI: Kara Wright is a 73 y.o. female presenting on 11/20/2018 for Ear Pain (C/o bilateral ear pain- left worse, nasal congestion and cough. Sxs last week. )   L earache as well as some itching externally that started yesterday. Soreness to palpation externally. Itching present for weeks, pain started yesterday. Denies trauma to ear.   No fevers/chills, coughing, dyspnea, no congestion, rhinorrhea or ST or PNdrainage.  No hearing changes, tinnitus, drainage from ear.  No nausea, vertigo.   So far has tried tylenol and tramadol last night with benefit.   Daughter sick recently with stomach virus.   Husband hospitalized with hip fracture after fall and incidentally found to have positive Covid19 09/2018 - he has recovered well - did have hip replacement.  She thinks she did have Covid19 at end of March given husband's illness and her similar illness 1 wk prior.   She currently denies fever, cough, dyspnea, new congestion, ST.      Relevant past medical, surgical, family and social history reviewed and updated as indicated. Interim medical history since our last visit  reviewed. Allergies and medications reviewed and updated. Outpatient Medications Prior to Visit  Medication Sig Dispense Refill   Cholecalciferol (VITAMIN D3) 50 MCG (2000 UT) capsule Take 1 capsule (2,000 Units total) by mouth daily. 90 capsule 3   diltiazem (CARDIZEM) 30 MG tablet Take 1 tablet (30 mg total) by mouth 3 (three) times daily as needed. 90 tablet 1   diphenhydrAMINE (BENADRYL) 25 MG tablet Take 25 mg by mouth every 8 (eight) hours as needed for itching.     levothyroxine (SYNTHROID) 150 MCG tablet Take 1 tablet (150 mcg total) by mouth daily. Except for 1.5 on Sunday 100 tablet 3   loperamide (IMODIUM A-D) 2 MG tablet Take 2 mg by mouth 2 (two) times daily as needed for diarrhea or loose stools.     omeprazole (PRILOSEC) 20 MG capsule Take 1 capsule (20 mg total) by mouth daily. 90 capsule 3   promethazine (PHENERGAN) 25 MG tablet Take 1 tablet (25 mg total) by mouth every 6 (six) hours as needed. for nausea 30 tablet 3   propranolol ER (INDERAL LA) 120 MG 24 hr capsule Take 1 capsule (120 mg total) by mouth daily. 90 capsule 3   rivaroxaban (XARELTO) 20 MG TABS tablet TAKE 1 TABLET BY MOUTH ONCE DAILY WITH  SUPPER 90 tablet 3   rosuvastatin (CRESTOR) 5 MG tablet Take 1 tablet (5 mg total) by mouth at bedtime. Baxter  tablet 2   traMADol (ULTRAM) 50 MG tablet TAKE 1 TABLET BY MOUTH EVERY 8 HOURS AS NEEDED...CAUTION SEDATION 30 tablet 2   albuterol (PROVENTIL HFA;VENTOLIN HFA) 108 (90 Base) MCG/ACT inhaler Inhale 1-2 puffs into the lungs every 6 (six) hours as needed for wheezing or shortness of breath. 1 Inhaler 0   No facility-administered medications prior to visit.      Per HPI unless specifically indicated in ROS section below Review of Systems Objective:    BP 130/70 (BP Location: Left Arm, Patient Position: Sitting, Cuff Size: Large)    Pulse 74    Temp 98.4 F (36.9 C) (Oral)    Ht 5\' 4"  (1.626 m)    Wt 237 lb (107.5 kg)    SpO2 93%    BMI 40.68 kg/m   Wt  Readings from Last 3 Encounters:  11/20/18 237 lb (107.5 kg)  08/15/18 235 lb 14.4 oz (107 kg)  08/08/18 234 lb 3.2 oz (106.2 kg)     Physical exam: Gen: alert, NAD, not ill appearing Pulm: speaks in complete sentences without increased work of breathing Psych: normal mood, normal thought content   I asked her to come into office for ear exam HEENT - R canal with cerumen otherwise clear. L ear tender to palpation at pinna and posterior to ear, L external canal mildly edematous, L TM dull, erythematous, bulging with poor light reflex. No perforation appreciated. Tender pre/postauricular LAD on left. Mouth clear, MMM, no oropharyngeal erythema/exudates     Assessment & Plan:   Problem List Items Addressed This Visit    Left acute otitis media - Primary    Story/exam consistent with acute otitis on left. Treat with augmentin 10d course. Discussed scheduled tylenol 500mg  TID with tramadol for breakthrough pain. Update if not improving with abx course. Pt agrees with plan.       Relevant Medications   amoxicillin-clavulanate (AUGMENTIN) 875-125 MG tablet       Meds ordered this encounter  Medications   amoxicillin-clavulanate (AUGMENTIN) 875-125 MG tablet    Sig: Take 1 tablet by mouth 2 (two) times daily for 10 days.    Dispense:  20 tablet    Refill:  0   No orders of the defined types were placed in this encounter.   I discussed the assessment and treatment plan with the patient. The patient was provided an opportunity to ask questions and all were answered. The patient agreed with the plan and demonstrated an understanding of the instructions. The patient was advised to call back or seek an in-person evaluation if the symptoms worsen or if the condition fails to improve as anticipated.  Follow up plan: Return if symptoms worsen or fail to improve.  [name], MD

## 2018-11-20 NOTE — Patient Instructions (Signed)

## 2018-11-25 DIAGNOSIS — L235 Allergic contact dermatitis due to other chemical products: Secondary | ICD-10-CM | POA: Diagnosis not present

## 2018-11-25 DIAGNOSIS — L308 Other specified dermatitis: Secondary | ICD-10-CM | POA: Diagnosis not present

## 2018-11-25 DIAGNOSIS — L83 Acanthosis nigricans: Secondary | ICD-10-CM | POA: Diagnosis not present

## 2018-11-29 DIAGNOSIS — H2512 Age-related nuclear cataract, left eye: Secondary | ICD-10-CM | POA: Diagnosis not present

## 2018-12-11 ENCOUNTER — Ambulatory Visit: Payer: PPO

## 2018-12-12 ENCOUNTER — Ambulatory Visit: Payer: PPO

## 2018-12-12 ENCOUNTER — Other Ambulatory Visit: Payer: Self-pay | Admitting: Pulmonary Disease

## 2018-12-13 ENCOUNTER — Other Ambulatory Visit: Payer: Self-pay

## 2018-12-13 ENCOUNTER — Ambulatory Visit
Admission: RE | Admit: 2018-12-13 | Discharge: 2018-12-13 | Disposition: A | Discharge status: Home / Self Care | Payer: PPO | Source: Ambulatory Visit | Attending: Pulmonary Disease | Admitting: Pulmonary Disease

## 2018-12-13 DIAGNOSIS — R911 Solitary pulmonary nodule: Secondary | ICD-10-CM | POA: Diagnosis not present

## 2018-12-13 DIAGNOSIS — R918 Other nonspecific abnormal finding of lung field: Secondary | ICD-10-CM | POA: Diagnosis not present

## 2018-12-14 ENCOUNTER — Other Ambulatory Visit (HOSPITAL_COMMUNITY)
Admission: RE | Admit: 2018-12-14 | Discharge: 2018-12-14 | Disposition: A | Discharge status: Home / Self Care | Payer: PPO | Source: Ambulatory Visit | Attending: Pulmonary Disease | Admitting: Pulmonary Disease

## 2018-12-14 DIAGNOSIS — Z1159 Encounter for screening for other viral diseases: Secondary | ICD-10-CM | POA: Diagnosis not present

## 2018-12-14 LAB — SARS CORONAVIRUS 2 (TAT 6-24 HRS): SARS Coronavirus 2: NEGATIVE

## 2018-12-18 ENCOUNTER — Encounter: Payer: Self-pay | Admitting: Pulmonary Disease

## 2018-12-18 ENCOUNTER — Ambulatory Visit: Payer: PPO

## 2018-12-18 ENCOUNTER — Ambulatory Visit: Payer: PPO | Admitting: Pulmonary Disease

## 2018-12-18 ENCOUNTER — Other Ambulatory Visit: Payer: Self-pay

## 2018-12-18 VITALS — BP 128/74 | HR 54 | Ht 64.5 in | Wt 238.2 lb

## 2018-12-18 DIAGNOSIS — Z87891 Personal history of nicotine dependence: Secondary | ICD-10-CM

## 2018-12-18 DIAGNOSIS — R911 Solitary pulmonary nodule: Secondary | ICD-10-CM

## 2018-12-18 DIAGNOSIS — R918 Other nonspecific abnormal finding of lung field: Secondary | ICD-10-CM | POA: Diagnosis not present

## 2018-12-18 DIAGNOSIS — R0609 Other forms of dyspnea: Secondary | ICD-10-CM

## 2018-12-18 LAB — PULMONARY FUNCTION TEST
DL/VA % pred: 98 %
DL/VA: 4.06 ml/min/mmHg/L
DLCO unc % pred: 74 %
DLCO unc: 14.66 ml/min/mmHg
FEF 25-75 Post: 1.04 L/sec
FEF 25-75 Pre: 1.08 L/sec
FEF2575-%Change-Post: -3 %
FEF2575-%Pred-Post: 56 %
FEF2575-%Pred-Pre: 58 %
FEV1-%Change-Post: -1 %
FEV1-%Pred-Post: 60 %
FEV1-%Pred-Pre: 61 %
FEV1-Post: 1.36 L
FEV1-Pre: 1.37 L
FEV1FVC-%Change-Post: -2 %
FEV1FVC-%Pred-Pre: 102 %
FEV6-%Change-Post: 1 %
FEV6-%Pred-Post: 63 %
FEV6-%Pred-Pre: 62 %
FEV6-Post: 1.81 L
FEV6-Pre: 1.78 L
FEV6FVC-%Pred-Post: 104 %
FEV6FVC-%Pred-Pre: 104 %
FVC-%Change-Post: 1 %
FVC-%Pred-Post: 60 %
FVC-%Pred-Pre: 59 %
FVC-Post: 1.81 L
FVC-Pre: 1.78 L
Post FEV1/FVC ratio: 75 %
Post FEV6/FVC ratio: 100 %
Pre FEV1/FVC ratio: 77 %
Pre FEV6/FVC Ratio: 100 %
RV % pred: 106 %
RV: 2.41 L
TLC % pred: 83 %
TLC: 4.26 L

## 2018-12-18 MED ORDER — ALBUTEROL SULFATE HFA 108 (90 BASE) MCG/ACT IN AERS: 2.0000 | INHALATION_SPRAY | Freq: Four times a day (QID) | RESPIRATORY_TRACT | 3 refills | Status: DC | PRN

## 2018-12-18 NOTE — Progress Notes (Signed)
Synopsis: Referred in February 2020 for nodules by Tonia Ghent, MD  Subjective:   PATIENT ID: Kara Wright GENDER: female DOB: Oct 23, 1945, MRN: [id number]  Chief Complaint  Patient presents with  . Follow-up    PFT today. No new concerns. She states since having Covid her SOB is significantly worse.    Past medical history of atrial fib flutter, clotting disorder on Xarelto, hyperlipidemia, hypothyroidism, history of TIA stroke. Patient recently had follow up CT imaging that revealed stable nodule in the LUL and new nodule in the RLL.  In addition the patient has a past 8 to 71-month history of dyspnea on exertion.  She states that she gets "short winded" when she is climbs a hill or climb stairs.  When asked about her sleep her husband pipes up that she does snore at times.  She is not sure if she thinks her sleep is restful or not.  Patient does not wheeze.  She occasionally has random left-sided sharp pains in the chest.  This is been going on for some time.  She follows with cardiology regarding her atrial fibrillation.  Review of her CT imaging today in the office also reveals enlarged pulmonary arteries.  This was discussed with the patient.  OV 12/18/2018: Patient seen here today for follow-up regarding multiple pulmonary nodules.  Patient had CT imaging of the chest completed on 12/13/2018 for follow-up regarding her nodules.  There is a new small 5 mm nodule in the right pulmonary apex a new 4 mm nodule in the anterior left upper lobe prior nodules either resolved or stable. Today, she states that she has breathing better.  She does have some dyspnea on exertion.  She was unable to get her sleep appointment scheduled.  We will try again for this.  She was very anxious about having the CT scan for the follow-up of the nodules.  She is relieved to hear that they are smaller.  She does understand there is a few new ones that need follow-up.  Otherwise doing well.  She had PFTs completed  prior to today's office which which we reviewed.  It does reveal a reduced FEV1 and FVC with a reserved ratio and a reduced ERV of 10%.  DLCO of 74%.  This is likely related to her weight.  BMI of 40.  We discussed this today in the office as well.  She is trying to watch her weight and her dietary intake.  She does understand importance of losing weight.     Past Medical History:  Diagnosis Date  . Atrial fib/flutter, transient    1 episode, resolved, never on coumadin  . Cataract 2019   left eye  . Clotting disorder ([place])    on xarelto  . Diverticulosis 03/1997  . Dysrhythmia    atrial fibrilation  . GERD (gastroesophageal reflux disease) 1997   good on prilosec  . Hepatitis    hx of hep B (late 1970s)  . Hyperlipidemia 08/1996  . Hypothyroidism 1984  . Migraine    w/o aura  . Osteopenia 11/2002   via dexa  . Presence of dental prosthetic device    temporary while waiting for implant  . Skin cancer    skin cancer  . Stroke [facility]) 08/2011   TIA     Family History  Problem Relation Age of Onset  . Hypertension Mother   . Osteopenia Mother   . Stroke Mother        deceased age 87  .  Colon cancer Father 60       mets  . Allergies Sister   . Breast cancer Cousin        pat cousin  . Rectal cancer Neg Hx      Past Surgical History:  Procedure Laterality Date  . ABDOMINAL HYSTERECTOMY    . ANAL FISSURE REPAIR    . CATARACT EXTRACTION W/PHACO Right 02/06/2018   Procedure: CATARACT EXTRACTION PHACO AND INTRAOCULAR LENS PLACEMENT ([place]) RIGHT;  Surgeon: Leandrew Koyanagi, MD;  Location: [facility] [facility];  Service: Ophthalmology;  Laterality: Right;  . CHOLECYSTECTOMY  07/2006  . 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 nasolabial fold skin flap surgery  08/02/2007   [facility]  . PARTIAL HYSTERECTOMY  1978-79  . POLYPECTOMY  04/06/97   colon, benign by pathology  . varicose vein ablation sclerotherapy  09/2008   Dr. Hulda Humphrey    Social  History   Socioeconomic History  . Marital status: Married    Spouse name: Not on file  . Number of children: 2  . Years of education: Not on file  . Highest education level: Not on file  Occupational History  . Occupation: Ecologist: RETIRED    Comment: [employer]  . 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 Financial resource strain: Not on file  . Food insecurity    Worry: Not on file    Inability: Not on file  . Transportation needs    Medical: Not on file    Non-medical: Not on file  Tobacco Use  . Smoking status: Former Smoker    Packs/day: 1.00    Quit date: 06/26/1986    Years since quitting: 32.5  . Smokeless tobacco: Never Used  Substance and Sexual Activity  . Alcohol use: No  . Drug use: No  . Sexual activity: Not Currently  Lifestyle  . Physical activity    Days per week: Not on file    Minutes per session: Not on file  . Stress: Not on file  Relationships  . Social Herbalist on phone: Not on file    Gets together: Not on file    Attends religious service: Not on file    Active member of club or organization: Not on file    Attends meetings of clubs or organizations: Not on file    Relationship status: Not on file  . Intimate partner violence    Fear of current or ex partner: Not on file    Emotionally abused: Not on file    Physically abused: Not on file    Forced sexual activity: Not on file  Other Topics Concern  . Not on file  Social History Narrative   Works part time at [employer]   Married 1979   2 daughters from prev relationship   [employer] fan, [employer] fan.     Allergies  Allergen Reactions  . Celebrex [Celecoxib]     REACTION: nausea: GI UPSET  . Doxycycline Other (See Comments)    GI upset- not an allergy.    . Gold-Containing Drug Products   . Nickel     Skin rash, allergy  . Other     Fragrance, phenylenediamine, contrast metal agents, gold   . Sodium Metabisulfite Other (See Comments)    Positive patch test  . Zocor [Simvastatin]     Aches but  tolerates crestor     Outpatient Medications Prior to Visit  Medication Sig Dispense Refill  . Cholecalciferol (VITAMIN D3) 50 MCG (2000 UT) capsule Take 1 capsule (2,000 Units total) by mouth daily. 90 capsule 3  . diltiazem (CARDIZEM) 30 MG tablet Take 1 tablet (30 mg total) by mouth 3 (three) times daily as needed. 90 tablet 1  . diphenhydrAMINE (BENADRYL) 25 MG tablet Take 25 mg by mouth every 8 (eight) hours as needed for itching.    . levothyroxine (SYNTHROID) 150 MCG tablet Take 1 tablet (150 mcg total) by mouth daily. Except for 1.5 on Sunday 100 tablet 3  . loperamide (IMODIUM A-D) 2 MG tablet Take 2 mg by mouth 2 (two) times daily as needed for diarrhea or loose stools.    Marland Kitchen omeprazole (PRILOSEC) 20 MG capsule Take 1 capsule (20 mg total) by mouth daily. 90 capsule 3  . promethazine (PHENERGAN) 25 MG tablet Take 1 tablet (25 mg total) by mouth every 6 (six) hours as needed. for nausea 30 tablet 3  . propranolol ER (INDERAL LA) 120 MG 24 hr capsule Take 1 capsule (120 mg total) by mouth daily. 90 capsule 3  . rivaroxaban (XARELTO) 20 MG TABS tablet TAKE 1 TABLET BY MOUTH ONCE DAILY WITH  SUPPER 90 tablet 3  . rosuvastatin (CRESTOR) 5 MG tablet Take 1 tablet (5 mg total) by mouth at bedtime. 90 tablet 2  . traMADol (ULTRAM) 50 MG tablet TAKE 1 TABLET BY MOUTH EVERY 8 HOURS AS NEEDED...CAUTION SEDATION 30 tablet 2   No facility-administered medications prior to visit.     Review of Systems  Constitutional: Negative for chills, fever, malaise/fatigue and weight loss.  HENT: Negative for hearing loss, sore throat and tinnitus.   Eyes: Negative for blurred vision and double vision.  Respiratory: Positive for shortness of breath. Negative for cough, hemoptysis, sputum production, wheezing and stridor.   Cardiovascular: Negative for chest pain, palpitations, orthopnea, leg swelling  and PND.  Gastrointestinal: Negative for abdominal pain, constipation, diarrhea, heartburn, nausea and vomiting.  Genitourinary: Negative for dysuria, hematuria and urgency.  Musculoskeletal: Negative for joint pain and myalgias.  Skin: Negative for itching and rash.  Neurological: Negative for dizziness, tingling, weakness and headaches.  Endo/Heme/Allergies: Negative for environmental allergies. Does not bruise/bleed easily.  Psychiatric/Behavioral: Negative for depression. The patient is not nervous/anxious and does not have insomnia.   All other systems reviewed and are negative.    Objective:  Physical Exam Vitals signs reviewed.  Constitutional:      General: She is not in acute distress.    Appearance: She is well-developed.     Comments: Obese female.  HENT:     Head: Normocephalic and atraumatic.  Eyes:     General: No scleral icterus.    Conjunctiva/sclera: Conjunctivae normal.     Pupils: Pupils are equal, round, and reactive to light.  Neck:     Musculoskeletal: Neck supple.     Vascular: No JVD.     Trachea: No tracheal deviation.  Cardiovascular:     Rate and Rhythm: Normal rate and regular rhythm.     Heart sounds: Normal heart sounds. No murmur.  Pulmonary:     Effort: Pulmonary effort is normal. No tachypnea, accessory muscle usage or respiratory distress.     Breath sounds: Normal breath sounds. No stridor. No wheezing, rhonchi or rales.  Abdominal:     General: Bowel sounds are normal. There is no distension.     Palpations: Abdomen is  soft.     Tenderness: There is no abdominal tenderness.  Musculoskeletal:        General: No tenderness.     Right lower leg: Edema present.     Left lower leg: Edema present.  Lymphadenopathy:     Cervical: No cervical adenopathy.  Skin:    General: Skin is warm and dry.     Capillary Refill: Capillary refill takes less than 2 seconds.     Findings: No rash.  Neurological:     Mental Status: She is alert and  oriented to person, place, and time.  Psychiatric:        Behavior: Behavior normal.      Vitals:   12/18/18 1459  BP: 128/74  Pulse: (!) 54  SpO2: 96%  Weight: 238 lb 3.2 oz (108 kg)  Height: 5' 4.5" (1.638 m)   96% on RA BMI Readings from Last 3 Encounters:  12/18/18 40.26 kg/m  11/20/18 40.68 kg/m  08/15/18 40.49 kg/m   Wt Readings from Last 3 Encounters:  12/18/18 238 lb 3.2 oz (108 kg)  11/20/18 237 lb (107.5 kg)  08/15/18 235 lb 14.4 oz (107 kg)     CBC    Component Value Date/Time   WBC 6.4 08/08/2018 1247   RBC 5.10 08/08/2018 1247   HGB 13.9 08/08/2018 1247   HGB 13.8 08/26/2011 1334   HCT 42.5 08/08/2018 1247   HCT 41.2 08/26/2011 1334   PLT 155.0 08/08/2018 1247   PLT 156 08/26/2011 1334   MCV 83.4 08/08/2018 1247   MCV 83 08/26/2011 1334   MCH 27.1 10/28/2016 0246   MCHC 32.7 08/08/2018 1247   RDW 14.5 08/08/2018 1247   RDW 13.8 08/26/2011 1334   LYMPHSABS 2.0 08/08/2018 1247   MONOABS 0.6 08/08/2018 1247   EOSABS 0.1 08/08/2018 1247   BASOSABS 0.0 08/08/2018 1247    Chest Imaging: 07/29/2018 CTA chest 10 mm right lower lobe pulmonary nodule, 5 mm right upper lobe pulmonary nodule, left upper lobe pulmonary nodule The patient's images have been independently reviewed by me.    June 2020 CT chest: Resolved pulmonary nodule as compared to previous.  New upper lobe pulmonary nodule.  Recommending 3-month follow-up. The patient's images have been independently reviewed by me.    Pulmonary Functions Testing Results: PFT Results Latest Ref Rng & Units 12/18/2018  FVC-Pre L 1.78  FVC-Predicted Pre % 59  FVC-Post L 1.81  FVC-Predicted Post % 60  Pre FEV1/FVC % % 77  Post FEV1/FCV % % 75  FEV1-Pre L 1.37  FEV1-Predicted Pre % 61  FEV1-Post L 1.36  DLCO UNC% % 74  DLCO COR %Predicted % 98  TLC L 4.26  TLC % Predicted % 83  RV % Predicted % 106    FeNO: None   Pathology: None   Echocardiogram:  10/27/2016: Normal ejection fraction 60  to 65%, mild LVH. 08/16/2018: 1. The left ventricle has normal systolic function, with an ejection fraction of 55-60%. The cavity size was normal. Left ventricular diastolic parameters were normal.  2. The right ventricle has normal systolic function. The cavity was normal. There is no increase in right ventricular wall thickness.  3. Left atrial size was moderately dilated.  4. Right atrial size was mildly dilated.  5. The mitral valve is degenerative. Moderate thickening of the mitral valve leaflet. Mild calcification of the mitral valve leaflet.  6. The tricuspid valve is normal in structure.  7. The aortic valve is tricuspid Moderate thickening of  the aortic valve Moderate sclerosis of the aortic valve.  Heart Catheterization: None     Assessment & Plan:     ICD-10-CM   1. Multiple pulmonary nodules  R91.8   2. Right lower lobe pulmonary nodule  R91.1   3. Right upper lobe pulmonary nodule  R91.1   4. Left lower lobe pulmonary nodule  R91.1   5. Former smoker  Z87.891   57. Abnormal findings on diagnostic imaging of lung  R91.8 CT Chest Wo Contrast  7. DOE (dyspnea on exertion)  R06.09 albuterol (VENTOLIN HFA) 108 (90 Base) MCG/ACT inhaler   Discussion:  73 year old female with a past medical history of multiple pulmonary nodules seen on the CT imaging of the chest.  She had follow-up imaging in June 2020 which revealed resolution of some of these nodules and new nodules in different places.  At this point will recommend repeat noncontrasted CT of the chest to be completed in 12 months.  She does have a smoking history which at least place her at moderate risk.  Additionally for her dyspnea on exertion.  She does not have any overt obstructive defect on her pulmonary function test.  She does have evidence of restriction.  Likely prism pattern, preserved ratio impaired spirometry likely related to obesity, BMI of 40, ERV of 10% predicted.  Patient was counseled today on weight loss  and activity.  She also has not established care with a sleep physician for sleep consultation.  I do believe she is at moderate risk for having sleep apnea due to her medical comorbidities and weight. We will schedule appointment today to see 1 of our colleagues in clinic.  Patient to return to clinic in 12 months following CT scan of the chest to review lung nodules.  Greater than 50% of this patient's 15-minute office was been face-to-face discussing the recommendation treatment plan as well as reviewed CT images as described above.   Current Outpatient Medications:  .  Cholecalciferol (VITAMIN D3) 50 MCG (2000 UT) capsule, Take 1 capsule (2,000 Units total) by mouth daily., Disp: 90 capsule, Rfl: 3 .  diltiazem (CARDIZEM) 30 MG tablet, Take 1 tablet (30 mg total) by mouth 3 (three) times daily as needed., Disp: 90 tablet, Rfl: 1 .  diphenhydrAMINE (BENADRYL) 25 MG tablet, Take 25 mg by mouth every 8 (eight) hours as needed for itching., Disp: , Rfl:  .  levothyroxine (SYNTHROID) 150 MCG tablet, Take 1 tablet (150 mcg total) by mouth daily. Except for 1.5 on Sunday, Disp: 100 tablet, Rfl: 3 .  loperamide (IMODIUM A-D) 2 MG tablet, Take 2 mg by mouth 2 (two) times daily as needed for diarrhea or loose stools., Disp: , Rfl:  .  omeprazole (PRILOSEC) 20 MG capsule, Take 1 capsule (20 mg total) by mouth daily., Disp: 90 capsule, Rfl: 3 .  promethazine (PHENERGAN) 25 MG tablet, Take 1 tablet (25 mg total) by mouth every 6 (six) hours as needed. for nausea, Disp: 30 tablet, Rfl: 3 .  propranolol ER (INDERAL LA) 120 MG 24 hr capsule, Take 1 capsule (120 mg total) by mouth daily., Disp: 90 capsule, Rfl: 3 .  rivaroxaban (XARELTO) 20 MG TABS tablet, TAKE 1 TABLET BY MOUTH ONCE DAILY WITH  SUPPER, Disp: 90 tablet, Rfl: 3 .  rosuvastatin (CRESTOR) 5 MG tablet, Take 1 tablet (5 mg total) by mouth at bedtime., Disp: 90 tablet, Rfl: 2 .  traMADol (ULTRAM) 50 MG tablet, TAKE 1 TABLET BY MOUTH EVERY 8 HOURS  AS  NEEDED...CAUTION SEDATION, Disp: 30 tablet, Rfl: 2 .  albuterol (VENTOLIN HFA) 108 (90 Base) MCG/ACT inhaler, Inhale 2 puffs into the lungs every 6 (six) hours as needed for wheezing or shortness of breath., Disp: 8 g, Rfl: 3   Garner Nash, DO [facility] 12/18/2018 3:20 PM

## 2018-12-18 NOTE — Patient Instructions (Addendum)
Thank you for visiting Dr. Valeta Harms at [facility]. Today we recommend the following: Orders Placed This Encounter  Procedures  . CT Chest Wo Contrast   Meds ordered this encounter  Medications  . albuterol (VENTOLIN HFA) 108 (90 Base) MCG/ACT inhaler    Sig: Inhale 2 puffs into the lungs every 6 (six) hours as needed for wheezing or shortness of breath.    Dispense:  8 g    Refill:  3   Please set patient up with Dr. Ander Slade or [facility] for next available sleep consultation.  Return in about 1 year (around 12/18/2019), or if symptoms worsen or fail to improve, for After CT imaging of the chest is complete. Marland Kitchen

## 2018-12-30 ENCOUNTER — Ambulatory Visit: Payer: PPO | Admitting: Pulmonary Disease

## 2018-12-30 ENCOUNTER — Encounter: Payer: Self-pay | Admitting: Pulmonary Disease

## 2018-12-30 ENCOUNTER — Other Ambulatory Visit: Payer: Self-pay

## 2018-12-30 VITALS — BP 140/72 | HR 73 | Temp 97.9°F | Ht 64.5 in | Wt 238.6 lb

## 2018-12-30 DIAGNOSIS — G4733 Obstructive sleep apnea (adult) (pediatric): Secondary | ICD-10-CM

## 2018-12-30 DIAGNOSIS — J9601 Acute respiratory failure with hypoxia: Secondary | ICD-10-CM | POA: Diagnosis not present

## 2018-12-30 DIAGNOSIS — I25118 Atherosclerotic heart disease of native coronary artery with other forms of angina pectoris: Secondary | ICD-10-CM

## 2018-12-30 NOTE — Patient Instructions (Signed)
High probability of significant obstructive sleep apnea  We will order home sleep study We will give you a call once the study is reviewed Treatment options as discussed  I will see you back in the office in about 3 months

## 2018-12-30 NOTE — Progress Notes (Signed)
Subjective:    Patient ID: Kara Wright, female    DOB: 23-Jan-1946, 73 y.o.   MRN: [id number]  Patient seen with a history of snoring  History of snoring No witnessed apneas Wakes up multiple times during the night Admits to dryness of her mouth in the mornings Admits to memory issues Denies chronic headaches About 15 pound weight gain recently  Usually goes to bed between 11 and 12 midnight Able to fall asleep in about 30 minutes Wakes up 2-3 times a night to use the bathroom Final awakening time about 9 to 9:30 AM  No family history of obstructive sleep apnea She does have a history of atrial fibrillation     Review of Systems  Constitutional: Negative for fever and unexpected weight change.  HENT: Negative for congestion, dental problem, ear pain, nosebleeds, postnasal drip, rhinorrhea, sinus pressure, sneezing, sore throat and trouble swallowing.   Eyes: Positive for itching. Negative for redness.  Respiratory: Negative for cough, chest tightness, shortness of breath and wheezing.   Cardiovascular: Negative for palpitations and leg swelling.  Gastrointestinal: Negative for nausea and vomiting.  Genitourinary: Negative for dysuria.  Musculoskeletal: Negative for joint swelling.  Skin: Negative for rash.  Allergic/Immunologic: Positive for environmental allergies. Negative for food allergies and immunocompromised state.  Neurological: Negative for headaches.  Hematological: Bruises/bleeds easily.  Psychiatric/Behavioral: Negative for dysphoric mood. The patient is not nervous/anxious.    Past Medical History:  Diagnosis Date  . Atrial fib/flutter, transient    1 episode, resolved, never on coumadin  . Cataract 2019   left eye  . Clotting disorder ([place])    on xarelto  . Diverticulosis 03/1997  . Dysrhythmia    atrial fibrilation  . GERD (gastroesophageal reflux disease) 1997   good on prilosec  . Hepatitis    hx of hep B (late 1970s)  . Hyperlipidemia  08/1996  . Hypothyroidism 1984  . Migraine    w/o aura  . Osteopenia 11/2002   via dexa  . Presence of dental prosthetic device    temporary while waiting for implant  . Skin cancer    skin cancer  . Stroke [facility]) 08/2011   TIA   Social History   Socioeconomic History  . Marital status: Married    Spouse name: Not on file  . Number of children: 2  . Years of education: Not on file  . Highest education level: Not on file  Occupational History  . Occupation: Ecologist: RETIRED    Comment: [employer]  . 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 Financial resource strain: Not on file  . Food insecurity    Worry: Not on file    Inability: Not on file  . Transportation needs    Medical: Not on file    Non-medical: Not on file  Tobacco Use  . Smoking status: Former Smoker    Packs/day: 1.00    Quit date: 06/26/1986    Years since quitting: 32.5  . Smokeless tobacco: Never Used  Substance and Sexual Activity  . Alcohol use: No  . Drug use: No  . Sexual activity: Not Currently  Lifestyle  . Physical activity    Days per week: Not on file    Minutes per session: Not on file  . Stress: Not on file  Relationships  . Social Herbalist on phone: Not on file    Gets  together: Not on file    Attends religious service: Not on file    Active member of club or organization: Not on file    Attends meetings of clubs or organizations: Not on file    Relationship status: Not on file  . Intimate partner violence    Fear of current or ex partner: Not on file    Emotionally abused: Not on file    Physically abused: Not on file    Forced sexual activity: Not on file  Other Topics Concern  . Not on file  Social History Narrative   Works part time at [employer] office   Married 1979   2 daughters from prev relationship   [employer] fan, [employer] fan.   Family History  Problem Relation Age of Onset  .  Hypertension Mother   . Osteopenia Mother   . Stroke Mother        deceased age 25  . Colon cancer Father 60       mets  . Allergies Sister   . Breast cancer Cousin        pat cousin  . Rectal cancer Neg Hx       Objective:   Physical Exam Constitutional:      Appearance: Normal appearance.  HENT:     Head: Normocephalic and atraumatic.  Eyes:     Extraocular Movements: Extraocular movements intact.     Pupils: Pupils are equal, round, and reactive to light.  Neck:     Musculoskeletal: Normal range of motion and neck supple. No neck rigidity.  Cardiovascular:     Rate and Rhythm: Normal rate and regular rhythm.  Pulmonary:     Effort: Pulmonary effort is normal. No respiratory distress.     Breath sounds: Normal breath sounds. No stridor. No wheezing.  Abdominal:     General: There is no distension.     Palpations: Abdomen is soft. There is no mass.  Musculoskeletal: Normal range of motion.        General: No swelling or tenderness.  Skin:    General: Skin is warm.     Coloration: Skin is not jaundiced.  Neurological:     Mental Status: She is alert.  Psychiatric:        Mood and Affect: Mood normal.    Results of the Epworth flowsheet 12/30/2018  Sitting and reading 3  Watching TV 3  Sitting, inactive in a public place (e.g. a theatre or a meeting) 1  As a passenger in a car for an hour without a break 1  Lying down to rest in the afternoon when circumstances permit 1  Sitting and talking to someone 0  Sitting quietly after a lunch without alcohol 1  In a car, while stopped for a few minutes in traffic 0  Total score 10   Vitals:   12/30/18 1101  BP: 140/72  Pulse: 73  Temp: 97.9 F (36.6 C)  SpO2: 95%      Assessment & Plan:  .  High probability of significant obstructive sleep apnea  .  Morbid obesity  .  History of coronary artery disease/atrial fibrillation  .  History of hypothyroidism  Plan: .  Pathophysiology of obstructive sleep apnea  discussed with the patient .  Options of treatment discussed with the patient .  Importance of weight loss and regular exercise discussed .  Optimization of sleep hygiene, getting enough hours of sleep discussed .  Will schedule the patient for a  home sleep study .  I will see her back in about 3 months . Encouraged to call if any significant concerns

## 2018-12-31 ENCOUNTER — Other Ambulatory Visit: Payer: Self-pay | Admitting: Family Medicine

## 2019-01-17 ENCOUNTER — Ambulatory Visit: Payer: PPO

## 2019-01-17 ENCOUNTER — Other Ambulatory Visit: Payer: Self-pay

## 2019-01-17 DIAGNOSIS — G4733 Obstructive sleep apnea (adult) (pediatric): Secondary | ICD-10-CM

## 2019-01-22 ENCOUNTER — Other Ambulatory Visit: Payer: Self-pay | Admitting: Family Medicine

## 2019-01-22 DIAGNOSIS — Z1231 Encounter for screening mammogram for malignant neoplasm of breast: Secondary | ICD-10-CM

## 2019-01-28 DIAGNOSIS — G4733 Obstructive sleep apnea (adult) (pediatric): Secondary | ICD-10-CM | POA: Diagnosis not present

## 2019-02-03 ENCOUNTER — Other Ambulatory Visit: Payer: Self-pay

## 2019-02-03 MED ORDER — ROSUVASTATIN CALCIUM 5 MG PO TABS: 5.0000 mg | ORAL_TABLET | Freq: Every day | ORAL | 2 refills | Status: DC

## 2019-02-14 ENCOUNTER — Other Ambulatory Visit: Payer: Self-pay | Admitting: Family Medicine

## 2019-02-14 NOTE — Telephone Encounter (Signed)
Electronic refill request. Xarelto Last office visit:   11/20/2018 Acute Last Filled:

## 2019-02-17 ENCOUNTER — Ambulatory Visit: Payer: PPO

## 2019-02-19 ENCOUNTER — Telehealth: Payer: Self-pay | Admitting: Pulmonary Disease

## 2019-02-19 DIAGNOSIS — G4733 Obstructive sleep apnea (adult) (pediatric): Secondary | ICD-10-CM

## 2019-02-19 NOTE — Telephone Encounter (Signed)
Patient may be started on auto titrating CPAP 5-18  Follow-up in clinic with compliance monitoring after about 4 to 6 weeks of using CPAP

## 2019-02-19 NOTE — Telephone Encounter (Signed)
Dr. Ander Slade  Pt made aware she does have severe OSA. Recommendation is to have titration study. Would you like order submitted or are you placing her on auto-cpap?

## 2019-02-19 NOTE — Telephone Encounter (Signed)
Contacted patient to review Dr. Judson Roch recommendations for cpap.  Patient acknowledged she is aware she has sleep apnea but unsure if she wants to proceed with Cpap therapy.  Patient concerned about her cost and if she can tolerate cpap.  Discussed with patient that the DME company could discuss her costs related to what her insurance covers and also they could show her equipment options, etc. Also, informed patient that we can place the order and she can meet with DME company to talk about this but she still has option to decline the cpap after the meeting with them.    Patient is agreeable for [place] to place cpap order.  Patient already has an appt acheduled for Mar 31, 2019 for follow up with Dr. Ander Slade. Advised patient she would need to be on the cpap at least 31 days prior to her compliance visit and to make a note of when she begins cpap therapy in order to meet compliance check.  Also placed reminder in the appt note that patient needs a cpap compliance check at her 3 month October visit.  Advised patient we could move the appt out if needed.   Order for cpap placed.  Nothing further needed at this time.

## 2019-03-04 ENCOUNTER — Other Ambulatory Visit: Payer: Self-pay | Admitting: Family Medicine

## 2019-03-04 NOTE — Telephone Encounter (Signed)
Electronic refill request. Promethazine Last office visit:   09/18/2018 Virtual Last Filled:    30 tablet 3 10/23/2018  Please advise.

## 2019-03-05 ENCOUNTER — Ambulatory Visit
Admission: RE | Admit: 2019-03-05 | Discharge: 2019-03-05 | Disposition: A | Discharge status: Home / Self Care | Payer: PPO | Source: Ambulatory Visit | Attending: Family Medicine | Admitting: Family Medicine

## 2019-03-05 ENCOUNTER — Other Ambulatory Visit: Payer: Self-pay

## 2019-03-05 DIAGNOSIS — Z1231 Encounter for screening mammogram for malignant neoplasm of breast: Secondary | ICD-10-CM

## 2019-03-05 NOTE — Telephone Encounter (Signed)
Sent. Thanks.   

## 2019-03-21 ENCOUNTER — Ambulatory Visit (INDEPENDENT_AMBULATORY_CARE_PROVIDER_SITE_OTHER): Payer: PPO

## 2019-03-21 DIAGNOSIS — Z23 Encounter for immunization: Secondary | ICD-10-CM | POA: Diagnosis not present

## 2019-03-24 DIAGNOSIS — L299 Pruritus, unspecified: Secondary | ICD-10-CM | POA: Diagnosis not present

## 2019-03-24 DIAGNOSIS — L308 Other specified dermatitis: Secondary | ICD-10-CM | POA: Diagnosis not present

## 2019-03-28 ENCOUNTER — Telehealth: Payer: Self-pay | Admitting: Pulmonary Disease

## 2019-03-28 NOTE — Telephone Encounter (Signed)
Called and spoke w/ pt. Pt would like to know if she could receive the covid antibody blood test. She states at the end of March 2020 and through the first of April 2020, she was diagnosed with the flu by [facility]; however, 3-4 days later, her spouse developed symptoms and was hospitalized, where he was later tested positive for COVID-19. Pt is inquiring the following,  1. If there is a chance she has antibodies, how long they would last? 2. If she has antibodies, could she help someone by donating blood?    Since AO is not currently in the office, I am routing this message to the provider of the day. TP, please advise if we can place an order for covid antibody test and advise with your recommendations on pt's questions. Thank you.

## 2019-03-28 NOTE — Telephone Encounter (Signed)
Those are all good questions.  What our experience has been if she had it several months ago it is likely that the antibody test would not be positive.  Our experiences showed that after several weeks the antibody test becomes negative again. Can discuss further at her follow-up visit.

## 2019-03-28 NOTE — Telephone Encounter (Signed)
Called and spoke w/ pt regarding TP's recommendations. Pt verbalized understanding with no additional questions. Pt has an appt to see AO this Monday 03/31/2019. Nothing further needed at this time.

## 2019-03-31 ENCOUNTER — Other Ambulatory Visit: Payer: Self-pay

## 2019-03-31 ENCOUNTER — Ambulatory Visit: Payer: PPO | Admitting: Pulmonary Disease

## 2019-03-31 ENCOUNTER — Encounter: Payer: Self-pay | Admitting: Pulmonary Disease

## 2019-03-31 VITALS — BP 120/76 | HR 60 | Temp 97.2°F | Ht 64.0 in | Wt 239.8 lb

## 2019-03-31 DIAGNOSIS — G4733 Obstructive sleep apnea (adult) (pediatric): Secondary | ICD-10-CM | POA: Diagnosis not present

## 2019-03-31 NOTE — Progress Notes (Signed)
Subjective:    Patient ID: Kara Wright, female    DOB: May 19, 1946, 73 y.o.   MRN: [id number]  Patient seen with a history of snoring  She had recently had home sleep study which revealed severe obstructive sleep apnea with oxygen desaturations  Discussed an in lab titration study Issues with cost and affordability  We decided on auto titrating CPAP with regular follow-up  She is admits to feeling tired upon awakening   History: History of snoring No witnessed apneas Wakes up multiple times during the night Admits to dryness of her mouth in the mornings Admits to memory issues Denies chronic headaches About 15 pound weight gain recently  Usually goes to bed between 11 and 12 midnight Able to fall asleep in about 30 minutes Wakes up 2-3 times a night to use the bathroom Final awakening time about 9 to 9:30 AM  No family history of obstructive sleep apnea She does have a history of atrial fibrillation     Review of Systems  Constitutional: Negative for fever and unexpected weight change.  HENT: Negative for congestion, dental problem, ear pain, nosebleeds, postnasal drip, rhinorrhea, sinus pressure, sneezing, sore throat and trouble swallowing.   Eyes: Negative for redness and itching.  Respiratory: Positive for apnea. Negative for cough, chest tightness, shortness of breath and wheezing.   Cardiovascular: Negative for palpitations and leg swelling.  Gastrointestinal: Negative for nausea and vomiting.  Genitourinary: Negative for dysuria.  Musculoskeletal: Negative for joint swelling.  Skin: Negative for rash.  Allergic/Immunologic: Negative for environmental allergies, food allergies and immunocompromised state.  Neurological: Negative for headaches.  Hematological: Bruises/bleeds easily.  Psychiatric/Behavioral: Positive for sleep disturbance. Negative for dysphoric mood. The patient is not nervous/anxious.    Past Medical History:  Diagnosis Date  . Atrial  fib/flutter, transient    1 episode, resolved, never on coumadin  . Cataract 2019   left eye  . Clotting disorder ([place])    on xarelto  . Diverticulosis 03/1997  . Dysrhythmia    atrial fibrilation  . GERD (gastroesophageal reflux disease) 1997   good on prilosec  . Hepatitis    hx of hep B (late 1970s)  . Hyperlipidemia 08/1996  . Hypothyroidism 1984  . Migraine    w/o aura  . Osteopenia 11/2002   via dexa  . Presence of dental prosthetic device    temporary while waiting for implant  . Skin cancer    skin cancer  . Stroke [facility]) 08/2011   TIA   Social History   Socioeconomic History  . Marital status: Married    Spouse name: Not on file  . Number of children: 2  . Years of education: Not on file  . Highest education level: Not on file  Occupational History  . Occupation: Ecologist: RETIRED    Comment: [employer]  . 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 Financial resource strain: Not on file  . Food insecurity    Worry: Not on file    Inability: Not on file  . Transportation needs    Medical: Not on file    Non-medical: Not on file  Tobacco Use  . Smoking status: Former Smoker    Packs/day: 1.00    Quit date: 06/26/1986    Years since quitting: 32.7  . Smokeless tobacco: Never Used  Substance and Sexual Activity  . Alcohol use: No  . Drug use: No  .  Sexual activity: Not Currently  Lifestyle  . Physical activity    Days per week: Not on file    Minutes per session: Not on file  . Stress: Not on file  Relationships  . Social Herbalist on phone: Not on file    Gets together: Not on file    Attends religious service: Not on file    Active member of club or organization: Not on file    Attends meetings of clubs or organizations: Not on file    Relationship status: Not on file  . Intimate partner violence    Fear of current or ex partner: Not on file    Emotionally abused: Not on  file    Physically abused: Not on file    Forced sexual activity: Not on file  Other Topics Concern  . Not on file  Social History Narrative   Works part time at [employer] office   Married 1979   2 daughters from prev relationship   [employer] fan, [employer] fan.   Family History  Problem Relation Age of Onset  . Hypertension Mother   . Osteopenia Mother   . Stroke Mother        deceased age 69  . Colon cancer Father 60       mets  . Allergies Sister   . Breast cancer Cousin        pat cousin  . Rectal cancer Neg Hx       Objective:   Physical Exam Constitutional:      Appearance: Normal appearance.  HENT:     Head: Normocephalic and atraumatic.     Mouth/Throat:     Mouth: Mucous membranes are moist.  Eyes:     Extraocular Movements: Extraocular movements intact.     Pupils: Pupils are equal, round, and reactive to light.  Neck:     Musculoskeletal: Normal range of motion and neck supple. No neck rigidity.  Cardiovascular:     Rate and Rhythm: Normal rate and regular rhythm.  Pulmonary:     Effort: Pulmonary effort is normal. No respiratory distress.     Breath sounds: Normal breath sounds. No stridor. No wheezing.  Abdominal:     General: There is no distension.     Palpations: Abdomen is soft. There is no mass.  Skin:    Coloration: Skin is not jaundiced.  Neurological:     Mental Status: She is alert.    Results of the Epworth flowsheet 12/30/2018  Sitting and reading 3  Watching TV 3  Sitting, inactive in a public place (e.g. a theatre or a meeting) 1  As a passenger in a car for an hour without a break 1  Lying down to rest in the afternoon when circumstances permit 1  Sitting and talking to someone 0  Sitting quietly after a lunch without alcohol 1  In a car, while stopped for a few minutes in traffic 0  Total score 10   Vitals:   03/31/19 1338  BP: 120/76  Pulse: 60  Temp: (!) 97.2 F (36.2 C)  SpO2: 93%      Assessment & Plan:  .  Severe  obstructive sleep apnea -Sleep study was reviewed with the patient -This may be contributing to her daytime sleepiness and tiredness  .  Morbid obesity Encouraged to continue working on weight loss efforts  .  History of coronary artery disease/atrial fibrillation -Symptoms appear to be well controlled at present  .  History of hypothyroidism  Plan: .  Pathophysiology of obstructive sleep apnea discussed with the patient .  Options of treatment reviewed and we will initiate auto titrating CPAP 5-18 with monitoring of compliance data and an acute nocturnal oximetry will be performed as soon as CPAP is set up .  Importance of weight loss and regular exercise discussed  .  Optimization of sleep hygiene discussed .  Importance of getting enough hours of sleep discussed   .  I will see her back in about 3 months . Encouraged to call if any significant concerns

## 2019-03-31 NOTE — Patient Instructions (Addendum)
Severe obstructive sleep apnea  DME referral to set up CPAP  Auto titrating CPAP 5-18 Nocturnal oximetry with CPAP in place a few days after CPAP is set up  Compliance data in about 2 weeks following CPAP initiation  I will see you in about 2 months

## 2019-04-11 DIAGNOSIS — G4733 Obstructive sleep apnea (adult) (pediatric): Secondary | ICD-10-CM | POA: Diagnosis not present

## 2019-04-28 DIAGNOSIS — X32XXXA Exposure to sunlight, initial encounter: Secondary | ICD-10-CM | POA: Diagnosis not present

## 2019-04-28 DIAGNOSIS — L570 Actinic keratosis: Secondary | ICD-10-CM | POA: Diagnosis not present

## 2019-04-28 DIAGNOSIS — D2271 Melanocytic nevi of right lower limb, including hip: Secondary | ICD-10-CM | POA: Diagnosis not present

## 2019-04-28 DIAGNOSIS — D2261 Melanocytic nevi of right upper limb, including shoulder: Secondary | ICD-10-CM | POA: Diagnosis not present

## 2019-04-28 DIAGNOSIS — D2262 Melanocytic nevi of left upper limb, including shoulder: Secondary | ICD-10-CM | POA: Diagnosis not present

## 2019-04-28 DIAGNOSIS — Z85828 Personal history of other malignant neoplasm of skin: Secondary | ICD-10-CM | POA: Diagnosis not present

## 2019-04-28 DIAGNOSIS — L239 Allergic contact dermatitis, unspecified cause: Secondary | ICD-10-CM | POA: Diagnosis not present

## 2019-04-28 DIAGNOSIS — L821 Other seborrheic keratosis: Secondary | ICD-10-CM | POA: Diagnosis not present

## 2019-05-12 DIAGNOSIS — G4733 Obstructive sleep apnea (adult) (pediatric): Secondary | ICD-10-CM | POA: Diagnosis not present

## 2019-05-13 ENCOUNTER — Telehealth: Payer: Self-pay | Admitting: Family Medicine

## 2019-05-13 DIAGNOSIS — E039 Hypothyroidism, unspecified: Secondary | ICD-10-CM

## 2019-05-13 NOTE — Telephone Encounter (Signed)
Notify patient.  Pharmacy is requesting a change in her brand of thyroid medication.  Pharmacy cannot get the previous brand she had.  I am okay with a one-time change.  Make sure patient is okay with this.  This is not a change in dose.  Would need recheck TSH 2 months after the change.  I put in the order.  Thanks.

## 2019-05-13 NOTE — Telephone Encounter (Signed)
Patient is ok with the change and says she has lab work already scheduled in February and an OV in early March which will be a bit longer than 2 months but will that be ok?

## 2019-05-14 NOTE — Telephone Encounter (Signed)
Per DPR, left detail message of Dr Josefine Class comments for patient.

## 2019-05-14 NOTE — Telephone Encounter (Signed)
That should be fine.  If she notices trouble in the meantime or has other concerns and please let me know.  Thanks.

## 2019-06-04 ENCOUNTER — Ambulatory Visit (INDEPENDENT_AMBULATORY_CARE_PROVIDER_SITE_OTHER): Payer: PPO | Admitting: Pulmonary Disease

## 2019-06-04 ENCOUNTER — Other Ambulatory Visit: Payer: Self-pay

## 2019-06-04 DIAGNOSIS — Z9989 Dependence on other enabling machines and devices: Secondary | ICD-10-CM

## 2019-06-04 DIAGNOSIS — G4733 Obstructive sleep apnea (adult) (pediatric): Secondary | ICD-10-CM

## 2019-06-04 NOTE — Patient Instructions (Addendum)
Use of Flonase/Nasonex-steroid spray may help sneezing  Continue CPAP use  Follow-up in 6 months

## 2019-06-04 NOTE — Progress Notes (Signed)
Virtual Visit via Telephone Note  I connected with [name] on 06/04/19 at  1:30 PM EST by telephone and verified that I am speaking with the correct person using two identifiers.  Location: Patient: Kara Wright Provider: Adrian Prince discussed the limitations, risks, security and privacy concerns of performing an evaluation and management service by telephone and the availability of in person appointments. I also discussed with the patient that there may be a patient responsible charge related to this service. The patient expressed understanding and agreed to proceed.   History of Present Illness: Recently started using CPAP Seen significant benefit in CPAP use She does have some nasal stuffiness, sneezing -We talked about using Nasonex or Flonase for nasal stuffiness and congestion  Recently had a mask change, continues to help  Symptoms are much better controlled   Observations/Objective: Sounds well on the phone Compliance data reveals 100% compliance with CPAP use, average use of 5 hours 20 minutes CPAP set between 5 and 18 Residual AHI of 3.4  Assessment and Plan: Obstructive sleep apnea -Adequately controlled on CPAP therapy   Follow Up Instructions:    I discussed the assessment and treatment plan with the patient. The patient was provided an opportunity to ask questions and all were answered. The patient agreed with the plan and demonstrated an understanding of the instructions.   The patient was advised to call back or seek an in-person evaluation if the symptoms worsen or if the condition fails to improve as anticipated.  I provided 12 minutes of non-face-to-face time during this encounter. Download reviewed  Follow-up in 6 months  Adewale Clearence Ped, MD

## 2019-06-11 DIAGNOSIS — G4733 Obstructive sleep apnea (adult) (pediatric): Secondary | ICD-10-CM | POA: Diagnosis not present

## 2019-07-11 DIAGNOSIS — G4733 Obstructive sleep apnea (adult) (pediatric): Secondary | ICD-10-CM | POA: Diagnosis not present

## 2019-07-23 ENCOUNTER — Ambulatory Visit: Payer: PPO | Admitting: Family

## 2019-08-10 ENCOUNTER — Other Ambulatory Visit: Payer: Self-pay | Admitting: Family Medicine

## 2019-08-10 DIAGNOSIS — E039 Hypothyroidism, unspecified: Secondary | ICD-10-CM

## 2019-08-10 DIAGNOSIS — I480 Paroxysmal atrial fibrillation: Secondary | ICD-10-CM

## 2019-08-10 DIAGNOSIS — R739 Hyperglycemia, unspecified: Secondary | ICD-10-CM

## 2019-08-10 DIAGNOSIS — E559 Vitamin D deficiency, unspecified: Secondary | ICD-10-CM

## 2019-08-10 DIAGNOSIS — E7849 Other hyperlipidemia: Secondary | ICD-10-CM

## 2019-08-13 ENCOUNTER — Other Ambulatory Visit: Payer: Self-pay

## 2019-08-13 ENCOUNTER — Ambulatory Visit: Payer: PPO

## 2019-08-13 ENCOUNTER — Telehealth: Payer: Self-pay

## 2019-08-13 ENCOUNTER — Other Ambulatory Visit (INDEPENDENT_AMBULATORY_CARE_PROVIDER_SITE_OTHER): Payer: PPO

## 2019-08-13 DIAGNOSIS — E559 Vitamin D deficiency, unspecified: Secondary | ICD-10-CM | POA: Diagnosis not present

## 2019-08-13 DIAGNOSIS — E039 Hypothyroidism, unspecified: Secondary | ICD-10-CM

## 2019-08-13 DIAGNOSIS — E7849 Other hyperlipidemia: Secondary | ICD-10-CM | POA: Diagnosis not present

## 2019-08-13 DIAGNOSIS — R739 Hyperglycemia, unspecified: Secondary | ICD-10-CM

## 2019-08-13 DIAGNOSIS — I480 Paroxysmal atrial fibrillation: Secondary | ICD-10-CM | POA: Diagnosis not present

## 2019-08-13 LAB — COMPREHENSIVE METABOLIC PANEL
ALT: 12 U/L (ref 0–35)
AST: 14 U/L (ref 0–37)
Albumin: 3.7 g/dL (ref 3.5–5.2)
Alkaline Phosphatase: 68 U/L (ref 39–117)
BUN: 7 mg/dL (ref 6–23)
CO2: 31 mEq/L (ref 19–32)
Calcium: 8.8 mg/dL (ref 8.4–10.5)
Chloride: 104 mEq/L (ref 96–112)
Creatinine, Ser: 0.65 mg/dL (ref 0.40–1.20)
GFR: 89.34 mL/min (ref >60.00)
Glucose, Bld: 109 mg/dL — ABNORMAL HIGH (ref 70–99)
Potassium: 3.9 mEq/L (ref 3.5–5.1)
Sodium: 142 mEq/L (ref 135–145)
Total Bilirubin: 0.7 mg/dL (ref 0.2–1.2)
Total Protein: 6.1 g/dL (ref 6.0–8.3)

## 2019-08-13 LAB — CBC WITH DIFFERENTIAL/PLATELET
Basophils Absolute: 0 10*3/uL (ref 0.0–0.1)
Basophils Relative: 0.5 % (ref 0.0–3.0)
Eosinophils Absolute: 0.1 10*3/uL (ref 0.0–0.7)
Eosinophils Relative: 2.5 % (ref 0.0–5.0)
HCT: 38.7 % (ref 36.0–46.0)
Hemoglobin: 12.5 g/dL (ref 12.0–15.0)
Lymphocytes Relative: 37.2 % (ref 12.0–46.0)
Lymphs Abs: 1.8 10*3/uL (ref 0.7–4.0)
MCHC: 32.2 g/dL (ref 30.0–36.0)
MCV: 81 fl (ref 78.0–100.0)
Monocytes Absolute: 0.4 10*3/uL (ref 0.1–1.0)
Monocytes Relative: 9.1 % (ref 3.0–12.0)
Neutro Abs: 2.4 10*3/uL (ref 1.4–7.7)
Neutrophils Relative %: 50.7 % (ref 43.0–77.0)
Platelets: 140 10*3/uL — ABNORMAL LOW (ref 150.0–400.0)
RBC: 4.78 Mil/uL (ref 3.87–5.11)
RDW: 15.1 % (ref 11.5–15.5)
WBC: 4.8 10*3/uL (ref 4.0–10.5)

## 2019-08-13 LAB — VITAMIN D 25 HYDROXY (VIT D DEFICIENCY, FRACTURES): VITD: 32.68 ng/mL (ref 30.00–100.00)

## 2019-08-13 LAB — LIPID PANEL
Cholesterol: 106 mg/dL (ref 0–200)
HDL: 38 mg/dL — ABNORMAL LOW (ref >39.00)
LDL Cholesterol: 44 mg/dL (ref 0–99)
NonHDL: 68.29
Total CHOL/HDL Ratio: 3
Triglycerides: 119 mg/dL (ref 0.0–149.0)
VLDL: 23.8 mg/dL (ref 0.0–40.0)

## 2019-08-13 LAB — TSH: TSH: 1.6 u[IU]/mL (ref 0.35–4.50)

## 2019-08-13 NOTE — Telephone Encounter (Signed)
Noted. Thanks.

## 2019-08-13 NOTE — Addendum Note (Signed)
Addended by: Ellamae Sia on: 08/13/2019 10:05 AM   Modules accepted: Orders

## 2019-08-13 NOTE — Telephone Encounter (Signed)
FYI-Called patient 4 times trying to complete her medicare visit. Patient never answered the phone. Voicemail box full and was unable to leave a message. Appointment for today has been cancelled.

## 2019-08-14 LAB — HEMOGLOBIN A1C
Hgb A1c MFr Bld: 5.5 % of total Hgb (ref <5.7)
Mean Plasma Glucose: 111 (calc)
eAG (mmol/L): 6.2 (calc)

## 2019-08-15 DIAGNOSIS — G4733 Obstructive sleep apnea (adult) (pediatric): Secondary | ICD-10-CM | POA: Diagnosis not present

## 2019-08-19 ENCOUNTER — Ambulatory Visit: Payer: PPO

## 2019-08-21 ENCOUNTER — Other Ambulatory Visit: Payer: Self-pay

## 2019-08-21 ENCOUNTER — Ambulatory Visit (INDEPENDENT_AMBULATORY_CARE_PROVIDER_SITE_OTHER): Payer: PPO

## 2019-08-21 DIAGNOSIS — Z0000 Encounter for general adult medical examination without abnormal findings: Secondary | ICD-10-CM

## 2019-08-21 NOTE — Patient Instructions (Signed)
[name] , Thank you for taking time to come for your Medicare Wellness Visit. I appreciate your ongoing commitment to your health goals. Please review the following plan we discussed and let me know if I can assist you in the future.   Screening recommendations/referrals: Colonoscopy: Up to date, completed 10/29/2017 Mammogram: Up to date, completed 03/05/2019 Bone Density: completed 10/16/2016 Recommended yearly ophthalmology/optometry visit for glaucoma screening and checkup Recommended yearly dental visit for hygiene and checkup  Vaccinations: Influenza vaccine: Up to date, completed 03/21/2019 Pneumococcal vaccine: Completed series Tdap vaccine: Up to date, completed 02/20/2012 Shingles vaccine: discussed    Advanced directives: Advance directive discussed with you today. Even though you declined this today please call our office should you change your mind and we can give you the proper paperwork for you to fill out.  Conditions/risks identified: hypertension, hyperlipidemia  Next appointment: 08/22/2019 @ 12 pm    Preventive Care 65 Years and Older, Female Preventive care refers to lifestyle choices and visits with your health care provider that can promote health and wellness. What does preventive care include?  A yearly physical exam. This is also called an annual well check.  Dental exams once or twice a year.  Routine eye exams. Ask your health care provider how often you should have your eyes checked.  Personal lifestyle choices, including:  Daily care of your teeth and gums.  Regular physical activity.  Eating a healthy diet.  Avoiding tobacco and drug use.  Limiting alcohol use.  Practicing safe sex.  Taking low-dose aspirin every day.  Taking vitamin and mineral supplements as recommended by your health care provider. What happens during an annual well check? The services and screenings done by your health care provider during your annual well check will  depend on your age, overall health, lifestyle risk factors, and family history of disease. Counseling  Your health care provider may ask you questions about your:  Alcohol use.  Tobacco use.  Drug use.  Emotional well-being.  Home and relationship well-being.  Sexual activity.  Eating habits.  History of falls.  Memory and ability to understand (cognition).  Work and work Statistician.  Reproductive health. Screening  You may have the following tests or measurements:  Height, weight, and BMI.  Blood pressure.  Lipid and cholesterol levels. These may be checked every 5 years, or more frequently if you are over 59 years old.  Skin check.  Lung cancer screening. You may have this screening every year starting at age 18 if you have a 30-pack-year history of smoking and currently smoke or have quit within the past 15 years.  Fecal occult blood test (FOBT) of the stool. You may have this test every year starting at age 73.  Flexible sigmoidoscopy or colonoscopy. You may have a sigmoidoscopy every 5 years or a colonoscopy every 10 years starting at age 38.  Hepatitis C blood test.  Hepatitis B blood test.  Sexually transmitted disease (STD) testing.  Diabetes screening. This is done by checking your blood sugar (glucose) after you have not eaten for a while (fasting). You may have this done every 1-3 years.  Bone density scan. This is done to screen for osteoporosis. You may have this done starting at age 42.  Mammogram. This may be done every 1-2 years. Talk to your health care provider about how often you should have regular mammograms. Talk with your health care provider about your test results, treatment options, and if necessary, the need for more  tests. Vaccines  Your health care provider may recommend certain vaccines, such as:  Influenza vaccine. This is recommended every year.  Tetanus, diphtheria, and acellular pertussis (Tdap, Td) vaccine. You may need a  Td booster every 10 years.  Zoster vaccine. You may need this after age 20.  Pneumococcal 13-valent conjugate (PCV13) vaccine. One dose is recommended after age 13.  Pneumococcal polysaccharide (PPSV23) vaccine. One dose is recommended after age 72. Talk to your health care provider about which screenings and vaccines you need and how often you need them. This information is not intended to replace advice given to you by your health care provider. Make sure you discuss any questions you have with your health care provider. Document Released: 07/02/2015 Document Revised: 02/23/2016 Document Reviewed: 04/06/2015 Elsevier Interactive Patient Education  2017 [place] Prevention in the Home Falls can cause injuries. They can happen to people of all ages. There are many things you can do to make your home safe and to help prevent falls. What can I do on the outside of my home?  Regularly fix the edges of walkways and driveways and fix any cracks.  Remove anything that might make you trip as you walk through a door, such as a raised step or threshold.  Trim any bushes or trees on the path to your home.  Use bright outdoor lighting.  Clear any walking paths of anything that might make someone trip, such as rocks or tools.  Regularly check to see if handrails are loose or broken. Make sure that both sides of any steps have handrails.  Any raised decks and porches should have guardrails on the edges.  Have any leaves, snow, or ice cleared regularly.  Use sand or salt on walking paths during winter.  Clean up any spills in your garage right away. This includes oil or grease spills. What can I do in the bathroom?  Use night lights.  Install grab bars by the toilet and in the tub and shower. Do not use towel bars as grab bars.  Use non-skid mats or decals in the tub or shower.  If you need to sit down in the shower, use a plastic, non-slip stool.  Keep the floor dry. Clean  up any water that spills on the floor as soon as it happens.  Remove soap buildup in the tub or shower regularly.  Attach bath mats securely with double-sided non-slip rug tape.  Do not have throw rugs and other things on the floor that can make you trip. What can I do in the bedroom?  Use night lights.  Make sure that you have a light by your bed that is easy to reach.  Do not use any sheets or blankets that are too big for your bed. They should not hang down onto the floor.  Have a firm chair that has side arms. You can use this for support while you get dressed.  Do not have throw rugs and other things on the floor that can make you trip. What can I do in the kitchen?  Clean up any spills right away.  Avoid walking on wet floors.  Keep items that you use a lot in easy-to-reach places.  If you need to reach something above you, use a strong step stool that has a grab bar.  Keep electrical cords out of the way.  Do not use floor polish or wax that makes floors slippery. If you must use wax, use non-skid floor  wax.  Do not have throw rugs and other things on the floor that can make you trip. What can I do with my stairs?  Do not leave any items on the stairs.  Make sure that there are handrails on both sides of the stairs and use them. Fix handrails that are broken or loose. Make sure that handrails are as long as the stairways.  Check any carpeting to make sure that it is firmly attached to the stairs. Fix any carpet that is loose or worn.  Avoid having throw rugs at the top or bottom of the stairs. If you do have throw rugs, attach them to the floor with carpet tape.  Make sure that you have a light switch at the top of the stairs and the bottom of the stairs. If you do not have them, ask someone to add them for you. What else can I do to help prevent falls?  Wear shoes that:  Do not have high heels.  Have rubber bottoms.  Are comfortable and fit you well.  Are  closed at the toe. Do not wear sandals.  If you use a stepladder:  Make sure that it is fully opened. Do not climb a closed stepladder.  Make sure that both sides of the stepladder are locked into place.  Ask someone to hold it for you, if possible.  Clearly mark and make sure that you can see:  Any grab bars or handrails.  First and last steps.  Where the edge of each step is.  Use tools that help you move around (mobility aids) if they are needed. These include:  Canes.  Walkers.  Scooters.  Crutches.  Turn on the lights when you go into a dark area. Replace any light bulbs as soon as they burn out.  Set up your furniture so you have a clear path. Avoid moving your furniture around.  If any of your floors are uneven, fix them.  If there are any pets around you, be aware of where they are.  Review your medicines with your doctor. Some medicines can make you feel dizzy. This can increase your chance of falling. Ask your doctor what other things that you can do to help prevent falls. This information is not intended to replace advice given to you by your health care provider. Make sure you discuss any questions you have with your health care provider. Document Released: 04/01/2009 Document Revised: 11/11/2015 Document Reviewed: 07/10/2014 Elsevier Interactive Patient Education  2017 [employer].

## 2019-08-21 NOTE — Progress Notes (Addendum)
Subjective:   Maryjane Delbert is a 74 y.o. female who presents for Medicare Annual (Subsequent) preventive examination.  Review of Systems: N/A   This visit is being conducted through telemedicine via telephone at the nurse health advisor's home address due to the COVID-19 pandemic. This patient has given me verbal consent via doximity to conduct this visit, patient states they are participating from their home address. Patient and myself are on the telephone call. There is no referral for this visit. Some vital signs may be absent or patient reported.    Patient identification: identified by name, DOB, and current address   Cardiac Risk Factors include: advanced age (>31men, >42 women);hypertension;dyslipidemia     Objective:     Vitals: There were no vitals taken for this visit.  There is no height or weight on file to calculate BMI.  Advanced Directives 08/21/2019 08/08/2018 02/06/2018 10/29/2017 08/06/2017 10/26/2016 10/21/2016  Does Patient Have a Medical Advance Directive? No No No No No No No  Would patient like information on creating a medical advance directive? No - Patient declined No - Patient declined - No - Patient declined No - Patient declined No - Patient declined -  Pre-existing out of facility DNR order (yellow form or pink MOST form) - - - - - - -    Tobacco Social History   Tobacco Use  Smoking Status Former Smoker  . Packs/day: 1.00  . Quit date: 06/26/1986  . Years since quitting: 33.1  Smokeless Tobacco Never Used     Counseling given: Not Answered   Clinical Intake:  Pre-visit preparation completed: Yes  Pain : No/denies pain     Nutritional Risks: None Diabetes: No  How often do you need to have someone help you when you read instructions, pamphlets, or other written materials from your doctor or pharmacy?: 1 - Never What is the last grade level you completed in school?: some college  Interpreter Needed?: No  Information entered by :: CJohnson,  LPN  Past Medical History:  Diagnosis Date  . Atrial fib/flutter, transient    1 episode, resolved, never on coumadin  . Cataract 2019   left eye  . Clotting disorder ([place])    on xarelto  . Diverticulosis 03/1997  . Dysrhythmia    atrial fibrilation  . GERD (gastroesophageal reflux disease) 1997   good on prilosec  . Hepatitis    hx of hep B (late 1970s)  . Hyperlipidemia 08/1996  . Hypothyroidism 1984  . Migraine    w/o aura  . Osteopenia 11/2002   via dexa  . Presence of dental prosthetic device    temporary while waiting for implant  . Skin cancer    skin cancer  . Stroke [facility]) 08/2011   TIA   Past Surgical History:  Procedure Laterality Date  . ABDOMINAL HYSTERECTOMY    . ANAL FISSURE REPAIR    . CATARACT EXTRACTION W/PHACO Right 02/06/2018   Procedure: CATARACT EXTRACTION PHACO AND INTRAOCULAR LENS PLACEMENT ([place]) RIGHT;  Surgeon: Leandrew Koyanagi, MD;  Location: [facility];  Service: Ophthalmology;  Laterality: Right;  . CHOLECYSTECTOMY  07/2006  . 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 nasolabial fold skin flap surgery  08/02/2007   [facility]  . PARTIAL HYSTERECTOMY  1978-79  . POLYPECTOMY  04/06/97   colon, benign by pathology  . varicose vein ablation sclerotherapy  09/2008   Dr. Hulda Humphrey   Family History  Problem Relation Age  of Onset  . Hypertension Mother   . Osteopenia Mother   . Stroke Mother        deceased age 84  . Colon cancer Father 60       mets  . Allergies Sister   . Breast cancer Cousin        pat cousin  . Rectal cancer Neg Hx    Social History   Socioeconomic History  . Marital status: Married    Spouse name: Not on file  . Number of children: 2  . Years of education: Not on file  . Highest education level: Not on file  Occupational History  . Occupation: Ecologist: RETIRED    Comment: [employer]  . 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 Smoking status: Former Smoker    Packs/day: 1.00    Quit date: 06/26/1986    Years since quitting: 33.1  . Smokeless tobacco: Never Used  Substance and Sexual Activity  . Alcohol use: No  . Drug use: No  . Sexual activity: Not Currently  Other Topics Concern  . Not on file  Social History Narrative   Works part time at [employer] office   Married 1979   2 daughters from prev relationship   [employer] fan, [employer] fan.   Social Determinants of Health   Financial Resource Strain: Low Risk   . Difficulty of Paying Living Expenses: Not hard at all  Food Insecurity: No Food Insecurity  . Worried About Charity fundraiser in the Last Year: Never true  . Ran Out of Food in the Last Year: Never true  Transportation Needs: No Transportation Needs  . Lack of Transportation (Medical): No  . Lack of Transportation (Non-Medical): No  Physical Activity: Inactive  . Days of Exercise per Week: 0 days  . Minutes of Exercise per Session: 0 min  Stress: No Stress Concern Present  . Feeling of Stress : Not at all  Social Connections:   . Frequency of Communication with Friends and Family: Not on file  . Frequency of Social Gatherings with Friends and Family: Not on file  . Attends Religious Services: Not on file  . Active Member of Clubs or Organizations: Not on file  . Attends Archivist Meetings: Not on file  . Marital Status: Not on file    Outpatient Encounter Medications as of 08/21/2019  Medication Sig  . albuterol (VENTOLIN HFA) 108 (90 Base) MCG/ACT inhaler Inhale 2 puffs into the lungs every 6 (six) hours as needed for wheezing or shortness of breath.  . Cholecalciferol (VITAMIN D3) 50 MCG (2000 UT) capsule Take 1 capsule (2,000 Units total) by mouth daily.  Marland Kitchen diltiazem (CARDIZEM) 30 MG tablet TAKE ONE TABLET BY MOUTH THREE TIMES DAILY AS NEEDED  . diphenhydrAMINE (BENADRYL) 25 MG tablet Take 25 mg by mouth every 8 (eight) hours as needed for itching.  .  levothyroxine (SYNTHROID) 150 MCG tablet Take 1 tablet (150 mcg total) by mouth daily. Except for 1.5 on Sunday  . loperamide (IMODIUM A-D) 2 MG tablet Take 2 mg by mouth 2 (two) times daily as needed for diarrhea or loose stools.  Marland Kitchen omeprazole (PRILOSEC) 20 MG capsule Take 1 capsule (20 mg total) by mouth daily.  . promethazine (PHENERGAN) 25 MG tablet TAKE ONE TABLET BY MOUTH EVERY 6 HOURS AS NEEDED FOR NAUSEA  . propranolol ER (INDERAL LA) 120 MG  24 hr capsule Take 1 capsule (120 mg total) by mouth daily.  . rivaroxaban (XARELTO) 20 MG TABS tablet TAKE 1 TABLET BY MOUTH ONCE DAILY WITH  SUPPER  . rosuvastatin (CRESTOR) 5 MG tablet Take 1 tablet (5 mg total) by mouth at bedtime.  . traMADol (ULTRAM) 50 MG tablet TAKE 1 TABLET BY MOUTH EVERY 8 HOURS AS NEEDED...CAUTION SEDATION  . gabapentin (NEURONTIN) 100 MG capsule 100 mg at bedtime.    No facility-administered encounter medications on file as of 08/21/2019.    Activities of Daily Living In your present state of health, do you have any difficulty performing the following activities: 08/21/2019  Hearing? N  Vision? N  Difficulty concentrating or making decisions? N  Walking or climbing stairs? N  Dressing or bathing? N  Doing errands, shopping? N  Preparing Food and eating ? N  Using the Toilet? N  In the past six months, have you accidently leaked urine? Y  Comment wears a pad  Do you have problems with loss of bowel control? N  Managing your Medications? N  Managing your Finances? N  Housekeeping or managing your Housekeeping? N  Some recent data might be hidden    Patient Care Team: Tonia Ghent, MD as PCP - General (Family Medicine) Dasher, Rayvon Char, MD as Consulting Physician (Dermatology) Minna Merritts, MD as Consulting Physician (Cardiology) Ocie Doyne, OD as Consulting Physician (Optometry) Dale Riverside, DDS as Consulting Physician (Dentistry)    Assessment:   This is a routine wellness examination for  Gregoria.  Exercise Activities and Dietary recommendations Current Exercise Habits: The patient does not participate in regular exercise at present, Exercise limited by: None identified  Goals    . Increase physical activity     Starting 08/08/2018, I will attempt to exercise for 60 minutes twice weekly and to make healthy food choices by eliminating added sugar in an effort to lose weight.     . Patient Stated     08/21/2019, I will try to work on losing some weight and exercising more.        Fall Risk Fall Risk  08/21/2019 08/08/2018 08/06/2017 08/03/2016 07/16/2012  Falls in the past year? 1 0 No No No  Comment a dog tripped her - - - -  Number falls in past yr: 0 - - - -  Injury with Fall? 0 - - - -  Risk for fall due to : Medication side effect - - - -  Follow up Falls evaluation completed;Falls prevention discussed - - - -   Is the patient's home free of loose throw rugs in walkways, pet beds, electrical cords, etc?   yes      Grab bars in the bathroom? yes      Handrails on the stairs?   yes      Adequate lighting?   yes  Timed Get Up and Go performed: N/A  Depression Screen PHQ 2/9 Scores 08/21/2019 08/08/2018 08/06/2017 08/03/2016  PHQ - 2 Score 0 0 0 0  PHQ- 9 Score 0 0 0 -     Cognitive Function MMSE - Mini Mental State Exam 08/21/2019 08/08/2018 08/06/2017 08/03/2016  Orientation to time 5 5 5 5   Orientation to Place 5 5 5 5   Registration 3 3 3 3   Attention/ Calculation 5 0 0 0  Recall 3 3 3 3   Language- name 2 objects - 0 0 0  Language- repeat 1 1 1 1   Language- follow 3 step command -  3 3 3   Language- read & follow direction - 0 0 0  Write a sentence - 0 0 0  Copy design - 0 0 0  Total score - 20 20 20   Mini Cog  Mini-Cog screen was completed. Maximum score is 22. A value of 0 denotes this part of the MMSE was not completed or the patient failed this part of the Mini-Cog screening.       Immunization History  Administered Date(s) Administered  . Fluad Quad(high  Dose 65+) 03/21/2019  . Influenza Split 04/13/2011, 04/26/2012  . Influenza Whole 04/07/2009, 04/13/2010  . Influenza, High Dose Seasonal PF 04/09/2017, 04/18/2018  . Influenza,inj,Quad PF,6+ Mos 05/01/2013, 04/30/2014, 04/05/2015, 06/05/2016  . Pneumococcal Conjugate-13 08/06/2014  . Pneumococcal Polysaccharide-23 07/16/2012  . Td 04/04/2006, 02/20/2012  . Zoster 05/02/2007    Qualifies for Shingles Vaccine: Yes  Screening Tests Health Maintenance  Topic Date Due  . MAMMOGRAM  03/04/2020  . TETANUS/TDAP  02/19/2022  . COLONOSCOPY  10/30/2022  . INFLUENZA VACCINE  Completed  . DEXA SCAN  Completed  . Hepatitis C Screening  Completed  . PNA vac Low Risk Adult  Completed    Cancer Screenings: Lung: Low Dose CT Chest recommended if Age 40-80 years, 30 pack-year currently smoking OR have quit w/in 15 years. Patient does not qualify. Breast:  Up to date on Mammogram: Yes, completed 03/05/2019   Bone Density/Dexa: completed 10/16/2016 Colorectal: completed 10/29/2017  Additional Screenings:  Hepatitis C Screening: 07/27/2016     Plan:   Patient will try to work on losing some weight and exercising more.    I have personally reviewed and noted the following in the patient's chart:   . Medical and social history . Use of alcohol, tobacco or illicit drugs  . Current medications and supplements . Functional ability and status . Nutritional status . Physical activity . Advanced directives . List of other physicians . Hospitalizations, surgeries, and ER visits in previous 12 months . Vitals . Screenings to include cognitive, depression, and falls . Referrals and appointments  In addition, I have reviewed and discussed with patient certain preventive protocols, quality metrics, and best practice recommendations. A written personalized care plan for preventive services as well as general preventive health recommendations were provided to patient.     Andrez Grime,  LPN  [id number]   I reviewed health advisor's note, was available for consultation on the day of service listed in this note, and agree with documentation and plan. Elsie Stain, MD.

## 2019-08-21 NOTE — Progress Notes (Addendum)
PCP notes:  Health Maintenance: No gaps noted   Abnormal Screenings: none   Patient concerns: Issues with urinary and bowel incontinence Discuss why she is taking gabapentin   Nurse concerns: none   Next PCP appt.: 08/22/2019 @ 12 pm  ======================  gabapentin (NEURONTIN) 100 MG capsule  Indications: Other specified dermatitis, Pruritus Take 1 capsule (100 mg total) by mouth nightly for 30 days, THEN 2 capsules (200 mg total) nightly for 60 days. 150 capsule  0 03/24/2019 06/22/2019   rx per outside clinic.  Can d/w pt at [facility].   Elsie Stain

## 2019-08-22 ENCOUNTER — Other Ambulatory Visit: Payer: Self-pay

## 2019-08-22 ENCOUNTER — Encounter: Payer: Self-pay | Admitting: Family Medicine

## 2019-08-22 ENCOUNTER — Ambulatory Visit (INDEPENDENT_AMBULATORY_CARE_PROVIDER_SITE_OTHER)
Admission: RE | Admit: 2019-08-22 | Discharge: 2019-08-22 | Disposition: A | Discharge status: Home / Self Care | Payer: PPO | Source: Ambulatory Visit | Attending: Family Medicine | Admitting: Family Medicine

## 2019-08-22 ENCOUNTER — Ambulatory Visit (INDEPENDENT_AMBULATORY_CARE_PROVIDER_SITE_OTHER): Payer: PPO | Admitting: Family Medicine

## 2019-08-22 VITALS — BP 150/78 | HR 83 | Temp 97.3°F | Ht 64.0 in | Wt 241.6 lb

## 2019-08-22 DIAGNOSIS — E039 Hypothyroidism, unspecified: Secondary | ICD-10-CM | POA: Diagnosis not present

## 2019-08-22 DIAGNOSIS — L299 Pruritus, unspecified: Secondary | ICD-10-CM

## 2019-08-22 DIAGNOSIS — R0602 Shortness of breath: Secondary | ICD-10-CM

## 2019-08-22 DIAGNOSIS — N393 Stress incontinence (female) (male): Secondary | ICD-10-CM | POA: Diagnosis not present

## 2019-08-22 DIAGNOSIS — M25559 Pain in unspecified hip: Secondary | ICD-10-CM

## 2019-08-22 DIAGNOSIS — M810 Age-related osteoporosis without current pathological fracture: Secondary | ICD-10-CM

## 2019-08-22 DIAGNOSIS — I480 Paroxysmal atrial fibrillation: Secondary | ICD-10-CM

## 2019-08-22 DIAGNOSIS — E7849 Other hyperlipidemia: Secondary | ICD-10-CM

## 2019-08-22 DIAGNOSIS — Z0000 Encounter for general adult medical examination without abnormal findings: Secondary | ICD-10-CM

## 2019-08-22 DIAGNOSIS — Z7189 Other specified counseling: Secondary | ICD-10-CM

## 2019-08-22 MED ORDER — PROMETHAZINE HCL 25 MG PO TABS: 25.0000 mg | ORAL_TABLET | Freq: Four times a day (QID) | ORAL | 3 refills | Status: DC | PRN

## 2019-08-22 MED ORDER — VITAMIN D3 50 MCG (2000 UT) PO CAPS: 4000.0000 [IU] | ORAL_CAPSULE | Freq: Every day | ORAL | 3 refills | Status: DC

## 2019-08-22 MED ORDER — OMEPRAZOLE 20 MG PO CPDR: 20.0000 mg | DELAYED_RELEASE_CAPSULE | Freq: Every day | ORAL | 3 refills | Status: DC

## 2019-08-22 MED ORDER — LEVOTHYROXINE SODIUM 150 MCG PO TABS: 150.0000 ug | ORAL_TABLET | Freq: Every day | ORAL | 3 refills | Status: DC

## 2019-08-22 MED ORDER — PROPRANOLOL HCL ER 120 MG PO CP24: 120.0000 mg | ORAL_CAPSULE | Freq: Every day | ORAL | 3 refills | Status: DC

## 2019-08-22 MED ORDER — RIVAROXABAN 20 MG PO TABS: ORAL_TABLET | ORAL | 3 refills | Status: DC

## 2019-08-22 MED ORDER — VITAMIN D3 50 MCG (2000 UT) PO CAPS: 4000.0000 [IU] | ORAL_CAPSULE | Freq: Every day | ORAL | Status: DC

## 2019-08-22 MED ORDER — TRAMADOL HCL 50 MG PO TABS: ORAL_TABLET | ORAL | 2 refills | Status: DC

## 2019-08-22 NOTE — Patient Instructions (Addendum)
Cut gabapentin back to 1 pill at night for 1 week, then stop.  Update me as needed.  Go to the lab on the way out.   If you have mychart we'll likely use that to update you.    Update me as needed.  I'll update cardiology and pulmonary.  Take care.  Glad to see you.

## 2019-08-22 NOTE — Progress Notes (Signed)
This visit occurred during the SARS-CoV-2 public health emergency.  Safety protocols were in place, including screening questions prior to the visit, additional usage of staff PPE, and extensive cleaning of exam room while observing appropriate contact time as indicated for disinfecting solutions.  Mammogram 2020 DXA 2018 with osteoporosis, d/w pt about options.   She'll consider fosamax.  D/w pt about DXA results and osteoporosis path/phys in general, including vit D and calcium.  Reasonable to consider treatment with bisphosphonate, ie fosamax.  D/w pt about risk benefit, especially GI sx, jaw and long bone pathology.   Living will d/w pt. Would have her daughter Heriberto [place] designated if incapacitated. Her husband has parkinson's sx and that is affecting his memory per patient report.  Vaccines up to date except for shingrix- d/w pt   covid- pfizer, 08/06/19, she has f/u pending.   Colonoscopy 2019.  Fall cautions d/w pt.    Issues with urinary and bowel incontinence. She has urinary urgency and fecal urgency.  Can be triggered by diet with stool urgency.  She has awareness; she doesn't have leakage w/o awareness.  Sx gradually worse over the last few years.  No burning with urination, no blood in stool.  She wears at pad at baseline.  She has SUI with cough/sneeze.  D/w pt about trying Kegels.  She'll update me as needed.    Discussed why she is taking gabapentin- rx'd per outside clinic for itching.  She had prev rash on the B arms and legs.  It got better with betamethasone dipropionate  per derm clinic.  She is still on gabapentin at this point, taking 2 at night.  D/w pt about cutting back to 1 at night then stopping after a few days.    AF/Hypertension:    Using medication without problems or lightheadedness:yes  Chest pain with exertion:no Edema:no Short of breath: see below.   She has cards f/u pending.  Still anticoagulated.  No bleeding.  PLT minimally low, d/w pt.    Tramadol used for leg/hip pain at night, prn with relief, no ADE on med.   She had covid sx in 2020.  She wasn't able to be tested at that point but had neg f/u testing later in 2020.   Her husband prev tested positive concurrently.  She has had some SOB in the meantime, along with more Afib sx, since having covid.  No wheeze.    Elevated Cholesterol: Using medications without problems: yes Muscle aches: no Diet compliance: yes Exercise: limited, see above.    Hypothyroidism.  TSh wnl.  Discussed with patient.  Compliant with medications.  Vit D wnl with 4000 IU per day.  Compliant. Labs d/w pt.    Meds, vitals, and allergies reviewed.   ROS: Per HPI unless specifically indicated in ROS section   GEN: nad, alert and oriented HEENT: ncat NECK: supple w/o LA CV: rrr PULM: ctab, no inc wob ABD: soft, +bs EXT: no edema SKIN: Well-perfused.

## 2019-08-24 DIAGNOSIS — L299 Pruritus, unspecified: Secondary | ICD-10-CM | POA: Insufficient documentation

## 2019-08-24 DIAGNOSIS — R0602 Shortness of breath: Secondary | ICD-10-CM | POA: Insufficient documentation

## 2019-08-24 DIAGNOSIS — N393 Stress incontinence (female) (male): Secondary | ICD-10-CM | POA: Insufficient documentation

## 2019-08-24 NOTE — Assessment & Plan Note (Signed)
Continue work on diet and exercise as best she can.  No change in meds at this point.  She agrees.

## 2019-08-24 NOTE — Assessment & Plan Note (Addendum)
TSh wnl.  Discussed with patient.  Compliant with medications. Continue as is.  She agrees.

## 2019-08-24 NOTE — Assessment & Plan Note (Signed)
She had symptoms and her husband concurrently tested positive for Covid in 2020.  She has had some SOB in the meantime, along with more Afib sx, since having (presumed) covid.  No wheeze.  I would like her to follow-up with the pulmonary clinic.  I will route this to pulmonary for input.

## 2019-08-24 NOTE — Assessment & Plan Note (Signed)
Mammogram 2020 DXA 2018 with osteoporosis, d/w pt about options.   She'll consider fosamax.  D/w pt about DXA results and osteoporosis path/phys in general, including vit D and calcium.  Reasonable to consider treatment with bisphosphonate, ie fosamax.  D/w pt about risk benefit, especially GI sx, jaw and long bone pathology.   Living will d/w pt. Would have her daughter Heriberto [place] designated if incapacitated. Her husband has parkinson's sx and that is affecting his memory per patient report.  Vaccines up to date except for shingrix- d/w pt   covid- pfizer, 08/06/19, she has f/u pending.   Colonoscopy 2019.  Fall cautions d/w pt.

## 2019-08-24 NOTE — Assessment & Plan Note (Signed)
She has SUI with cough/sneeze.  D/w pt about trying Kegels.  She'll update me as needed.   Kegel exercises may also help with her bowel control, along with diet modification.

## 2019-08-24 NOTE — Assessment & Plan Note (Signed)
Tramadol used for leg/hip pain at night, prn with relief, no ADE on med.  Reasonable to continue as is.

## 2019-08-24 NOTE — Assessment & Plan Note (Signed)
She has cards f/u pending.  She has had more episodes of elevated heart rate attributed to atrial fibrillation since she has had Covid. Still anticoagulated.  No bleeding.  PLT minimally low, d/w pt.  No change in medications at this point.

## 2019-08-24 NOTE — Assessment & Plan Note (Signed)
Living will d/w pt. Would have her daughter Heriberto [place] designated if incapacitated. Her husband has parkinson's sx and that is affecting his memory per patient report.

## 2019-08-24 NOTE — Assessment & Plan Note (Signed)
Discussed why she is taking gabapentin- rx'd per outside clinic for itching.  She had prev rash on the B arms and legs.  It got better with betamethasone dipropionate  per derm clinic.  She is still on gabapentin at this point, taking 2 at night.  D/w pt about cutting gabapentin back to 1 at night then stopping after a few days.  She will update me as needed.

## 2019-08-24 NOTE — Assessment & Plan Note (Signed)
Vit D wnl with 4000 IU per day.  Compliant. Labs d/w pt.   DXA 2018 with osteoporosis, d/w pt about options.   She'll consider fosamax.  D/w pt about DXA results and osteoporosis path/phys in general, including vit D and calcium.  Reasonable to consider treatment with bisphosphonate, ie fosamax.  D/w pt about risk benefit, especially GI sx, jaw and long bone pathology.

## 2019-08-26 ENCOUNTER — Other Ambulatory Visit: Payer: Self-pay | Admitting: Pulmonary Disease

## 2019-08-26 DIAGNOSIS — R911 Solitary pulmonary nodule: Secondary | ICD-10-CM

## 2019-08-31 NOTE — Progress Notes (Signed)
Cardiology Office Note  Date:  09/01/2019   ID:  Jasiri, Sandin 15-Oct-1945, MRN [id number]  PCP:  Tonia Ghent, MD   Chief Complaint  Patient presents with  . other    12 month follow up. "doing well."  Pt. c/o shortness of breath.     HPI:  Ms. Kneisley is a very pleasant 74 year-old woman with  morbid obesity,  hypothyroidism,  remote smoking history for less than 10 years,  prior TIA,CVA.  atrial fibrillation seen on Holter monitor in 2013 She presents today for follow-up of her paroxysmal atrial fibrillation  Covid 08/2018 More atrial fib in recovery Echo 07/2018 1. The left ventricle has normal systolic function, with an ejection  fraction of 55-60%. The cavity size was normal. Left ventricular diastolic  parameters were normal.   Continued SOB Had vaccine: atrial fib after Had both  When in atrial fib, feels like like indigestion, palpitations Lasting 10 to 15 min, longest 1 -2 hrs  No regular exercise program Denies any shortness of breath or chest pain No TIA or stroke symptoms  EKG personally reviewed by myself on todays visit Shows NSR rate 65 bpm  One episode of atrial fibrillation April 03, 2018 2 hour spell on beach trip Took diltiazem x 3, propranolol 20 x1,  Finally went back to normal sinus rhythm Typically episodes of atrial fibrillation will last 20 to 30 minutes, this was the longest Prior episode before that was 3 to 4 months earlier  Hospitalization May 2018 Pneumonia with COPD exacerbation Treated with antibiotics and steroids -CT high-resolution without ILD findings  -Echocardiogram with normal EF and diastolic function  -D-dimer negative, low suspicion for PE  -Possible component of COPD, although no previous diagnosis.    February 22nd 2016 she had 30 minutes of tachycardia. She is concerned this could have been atrial fibrillation   08/25/2011,  right eye vision changes, right arm weakness and numbness lasting for 30-40  minutes. Symptoms seemed to resolve. The next day, on Saturday, she had some mouth and facial numbness and she went to the emergency room. She also had headache. They did a EKG and CT scan of the head that showed no stroke. Early in the week, she continued to have headache and mouth issues and she was taken to [facility]. She had MRA that showed small vessel disease, carotid ultrasound that showed mild plaque. She reports that she was in the hospital for 3 days. No atrial fibrillation was seen.   PMH:   has a past medical history of Atrial fib/flutter, transient, Cataract (2019), Clotting disorder ([place]), Diverticulosis (03/1997), Dysrhythmia, GERD (gastroesophageal reflux disease) (1997), Hepatitis, Hyperlipidemia (08/1996), Hypothyroidism (1984), Migraine, Osteopenia (11/2002), Presence of dental prosthetic device, Skin cancer, and Stroke ([place]) (08/2011).  PSH:    Past Surgical History:  Procedure Laterality Date  . ABDOMINAL HYSTERECTOMY    . ANAL FISSURE REPAIR    . CATARACT EXTRACTION W/PHACO Right 02/06/2018   Procedure: CATARACT EXTRACTION PHACO AND INTRAOCULAR LENS PLACEMENT ([place]) RIGHT;  Surgeon: Leandrew Koyanagi, MD;  Location: [place];  Service: Ophthalmology;  Laterality: Right;  . CHOLECYSTECTOMY  07/2006  . 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 nasolabial fold skin flap surgery  08/02/2007   [facility]  . PARTIAL HYSTERECTOMY  1978-79  . POLYPECTOMY  04/06/97   colon, benign by pathology  . varicose vein ablation sclerotherapy  09/2008   Dr. Hulda Humphrey    Current Outpatient  Medications  Medication Sig Dispense Refill  . albuterol (VENTOLIN HFA) 108 (90 Base) MCG/ACT inhaler Inhale 2 puffs into the lungs every 6 (six) hours as needed for wheezing or shortness of breath. 8 g 3  . Cholecalciferol (VITAMIN D3) 50 MCG (2000 UT) capsule Take 2 capsules (4,000 Units total) by mouth daily. 180 capsule 3  . diltiazem (CARDIZEM) 30 MG tablet TAKE  ONE TABLET BY MOUTH THREE TIMES DAILY AS NEEDED 90 tablet 1  . diphenhydrAMINE (BENADRYL) 25 MG tablet Take 25 mg by mouth every 8 (eight) hours as needed for itching.    . gabapentin (NEURONTIN) 100 MG capsule 100 mg at bedtime.     Marland Kitchen levothyroxine (SYNTHROID) 150 MCG tablet Take 1 tablet (150 mcg total) by mouth daily. Except for 1.5 on Sunday 100 tablet 3  . loperamide (IMODIUM A-D) 2 MG tablet Take 2 mg by mouth 2 (two) times daily as needed for diarrhea or loose stools.    Marland Kitchen omeprazole (PRILOSEC) 20 MG capsule Take 1 capsule (20 mg total) by mouth daily. 90 capsule 3  . promethazine (PHENERGAN) 25 MG tablet Take 1 tablet (25 mg total) by mouth every 6 (six) hours as needed. for nausea 30 tablet 3  . propranolol ER (INDERAL LA) 120 MG 24 hr capsule Take 1 capsule (120 mg total) by mouth daily. 90 capsule 3  . rivaroxaban (XARELTO) 20 MG TABS tablet TAKE 1 TABLET BY MOUTH ONCE DAILY WITH  SUPPER 90 tablet 3  . rosuvastatin (CRESTOR) 5 MG tablet Take 1 tablet (5 mg total) by mouth at bedtime. 90 tablet 2  . traMADol (ULTRAM) 50 MG tablet TAKE 1 TABLET BY MOUTH EVERY 8 HOURS AS NEEDED...CAUTION SEDATION 30 tablet 2   No current facility-administered medications for this visit.     Allergies:   Celebrex [celecoxib], Doxycycline, Gold-containing drug products, Nickel, Other, Sodium metabisulfite, and Zocor [simvastatin]   Social History:  The patient  reports that she quit smoking about 33 years ago. She smoked 1.00 pack per day. She has never used smokeless tobacco. She reports that she does not drink alcohol or use drugs.   Family History:   family history includes Allergies in her sister; Breast cancer in her cousin; Colon cancer (age of onset: 26) in her father; Hypertension in her mother; Osteopenia in her mother; Stroke in her mother.    Review of Systems: Review of Systems  Constitutional: Negative.   Respiratory: Negative.   Cardiovascular: Negative.   Gastrointestinal: Negative.    Musculoskeletal: Positive for joint pain.  Neurological: Negative.   Psychiatric/Behavioral: Negative.   All other systems reviewed and are negative.    PHYSICAL EXAM: VS:  BP 120/70 (BP Location: Left Arm, Patient Position: Sitting, Cuff Size: Large)   Pulse 65   Ht 5\' 4"  (1.626 m)   Wt 239 lb (108.4 kg)   SpO2 93%   BMI 41.02 kg/m  , BMI Body mass index is 41.02 kg/m.  Constitutional:  oriented to person, place, and time. No distress.  HENT:  Head: Grossly normal Eyes:  no discharge. No scleral icterus.  Neck: No JVD, no carotid bruits  Cardiovascular: Regular rate and rhythm, no murmurs appreciated Pulmonary/Chest: Clear to auscultation bilaterally, no wheezes or rails Abdominal: Soft.  no distension.  no tenderness.  Musculoskeletal: Normal range of motion Neurological:  normal muscle tone. Coordination normal. No atrophy Skin: Skin warm and dry Psychiatric: normal affect, pleasant   Recent Labs: 08/13/2019: ALT 12; BUN 7; Creatinine, Ser 0.65;  Hemoglobin 12.5; Platelets 140.0; Potassium 3.9; Sodium 142; TSH 1.60    Lipid Panel Lab Results  Component Value Date   CHOL 106 08/13/2019   HDL 38.00 (L) 08/13/2019   LDLCALC 44 08/13/2019   TRIG 119.0 08/13/2019    Wt Readings from Last 3 Encounters:  09/01/19 239 lb (108.4 kg)  08/22/19 241 lb 9 oz (109.6 kg)  03/31/19 239 lb 12.8 oz (108.8 kg)      ASSESSMENT AND PLAN:  Paroxysmal atrial fibrillation (HCC) Rare episodes on propanolol and  xarelto Diltiazem as needed  Pure hypercholesterolemia Cholesterol is at goal on the current lipid regimen. No changes to the medications were made.  Essential hypertension Blood pressure is well controlled on today's visit. No changes made to the medications.  Morbid obesity ([place]) We have encouraged continued exercise, careful diet management in an effort to lose weight.  SOB Weight up, No energy CT scheduled Could do an echo if symptoms persist with walking  program    Total encounter time more than 25 minutes  Greater than 50% was spent in counseling and coordination of care with the patient   Disposition:   F/U  12 months   Orders Placed This Encounter  Procedures  . EKG 12-Lead     Signed, Esmond Plants, M.D., Ph.D. 09/01/2019  [place], [place]

## 2019-09-01 ENCOUNTER — Encounter: Payer: Self-pay | Admitting: Cardiovascular Disease

## 2019-09-01 ENCOUNTER — Ambulatory Visit (INDEPENDENT_AMBULATORY_CARE_PROVIDER_SITE_OTHER): Payer: PPO | Admitting: Cardiovascular Disease

## 2019-09-01 ENCOUNTER — Other Ambulatory Visit: Payer: Self-pay

## 2019-09-01 VITALS — BP 120/70 | HR 65 | Ht 64.0 in | Wt 239.0 lb

## 2019-09-01 DIAGNOSIS — I25118 Atherosclerotic heart disease of native coronary artery with other forms of angina pectoris: Secondary | ICD-10-CM

## 2019-09-01 DIAGNOSIS — I480 Paroxysmal atrial fibrillation: Secondary | ICD-10-CM

## 2019-09-01 DIAGNOSIS — E7800 Pure hypercholesterolemia, unspecified: Secondary | ICD-10-CM | POA: Diagnosis not present

## 2019-09-01 DIAGNOSIS — I288 Other diseases of pulmonary vessels: Secondary | ICD-10-CM | POA: Diagnosis not present

## 2019-09-01 DIAGNOSIS — I10 Essential (primary) hypertension: Secondary | ICD-10-CM | POA: Diagnosis not present

## 2019-09-01 NOTE — Patient Instructions (Signed)

## 2019-09-02 ENCOUNTER — Ambulatory Visit (INDEPENDENT_AMBULATORY_CARE_PROVIDER_SITE_OTHER)
Admission: RE | Admit: 2019-09-02 | Discharge: 2019-09-02 | Disposition: A | Discharge status: Home / Self Care | Payer: PPO | Source: Ambulatory Visit | Attending: Pulmonary Disease | Admitting: Pulmonary Disease

## 2019-09-02 DIAGNOSIS — R911 Solitary pulmonary nodule: Secondary | ICD-10-CM | POA: Diagnosis not present

## 2019-09-02 DIAGNOSIS — R0602 Shortness of breath: Secondary | ICD-10-CM | POA: Diagnosis not present

## 2019-09-03 ENCOUNTER — Telehealth: Payer: Self-pay | Admitting: Pulmonary Disease

## 2019-09-03 NOTE — Telephone Encounter (Signed)
Received call report on pt from [place] with [facility] regarding pt's HRCT chest. Impression is below:  IMPRESSION: 1. No evidence of interstitial lung disease. 2. Multiple new bilateral pulmonary nodules are worrisome for metastatic disease. An infectious or inflammatory etiology is not excluded, especially given the history of waxing and waning pulmonary nodules on comparison exams. These results will be called to the ordering clinician or representative by the Radiologist Assistant, and communication documented in the PACS or [employer]. 3. Aortic atherosclerosis (ICD10-I70.0). Coronary artery calcification. 4. Enlarged pulmonic trunk, indicative of pulmonary arterial Hypertension.   Dr. Valeta Harms please advise. Thanks.

## 2019-09-04 NOTE — Telephone Encounter (Signed)
PCCM:  I called and spoke with patient regarding CT scan of the chest.  Please order PET scan for evaluation of multiple pulmonary nodules concerning for metastatic disease.   [facility] 09/04/2019 5:59 PM

## 2019-09-05 ENCOUNTER — Other Ambulatory Visit: Payer: Self-pay | Admitting: Pulmonary Disease

## 2019-09-05 DIAGNOSIS — R911 Solitary pulmonary nodule: Secondary | ICD-10-CM

## 2019-09-08 NOTE — Telephone Encounter (Signed)
PET scan scheduled for 09/11/2019. Will sign off.

## 2019-09-11 ENCOUNTER — Other Ambulatory Visit: Payer: Self-pay

## 2019-09-11 ENCOUNTER — Encounter
Admission: RE | Admit: 2019-09-11 | Discharge: 2019-09-11 | Disposition: A | Discharge status: Home / Self Care | Payer: PPO | Source: Ambulatory Visit | Attending: Pulmonary Disease | Admitting: Pulmonary Disease

## 2019-09-11 DIAGNOSIS — R590 Localized enlarged lymph nodes: Secondary | ICD-10-CM | POA: Diagnosis not present

## 2019-09-11 DIAGNOSIS — R911 Solitary pulmonary nodule: Secondary | ICD-10-CM | POA: Diagnosis not present

## 2019-09-11 LAB — GLUCOSE, CAPILLARY: Glucose-Capillary: 110 mg/dL — ABNORMAL HIGH (ref 70–99)

## 2019-09-11 MED ORDER — FLUDEOXYGLUCOSE F - 18 (FDG) INJECTION
12.4000 | Freq: Once | INTRAVENOUS | Status: AC | PRN
  Administered 2019-09-11: 13.3 via INTRAVENOUS

## 2019-09-12 DIAGNOSIS — G4733 Obstructive sleep apnea (adult) (pediatric): Secondary | ICD-10-CM | POA: Diagnosis not present

## 2019-09-23 ENCOUNTER — Other Ambulatory Visit: Payer: Self-pay

## 2019-09-23 MED ORDER — TRAMADOL HCL 50 MG PO TABS: ORAL_TABLET | ORAL | 2 refills | Status: DC

## 2019-09-23 NOTE — Telephone Encounter (Signed)
Sent. Thanks.   

## 2019-09-23 NOTE — Telephone Encounter (Signed)
Pt said that she needs refill on tramadol 50 mg to [facility]. Pt said that [facility] had notified pt that her Tramadol rx had expired. I spoke with [facility] tech with [facility] and she first said she was not sure why the tramadol was not filled when sent electronically to [facility]. Katharine Look said after 30 days of med not being filled the rx would expire as of 09/22/19. I told Katharine Look that there should be a reason why rx was not filled when it was sent to them on 08/22/19. Katharine Look came back on the phone and said rx was not filled because pt did not request tramadol to be filled. I verified that tramadol 50 mg # 30 x 2 sent to [facility] was never filled and Katharine Look said that was correct and rx had expired so would need new rx. I advised pt and she requested new tramadol rx sent to [facility]; pt will get text from [place] when med is ready for pick up. Please advise. Pt had annual exam on 08/22/19.

## 2019-09-24 ENCOUNTER — Telehealth: Payer: Self-pay | Admitting: Pulmonary Disease

## 2019-09-24 DIAGNOSIS — R948 Abnormal results of function studies of other organs and systems: Secondary | ICD-10-CM

## 2019-09-24 NOTE — Telephone Encounter (Signed)
I thank all involved. 

## 2019-09-24 NOTE — Telephone Encounter (Signed)
PCCM:  Referrals placed to GI as well as ENT for abnormal PET.  Thanks  Garner Nash, DO [facility] 09/24/2019 5:47 PM

## 2019-09-26 NOTE — Telephone Encounter (Signed)
Pt has GI follow up with Dr. Henrene Pastor on 4/21.   Will route to Dr. Valeta Harms as Juluis Rainier.

## 2019-10-03 ENCOUNTER — Telehealth: Payer: Self-pay | Admitting: Pulmonary Disease

## 2019-10-03 NOTE — Telephone Encounter (Signed)
Attempted to call pt but unable to reach. Left message for pt to return call. 

## 2019-10-06 NOTE — Telephone Encounter (Signed)
LMTCB x2  

## 2019-10-07 NOTE — Telephone Encounter (Signed)
Left message for patient to call back  

## 2019-10-07 NOTE — Telephone Encounter (Signed)
Called pt to go over [place] for appt w/ BI tomorrow.  Pt would like to speak w/ someone about possibly rescheduling this appointment. Pt can be reached at [phone].  Pt states she did not get any messages or missed calls yesterday.

## 2019-10-07 NOTE — Telephone Encounter (Signed)
Patient is returning phone call. Patient phone number is [phone].

## 2019-10-08 ENCOUNTER — Ambulatory Visit (INDEPENDENT_AMBULATORY_CARE_PROVIDER_SITE_OTHER): Payer: PPO | Admitting: Pulmonary Disease

## 2019-10-08 ENCOUNTER — Encounter: Payer: Self-pay | Admitting: Internal Medicine

## 2019-10-08 ENCOUNTER — Ambulatory Visit: Payer: PPO | Admitting: Internal Medicine

## 2019-10-08 VITALS — BP 118/70 | HR 72 | Temp 97.6°F | Ht 64.0 in | Wt 239.0 lb

## 2019-10-08 DIAGNOSIS — R9389 Abnormal findings on diagnostic imaging of other specified body structures: Secondary | ICD-10-CM

## 2019-10-08 NOTE — Progress Notes (Signed)
Error - patient cancelled

## 2019-10-08 NOTE — Telephone Encounter (Signed)
Called and spoke with [place].  Patient stated she would like to reschedule her OV with Dr Valeta Harms, because she has GI appointment this morning, and ENT appointment next Friday. Patient stated she needed those appointments before seeing Dr Valeta Harms, so Dr Valeta Harms would have results and recommendations. Patient is rescheduled 08/27/19, at 3:45pm with Dr. Valeta Harms. Nothing further at this time.

## 2019-10-08 NOTE — Patient Instructions (Signed)
If you are age 73 or older, your body mass index should be between 23-30. Your Body mass index is 41.02 kg/m. If this is out of the aforementioned range listed, please consider follow up with your Primary Care Provider.  If you are age 48 or younger, your body mass index should be between 19-25. Your Body mass index is 41.02 kg/m. If this is out of the aformentioned range listed, please consider follow up with your Primary Care Provider.   We will call you after speaking to Dr Valeta Harms  It was a pleasure to see you today!  Dr. Henrene Pastor

## 2019-10-08 NOTE — Progress Notes (Signed)
HISTORY OF PRESENT ILLNESS:  Kara Wright is a 74 y.o. female with multiple medical problems as listed below who has been followed in this office over the years for high risk colon cancer screening and GERD.  She is sent today at the request of oncology, Dr. Talitha Wright regarding the possible need for endoscopic examinations for possible occult malignancy.  The patient's history dates back to March 2020 when she developed Covid related illness.  At that time she was found to have pulmonary nodule of uncertain significance.  This has been followed by pulmonary.  A nuclear medicine PET CT skull to thigh was performed September 11, 2019.  I have reviewed that study.  The significant findings were hypermetabolic activity within the left upper lobe pulmonary nodule; pelvic and retroperitoneal adenopathy; and nonspecific pharyngeal activity on the right.  For her pulmonary nodules she is seeing Dr. Valeta Wright (whom I spoke to today personally).  She also has ENT evaluation arranged.  In terms of her GI review of systems, she reports chronic stable diarrhea since her cholecystectomy.  No bleeding or other abnormality.  For her GERD she is maintained on omeprazole with good control of symptoms.  Last upper endoscopy in 2013 was remarkable for mild esophagitis and gastritis.  Helicobacter pylori negative.  She has undergone screening colonoscopy in 2008, 2013, and 2019.  All negative for neoplasia except for a diminutive adenoma on the most recent examination.  Her father had colon cancer before age 36.  Review of blood work shows normal CBC with hemoglobin 12.5.  Normal MCV.  Normal liver test.  REVIEW OF SYSTEMS:  All non-GI ROS negative unless otherwise stated in the HPI.  Past Medical History:  Diagnosis Date  . Atrial fib/flutter, transient    1 episode, resolved, never on coumadin  . Cataract 2019   left eye  . Clotting disorder ([place])    on xarelto  . Diverticulosis 03/1997  . Dysrhythmia    atrial fibrilation  .  GERD (gastroesophageal reflux disease) 1997   good on prilosec  . Hepatitis    hx of hep B (late 1970s)  . Hyperlipidemia 08/1996  . Hypothyroidism 1984  . Migraine    w/o aura  . Osteopenia 11/2002   via dexa  . Presence of dental prosthetic device    temporary while waiting for implant  . Skin cancer    skin cancer  . Stroke [facility]) 08/2011   TIA    Past Surgical History:  Procedure Laterality Date  . ABDOMINAL HYSTERECTOMY    . ANAL FISSURE REPAIR    . CATARACT EXTRACTION W/PHACO Right 02/06/2018   Procedure: CATARACT EXTRACTION PHACO AND INTRAOCULAR LENS PLACEMENT ([place]) RIGHT;  Surgeon: Kara Koyanagi, MD;  Location: [facility];  Service: Ophthalmology;  Laterality: Right;  . CHOLECYSTECTOMY  07/2006  . 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 nasolabial fold skin flap surgery  08/02/2007   [facility]  . PARTIAL HYSTERECTOMY  1978-79  . POLYPECTOMY  04/06/97   colon, benign by pathology  . varicose vein ablation sclerotherapy  09/2008   Dr. Hulda Wright    Social History Kara Wright  reports that she quit smoking about 33 years ago. She smoked 1.00 pack per day. She has never used smokeless tobacco. She reports that she does not drink alcohol or use drugs.  family history includes Allergies in her sister; Breast cancer in her cousin; Colon cancer (age of onset: 47) in her  father; Hypertension in her mother; Osteopenia in her mother; Stroke in her mother.  Allergies  Allergen Reactions  . Celebrex [Celecoxib]     REACTION: nausea: GI UPSET  . Doxycycline Other (See Comments)    GI upset- not an allergy.    . Gold-Containing Drug Products   . Nickel     Skin rash, allergy  . Other     Fragrance, phenylenediamine, contrast metal agents, gold  . Sodium Metabisulfite Other (See Comments)    Positive patch test  . Zocor [Simvastatin]     Aches but tolerates crestor       PHYSICAL EXAMINATION: Vital signs: BP 118/70   Pulse 72    Temp 97.6 F (36.4 C)   Ht 5\' 4"  (1.626 m)   Wt 239 lb (108.4 kg)   BMI 41.02 kg/m   Constitutional: Pleasant, overweight, unhealthy appearing, no acute distress Psychiatric: alert and oriented x3, cooperative Eyes: extraocular movements intact, anicteric, conjunctiva pink Mouth: oral pharynx moist, no lesions Neck: supple no lymphadenopathy Cardiovascular: heart regular rate and rhythm, no murmur Lungs: clear to auscultation bilaterally Abdomen: soft, nontender, nondistended, no obvious ascites, no peritoneal signs, normal bowel sounds, no organomegaly Rectal: Omitted Extremities: no clubbing, cyanosis, or lower extremity edema bilaterally Skin: no lesions on visible extremities Neuro: No focal deficits.  Cranial nerves intact  ASSESSMENT:  1.  Abnormal PET scan as described.  No abnormalities of the luminal GI tract. 2.  GERD.  Asymptomatic on PPI.  Previous EGD with minimal findings as described 3.  Family history of colon cancer and personal history of diminutive adenoma.  Surveillance up-to-date.  Most recent exam less than 2 years ago 4.  Obesity 5.  Prior Covid related illness 6.  General medical problems   PLAN:  1.  No indication for GI luminal endoscopy at this time.  I explained the rationale to the patient in detail.  She understands 2.  Reflux precautions with attention to weight loss 3.  Continue PPI 4.  Routine surveillance colonoscopy around May 2024.  Interval GI follow-up as needed 5.  Ongoing evaluations with pulmonary, ENT, and oncology as planned. 40 minutes spent preparing to see the patient, reviewing laboratory and x-ray test, obtaining comprehensive history, discussing the case with other consultants, performing comprehensive physical examination, counseling the patient regarding her above listed issues, and documenting clinical information in the health record

## 2019-10-13 DIAGNOSIS — G4733 Obstructive sleep apnea (adult) (pediatric): Secondary | ICD-10-CM | POA: Diagnosis not present

## 2019-10-17 DIAGNOSIS — J392 Other diseases of pharynx: Secondary | ICD-10-CM | POA: Diagnosis not present

## 2019-10-17 DIAGNOSIS — R948 Abnormal results of function studies of other organs and systems: Secondary | ICD-10-CM | POA: Diagnosis not present

## 2019-10-20 ENCOUNTER — Other Ambulatory Visit: Payer: Self-pay | Admitting: Otolaryngology

## 2019-10-20 DIAGNOSIS — R948 Abnormal results of function studies of other organs and systems: Secondary | ICD-10-CM

## 2019-10-20 DIAGNOSIS — J392 Other diseases of pharynx: Secondary | ICD-10-CM

## 2019-10-21 DIAGNOSIS — R0989 Other specified symptoms and signs involving the circulatory and respiratory systems: Secondary | ICD-10-CM | POA: Diagnosis not present

## 2019-10-21 DIAGNOSIS — R948 Abnormal results of function studies of other organs and systems: Secondary | ICD-10-CM | POA: Diagnosis not present

## 2019-10-27 ENCOUNTER — Ambulatory Visit: Payer: PPO | Admitting: Pulmonary Disease

## 2019-11-10 ENCOUNTER — Ambulatory Visit
Admission: RE | Admit: 2019-11-10 | Discharge: 2019-11-10 | Disposition: A | Discharge status: Home / Self Care | Payer: PPO | Source: Ambulatory Visit | Attending: Otolaryngology | Admitting: Otolaryngology

## 2019-11-10 ENCOUNTER — Other Ambulatory Visit: Payer: Self-pay

## 2019-11-10 ENCOUNTER — Other Ambulatory Visit: Payer: Self-pay | Admitting: Family Medicine

## 2019-11-10 DIAGNOSIS — J392 Other diseases of pharynx: Secondary | ICD-10-CM

## 2019-11-10 DIAGNOSIS — R221 Localized swelling, mass and lump, neck: Secondary | ICD-10-CM | POA: Diagnosis not present

## 2019-11-10 DIAGNOSIS — R948 Abnormal results of function studies of other organs and systems: Secondary | ICD-10-CM

## 2019-11-10 MED ORDER — IOPAMIDOL (ISOVUE-300) INJECTION 61%
75.0000 mL | Freq: Once | INTRAVENOUS | Status: AC | PRN
  Administered 2019-11-10: 75 mL via INTRAVENOUS

## 2019-11-12 ENCOUNTER — Other Ambulatory Visit: Payer: Self-pay | Admitting: *Deleted

## 2019-11-12 DIAGNOSIS — G4733 Obstructive sleep apnea (adult) (pediatric): Secondary | ICD-10-CM | POA: Diagnosis not present

## 2019-11-12 MED ORDER — TRAMADOL HCL 50 MG PO TABS: ORAL_TABLET | ORAL | 0 refills | Status: DC

## 2019-11-12 NOTE — Telephone Encounter (Signed)
Sent. Thanks.   

## 2019-11-12 NOTE — Telephone Encounter (Signed)
Last office visit 08/22/2019 for CPE.  Last refilled 09/23/2019 for #30 with 2 refills at [facility].  This refill request is coming from [facility].  No future appointments.  Ok to refill?

## 2019-11-13 ENCOUNTER — Other Ambulatory Visit: Payer: Self-pay | Admitting: Family Medicine

## 2019-11-18 DIAGNOSIS — J392 Other diseases of pharynx: Secondary | ICD-10-CM | POA: Diagnosis not present

## 2019-11-18 DIAGNOSIS — J343 Hypertrophy of nasal turbinates: Secondary | ICD-10-CM | POA: Diagnosis not present

## 2019-12-13 DIAGNOSIS — G4733 Obstructive sleep apnea (adult) (pediatric): Secondary | ICD-10-CM | POA: Diagnosis not present

## 2019-12-15 ENCOUNTER — Encounter: Payer: Self-pay | Admitting: Family Medicine

## 2019-12-15 ENCOUNTER — Other Ambulatory Visit: Payer: Self-pay

## 2019-12-15 ENCOUNTER — Ambulatory Visit (INDEPENDENT_AMBULATORY_CARE_PROVIDER_SITE_OTHER): Payer: PPO | Admitting: Family Medicine

## 2019-12-15 DIAGNOSIS — M545 Low back pain, unspecified: Secondary | ICD-10-CM

## 2019-12-15 MED ORDER — TIZANIDINE HCL 2 MG PO CAPS: 2.0000 mg | ORAL_CAPSULE | Freq: Three times a day (TID) | ORAL | 0 refills | Status: DC | PRN

## 2019-12-15 NOTE — Patient Instructions (Addendum)
Check with your insurance to see if they will cover the shingrix shot.  Try heat vs ice, see which helps better.    Gently stretch and try tizanidine for pain.    Let me know if that isn't helping.   Take care.  Glad to see you.

## 2019-12-15 NOTE — Progress Notes (Signed)
This visit occurred during the SARS-CoV-2 public health emergency.  Safety protocols were in place, including screening questions prior to the visit, additional usage of staff PPE, and extensive cleaning of exam room while observing appropriate contact time as indicated for disinfecting solutions.  She has "a catch in my back. I move a certain way and then it hurts. Going on for about 3 weeks."  Taking tramadol and tylenol prn.  Progressive pain.  Some time w/o pain.  Pain with walking/turning.  R sided, lower back, not midline.  Always in the same place when present.  No L sided pain.  No radiation into the legs.  No FCVD.  One episode of nausea today.  No B/B sx.    Still on crestor.  This doesn't feel like aches she had with simvastatin.    She had previous MRI and ENT evaluation done.  Discussed with patient.  IMPRESSION: 1. Hypermetabolic activity within the left upper lobe pulmonary nodule that was new on most recent imaging study, findings concerning for neoplasm, perhaps metastasis given the findings of abdominal and pelvic adenopathy with increased FDG uptake. 2. Pelvic adenopathy and retroperitoneal adenopathy with hypermetabolic features. Findings could be seen in the setting of metastatic disease or lymphoproliferative disorder. Area of greatest uptake is a left common iliac node, high in the common iliac chain and a left pelvic sidewall lymph node. 3. Nonspecific or pharyngeal activity on the right with potential soft tissue fullness, consider dedicated evaluation given this Asymmetry.  Meds, vitals, and allergies reviewed.   ROS: Per HPI unless specifically indicated in ROS section   GEN: nad, alert and oriented HEENT: ncat NECK: supple w/o LA CV: rrr.  PULM: ctab, no inc wob ABD: soft, +bs EXT: no edema SKIN: Well-perfused. Right-sided lower back slightly tender.  No midline pain.  No CVA pain.

## 2019-12-18 NOTE — Assessment & Plan Note (Signed)
This looks to be a benign muscle spasm. She can try heat vs ice, see which helps better.   Advised to gently stretch and try tizanidine for pain.  Routine cautions given to patient. She will let me know if that isn't helping.   Given her previous MRI and her previous ENT evaluation, I will route this to Dr. Valeta Harms for his input about potential biopsy.  I do not think the pain she is having today is related to the findings on her imaging MRI.

## 2019-12-22 ENCOUNTER — Ambulatory Visit: Admission: EM | Admit: 2019-12-22 | Discharge: 2019-12-22 | Discharge status: Against Medical Advice | Payer: PPO

## 2019-12-22 ENCOUNTER — Other Ambulatory Visit: Payer: Self-pay

## 2019-12-22 DIAGNOSIS — S52614A Nondisplaced fracture of right ulna styloid process, initial encounter for closed fracture: Secondary | ICD-10-CM | POA: Diagnosis not present

## 2019-12-22 DIAGNOSIS — S52531A Colles' fracture of right radius, initial encounter for closed fracture: Secondary | ICD-10-CM | POA: Diagnosis not present

## 2019-12-23 ENCOUNTER — Telehealth: Payer: Self-pay | Admitting: Cardiovascular Disease

## 2019-12-23 ENCOUNTER — Telehealth: Payer: Self-pay | Admitting: Emergency Medicine

## 2019-12-23 ENCOUNTER — Telehealth: Payer: Self-pay | Admitting: Pulmonary Disease

## 2019-12-23 ENCOUNTER — Other Ambulatory Visit: Payer: Self-pay

## 2019-12-23 DIAGNOSIS — R942 Abnormal results of pulmonary function studies: Secondary | ICD-10-CM

## 2019-12-23 DIAGNOSIS — R911 Solitary pulmonary nodule: Secondary | ICD-10-CM

## 2019-12-23 DIAGNOSIS — S52531D Colles' fracture of right radius, subsequent encounter for closed fracture with routine healing: Secondary | ICD-10-CM | POA: Diagnosis not present

## 2019-12-23 DIAGNOSIS — R918 Other nonspecific abnormal finding of lung field: Secondary | ICD-10-CM

## 2019-12-23 NOTE — Telephone Encounter (Signed)
appt has been scheduled for pt with Beth on 7/23. Order has also been placed for the CT. Nothing further needed.

## 2019-12-23 NOTE — Telephone Encounter (Signed)
   Primary Cardiologist: Ida Rogue, MD  Chart reviewed as part of pre-operative protocol coverage. Given past medical history and time since last visit, based on ACC/AHA guidelines, Kara Wright would be at acceptable risk for the planned procedure without further cardiovascular testing.   I will route this recommendation to the requesting party via Epic fax function and remove from pre-op pool.  Please call with questions. See below at Dr. Donivan Scull recommendation, I have spoken with the patient regarding the clearance as well.   [place], [place] 12/23/2019, 6:44 PM

## 2019-12-23 NOTE — Telephone Encounter (Signed)
   [facility] Pre-operative Risk Assessment    HEARTCARE STAFF: - Please ensure there is not already an duplicate clearance open for this procedure. - Under Visit Info/Reason for Call, type in Other and utilize the format Clearance MM/DD/YY or Clearance TBD. Do not use dashes or single digits. - If request is for dental extraction, please clarify the # of teeth to be extracted.  Request for surgical clearance:  1. What type of surgery is being performed? Right wrist open reduced internal fixation   2. When is this surgery scheduled? 12/26/19 - stat clearance   3. What type of clearance is required (medical clearance vs. Pharmacy clearance to hold med vs. Both)? both  4. Are there any medications that need to be held prior to surgery and how long? Xarelto - discontinue 3 days prior to procedure   5. Practice name and name of physician performing surgery? [facility] - Dr Peggye Ley   6. What is the office phone number? [phone] ext [phone]   7.   What is the office fax number? [phone] or [phone]  8.   Anesthesia type (None, local, MAC, general) ? Regional    [facility] 12/23/2019, 2:40 PM  _________________________________________________________________   (provider comments below)

## 2019-12-23 NOTE — Telephone Encounter (Signed)
Message received by Dr. Valeta Harms: Valeta Harms, Octavio Graves, DO  P [facility] Triage Pool  Can we get an appt for her to follow up in our clinic? She needs a repeat CT Chest Super D and likely bronchoscopy.   Could an APP work her in next week or the following?   Brad         Checked our APPs schedules and the first avail in-office appt for pt to see an APP is not until 7/22. Dr. Valeta Harms, please advise if you are okay with it being this far out?

## 2019-12-23 NOTE — Telephone Encounter (Signed)
Yes ok for this date Garner Nash, DO [facility] 12/23/2019 12:52 PM

## 2019-12-23 NOTE — Telephone Encounter (Signed)
Patient with diagnosis of afib on Xarelto for anticoagulation.    Procedure:  Right wrist open reduced internal fixation  Date of procedure: 12/26/19  CHADS2-VASc score of  5 (HTN, AGE, stroke/tia x 2, female)  CrCl 75 ml/min  Patient is high risk off anticoagulation due to hx of stoke. MD is requesting a 3 day hold. I will defer to Dr. Rockey Situ.

## 2019-12-23 NOTE — Telephone Encounter (Signed)
Okay to hold Xarelto 2 days, 3 if needed Would restart Xarelto at discretion of the surgeon when safe to do so

## 2019-12-23 NOTE — Telephone Encounter (Signed)
Made in error

## 2019-12-23 NOTE — Telephone Encounter (Signed)
Clinical pharmacist to review. H/o PAF and TIA/CVA.

## 2019-12-23 NOTE — Telephone Encounter (Signed)
I spoke with the patient, she denies any recent exertional chest pain or worsening dyspnea. She fell and broke her right wrist. Given the urgent nature of this surgery and lack of anginal symptom, she is cleared to proceed with surgery.   I reached out to our clinical pharmacist, unfortunately, the decision regarding anticoagulation has to be made by MD per office protocol. I am trying to reach out to Dr. Rockey Situ. For the time being, I asked the patient to start holding the Xarelto.

## 2019-12-26 DIAGNOSIS — S52531D Colles' fracture of right radius, subsequent encounter for closed fracture with routine healing: Secondary | ICD-10-CM | POA: Diagnosis not present

## 2019-12-26 DIAGNOSIS — Z79890 Hormone replacement therapy (postmenopausal): Secondary | ICD-10-CM | POA: Diagnosis not present

## 2019-12-26 DIAGNOSIS — M25531 Pain in right wrist: Secondary | ICD-10-CM | POA: Diagnosis not present

## 2019-12-26 DIAGNOSIS — Z886 Allergy status to analgesic agent status: Secondary | ICD-10-CM | POA: Diagnosis not present

## 2019-12-26 DIAGNOSIS — Z6841 Body Mass Index (BMI) 40.0 and over, adult: Secondary | ICD-10-CM | POA: Diagnosis not present

## 2019-12-26 DIAGNOSIS — G4733 Obstructive sleep apnea (adult) (pediatric): Secondary | ICD-10-CM | POA: Diagnosis not present

## 2019-12-26 DIAGNOSIS — E785 Hyperlipidemia, unspecified: Secondary | ICD-10-CM | POA: Diagnosis not present

## 2019-12-26 DIAGNOSIS — Z8673 Personal history of transient ischemic attack (TIA), and cerebral infarction without residual deficits: Secondary | ICD-10-CM | POA: Diagnosis not present

## 2019-12-26 DIAGNOSIS — G8918 Other acute postprocedural pain: Secondary | ICD-10-CM | POA: Diagnosis not present

## 2019-12-26 DIAGNOSIS — I4891 Unspecified atrial fibrillation: Secondary | ICD-10-CM | POA: Diagnosis not present

## 2019-12-26 DIAGNOSIS — Z8619 Personal history of other infectious and parasitic diseases: Secondary | ICD-10-CM | POA: Diagnosis not present

## 2019-12-26 DIAGNOSIS — S52591A Other fractures of lower end of right radius, initial encounter for closed fracture: Secondary | ICD-10-CM | POA: Diagnosis not present

## 2019-12-26 DIAGNOSIS — K219 Gastro-esophageal reflux disease without esophagitis: Secondary | ICD-10-CM | POA: Diagnosis not present

## 2019-12-26 DIAGNOSIS — T40605A Adverse effect of unspecified narcotics, initial encounter: Secondary | ICD-10-CM | POA: Diagnosis not present

## 2019-12-26 DIAGNOSIS — S52571A Other intraarticular fracture of lower end of right radius, initial encounter for closed fracture: Secondary | ICD-10-CM | POA: Diagnosis not present

## 2019-12-26 DIAGNOSIS — Z85828 Personal history of other malignant neoplasm of skin: Secondary | ICD-10-CM | POA: Diagnosis not present

## 2019-12-26 DIAGNOSIS — R0902 Hypoxemia: Secondary | ICD-10-CM | POA: Diagnosis not present

## 2019-12-26 DIAGNOSIS — H579 Unspecified disorder of eye and adnexa: Secondary | ICD-10-CM | POA: Diagnosis not present

## 2019-12-26 DIAGNOSIS — Z888 Allergy status to other drugs, medicaments and biological substances status: Secondary | ICD-10-CM | POA: Diagnosis not present

## 2019-12-26 DIAGNOSIS — G5601 Carpal tunnel syndrome, right upper limb: Secondary | ICD-10-CM | POA: Diagnosis not present

## 2019-12-26 DIAGNOSIS — E662 Morbid (severe) obesity with alveolar hypoventilation: Secondary | ICD-10-CM | POA: Diagnosis not present

## 2019-12-26 DIAGNOSIS — H9190 Unspecified hearing loss, unspecified ear: Secondary | ICD-10-CM | POA: Diagnosis not present

## 2019-12-26 DIAGNOSIS — Z7901 Long term (current) use of anticoagulants: Secondary | ICD-10-CM | POA: Diagnosis not present

## 2019-12-26 DIAGNOSIS — Z91048 Other nonmedicinal substance allergy status: Secondary | ICD-10-CM | POA: Diagnosis not present

## 2019-12-26 DIAGNOSIS — Z8616 Personal history of COVID-19: Secondary | ICD-10-CM | POA: Diagnosis not present

## 2019-12-26 DIAGNOSIS — S52571B Other intraarticular fracture of lower end of right radius, initial encounter for open fracture type I or II: Secondary | ICD-10-CM | POA: Diagnosis not present

## 2019-12-26 DIAGNOSIS — E039 Hypothyroidism, unspecified: Secondary | ICD-10-CM | POA: Diagnosis not present

## 2019-12-26 DIAGNOSIS — I4819 Other persistent atrial fibrillation: Secondary | ICD-10-CM | POA: Diagnosis not present

## 2019-12-26 DIAGNOSIS — J449 Chronic obstructive pulmonary disease, unspecified: Secondary | ICD-10-CM | POA: Diagnosis not present

## 2019-12-27 DIAGNOSIS — I4891 Unspecified atrial fibrillation: Secondary | ICD-10-CM | POA: Diagnosis not present

## 2019-12-27 DIAGNOSIS — R0902 Hypoxemia: Secondary | ICD-10-CM | POA: Diagnosis not present

## 2019-12-27 DIAGNOSIS — K219 Gastro-esophageal reflux disease without esophagitis: Secondary | ICD-10-CM | POA: Diagnosis not present

## 2019-12-27 DIAGNOSIS — Z8673 Personal history of transient ischemic attack (TIA), and cerebral infarction without residual deficits: Secondary | ICD-10-CM | POA: Diagnosis not present

## 2019-12-27 DIAGNOSIS — G4733 Obstructive sleep apnea (adult) (pediatric): Secondary | ICD-10-CM | POA: Diagnosis not present

## 2020-01-01 DIAGNOSIS — S52531D Colles' fracture of right radius, subsequent encounter for closed fracture with routine healing: Secondary | ICD-10-CM | POA: Diagnosis not present

## 2020-01-01 NOTE — Progress Notes (Signed)
Super D scheduled for 7/19 OV is 7/23

## 2020-01-05 ENCOUNTER — Other Ambulatory Visit: Payer: Self-pay

## 2020-01-05 ENCOUNTER — Ambulatory Visit (INDEPENDENT_AMBULATORY_CARE_PROVIDER_SITE_OTHER)
Admission: RE | Admit: 2020-01-05 | Discharge: 2020-01-05 | Disposition: A | Discharge status: Home / Self Care | Payer: PPO | Source: Ambulatory Visit | Attending: Pulmonary Disease | Admitting: Pulmonary Disease

## 2020-01-05 DIAGNOSIS — R918 Other nonspecific abnormal finding of lung field: Secondary | ICD-10-CM

## 2020-01-05 DIAGNOSIS — R942 Abnormal results of pulmonary function studies: Secondary | ICD-10-CM

## 2020-01-05 DIAGNOSIS — R911 Solitary pulmonary nodule: Secondary | ICD-10-CM

## 2020-01-05 DIAGNOSIS — I700 Atherosclerosis of aorta: Secondary | ICD-10-CM | POA: Diagnosis not present

## 2020-01-05 DIAGNOSIS — I2510 Atherosclerotic heart disease of native coronary artery without angina pectoris: Secondary | ICD-10-CM | POA: Diagnosis not present

## 2020-01-05 DIAGNOSIS — I281 Aneurysm of pulmonary artery: Secondary | ICD-10-CM | POA: Diagnosis not present

## 2020-01-05 DIAGNOSIS — I2721 Secondary pulmonary arterial hypertension: Secondary | ICD-10-CM | POA: Diagnosis not present

## 2020-01-06 ENCOUNTER — Telehealth: Payer: Self-pay | Admitting: Pulmonary Disease

## 2020-01-06 DIAGNOSIS — G4733 Obstructive sleep apnea (adult) (pediatric): Secondary | ICD-10-CM | POA: Diagnosis not present

## 2020-01-06 DIAGNOSIS — J449 Chronic obstructive pulmonary disease, unspecified: Secondary | ICD-10-CM | POA: Diagnosis not present

## 2020-01-06 NOTE — Telephone Encounter (Signed)
Thanks

## 2020-01-06 NOTE — Telephone Encounter (Signed)
Patient had recent hospital visit she states that she was told only to use o2 despite the dx of serve  Sleep apnea. Patient wanted to talk to a Doctor about this she has a appointment on 01/09/20 with Beth Np.  She stated she would still use it till she see the NP.   Sending to beth as Juluis Rainier

## 2020-01-08 DIAGNOSIS — Z09 Encounter for follow-up examination after completed treatment for conditions other than malignant neoplasm: Secondary | ICD-10-CM | POA: Diagnosis not present

## 2020-01-08 DIAGNOSIS — S52531D Colles' fracture of right radius, subsequent encounter for closed fracture with routine healing: Secondary | ICD-10-CM | POA: Diagnosis not present

## 2020-01-09 ENCOUNTER — Encounter: Payer: Self-pay | Admitting: Primary Care

## 2020-01-09 ENCOUNTER — Ambulatory Visit: Payer: PPO | Admitting: Primary Care

## 2020-01-09 ENCOUNTER — Other Ambulatory Visit: Payer: Self-pay

## 2020-01-09 ENCOUNTER — Other Ambulatory Visit: Payer: Self-pay | Admitting: Family Medicine

## 2020-01-09 VITALS — BP 137/80 | HR 63 | Temp 97.3°F | Ht 64.0 in | Wt 237.8 lb

## 2020-01-09 DIAGNOSIS — R0602 Shortness of breath: Secondary | ICD-10-CM | POA: Diagnosis not present

## 2020-01-09 DIAGNOSIS — R918 Other nonspecific abnormal finding of lung field: Secondary | ICD-10-CM | POA: Diagnosis not present

## 2020-01-09 DIAGNOSIS — G4733 Obstructive sleep apnea (adult) (pediatric): Secondary | ICD-10-CM | POA: Diagnosis not present

## 2020-01-09 DIAGNOSIS — G4730 Sleep apnea, unspecified: Secondary | ICD-10-CM | POA: Diagnosis not present

## 2020-01-09 DIAGNOSIS — R911 Solitary pulmonary nodule: Secondary | ICD-10-CM | POA: Diagnosis not present

## 2020-01-09 DIAGNOSIS — Z9989 Dependence on other enabling machines and devices: Secondary | ICD-10-CM

## 2020-01-09 NOTE — Progress Notes (Signed)
@Patient  ID: Kara Wright, female    DOB: 04-17-1946, 74 y.o.   MRN: [id number]  Chief Complaint  Patient presents with  . Follow-up    osa on cpap    Referring provider: Tonia Ghent, MD  HPI: 74 year old female, former smoker quit 1988 (20 pack year hx)  Past medical history significant for multiple pulmonary nodules, A. Fib (on xarelto), coronary artery disease, hypertension, TIA, GERD, esophagitis, hypothyroidism, osteoporosis.  Patient followed with Dr. Ander Slade for sleep p, last seen on 06/04/19. AND  Dr. Valeta Harms for multiple pulmonary nodules, last seen on 12/18/18.  CT imaging that revealed stable nodule in the LUL and new nodule in the RLL. Unfortunately she canceled this appointment in April.   Previous [facility] pulmonary encounters: 06/03/20- DR. OLALERE Recently started using CPAP Seen significant benefit in CPAP use She does have some nasal stuffiness, sneezing -We talked about using Nasonex or Flonase for nasal stuffiness and congestion  Recently had a mask change, continues to help -Adequately controlled on CPAP therapy  12/18/18- Dr. Valeta Harms OV 12/18/2018: Patient seen here today for follow-up regarding multiple pulmonary nodules.  Patient had CT imaging of the chest completed on 12/13/2018 for follow-up regarding her nodules.  There is a new small 5 mm nodule in the right pulmonary apex a new 4 mm nodule in the anterior left upper lobe prior nodules either resolved or stable. Today, she states that she has breathing better.  She does have some dyspnea on exertion.  She was unable to get her sleep appointment scheduled.  We will try again for this.  She was very anxious about having the CT scan for the follow-up of the nodules.  She is relieved to hear that they are smaller.  She does understand there is a few new ones that need follow-up.  Otherwise doing well.  She had PFTs completed prior to today's office which which we reviewed.  It does reveal a reduced FEV1 and FVC with a  reserved ratio and a reduced ERV of 10%.  DLCO of 74%.  This is likely related to her weight.  BMI of 40.  We discussed this today in the office as well.  She is trying to watch her weight and her dietary intake.  She does understand importance of losing weight.   01/09/2020 - interim hx Patient presents today for regular follow-up visit.  Shortness of breath and fatigue symptoms have not recovered since getting covid. Smell and taste have returned. She experiences dyspnea on exertion and has occasional upper airway congestion/cough. PFTs showed no evidence of obstruction or BD response. Restriction felt to be d/t body habitus. She is working on weight loss by avoiding sugar/sweets and soda. She is wanting to know if she has to wear CPAP, while she was in the hospital for right wrist surgery she wore 1L oxygen instead. States that when she got home from hospital her CPAP stopped working. Over the last several days she has only been wearing 1L oxygen at night. DME company is [employer].    [employer] download 11/29/19-12/28/19 27/30 days used; 77% greater than 4 hours Average usage 6 hours 1 minute Pressure 5-18 cm H2O (9.8 cm H2O-95 percentile) Air leaks 34 L/min AHI 3.4  Imaging: 01/05/20 CT chest Super-D- Waxing and waning bilateral pulmonary nodules, some new, some stable, some decreased and some resolved as detailed. The largest pulmonary nodule on today's scan is new and measures 0.7 cm in the basilar right lower lobe. Findings are likely  infectious/inflammatory, with the differential including pulmonary vasculitis.Dilated main pulmonary artery, suggesting pulmonary arterial hypertension.Three-vessel coronary atherosclerosis. Aortic Atherosclerosis (ICD10-I70.0).  Allergies  Allergen Reactions  . Celebrex [Celecoxib]     REACTION: nausea: GI UPSET  . Doxycycline Other (See Comments)    GI upset- not an allergy.    . Gold-Containing Drug Products   . Nickel     Skin rash, allergy  .  Other     Fragrance, phenylenediamine, contrast metal agents, gold  . Sodium Metabisulfite Other (See Comments)    Positive patch test  . Zocor [Simvastatin]     Aches but tolerates crestor    Immunization History  Administered Date(s) Administered  . Fluad Quad(high Dose 65+) 03/21/2019  . Influenza Split 04/13/2011, 04/26/2012  . Influenza Whole 04/07/2009, 04/13/2010  . Influenza, High Dose Seasonal PF 04/09/2017, 04/18/2018  . Influenza,inj,Quad PF,6+ Mos 05/01/2013, 04/30/2014, 04/05/2015, 06/05/2016  . PFIZER SARS-COV-2 Vaccination 08/06/2019, 08/27/2019  . Pneumococcal Conjugate-13 08/06/2014  . Pneumococcal Polysaccharide-23 07/16/2012  . Td 04/04/2006, 02/20/2012  . Zoster 05/02/2007    Past Medical History:  Diagnosis Date  . Atrial fib/flutter, transient    1 episode, resolved, never on coumadin  . Cataract 2019   left eye  . Clotting disorder ([place])    on xarelto  . Diverticulosis 03/1997  . Dysrhythmia    atrial fibrilation  . GERD (gastroesophageal reflux disease) 1997   good on prilosec  . Hepatitis    hx of hep B (late 1970s)  . Hyperlipidemia 08/1996  . Hypothyroidism 1984  . Migraine    w/o aura  . Osteopenia 11/2002   via dexa  . Presence of dental prosthetic device    temporary while waiting for implant  . Skin cancer    skin cancer  . Stroke [facility]) 08/2011   TIA    Tobacco History: Social History   Tobacco Use  Smoking Status Former Smoker  . Packs/day: 1.00  . Quit date: 06/26/1986  . Years since quitting: 33.5  Smokeless Tobacco Never Used   Counseling given: Not Answered   Outpatient Medications Prior to Visit  Medication Sig Dispense Refill  . albuterol (VENTOLIN HFA) 108 (90 Base) MCG/ACT inhaler Inhale 2 puffs into the lungs every 6 (six) hours as needed for wheezing or shortness of breath. 8 g 3  . Cholecalciferol (VITAMIN D3) 50 MCG (2000 UT) capsule Take 2 capsules (4,000 Units total) by mouth daily. 180 capsule 3  . diltiazem  (CARDIZEM) 30 MG tablet TAKE ONE TABLET BY MOUTH THREE TIMES DAILY AS NEEDED 90 tablet 1  . diphenhydrAMINE (BENADRYL) 25 MG tablet Take 25 mg by mouth every 8 (eight) hours as needed for itching.    Marland Kitchen HYDROcodone-acetaminophen (NORCO/VICODIN) 5-325 MG tablet hydrocodone 5 mg-acetaminophen 325 mg tablet  TAKE 1 TABLET BY MOUTH EVERY 6 HOURS AS NEEDED    . ibuprofen (ADVIL) 600 MG tablet Take 600 mg by mouth 3 (three) times daily.    Marland Kitchen levothyroxine (SYNTHROID) 150 MCG tablet Take 1 tablet (150 mcg total) by mouth daily. Except for 1.5 on Sunday 100 tablet 3  . loperamide (IMODIUM A-D) 2 MG tablet Take 2 mg by mouth 2 (two) times daily as needed for diarrhea or loose stools.    Marland Kitchen omeprazole (PRILOSEC) 20 MG capsule Take 1 capsule (20 mg total) by mouth daily. 90 capsule 3  . ondansetron (ZOFRAN-ODT) 4 MG disintegrating tablet ondansetron 4 mg disintegrating tablet  DISSOLVE 1 TABLET IN MOUTH EVERY 12 HOURS AS NEEDED FOR  NAUSEA    . propranolol (INNOPRAN XL) 120 MG 24 hr capsule Take by mouth.    . propranolol ER (INDERAL LA) 120 MG 24 hr capsule Take 1 capsule (120 mg total) by mouth daily. 90 capsule 3  . rivaroxaban (XARELTO) 20 MG TABS tablet TAKE 1 TABLET BY MOUTH ONCE DAILY WITH  SUPPER 90 tablet 3  . rosuvastatin (CRESTOR) 5 MG tablet TAKE ONE TABLET BY MOUTH AT BEDTIME 90 tablet 2  . traMADol (ULTRAM) 50 MG tablet TAKE 1 TABLET BY MOUTH EVERY 8 HOURS AS NEEDED...CAUTION SEDATION 90 tablet 0  . promethazine (PHENERGAN) 25 MG tablet Take 1 tablet (25 mg total) by mouth every 6 (six) hours as needed. for nausea 30 tablet 3  . gabapentin (NEURONTIN) 100 MG capsule 100 mg at bedtime.     . tizanidine (ZANAFLEX) 2 MG capsule Take 1 capsule (2 mg total) by mouth 3 (three) times daily as needed for muscle spasms. Sedation caution 30 capsule 0   No facility-administered medications prior to visit.   Review of Systems  Review of Systems  Respiratory: Positive for shortness of breath.      Physical Exam  BP (!) 137/80 (BP Location: Left Arm, Cuff Size: Normal)   Pulse 63   Temp (!) 97.3 F (36.3 C) (Oral)   Ht 5\' 4"  (1.626 m)   Wt (!) 237 lb 12.8 oz (107.9 kg)   SpO2 93%   BMI 40.82 kg/m  Physical Exam Constitutional:      Appearance: Normal appearance.  HENT:     Head: Normocephalic and atraumatic.  Cardiovascular:     Rate and Rhythm: Normal rate and regular rhythm.  Pulmonary:     Effort: Pulmonary effort is normal.     Breath sounds: Normal breath sounds.     Comments: CTA Neurological:     General: No focal deficit present.     Mental Status: She is alert and oriented to person, place, and time. Mental status is at baseline.  Psychiatric:        Mood and Affect: Mood normal.        Behavior: Behavior normal.        Thought Content: Thought content normal.        Judgment: Judgment normal.      Lab Results:  CBC    Component Value Date/Time   WBC 4.8 08/13/2019 1011   RBC 4.78 08/13/2019 1011   HGB 12.5 08/13/2019 1011   HGB 13.8 08/26/2011 1334   HCT 38.7 08/13/2019 1011   HCT 41.2 08/26/2011 1334   PLT 140.0 (L) 08/13/2019 1011   PLT 156 08/26/2011 1334   MCV 81.0 08/13/2019 1011   MCV 83 08/26/2011 1334   MCH 27.1 10/28/2016 0246   MCHC 32.2 08/13/2019 1011   RDW 15.1 08/13/2019 1011   RDW 13.8 08/26/2011 1334   LYMPHSABS 1.8 08/13/2019 1011   MONOABS 0.4 08/13/2019 1011   EOSABS 0.1 08/13/2019 1011   BASOSABS 0.0 08/13/2019 1011    BMET    Component Value Date/Time   NA 142 08/13/2019 1011   NA 139 08/26/2011 1334   K 3.9 08/13/2019 1011   K 3.8 08/26/2011 1334   CL 104 08/13/2019 1011   CL 100 08/26/2011 1334   CO2 31 08/13/2019 1011   CO2 26 08/26/2011 1334   GLUCOSE 109 (H) 08/13/2019 1011   GLUCOSE 129 (H) 08/26/2011 1334   BUN 7 08/13/2019 1011   BUN 12 08/26/2011 1334   CREATININE  0.65 08/13/2019 1011   CREATININE 0.80 08/26/2011 1334   CALCIUM 8.8 08/13/2019 1011   CALCIUM 9.2 08/26/2011 1334   GFRNONAA  >60 10/28/2016 0246   GFRNONAA >60 08/26/2011 1334   GFRAA >60 10/28/2016 0246   GFRAA >60 08/26/2011 1334    BNP    Component Value Date/Time   BNP 60.0 10/26/2016 1314    ProBNP No results found for: PROBNP  Imaging: CT Super D Chest Wo Contrast  Result Date: 01/05/2020 CLINICAL DATA:  New bilateral pulmonary nodules, largest in the left upper lobe is hypermetabolic. Hypermetabolic retroperitoneal and pelvic lymphadenopathy. EXAM: CT CHEST WITHOUT CONTRAST TECHNIQUE: Multidetector CT imaging of the chest was performed using thin slice collimation for electromagnetic bronchoscopy planning purposes, without intravenous contrast. COMPARISON:  09/02/2019 high-resolution chest CT. 09/11/2019 PET-CT. FINDINGS: Cardiovascular: Normal heart size. No significant pericardial effusion/thickening. Three-vessel coronary atherosclerosis. Atherosclerotic aneurysmal thoracic aorta. Dilated main pulmonary artery (3.8 cm diameter). Mediastinum/Nodes: Thyroid either surgically absent or atrophic. Unremarkable esophagus. No pathologically enlarged axillary, mediastinal or hilar lymph nodes, noting limited sensitivity for the detection of hilar adenopathy on this noncontrast study. Lungs/Pleura: No pneumothorax. No pleural effusion. No acute consolidative airspace disease or lung masses. There are numerous waxing and waning bilateral pulmonary nodules. For example, previously visualized basilar left lower lobe 0.9 cm pulmonary nodule on 09/02/2019 chest CT has resolved. Separate previously visualized 1.2 cm left lower lobe pulmonary nodule is decreased to 0.5 cm and is now ground-glass in density (series 3/image 105). Previously visualized peripheral left upper lobe 1.1 cm pulmonary nodule is decreased to 0.3 cm and is now ground-glass in density (series 3/image 45). Several of the pulmonary nodules on today's scan are new, largest 0.7 cm in the basilar right lower lobe (series 3/image 109) and 0.5 cm in the basilar  left lower lobe (series 3/image 113). Some of the pulmonary nodules are stable, for example 0.6 cm in the left upper lobe (series 3/image 47), previously 0.6 cm using similar measurement technique. Upper abdomen: Cholecystectomy. Musculoskeletal: No aggressive appearing focal osseous lesions. Moderate thoracic spondylosis. IMPRESSION: 1. Waxing and waning bilateral pulmonary nodules, some new, some stable, some decreased and some resolved as detailed. The largest pulmonary nodule on today's scan is new and measures 0.7 cm in the basilar right lower lobe. Findings are likely infectious/inflammatory, with the differential including pulmonary vasculitis. Suggest attention on follow-up chest CT in 3-6 months. 2. Dilated main pulmonary artery, suggesting pulmonary arterial hypertension. 3. Three-vessel coronary atherosclerosis. 4. Aortic Atherosclerosis (ICD10-I70.0). Electronically Signed   By: Ilona Sorrel M.D.   On: 01/05/2020 14:40     Assessment & Plan:   SOB (shortness of breath) - PFTs showed no evidence of obstruction or BD response. Restriction felt to be d/t body habitus.  Sleep apnea - Patient is 77% compliant with CPAP use > 4 hours; She is wondering if she still needs to wear CPAP mask, recently only wearing nocturnal oxygen   - No changes to pressure settings today - Recommend you contact [facility] about fixing CPAP machine/getting replacement - Continue wearing CPAP every night for 4 to 6 hours or more - Do not drive if experiencing excessive daytime fatigue or somnolence - We will check an overnight oximetry test while wearing CPAP machine to determine if you need to keep your oxygen   Pulmonary nodule - 01/05/20 CT chest/Super D showed waxing and waning pulmonary nodules, largest measuring 38mm RLL - We will repeat a CT chest in 6 months to monitor this and  also check labs today to rule out underlying vasculitis or connective tissue disease - Labs on 01/09/20- ANCA and ANA  negative, sed rate 92, CRP normal  - FU with Dr. Valeta Harms after repeat CT chest in 6 months      Martyn Ehrich, NP 01/19/2020

## 2020-01-09 NOTE — Patient Instructions (Addendum)
  Sleep apnea: - No changes to pressure settings today - Recommend you contact [facility] about fixing CPAP machine/getting replacement - Continue wearing CPAP every night for 4 to 6 hours or more - Do not drive if experiencing excessive daytime fatigue or somnolence - We will check an overnight oximetry test while wearing CPAP machine to determine if you need to keep your oxygen  Pulmonary nodules: - CT scan of chest showed that some your pulmonary nodules have resolved, some have decreased in size and some new ones have formed.   - We will repeat a CT chest in 6 months to monitor this and also check labs today to rule out underlying vasculitis or connective tissue disease  Orders - Labs today - Ono on CPAP  Follow-up: - 6 months with Dr. Valeta Harms (after CT chest)

## 2020-01-09 NOTE — Telephone Encounter (Signed)
Refill request Phenergan Last office visit 12/15/19 Last refill 08/22/19 #30/3

## 2020-01-11 NOTE — Telephone Encounter (Signed)
Sent. Thanks.   

## 2020-01-12 ENCOUNTER — Telehealth: Payer: Self-pay | Admitting: Primary Care

## 2020-01-12 DIAGNOSIS — G4733 Obstructive sleep apnea (adult) (pediatric): Secondary | ICD-10-CM | POA: Diagnosis not present

## 2020-01-12 LAB — SEDIMENTATION RATE: Sed Rate: 92 mm/h — ABNORMAL HIGH (ref 0–30)

## 2020-01-12 LAB — ANA: Anti Nuclear Antibody (ANA): NEGATIVE

## 2020-01-12 LAB — C-REACTIVE PROTEIN: CRP: 1.4 mg/L (ref <8.0)

## 2020-01-12 LAB — ANCA SCREEN W REFLEX TITER: ANCA Screen: NEGATIVE

## 2020-01-12 NOTE — Telephone Encounter (Signed)
Leah from [employer] calling for Magda Paganini - RX needs to say on room air or CPAP - just specify on how to test - [phone]

## 2020-01-12 NOTE — Telephone Encounter (Signed)
Order placed for ONO on CPAP with [facility]

## 2020-01-12 NOTE — Telephone Encounter (Signed)
ONO should be on CPAP according to Beth's note LMTCB for [facility]

## 2020-01-15 DIAGNOSIS — S52531D Colles' fracture of right radius, subsequent encounter for closed fracture with routine healing: Secondary | ICD-10-CM | POA: Diagnosis not present

## 2020-01-19 ENCOUNTER — Encounter: Payer: Self-pay | Admitting: Primary Care

## 2020-01-19 DIAGNOSIS — R911 Solitary pulmonary nodule: Secondary | ICD-10-CM | POA: Insufficient documentation

## 2020-01-19 DIAGNOSIS — G4730 Sleep apnea, unspecified: Secondary | ICD-10-CM | POA: Insufficient documentation

## 2020-01-19 NOTE — Assessment & Plan Note (Addendum)
-   01/05/20 CT chest/Super D showed waxing and waning pulmonary nodules, largest measuring 23mm RLL - We will repeat a CT chest in 6 months to monitor this and also check labs today to rule out underlying vasculitis or connective tissue disease - Labs on 01/09/20- ANCA and ANA negative, sed rate 92, CRP normal  - FU with Dr. Valeta Harms after repeat CT chest in 6 months

## 2020-01-19 NOTE — Assessment & Plan Note (Signed)
-   Patient is 77% compliant with CPAP use > 4 hours; She is wondering if she still needs to wear CPAP mask, recently only wearing nocturnal oxygen   - No changes to pressure settings today - Recommend you contact [facility] about fixing CPAP machine/getting replacement - Continue wearing CPAP every night for 4 to 6 hours or more - Do not drive if experiencing excessive daytime fatigue or somnolence - We will check an overnight oximetry test while wearing CPAP machine to determine if you need to keep your oxygen

## 2020-01-19 NOTE — Assessment & Plan Note (Signed)
-   PFTs showed no evidence of obstruction or BD response. Restriction felt to be d/t body habitus.

## 2020-01-20 DIAGNOSIS — R0683 Snoring: Secondary | ICD-10-CM | POA: Diagnosis not present

## 2020-01-20 DIAGNOSIS — G4730 Sleep apnea, unspecified: Secondary | ICD-10-CM | POA: Diagnosis not present

## 2020-01-22 DIAGNOSIS — S52531D Colles' fracture of right radius, subsequent encounter for closed fracture with routine healing: Secondary | ICD-10-CM | POA: Diagnosis not present

## 2020-01-27 ENCOUNTER — Telehealth: Payer: Self-pay

## 2020-01-27 NOTE — Telephone Encounter (Signed)
Allegra Lai with [employer] left v/m requesting status of CMN for Oxygen that was faxed to [facility]. Mariann Laster request cb.

## 2020-01-29 DIAGNOSIS — S52531D Colles' fracture of right radius, subsequent encounter for closed fracture with routine healing: Secondary | ICD-10-CM | POA: Diagnosis not present

## 2020-01-29 NOTE — Telephone Encounter (Signed)
Form faxed and sent for scanning.

## 2020-01-29 NOTE — Telephone Encounter (Signed)
Form done. Thanks. 

## 2020-01-30 NOTE — Telephone Encounter (Signed)
Please let patient know ONO showed that patient send 8 hours 11 mins with O2 <88%. SpO2 low 62%, baseline 83%. She still needs to wear OXYGEN with CPAP. Recommend in-lab CPAP titration study for oxygen please.

## 2020-01-30 NOTE — Addendum Note (Signed)
Addended by: Lia Foyer R on: 01/30/2020 04:17 PM   Modules accepted: Orders

## 2020-01-30 NOTE — Telephone Encounter (Signed)
Called and spoke with patient letting her know that her ONO showed that she still needs to wear her oxygen with her CPAP and that we are going to place an order for in-lab CPAP titration study for oxygen. Told her that they would call her to get her set up. Order placed.  She expressed understanding. Nothing further needed at this time

## 2020-02-02 DIAGNOSIS — S52531D Colles' fracture of right radius, subsequent encounter for closed fracture with routine healing: Secondary | ICD-10-CM | POA: Diagnosis not present

## 2020-02-10 DIAGNOSIS — S52531D Colles' fracture of right radius, subsequent encounter for closed fracture with routine healing: Secondary | ICD-10-CM | POA: Diagnosis not present

## 2020-02-11 DIAGNOSIS — D485 Neoplasm of uncertain behavior of skin: Secondary | ICD-10-CM | POA: Diagnosis not present

## 2020-02-11 DIAGNOSIS — L820 Inflamed seborrheic keratosis: Secondary | ICD-10-CM | POA: Diagnosis not present

## 2020-02-11 DIAGNOSIS — L538 Other specified erythematous conditions: Secondary | ICD-10-CM | POA: Diagnosis not present

## 2020-02-11 DIAGNOSIS — L570 Actinic keratosis: Secondary | ICD-10-CM | POA: Diagnosis not present

## 2020-02-11 DIAGNOSIS — Z85828 Personal history of other malignant neoplasm of skin: Secondary | ICD-10-CM | POA: Diagnosis not present

## 2020-02-11 DIAGNOSIS — D225 Melanocytic nevi of trunk: Secondary | ICD-10-CM | POA: Diagnosis not present

## 2020-02-11 DIAGNOSIS — L821 Other seborrheic keratosis: Secondary | ICD-10-CM | POA: Diagnosis not present

## 2020-02-11 DIAGNOSIS — D2261 Melanocytic nevi of right upper limb, including shoulder: Secondary | ICD-10-CM | POA: Diagnosis not present

## 2020-02-11 DIAGNOSIS — D2262 Melanocytic nevi of left upper limb, including shoulder: Secondary | ICD-10-CM | POA: Diagnosis not present

## 2020-02-11 DIAGNOSIS — L439 Lichen planus, unspecified: Secondary | ICD-10-CM | POA: Diagnosis not present

## 2020-02-12 DIAGNOSIS — G4733 Obstructive sleep apnea (adult) (pediatric): Secondary | ICD-10-CM | POA: Diagnosis not present

## 2020-02-24 DIAGNOSIS — S52531D Colles' fracture of right radius, subsequent encounter for closed fracture with routine healing: Secondary | ICD-10-CM | POA: Diagnosis not present

## 2020-03-03 ENCOUNTER — Other Ambulatory Visit: Payer: Self-pay | Admitting: Family Medicine

## 2020-03-03 DIAGNOSIS — Z1231 Encounter for screening mammogram for malignant neoplasm of breast: Secondary | ICD-10-CM

## 2020-03-08 ENCOUNTER — Ambulatory Visit (HOSPITAL_BASED_OUTPATIENT_CLINIC_OR_DEPARTMENT_OTHER): Payer: PPO | Attending: Primary Care | Admitting: Pulmonary Disease

## 2020-03-08 ENCOUNTER — Other Ambulatory Visit: Payer: Self-pay

## 2020-03-08 DIAGNOSIS — G4733 Obstructive sleep apnea (adult) (pediatric): Secondary | ICD-10-CM | POA: Insufficient documentation

## 2020-03-10 ENCOUNTER — Ambulatory Visit
Admission: RE | Admit: 2020-03-10 | Discharge: 2020-03-10 | Disposition: A | Discharge status: Home / Self Care | Payer: PPO | Source: Ambulatory Visit | Attending: Family Medicine | Admitting: Family Medicine

## 2020-03-10 ENCOUNTER — Other Ambulatory Visit: Payer: Self-pay

## 2020-03-10 DIAGNOSIS — Z1231 Encounter for screening mammogram for malignant neoplasm of breast: Secondary | ICD-10-CM | POA: Diagnosis not present

## 2020-03-11 DIAGNOSIS — G4733 Obstructive sleep apnea (adult) (pediatric): Secondary | ICD-10-CM

## 2020-03-11 DIAGNOSIS — Z9989 Dependence on other enabling machines and devices: Secondary | ICD-10-CM | POA: Diagnosis not present

## 2020-03-11 NOTE — Procedures (Signed)
    Patient Name: Kara Wright, Dice Date: 03/08/2020 Gender: Female D.O.B: [birth year] Age (years): 76 Referring Provider: Geraldo Pitter NP Height (inches): 64 Interpreting Physician: Chesley Mires MD, ABSM Weight (lbs): 232 RPSGT: Carolin Coy BMI: 40 MRN: [id number] Neck Size: 15.75  CLINICAL INFORMATION The patient is referred for a CPAP titration to treat sleep apnea.  Date of HST 01/17/19: AHI 36.5, SpO2 low 60%.  SLEEP STUDY TECHNIQUE As per the AASM Manual for the Scoring of Sleep and Associated Events v2.3 (April 2016) with a hypopnea requiring 4% desaturations.  The channels recorded and monitored were frontal, central and occipital EEG, electrooculogram (EOG), submentalis EMG (chin), nasal and oral airflow, thoracic and abdominal wall motion, anterior tibialis EMG, snore microphone, electrocardiogram, and pulse oximetry. Bilevel positive airway pressure (BPAP) was initiated at the beginning of the study and titrated to treat sleep-disordered breathing.  MEDICATIONS Medications self-administered by patient taken the night of the study : N/A  RESPIRATORY PARAMETERS Optimal CPAP Pressure (cm): 17 AHI at Optimal Pressure (/hr) 3.2 Overall Minimal O2 (%): 77.0 Minimal O2 at Optimal Pressure (%): 91.0  SLEEP ARCHITECTURE Start Time: 10:28:45 PM Stop Time: 4:56:24 AM Total Time (min): 387.7 Total Sleep Time (min): 341.7 Sleep Latency (min): 17.9 Sleep Efficiency (%): 88.2% REM Latency (min): 98.0 WASO (min): 28.0 Stage N1 (%): 5.4% Stage N2 (%): 81.9% Stage N3 (%): 0.6% Stage R (%): 12.1 Supine (%): 80.00 Arousal Index (/hr): 98.7   CARDIAC DATA The 2 lead EKG demonstrated sinus rhythm. The mean heart rate was 58.1 beats per minute. Other EKG findings include: PVCs.  LEG MOVEMENT DATA The total Periodic Limb Movements of Sleep (PLMS) were 0. The PLMS index was 0.0. A PLMS index of <15 is considered normal in adults.  IMPRESSIONS - She did well with CPAP  at 17 cm  H2O. - She did not require use of supplemental oxygen during this study.  DIAGNOSIS - Obstructive Sleep Apnea (G47.33)  RECOMMENDATIONS - Trial of CPAP therapy on 17 cm H2O with a Medium size Resmed Nasal Pillow Mask AirFit P10 mask and heated humidification.  [Electronically signed] 03/11/2020 02:49 PM  Chesley Mires MD, [place], American Board of Sleep Medicine   NPI: [id number]

## 2020-03-12 ENCOUNTER — Telehealth: Payer: Self-pay | Admitting: Primary Care

## 2020-03-12 DIAGNOSIS — Z9989 Dependence on other enabling machines and devices: Secondary | ICD-10-CM

## 2020-03-12 NOTE — Telephone Encounter (Signed)
Currently on auto CPAP. Please change to CPAP 17cm h20, needs size medium resmed nasal pillow airfit P10, heated humidity.   She did no require oxygen. Overall minimal O2 77%, with optimal CPAP pressure setting O2 was 91%.   Please notify patient of changes

## 2020-03-12 NOTE — Telephone Encounter (Signed)
Called and spoke with pt letting her know the results of the ONO and stated to her that we were going to change cpap settings. Also stated to her that she did not require O2. Pt verbalized understanding. Orders have been placed.nothing further needed.

## 2020-03-14 DIAGNOSIS — G4733 Obstructive sleep apnea (adult) (pediatric): Secondary | ICD-10-CM | POA: Diagnosis not present

## 2020-03-16 ENCOUNTER — Telehealth: Payer: Self-pay | Admitting: Pulmonary Disease

## 2020-03-16 DIAGNOSIS — G4733 Obstructive sleep apnea (adult) (pediatric): Secondary | ICD-10-CM

## 2020-03-16 NOTE — Telephone Encounter (Signed)
Spoke with pt, states that her cpap pressure was previously 5-18 cmH2O and was changed to set 17cmH2O.  Last night was the first night with the new pressure.  Pt states that she was unable to tolerate the flow of cpap pressure.  Requesting we change cpap pressure back to the auto 5-18cmH20 as previously prescribed.  DME: [facility].  Download is available on [facility].   Sending to APP since AO is working in [facility] today.  Aaron Edelman please advise if ok to switch back to previous pressure.  Thanks!

## 2020-03-16 NOTE — Telephone Encounter (Signed)
Called and spoke with patient to let her know that Aaron Edelman said it was ok for her to change her CPAP settings back to previous. DME order sent to [facility]. Nothing further needed at this time.

## 2020-03-16 NOTE — Telephone Encounter (Signed)
I am okay with the switching back to that pressure setting.  Please also coordinate follow-up with Dr. Jenetta Downer in 4 to 8 weeks in office view.  Please route to Dr. Jenetta Downer as Juluis Rainier.  Wyn Quaker, FNP

## 2020-04-08 ENCOUNTER — Other Ambulatory Visit: Payer: Self-pay

## 2020-04-08 ENCOUNTER — Ambulatory Visit: Payer: PPO | Admitting: Pulmonary Disease

## 2020-04-08 ENCOUNTER — Encounter: Payer: Self-pay | Admitting: Pulmonary Disease

## 2020-04-08 VITALS — BP 126/80 | HR 77 | Temp 98.0°F | Wt 231.5 lb

## 2020-04-08 DIAGNOSIS — G4733 Obstructive sleep apnea (adult) (pediatric): Secondary | ICD-10-CM

## 2020-04-08 DIAGNOSIS — Z9989 Dependence on other enabling machines and devices: Secondary | ICD-10-CM | POA: Diagnosis not present

## 2020-04-08 NOTE — Progress Notes (Signed)
Subjective:    Patient ID: Kara Wright, female    DOB: 03-30-1946, 74 y.o.   MRN: [id number]  Patient seen with obstructive sleep apnea on CPAP therapy  Had been on CPAP of 17 This was switched to auto titrating CPAP 5-18  This is better tolerated Sleeping well Functioning well  She is feeling well on waking up in the morning Has no issues with CPAP at present  History: History of snoring No witnessed apneas Wakes up multiple times during the night Admits to dryness of her mouth in the mornings Admits to memory issues Denies chronic headaches About 15 pound weight gain recently  Usually goes to bed between 11 and 12 midnight Able to fall asleep in about 30 minutes Wakes up 2-3 times a night to use the bathroom Final awakening time about 9 to 9:30 AM  No family history of obstructive sleep apnea She does have a history of atrial fibrillation     Review of Systems  Constitutional: Negative for fever and unexpected weight change.  HENT: Negative for congestion, dental problem, ear pain, nosebleeds, postnasal drip, rhinorrhea, sinus pressure, sneezing, sore throat and trouble swallowing.   Eyes: Negative for redness and itching.  Respiratory: Positive for apnea. Negative for cough, chest tightness, shortness of breath and wheezing.   Cardiovascular: Negative for leg swelling.  Genitourinary: Negative for dysuria.  Musculoskeletal: Negative for joint swelling.  Skin: Negative for rash.  Allergic/Immunologic: Negative for environmental allergies, food allergies and immunocompromised state.  Psychiatric/Behavioral: Positive for sleep disturbance.   Past Medical History:  Diagnosis Date  . Atrial fib/flutter, transient    1 episode, resolved, never on coumadin  . Cataract 2019   left eye  . Clotting disorder ([place])    on xarelto  . Diverticulosis 03/1997  . Dysrhythmia    atrial fibrilation  . GERD (gastroesophageal reflux disease) 1997   good on prilosec  .  Hepatitis    hx of hep B (late 1970s)  . Hyperlipidemia 08/1996  . Hypothyroidism 1984  . Migraine    w/o aura  . Osteopenia 11/2002   via dexa  . Presence of dental prosthetic device    temporary while waiting for implant  . Skin cancer    skin cancer  . Stroke [facility]) 08/2011   TIA   Social History   Socioeconomic History  . Marital status: Married    Spouse name: Not on file  . Number of children: 2  . Years of education: Not on file  . Highest education level: Not on file  Occupational History  . Occupation: Ecologist: RETIRED    Comment: [employer]  . Occupation: Barrister's clerk: [employer]  Tobacco Use  . Smoking status: Former Smoker    Packs/day: 1.00    Quit date: 06/26/1986    Years since quitting: 33.8  . Smokeless tobacco: Never Used  Vaping Use  . Vaping Use: Never used  Substance and Sexual Activity  . Alcohol use: No  . Drug use: No  . Sexual activity: Not Currently  Other Topics Concern  . Not on file  Social History Narrative   Works part time at [employer] office   Married 1979   2 daughters from prev relationship   [employer] fan, [employer] fan.   Social Determinants of Health   Financial Resource Strain: Low Risk   . Difficulty of Paying Living Expenses: Not hard at all  Food Insecurity: No Food Insecurity  . Worried About Charity fundraiser in the Last Year: Never true  . Ran Out of Food in the Last Year: Never true  Transportation Needs: No Transportation Needs  . Lack of Transportation (Medical): No  . Lack of Transportation (Non-Medical): No  Physical Activity: Inactive  . Days of Exercise per Week: 0 days  . Minutes of Exercise per Session: 0 min  Stress: No Stress Concern Present  . Feeling of Stress : Not at all  Social Connections:   . Frequency of Communication with Friends and Family: Not on file  . Frequency of Social Gatherings with Friends and Family: Not on file  . Attends  Religious Services: Not on file  . Active Member of Clubs or Organizations: Not on file  . Attends Archivist Meetings: Not on file  . Marital Status: Not on file  Intimate Partner Violence: Not At Risk  . Fear of Current or Ex-Partner: No  . Emotionally Abused: No  . Physically Abused: No  . Sexually Abused: No   Family History  Problem Relation Age of Onset  . Hypertension Mother   . Osteopenia Mother   . Stroke Mother        deceased age 27  . Colon cancer Father 60       mets  . Allergies Sister   . Breast cancer Cousin        pat cousin  . Rectal cancer Neg Hx       Objective:   Physical Exam Constitutional:      Appearance: Normal appearance.  HENT:     Head: Normocephalic and atraumatic.     Mouth/Throat:     Mouth: Mucous membranes are moist.  Eyes:     General:        Right eye: No discharge.        Left eye: No discharge.     Extraocular Movements: Extraocular movements intact.     Pupils: Pupils are equal, round, and reactive to light.  Cardiovascular:     Rate and Rhythm: Normal rate and regular rhythm.  Pulmonary:     Effort: Pulmonary effort is normal. No respiratory distress.     Breath sounds: Normal breath sounds. No stridor. No wheezing.  Musculoskeletal:     Cervical back: No rigidity.  Neurological:     Mental Status: She is alert.  Psychiatric:        Mood and Affect: Mood normal.    Results of the Epworth flowsheet 12/30/2018  Sitting and reading 3  Watching TV 3  Sitting, inactive in a public place (e.g. a theatre or a meeting) 1  As a passenger in a car for an hour without a break 1  Lying down to rest in the afternoon when circumstances permit 1  Sitting and talking to someone 0  Sitting quietly after a lunch without alcohol 1  In a car, while stopped for a few minutes in traffic 0  Total score 10   Vitals:   04/08/20 1345  BP: 126/80  Pulse: 77  Temp: 98 F (36.7 C)  SpO2: 97%    Compliance data reviewed showing  100% compliance Average use of 5 hours 54 minutes Machine set between 5 and 18 95 percentile pressure of 11.3 Residual AHI of 3.8    Assessment & Plan:  .  Severe obstructive sleep apnea -Compliance data reviewed with the patient -Doing well  .  Morbid obesity -Encourage diet  and exercise -Graded exercise as tolerated  .  History of coronary artery disease/atrial fibrillation -Symptoms well controlled at present  .  History of hypothyroidism  Plan: .  Continue CPAP on a regular basis  .  No changes need to be made to the pressures at present  .  Weight loss encouraged  .  Follow-up in 6 months

## 2020-04-08 NOTE — Patient Instructions (Signed)
Your obstructive sleep apnea is well controlled at present  -Continue using machine on current settings  -Continue to make sure you use it nightly  -Graded exercises as tolerated  -Focus on good diet and exercise to start of a good weight loss strongly  -I will see you back in 6 months  Call with any concerns

## 2020-04-13 ENCOUNTER — Telehealth: Payer: Self-pay

## 2020-04-13 DIAGNOSIS — G4733 Obstructive sleep apnea (adult) (pediatric): Secondary | ICD-10-CM | POA: Diagnosis not present

## 2020-04-13 NOTE — Telephone Encounter (Signed)
Pt said for 3 wks pt has lingering cold and prod cough with brown phlem; pt began to feel worse this past weekend and pt has had S/T  since 04/11/20; throat is sore and not just scratchy..pt has been taking mucinex which helps a little bit but the cough just will not go away. Pt is not sure if has fever and pt has hoarseness. Pt had slight h/a 2 days ago; no H/A today. Pt took flu shot this past weekend at [employer] [facility]. Pt had diarrhea on 04/12/20. Pt saw Dr Ander Slade on 04/08/20 and symptoms worsened over the weekend. Pt is not sure if wheezing or not. Pt did not want to go to UC and pt scheduled video visit with K Clark N P on 04/14/20 at 8 AM. UC & ED precautions given and pt voiced understanding. Pt will have vitals that she can do ready when CMA calls.

## 2020-04-13 NOTE — Telephone Encounter (Signed)
Noted, will evaluate. 

## 2020-04-14 ENCOUNTER — Other Ambulatory Visit: Payer: Self-pay | Admitting: Family Medicine

## 2020-04-14 ENCOUNTER — Telehealth (INDEPENDENT_AMBULATORY_CARE_PROVIDER_SITE_OTHER): Payer: PPO | Admitting: Primary Care

## 2020-04-14 ENCOUNTER — Other Ambulatory Visit: Payer: Self-pay

## 2020-04-14 ENCOUNTER — Other Ambulatory Visit: Payer: PPO

## 2020-04-14 ENCOUNTER — Other Ambulatory Visit: Payer: Self-pay | Admitting: Primary Care

## 2020-04-14 VITALS — Temp 98.1°F | Ht 64.0 in | Wt 231.0 lb

## 2020-04-14 DIAGNOSIS — R059 Cough, unspecified: Secondary | ICD-10-CM

## 2020-04-14 MED ORDER — AZITHROMYCIN 250 MG PO TABS: ORAL_TABLET | ORAL | 0 refills | Status: DC

## 2020-04-14 MED ORDER — GUAIFENESIN-CODEINE 100-10 MG/5ML PO SYRP: 5.0000 mL | ORAL_SOLUTION | Freq: Three times a day (TID) | ORAL | 0 refills | Status: DC | PRN

## 2020-04-14 NOTE — Telephone Encounter (Signed)
Sent. Thanks.   

## 2020-04-14 NOTE — Telephone Encounter (Signed)
Refill request Promethazine Last office visit today 04/14/20 acute Last refill 01/11/20 #30/3

## 2020-04-14 NOTE — Progress Notes (Signed)
Subjective:    Patient ID: Kara Wright, female    DOB: [birth year], 74 y.o.   MRN: [id number]  HPI  Virtual Visit via Video Note  I connected with Kara Wright on 04/14/20 at  8:00 AM EDT by a video enabled telemedicine application and verified that I am speaking with the correct person using two identifiers.  Location: Patient: Home Provider: Office Participants: Patient and myself   I discussed the limitations of evaluation and management by telemedicine and the availability of in person appointments. The patient expressed understanding and agreed to proceed.  History of Present Illness:  Kara Wright is a 74 year old female patient of Dr. Damita Dunnings with a history of TIA, hypertension, GERD, CAD, pulmonary nodule who presents today with a chief complaint of cough.  She also reports sore throat, chest congestion. Her husband had a "cold" for two weeks, then she started to develop symptoms. Her symptoms began three weeks ago, started feeling better several days ago, but then now is feeling worse.   She's been taking Mucinex. He denies fevers, loss of taste/smell, diarrhea.   She has not been tested for Covid-19. She did have Covid-19 infection in 2020, has been vaccinated against Covid-19 in early 2021. She had an influenza vaccine a few days ago. Her husband is still coughing quite a bit.    Observations/Objective:  Alert and oriented. Appears tired. No distress. Speaking in complete sentences. No cough during visit.   Assessment and Plan:  Three week history of cough that is now worse. Husband with same symptoms and is about the same. She will come for Covid-19 testing. Lower suspicion for Covid-19 infection but need to rule out.  Will also treat for presumed bacterial involvement.  Rx for Zpak sent to pharmacy. She is also requesting cough medication with codeine which was sent.  Return precautions provided.  Follow Up Instructions:  Start Azithromycin  antibiotics for infection. Take 2 tablets by mouth today, then 1 tablet daily for 4 additional days.  You may take the cough suppressant every 8 hours as needed for cough and rest. Caution this medication contains codeine which may cause drowsiness.   Please come this afternoon for Covid-19 testing.  Please call if no better in 3-4 days.  It was a pleasure meeting you! Kara Bossier, NP-C    I discussed the assessment and treatment plan with the patient. The patient was provided an opportunity to ask questions and all were answered. The patient agreed with the plan and demonstrated an understanding of the instructions.   The patient was advised to call back or seek an in-person evaluation if the symptoms worsen or if the condition fails to improve as anticipated.   Kara Koch, NP    Review of Systems  Constitutional: Positive for fatigue.  HENT: Positive for congestion and sore throat.   Respiratory: Positive for cough and shortness of breath.   Cardiovascular: Negative for chest pain.  Allergic/Immunologic: Negative for environmental allergies.       Past Medical History:  Diagnosis Date  . Atrial fib/flutter, transient    1 episode, resolved, never on coumadin  . Cataract 2019   left eye  . Clotting disorder ([place])    on xarelto  . Diverticulosis 03/1997  . Dysrhythmia    atrial fibrilation  . GERD (gastroesophageal reflux disease) 1997   good on prilosec  . Hepatitis    hx of hep B (late 1970s)  . Hyperlipidemia 08/1996  . Hypothyroidism  1984  . Migraine    w/o aura  . Osteopenia 11/2002   via dexa  . Presence of dental prosthetic device    temporary while waiting for implant  . Skin cancer    skin cancer  . Stroke [facility]) 08/2011   TIA     Social History   Socioeconomic History  . Marital status: Married    Spouse name: Not on file  . Number of children: 2  . Years of education: Not on file  . Highest education level: Not on file  Occupational  History  . Occupation: Ecologist: RETIRED    Comment: [employer]  . Occupation: Barrister's clerk: [employer]  Tobacco Use  . Smoking status: Former Smoker    Packs/day: 1.00    Quit date: 06/26/1986    Years since quitting: 33.8  . Smokeless tobacco: Never Used  Vaping Use  . Vaping Use: Never used  Substance and Sexual Activity  . Alcohol use: No  . Drug use: No  . Sexual activity: Not Currently  Other Topics Concern  . Not on file  Social History Narrative   Works part time at [employer] office   Married 1979   2 daughters from prev relationship   [employer] fan, [employer] fan.   Social Determinants of Health   Financial Resource Strain: Low Risk   . Difficulty of Paying Living Expenses: Not hard at all  Food Insecurity: No Food Insecurity  . Worried About Charity fundraiser in the Last Year: Never true  . Ran Out of Food in the Last Year: Never true  Transportation Needs: No Transportation Needs  . Lack of Transportation (Medical): No  . Lack of Transportation (Non-Medical): No  Physical Activity: Inactive  . Days of Exercise per Week: 0 days  . Minutes of Exercise per Session: 0 min  Stress: No Stress Concern Present  . Feeling of Stress : Not at all  Social Connections:   . Frequency of Communication with Friends and Family: Not on file  . Frequency of Social Gatherings with Friends and Family: Not on file  . Attends Religious Services: Not on file  . Active Member of Clubs or Organizations: Not on file  . Attends Archivist Meetings: Not on file  . Marital Status: Not on file  Intimate Partner Violence: Not At Risk  . Fear of Current or Ex-Partner: No  . Emotionally Abused: No  . Physically Abused: No  . Sexually Abused: No    Past Surgical History:  Procedure Laterality Date  . ABDOMINAL HYSTERECTOMY    . ANAL FISSURE REPAIR    . CATARACT EXTRACTION W/PHACO Right 02/06/2018   Procedure: CATARACT  EXTRACTION PHACO AND INTRAOCULAR LENS PLACEMENT ([place]) RIGHT;  Surgeon: Leandrew Koyanagi, MD;  Location: [facility];  Service: Ophthalmology;  Laterality: Right;  . CHOLECYSTECTOMY  07/2006  . 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 nasolabial fold skin flap surgery  08/02/2007   [facility]  . PARTIAL HYSTERECTOMY  1978-79  . POLYPECTOMY  04/06/97   colon, benign by pathology  . varicose vein ablation sclerotherapy  09/2008   Dr. Hulda Humphrey    Family History  Problem Relation Age of Onset  . Hypertension Mother   . Osteopenia Mother   . Stroke Mother        deceased age 84  . Colon cancer Father 52  mets  . Allergies Sister   . Breast cancer Cousin        pat cousin  . Rectal cancer Neg Hx     Allergies  Allergen Reactions  . Celebrex [Celecoxib]     REACTION: nausea: GI UPSET  . Doxycycline Other (See Comments)    GI upset- not an allergy.    . Gold-Containing Drug Products   . Nickel     Skin rash, allergy  . Other     Fragrance, phenylenediamine, contrast metal agents, gold  . Sodium Metabisulfite Other (See Comments)    Positive patch test  . Zocor [Simvastatin]     Aches but tolerates crestor    Current Outpatient Medications on File Prior to Visit  Medication Sig Dispense Refill  . albuterol (VENTOLIN HFA) 108 (90 Base) MCG/ACT inhaler Inhale 2 puffs into the lungs every 6 (six) hours as needed for wheezing or shortness of breath. 8 g 3  . Cholecalciferol (VITAMIN D3) 50 MCG (2000 UT) capsule Take 2 capsules (4,000 Units total) by mouth daily. 180 capsule 3  . diltiazem (CARDIZEM) 30 MG tablet TAKE ONE TABLET BY MOUTH THREE TIMES DAILY AS NEEDED 90 tablet 1  . diphenhydrAMINE (BENADRYL) 25 MG tablet Take 25 mg by mouth every 8 (eight) hours as needed for itching.    Marland Kitchen ibuprofen (ADVIL) 600 MG tablet Take 600 mg by mouth 3 (three) times daily.    Marland Kitchen levothyroxine (SYNTHROID) 150 MCG tablet Take 1 tablet (150 mcg total) by  mouth daily. Except for 1.5 on Sunday 100 tablet 3  . loperamide (IMODIUM A-D) 2 MG tablet Take 2 mg by mouth 2 (two) times daily as needed for diarrhea or loose stools.    Marland Kitchen omeprazole (PRILOSEC) 20 MG capsule Take 1 capsule (20 mg total) by mouth daily. 90 capsule 3  . promethazine (PHENERGAN) 25 MG tablet TAKE ONE TABLET BY MOUTH EVERY 6 HOURS AS NEEDED FOR NAUSEA 30 tablet 3  . propranolol (INNOPRAN XL) 120 MG 24 hr capsule Take by mouth.    . propranolol ER (INDERAL LA) 120 MG 24 hr capsule Take 1 capsule (120 mg total) by mouth daily. 90 capsule 3  . rivaroxaban (XARELTO) 20 MG TABS tablet TAKE 1 TABLET BY MOUTH ONCE DAILY WITH  SUPPER 90 tablet 3  . rosuvastatin (CRESTOR) 5 MG tablet TAKE ONE TABLET BY MOUTH AT BEDTIME 90 tablet 2  . traMADol (ULTRAM) 50 MG tablet TAKE 1 TABLET BY MOUTH EVERY 8 HOURS AS NEEDED...CAUTION SEDATION 90 tablet 0   No current facility-administered medications on file prior to visit.    Temp 98.1 F (36.7 C) (Temporal)   Ht 5\' 4"  (1.626 m)   Wt 231 lb (104.8 kg)   BMI 39.65 kg/m    Objective:   Physical Exam Constitutional:      Appearance: She is not ill-appearing.     Comments: Appears tired/fatigued  Pulmonary:     Effort: Pulmonary effort is normal.     Comments: No cough during visit Neurological:     Mental Status: She is alert and oriented to person, place, and time.  Psychiatric:        Mood and Affect: Mood normal.            Assessment & Plan:

## 2020-04-14 NOTE — Patient Instructions (Signed)
Start Azithromycin antibiotics for infection. Take 2 tablets by mouth today, then 1 tablet daily for 4 additional days.  You may take the cough suppressant every 8 hours as needed for cough and rest. Caution this medication contains codeine which may cause drowsiness.   Please come this afternoon for Covid-19 testing.  Please call if no better in 3-4 days.  It was a pleasure meeting you! [name], NP-C

## 2020-04-14 NOTE — Assessment & Plan Note (Signed)
Three week history of cough that is now worse. Husband with same symptoms and is about the same. She will come for Covid-19 testing. Lower suspicion for Covid-19 infection but need to rule out.  Will also treat for presumed bacterial involvement.  Rx for Zpak sent to pharmacy. She is also requesting cough medication with codeine which was sent.  Return precautions provided.

## 2020-04-15 ENCOUNTER — Other Ambulatory Visit: Payer: Self-pay | Admitting: *Deleted

## 2020-04-15 MED ORDER — DILTIAZEM HCL 30 MG PO TABS: 30.0000 mg | ORAL_TABLET | Freq: Three times a day (TID) | ORAL | 1 refills | Status: DC | PRN

## 2020-04-16 ENCOUNTER — Telehealth: Payer: Self-pay

## 2020-04-16 LAB — SPECIMEN STATUS REPORT

## 2020-04-16 LAB — SARS-COV-2, NAA 2 DAY TAT

## 2020-04-16 LAB — NOVEL CORONAVIRUS, NAA: SARS-CoV-2, NAA: NOT DETECTED

## 2020-04-16 NOTE — Telephone Encounter (Signed)
Please advise results not released ok to tell patient it is negative?

## 2020-04-16 NOTE — Telephone Encounter (Signed)
[place] Night - Client Nonclinical Telephone Record AccessNurse Client [place] Night - Client Client Site [facility] [place] Physician Renford Dills - MD Contact Type Call Who Is Calling Patient / Member / Family / Caregiver Caller Name Kara Wright Caller Phone Number [phone] Patient Name [birth year] Patient DOB [phone] Call Type Message Only Information Provided Reason for Call Request for Lab/Test Results Initial Comment Caller is trying to find out what the outcome on a Covid19 test is. Additional Comment Caller is trying to get the results of her Covid19 test. Disp. Time Disposition Final User 04/15/2020 5:33:33 PM General Information Provided Yes King-Hussey, [name] Call Closed By: Bonnita Nasuti Transaction Date/Time: 04/15/2020 5:29:50 PM (ET)

## 2020-04-16 NOTE — Telephone Encounter (Signed)
Please update patient, covid test neg, I just saw the results.  Update [place] as needed.  Thanks.

## 2020-04-16 NOTE — Telephone Encounter (Signed)
Patient advised.

## 2020-04-21 ENCOUNTER — Telehealth: Payer: Self-pay

## 2020-04-21 NOTE — Telephone Encounter (Signed)
Did Jesus Genera contact the patient?  How does a bone density test at this point going to change the plan?  We already know she has osteoporosis.  I have talked with the patient about this previously.  Does the patient want to go through treatment?  Is the patient going through treatment through an outside clinic?  My understanding was that she had not yet started treatment for osteoporosis.

## 2020-04-21 NOTE — Telephone Encounter (Signed)
-----   Message from Jesus Genera sent at 04/21/2020 10:10 AM EDT ----- Regarding: Time Sensitive OMW  Fracture Date:: 12/22/2019  Measure Expiration Date: 06/19/2020  Follow-up plan needed: Yes  Type of Follow-up: Provider follow up- Provider to schedule  Reason for Follow-up: PATIENT NEEDS DEXA SCAN. LAST BONE DENSITY ON FILE 10/16/2016.

## 2020-04-21 NOTE — Telephone Encounter (Signed)
Kara Wright can you follow-up on this?

## 2020-05-03 DIAGNOSIS — G4733 Obstructive sleep apnea (adult) (pediatric): Secondary | ICD-10-CM | POA: Diagnosis not present

## 2020-05-05 ENCOUNTER — Telehealth: Payer: Self-pay | Admitting: Family Medicine

## 2020-05-05 NOTE — Telephone Encounter (Signed)
Kara Wright called  Kara Wright wanted to bone denisty and wants it done [facility]  , she had a fracture on 12/22/19 and hta wants her to get a bone denisty but [facility] will do it scan until theres an order for it.

## 2020-05-12 ENCOUNTER — Telehealth: Payer: Self-pay | Admitting: Family Medicine

## 2020-05-12 DIAGNOSIS — M810 Age-related osteoporosis without current pathological fracture: Secondary | ICD-10-CM

## 2020-05-12 NOTE — Telephone Encounter (Signed)
Needs order for Bone Density . Looks like the original order was cancelled. Patient is calling to have [place] resend the order. EM Best Phone # IS [phone]

## 2020-05-12 NOTE — Telephone Encounter (Signed)
Ordered. Thanks

## 2020-05-12 NOTE — Addendum Note (Signed)
Addended by: Tonia Ghent on: 05/12/2020 12:38 PM   Modules accepted: Orders

## 2020-05-14 DIAGNOSIS — G4733 Obstructive sleep apnea (adult) (pediatric): Secondary | ICD-10-CM | POA: Diagnosis not present

## 2020-05-17 NOTE — Telephone Encounter (Signed)
Request for new order. Quality outreach from [facility] reach out to the pt to coordinate her dexa scan apt per our Outreach agreement and the order that was in the chart was expired. Tiffany works for the Personnel officer at [facility] and has contacted the pt and the pt does want to proceed with the dexa scan. It appears the order in the chart was expired and that Dr. Damita Dunnings has now placed the new order for the dexa scan.

## 2020-05-18 NOTE — Telephone Encounter (Signed)
Thanks

## 2020-05-19 DIAGNOSIS — L239 Allergic contact dermatitis, unspecified cause: Secondary | ICD-10-CM | POA: Diagnosis not present

## 2020-05-21 DIAGNOSIS — L309 Dermatitis, unspecified: Secondary | ICD-10-CM | POA: Diagnosis not present

## 2020-05-25 NOTE — Telephone Encounter (Signed)
Patient is scheduled for BD 07/01/2020  Nothing further needed.

## 2020-07-01 ENCOUNTER — Inpatient Hospital Stay: Admission: RE | Admit: 2020-07-01 | Payer: PPO | Source: Ambulatory Visit

## 2020-07-09 DIAGNOSIS — Z79899 Other long term (current) drug therapy: Secondary | ICD-10-CM | POA: Diagnosis not present

## 2020-07-12 ENCOUNTER — Ambulatory Visit (INDEPENDENT_AMBULATORY_CARE_PROVIDER_SITE_OTHER)
Admission: RE | Admit: 2020-07-12 | Discharge: 2020-07-12 | Disposition: A | Discharge status: Home / Self Care | Payer: PPO | Source: Ambulatory Visit | Attending: Primary Care | Admitting: Primary Care

## 2020-07-12 ENCOUNTER — Other Ambulatory Visit: Payer: Self-pay

## 2020-07-12 DIAGNOSIS — R918 Other nonspecific abnormal finding of lung field: Secondary | ICD-10-CM

## 2020-07-12 DIAGNOSIS — I700 Atherosclerosis of aorta: Secondary | ICD-10-CM | POA: Diagnosis not present

## 2020-07-12 DIAGNOSIS — M47814 Spondylosis without myelopathy or radiculopathy, thoracic region: Secondary | ICD-10-CM | POA: Diagnosis not present

## 2020-07-12 DIAGNOSIS — I7789 Other specified disorders of arteries and arterioles: Secondary | ICD-10-CM | POA: Diagnosis not present

## 2020-07-12 DIAGNOSIS — I2510 Atherosclerotic heart disease of native coronary artery without angina pectoris: Secondary | ICD-10-CM | POA: Diagnosis not present

## 2020-07-13 ENCOUNTER — Other Ambulatory Visit: Payer: Self-pay | Admitting: Family Medicine

## 2020-07-14 NOTE — Telephone Encounter (Signed)
Pharmacy requests refill on: Rosuvastatin 5 mg   LAST REFILL: 11/10/2019 LAST OV: 12/15/2019 NEXT OV: Not Scheduled  PHARMACY: [place]

## 2020-07-15 NOTE — Progress Notes (Signed)
Please let patient know her CT scan showed fewer nodules bilaterally. There is one residual nodules left upper lobe measuring 1cm. Interval 46mm and 1cm nodules LLL. Mild mediastinal adenopathy. Recommend repeating CT chest wo contrast in 6 months (please order). Overdue for follow-up with Dr. Valeta Harms.

## 2020-07-16 NOTE — Progress Notes (Signed)
Called and left message on voicemail to please return phone call to go over results. Contact number provided. 

## 2020-07-19 ENCOUNTER — Telehealth: Payer: Self-pay | Admitting: Pulmonary Disease

## 2020-07-19 DIAGNOSIS — R918 Other nonspecific abnormal finding of lung field: Secondary | ICD-10-CM

## 2020-07-19 NOTE — Telephone Encounter (Signed)
Martyn Ehrich, NP  07/15/2020 9:29 AM EST      Please let patient know her CT scan showed fewer nodules bilaterally. There is one residual nodules left upper lobe measuring 1cm. Interval 63mm and 1cm nodules LLL. Mild mediastinal adenopathy. Recommend repeating CT chest wo contrast in 6 months (please order). Overdue for follow-up with Dr. Lynnae January and spoke with pt letting her know the results of CT stated by [facility] and she verbalized understanding. Stated to pt that recommendations was to repeat CT in 6 months. Also stated to her that Eustaquio Maize said she was overdue for f/u with BI so I have scheduled pt an appt with BI. Nothing further needed.

## 2020-07-20 ENCOUNTER — Ambulatory Visit
Admission: RE | Admit: 2020-07-20 | Discharge: 2020-07-20 | Disposition: A | Discharge status: Home / Self Care | Payer: PPO | Source: Ambulatory Visit | Attending: Family Medicine | Admitting: Family Medicine

## 2020-07-20 ENCOUNTER — Other Ambulatory Visit: Payer: Self-pay

## 2020-07-20 DIAGNOSIS — M810 Age-related osteoporosis without current pathological fracture: Secondary | ICD-10-CM | POA: Insufficient documentation

## 2020-07-20 DIAGNOSIS — R29890 Loss of height: Secondary | ICD-10-CM | POA: Diagnosis not present

## 2020-07-20 DIAGNOSIS — Z85828 Personal history of other malignant neoplasm of skin: Secondary | ICD-10-CM | POA: Diagnosis not present

## 2020-07-20 DIAGNOSIS — Z8262 Family history of osteoporosis: Secondary | ICD-10-CM | POA: Diagnosis not present

## 2020-07-20 DIAGNOSIS — E039 Hypothyroidism, unspecified: Secondary | ICD-10-CM | POA: Diagnosis not present

## 2020-07-22 ENCOUNTER — Encounter: Payer: Self-pay | Admitting: Pulmonary Disease

## 2020-07-22 ENCOUNTER — Ambulatory Visit: Payer: PPO | Admitting: Pulmonary Disease

## 2020-07-22 ENCOUNTER — Other Ambulatory Visit: Payer: Self-pay

## 2020-07-22 VITALS — BP 126/78 | HR 72 | Temp 98.6°F | Ht 63.0 in | Wt 229.0 lb

## 2020-07-22 DIAGNOSIS — R918 Other nonspecific abnormal finding of lung field: Secondary | ICD-10-CM

## 2020-07-22 DIAGNOSIS — G4733 Obstructive sleep apnea (adult) (pediatric): Secondary | ICD-10-CM | POA: Diagnosis not present

## 2020-07-22 DIAGNOSIS — Z6841 Body Mass Index (BMI) 40.0 and over, adult: Secondary | ICD-10-CM | POA: Diagnosis not present

## 2020-07-22 DIAGNOSIS — Z87891 Personal history of nicotine dependence: Secondary | ICD-10-CM

## 2020-07-22 DIAGNOSIS — Z9989 Dependence on other enabling machines and devices: Secondary | ICD-10-CM | POA: Diagnosis not present

## 2020-07-22 NOTE — Patient Instructions (Signed)
Thank you for visiting Dr. Valeta Harms at [facility]. Today we recommend the following:  Orders Placed This Encounter  Procedures  . CT Chest Wo Contrast   Please schedule in 6 months from now.   Return in about 6 months (around 01/19/2021).    Please do your part to reduce the spread of COVID-19. '

## 2020-07-22 NOTE — Progress Notes (Signed)
Synopsis: Referred in February 2020 for nodules by Tonia Ghent, MD  Subjective:   PATIENT ID: Kara Wright GENDER: female DOB: 10-Aug-1945, MRN: [id number]  Chief Complaint  Patient presents with  . Follow-up    SOB unchanged. Questioning reason for continued CT scans. Using albuterol as needed.    Past medical history of atrial fib flutter, clotting disorder on Xarelto, hyperlipidemia, hypothyroidism, history of TIA stroke. Patient recently had follow up CT imaging that revealed stable nodule in the LUL and new nodule in the RLL.  In addition the patient has a past 8 to 64-month history of dyspnea on exertion.  She states that she gets "short winded" when she is climbs a hill or climb stairs.  When asked about her sleep her husband pipes up that she does snore at times.  She is not sure if she thinks her sleep is restful or not.  Patient does not wheeze.  She occasionally has random left-sided sharp pains in the chest.  This is been going on for some time.  She follows with cardiology regarding her atrial fibrillation.  Review of her CT imaging today in the office also reveals enlarged pulmonary arteries.  This was discussed with the patient.  OV 12/18/2018: Patient seen here today for follow-up regarding multiple pulmonary nodules.  Patient had CT imaging of the chest completed on 12/13/2018 for follow-up regarding her nodules.  There is a new small 5 mm nodule in the right pulmonary apex a new 4 mm nodule in the anterior left upper lobe prior nodules either resolved or stable. Today, she states that she has breathing better.  She does have some dyspnea on exertion.  She was unable to get her sleep appointment scheduled.  We will try again for this.  She was very anxious about having the CT scan for the follow-up of the nodules.  She is relieved to hear that they are smaller.  She does understand there is a few new ones that need follow-up.  Otherwise doing well.  She had PFTs completed prior  to today's office which which we reviewed.  It does reveal a reduced FEV1 and FVC with a reserved ratio and a reduced ERV of 10%.  DLCO of 74%.  This is likely related to her weight.  BMI of 40.  We discussed this today in the office as well.  She is trying to watch her weight and her dietary intake.  She does understand importance of losing weight.  OV 07/22/2020: Patient here today for follow-up regarding recent CT scan of the chest for follow-up of lung nodules.  Patient had a CT scan of the chest on 07/12/2020 which revealed persistent waxing and waning bilateral lung nodules.  The left lower lobe lung nodule 1 cm in size seems to be the largest left the other upper lobe nodules that was seen previously are no longer present have subsided she also has some small adenopathy within the chest.  She does have enlarged pulmonary arteries consistent with possible underlying PAH.  From respiratory standpoint she is stable.  She does not have cough sputum production night sweats weight loss fevers.      Past Medical History:  Diagnosis Date  . Atrial fib/flutter, transient    1 episode, resolved, never on coumadin  . Cataract 2019   left eye  . Clotting disorder ([place])    on xarelto  . Diverticulosis 03/1997  . Dysrhythmia    atrial fibrilation  . GERD (gastroesophageal reflux  disease) 1997   good on prilosec  . Hepatitis    hx of hep B (late 1970s)  . Hyperlipidemia 08/1996  . Hypothyroidism 1984  . Migraine    w/o aura  . Osteopenia 11/2002   via dexa  . Presence of dental prosthetic device    temporary while waiting for implant  . Skin cancer    skin cancer  . Stroke [facility]) 08/2011   TIA     Family History  Problem Relation Age of Onset  . Hypertension Mother   . Osteopenia Mother   . Stroke Mother        deceased age 71  . Colon cancer Father 60       mets  . Allergies Sister   . Breast cancer Cousin        pat cousin  . Rectal cancer Neg Hx      Past Surgical History:   Procedure Laterality Date  . ABDOMINAL HYSTERECTOMY    . ANAL FISSURE REPAIR    . CATARACT EXTRACTION W/PHACO Right 02/06/2018   Procedure: CATARACT EXTRACTION PHACO AND INTRAOCULAR LENS PLACEMENT ([place]) RIGHT;  Surgeon: Leandrew Koyanagi, MD;  Location: [place];  Service: Ophthalmology;  Laterality: Right;  . CHOLECYSTECTOMY  07/2006  . 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 nasolabial fold skin flap surgery  08/02/2007   [facility]  . PARTIAL HYSTERECTOMY  1978-79  . POLYPECTOMY  04/06/97   colon, benign by pathology  . varicose vein ablation sclerotherapy  09/2008   Dr. Hulda Humphrey    Social History   Socioeconomic History  . Marital status: Married    Spouse name: Not on file  . Number of children: 2  . Years of education: Not on file  . Highest education level: Not on file  Occupational History  . Occupation: Ecologist: RETIRED    Comment: [employer]  . Occupation: Barrister's clerk: [employer]  Tobacco Use  . Smoking status: Former Smoker    Packs/day: 1.00    Quit date: 06/26/1986    Years since quitting: 34.0  . Smokeless tobacco: Never Used  Vaping Use  . Vaping Use: Never used  Substance and Sexual Activity  . Alcohol use: No  . Drug use: No  . Sexual activity: Not Currently  Other Topics Concern  . Not on file  Social History Narrative   Works part time at [employer] office   Married 1979   2 daughters from prev relationship   [employer] fan, [employer] fan.   Social Determinants of Health   Financial Resource Strain: Low Risk   . Difficulty of Paying Living Expenses: Not hard at all  Food Insecurity: No Food Insecurity  . Worried About Charity fundraiser in the Last Year: Never true  . Ran Out of Food in the Last Year: Never true  Transportation Needs: No Transportation Needs  . Lack of Transportation (Medical): No  . Lack of Transportation (Non-Medical): No  Physical  Activity: Inactive  . Days of Exercise per Week: 0 days  . Minutes of Exercise per Session: 0 min  Stress: No Stress Concern Present  . Feeling of Stress : Not at all  Social Connections: Not on file  Intimate Partner Violence: Not At Risk  . Fear of Current or Ex-Partner: No  . Emotionally Abused: No  . Physically Abused: No  . Sexually Abused:  No     Allergies  Allergen Reactions  . Celebrex [Celecoxib]     REACTION: nausea: GI UPSET  . Doxycycline Other (See Comments)    GI upset- not an allergy.    . Gold-Containing Drug Products   . Nickel     Skin rash, allergy  . Other     Fragrance, phenylenediamine, contrast metal agents, gold  . Sodium Metabisulfite Other (See Comments)    Positive patch test  . Zocor [Simvastatin]     Aches but tolerates crestor     Outpatient Medications Prior to Visit  Medication Sig Dispense Refill  . albuterol (VENTOLIN HFA) 108 (90 Base) MCG/ACT inhaler Inhale 2 puffs into the lungs every 6 (six) hours as needed for wheezing or shortness of breath. 8 g 3  . Cholecalciferol (VITAMIN D3) 50 MCG (2000 UT) capsule Take 2 capsules (4,000 Units total) by mouth daily. 180 capsule 3  . diltiazem (CARDIZEM) 30 MG tablet Take 1 tablet (30 mg total) by mouth 3 (three) times daily as needed. 90 tablet 1  . diphenhydrAMINE (BENADRYL) 25 MG tablet Take 25 mg by mouth every 8 (eight) hours as needed for itching.    . folic acid (FOLVITE) 1 MG tablet Take 1 mg by mouth daily.    Marland Kitchen guaiFENesin-codeine (ROBITUSSIN AC) 100-10 MG/5ML syrup Take 5 mLs by mouth 3 (three) times daily as needed for cough. 75 mL 0  . ibuprofen (ADVIL) 600 MG tablet Take 600 mg by mouth 3 (three) times daily.    Marland Kitchen levothyroxine (SYNTHROID) 150 MCG tablet Take 1 tablet (150 mcg total) by mouth daily. Except for 1.5 on Sunday 100 tablet 3  . loperamide (IMODIUM A-D) 2 MG tablet Take 2 mg by mouth 2 (two) times daily as needed for diarrhea or loose stools.    . methotrexate 2.5 MG tablet  TAKE 5 TABLETS BY MOUTH ONCE A WEEK - TAKE FOLIC ACID ALL OTHER DAYS OF THE WEEK    . omeprazole (PRILOSEC) 20 MG capsule Take 1 capsule (20 mg total) by mouth daily. 90 capsule 3  . promethazine (PHENERGAN) 25 MG tablet TAKE ONE TABLET BY MOUTH EVERY 6 HOURS AS NEEDED FOR nausea 30 tablet 3  . propranolol (INNOPRAN XL) 120 MG 24 hr capsule Take by mouth.    . propranolol ER (INDERAL LA) 120 MG 24 hr capsule Take 1 capsule (120 mg total) by mouth daily. 90 capsule 3  . rivaroxaban (XARELTO) 20 MG TABS tablet TAKE 1 TABLET BY MOUTH ONCE DAILY WITH  SUPPER 90 tablet 3  . rosuvastatin (CRESTOR) 5 MG tablet TAKE ONE TABLET BY MOUTH AT BEDTIME 30 tablet 2  . traMADol (ULTRAM) 50 MG tablet TAKE 1 TABLET BY MOUTH EVERY 8 HOURS AS NEEDED...CAUTION SEDATION 90 tablet 0  . azithromycin (ZITHROMAX) 250 MG tablet Take 2 tablets by mouth today, then 1 tablet daily for 4 additional days. (Patient not taking: Reported on 07/22/2020) 6 tablet 0   No facility-administered medications prior to visit.    Review of Systems  Constitutional: Negative for chills, fever, malaise/fatigue and weight loss.  HENT: Negative for hearing loss, sore throat and tinnitus.   Eyes: Negative for blurred vision and double vision.  Respiratory: Positive for shortness of breath. Negative for cough, hemoptysis, sputum production, wheezing and stridor.   Cardiovascular: Negative for chest pain, palpitations, orthopnea, leg swelling and PND.  Gastrointestinal: Negative for abdominal pain, constipation, diarrhea, heartburn, nausea and vomiting.  Genitourinary: Negative for dysuria, hematuria and urgency.  Musculoskeletal: Negative for joint pain and myalgias.  Skin: Negative for itching and rash.  Neurological: Negative for dizziness, tingling, weakness and headaches.  Endo/Heme/Allergies: Negative for environmental allergies. Does not bruise/bleed easily.  Psychiatric/Behavioral: Negative for depression. The patient is not  nervous/anxious and does not have insomnia.   All other systems reviewed and are negative.    Objective:  Physical Exam Vitals reviewed.  Constitutional:      General: She is not in acute distress.    Appearance: She is well-developed and well-nourished. She is obese.  HENT:     Head: Normocephalic and atraumatic.     Mouth/Throat:     Mouth: Oropharynx is clear and moist.  Eyes:     General: No scleral icterus.    Conjunctiva/sclera: Conjunctivae normal.     Pupils: Pupils are equal, round, and reactive to light.  Neck:     Vascular: No JVD.     Trachea: No tracheal deviation.  Cardiovascular:     Rate and Rhythm: Normal rate and regular rhythm.     Pulses: Intact distal pulses.     Heart sounds: Normal heart sounds. No murmur heard.   Pulmonary:     Effort: Pulmonary effort is normal. No tachypnea, accessory muscle usage or respiratory distress.     Breath sounds: Normal breath sounds. No stridor. No wheezing, rhonchi or rales.  Abdominal:     General: Bowel sounds are normal. There is no distension.     Palpations: Abdomen is soft.     Tenderness: There is no abdominal tenderness.  Musculoskeletal:        General: No tenderness.     Cervical back: Neck supple.     Right lower leg: Edema present.     Left lower leg: Edema present.  Lymphadenopathy:     Cervical: No cervical adenopathy.  Skin:    General: Skin is warm and dry.     Capillary Refill: Capillary refill takes less than 2 seconds.     Findings: No rash.  Neurological:     Mental Status: She is alert and oriented to person, place, and time.  Psychiatric:        Mood and Affect: Mood and affect normal.        Behavior: Behavior normal.      Vitals:   07/22/20 1532  BP: 126/78  Pulse: 72  Temp: 98.6 F (37 C)  SpO2: 94%  Weight: 229 lb (103.9 kg)  Height: 5\' 3"  (1.6 m)   94% on RA BMI Readings from Last 3 Encounters:  07/22/20 40.57 kg/m  04/14/20 39.65 kg/m  04/08/20 39.74 kg/m   Wt  Readings from Last 3 Encounters:  07/22/20 229 lb (103.9 kg)  04/14/20 231 lb (104.8 kg)  04/08/20 231 lb 8 oz (105 kg)     CBC    Component Value Date/Time   WBC 4.8 08/13/2019 1011   RBC 4.78 08/13/2019 1011   HGB 12.5 08/13/2019 1011   HGB 13.8 08/26/2011 1334   HCT 38.7 08/13/2019 1011   HCT 41.2 08/26/2011 1334   PLT 140.0 (L) 08/13/2019 1011   PLT 156 08/26/2011 1334   MCV 81.0 08/13/2019 1011   MCV 83 08/26/2011 1334   MCH 27.1 10/28/2016 0246   MCHC 32.2 08/13/2019 1011   RDW 15.1 08/13/2019 1011   RDW 13.8 08/26/2011 1334   LYMPHSABS 1.8 08/13/2019 1011   MONOABS 0.4 08/13/2019 1011   EOSABS 0.1 08/13/2019 1011   BASOSABS 0.0 08/13/2019 1011  Chest Imaging: 07/29/2018 CTA chest 10 mm right lower lobe pulmonary nodule, 5 mm right upper lobe pulmonary nodule, left upper lobe pulmonary nodule The patient's images have been independently reviewed by me.    June 2020 CT chest: Resolved pulmonary nodule as compared to previous.  New upper lobe pulmonary nodule.  Recommending 52-month follow-up. The patient's images have been independently reviewed by me.    CT scan of the chest January 2022: Bilateral waxing and waning pulmonary nodules largest left lower lobe approximately 1 cm in size. The patient's images have been independently reviewed by me.    Pulmonary Functions Testing Results: PFT Results Latest Ref Rng & Units 12/18/2018  FVC-Pre L 1.78  FVC-Predicted Pre % 59  FVC-Post L 1.81  FVC-Predicted Post % 60  Pre FEV1/FVC % % 77  Post FEV1/FCV % % 75  FEV1-Pre L 1.37  FEV1-Predicted Pre % 61  FEV1-Post L 1.36  DLCO uncorrected ml/min/mmHg 14.66  DLCO [facility]% % 74  DLVA Predicted % 98  TLC L 4.26  TLC % Predicted % 83  RV % Predicted % 106    FeNO: None   Pathology: None   Echocardiogram:  10/27/2016: Normal ejection fraction 60 to 65%, mild LVH. 08/16/2018: 1. The left ventricle has normal systolic function, with an ejection fraction of 55-60%.  The cavity size was normal. Left ventricular diastolic parameters were normal.  2. The right ventricle has normal systolic function. The cavity was normal. There is no increase in right ventricular wall thickness.  3. Left atrial size was moderately dilated.  4. Right atrial size was mildly dilated.  5. The mitral valve is degenerative. Moderate thickening of the mitral valve leaflet. Mild calcification of the mitral valve leaflet.  6. The tricuspid valve is normal in structure.  7. The aortic valve is tricuspid Moderate thickening of the aortic valve Moderate sclerosis of the aortic valve.  Heart Catheterization: None     Assessment & Plan:     ICD-10-CM   1. Multiple pulmonary nodules  R91.8 CT Chest Wo Contrast  2. OSA on CPAP  G47.33    Z99.89   3. Former smoker  Z87.891   4. Morbid obesity ([place])  E66.01   5. BMI 40.0-44.9, adult [facility])  Z68.41     Discussion:  75 year old female, past medical history of pulmonary nodules.  Incidentally found initially.  With subsequent follow-up CT imaging.'s smoking history quit in the late 1980s.  Pulmonary function test with prism pattern, preserved ratio likely related to obesity and BMI of 40 ERV of 10%.  Plan: Recommended weight loss and increased activity tolerance. As for the pulmonary nodule she is 40-month follow-up for the lower lobe 1 cm lesion. This has been ordered she would like to have it done in [place] and or [place] area if possible. Continue follow-up with sleep clinic here for management of OSA on CPAP.  Time spent today in the office reviewing patient CT imaging   Current Outpatient Medications:  .  albuterol (VENTOLIN HFA) 108 (90 Base) MCG/ACT inhaler, Inhale 2 puffs into the lungs every 6 (six) hours as needed for wheezing or shortness of breath., Disp: 8 g, Rfl: 3 .  Cholecalciferol (VITAMIN D3) 50 MCG (2000 UT) capsule, Take 2 capsules (4,000 Units total) by mouth daily., Disp: 180 capsule, Rfl: 3 .  diltiazem  (CARDIZEM) 30 MG tablet, Take 1 tablet (30 mg total) by mouth 3 (three) times daily as needed., Disp: 90 tablet, Rfl: 1 .  diphenhydrAMINE (BENADRYL)  25 MG tablet, Take 25 mg by mouth every 8 (eight) hours as needed for itching., Disp: , Rfl:  .  folic acid (FOLVITE) 1 MG tablet, Take 1 mg by mouth daily., Disp: , Rfl:  .  guaiFENesin-codeine (ROBITUSSIN AC) 100-10 MG/5ML syrup, Take 5 mLs by mouth 3 (three) times daily as needed for cough., Disp: 75 mL, Rfl: 0 .  ibuprofen (ADVIL) 600 MG tablet, Take 600 mg by mouth 3 (three) times daily., Disp: , Rfl:  .  levothyroxine (SYNTHROID) 150 MCG tablet, Take 1 tablet (150 mcg total) by mouth daily. Except for 1.5 on Sunday, Disp: 100 tablet, Rfl: 3 .  loperamide (IMODIUM A-D) 2 MG tablet, Take 2 mg by mouth 2 (two) times daily as needed for diarrhea or loose stools., Disp: , Rfl:  .  methotrexate 2.5 MG tablet, TAKE 5 TABLETS BY MOUTH ONCE A WEEK - TAKE FOLIC ACID ALL OTHER DAYS OF THE WEEK, Disp: , Rfl:  .  omeprazole (PRILOSEC) 20 MG capsule, Take 1 capsule (20 mg total) by mouth daily., Disp: 90 capsule, Rfl: 3 .  promethazine (PHENERGAN) 25 MG tablet, TAKE ONE TABLET BY MOUTH EVERY 6 HOURS AS NEEDED FOR nausea, Disp: 30 tablet, Rfl: 3 .  propranolol (INNOPRAN XL) 120 MG 24 hr capsule, Take by mouth., Disp: , Rfl:  .  propranolol ER (INDERAL LA) 120 MG 24 hr capsule, Take 1 capsule (120 mg total) by mouth daily., Disp: 90 capsule, Rfl: 3 .  rivaroxaban (XARELTO) 20 MG TABS tablet, TAKE 1 TABLET BY MOUTH ONCE DAILY WITH  SUPPER, Disp: 90 tablet, Rfl: 3 .  rosuvastatin (CRESTOR) 5 MG tablet, TAKE ONE TABLET BY MOUTH AT BEDTIME, Disp: 30 tablet, Rfl: 2 .  traMADol (ULTRAM) 50 MG tablet, TAKE 1 TABLET BY MOUTH EVERY 8 HOURS AS NEEDED...CAUTION SEDATION, Disp: 90 tablet, Rfl: 0   Garner Nash, DO [facility] 07/22/2020 3:43 PM

## 2020-08-05 ENCOUNTER — Other Ambulatory Visit: Payer: Self-pay | Admitting: Family Medicine

## 2020-08-09 ENCOUNTER — Other Ambulatory Visit: Payer: Self-pay

## 2020-08-09 ENCOUNTER — Telehealth: Payer: Self-pay | Admitting: *Deleted

## 2020-08-09 ENCOUNTER — Ambulatory Visit
Admission: EM | Admit: 2020-08-09 | Discharge: 2020-08-09 | Disposition: A | Discharge status: Home / Self Care | Payer: PPO | Attending: Family Medicine | Admitting: Family Medicine

## 2020-08-09 DIAGNOSIS — K529 Noninfective gastroenteritis and colitis, unspecified: Secondary | ICD-10-CM | POA: Insufficient documentation

## 2020-08-09 DIAGNOSIS — E860 Dehydration: Secondary | ICD-10-CM | POA: Insufficient documentation

## 2020-08-09 LAB — CBC WITH DIFFERENTIAL/PLATELET
Abs Immature Granulocytes: 0.04 10*3/uL (ref 0.00–0.07)
Basophils Absolute: 0 10*3/uL (ref 0.0–0.1)
Basophils Relative: 0 %
Eosinophils Absolute: 0.1 10*3/uL (ref 0.0–0.5)
Eosinophils Relative: 2 %
HCT: 43.4 % (ref 36.0–46.0)
Hemoglobin: 13.9 g/dL (ref 12.0–15.0)
Immature Granulocytes: 1 %
Lymphocytes Relative: 33 %
Lymphs Abs: 2.5 10*3/uL (ref 0.7–4.0)
MCH: 25.4 pg — ABNORMAL LOW (ref 26.0–34.0)
MCHC: 32 g/dL (ref 30.0–36.0)
MCV: 79.3 fL — ABNORMAL LOW (ref 80.0–100.0)
Monocytes Absolute: 0.6 10*3/uL (ref 0.1–1.0)
Monocytes Relative: 8 %
Neutro Abs: 4.3 10*3/uL (ref 1.7–7.7)
Neutrophils Relative %: 56 %
Platelets: 210 10*3/uL (ref 150–400)
RBC: 5.47 MIL/uL — ABNORMAL HIGH (ref 3.87–5.11)
RDW: 16.1 % — ABNORMAL HIGH (ref 11.5–15.5)
WBC: 7.7 10*3/uL (ref 4.0–10.5)
nRBC: 0 % (ref 0.0–0.2)

## 2020-08-09 LAB — COMPREHENSIVE METABOLIC PANEL
ALT: 18 U/L (ref 0–44)
AST: 23 U/L (ref 15–41)
Albumin: 4.1 g/dL (ref 3.5–5.0)
Alkaline Phosphatase: 82 U/L (ref 38–126)
Anion gap: 10 (ref 5–15)
BUN: 22 mg/dL (ref 8–23)
CO2: 28 mmol/L (ref 22–32)
Calcium: 9.2 mg/dL (ref 8.9–10.3)
Chloride: 97 mmol/L — ABNORMAL LOW (ref 98–111)
Creatinine, Ser: 1.1 mg/dL — ABNORMAL HIGH (ref 0.44–1.00)
GFR, Estimated: 53 mL/min — ABNORMAL LOW (ref >60)
Glucose, Bld: 106 mg/dL — ABNORMAL HIGH (ref 70–99)
Potassium: 3.7 mmol/L (ref 3.5–5.1)
Sodium: 135 mmol/L (ref 135–145)
Total Bilirubin: 1 mg/dL (ref 0.3–1.2)
Total Protein: 7.5 g/dL (ref 6.5–8.1)

## 2020-08-09 LAB — LIPASE, BLOOD: Lipase: 32 U/L (ref 11–51)

## 2020-08-09 MED ORDER — SODIUM CHLORIDE 0.9 % IV BOLUS
1000.0000 mL | Freq: Once | INTRAVENOUS | Status: AC
  Administered 2020-08-09: 1000 mL via INTRAVENOUS

## 2020-08-09 MED ORDER — ONDANSETRON 4 MG PO TBDP: 4.0000 mg | ORAL_TABLET | Freq: Three times a day (TID) | ORAL | 0 refills | Status: DC | PRN

## 2020-08-09 NOTE — ED Provider Notes (Signed)
[facility]    CSN: [id number] Arrival date & time: 08/09/20  1541      History   Chief Complaint Chief Complaint  Patient presents with  . Diarrhea   HPI  75 year old female presents with nausea, vomiting, diarrhea.  Started on Thursday.  She reports nausea, vomiting, diarrhea.  Decreased appetite.  Last episode of vomiting was on Saturday.  She continues to have diarrhea.  She feels fatigued and weak.  She is tolerating some Jell-O and some ginger ale and Pepcid.  No documented fever.  Her temperature was mildly elevated.  She does note some chills.  She states that she slightly improved but still does not feel back to normal.  No reported sick contacts.  No other complaints.  Past Medical History:  Diagnosis Date  . Atrial fib/flutter, transient    1 episode, resolved, never on coumadin  . Cataract 2019   left eye  . Clotting disorder ([place])    on xarelto  . Diverticulosis 03/1997  . Dysrhythmia    atrial fibrilation  . GERD (gastroesophageal reflux disease) 1997   good on prilosec  . Hepatitis    hx of hep B (late 1970s)  . Hyperlipidemia 08/1996  . Hypothyroidism 1984  . Migraine    w/o aura  . Osteopenia 11/2002   via dexa  . Presence of dental prosthetic device    temporary while waiting for implant  . Skin cancer    skin cancer  . Stroke [facility]) 08/2011   TIA    Patient Active Problem List   Diagnosis Date Noted  . Sleep apnea 01/19/2020  . Pulmonary nodule 01/19/2020  . SUI (stress urinary incontinence, female) 08/24/2019  . Itching 08/24/2019  . SOB (shortness of breath) 08/24/2019  . Left acute otitis media 11/20/2018  . Exposure to 2019 novel coronavirus 09/18/2018  . Cough 10/28/2017  . Health care maintenance 08/13/2017  . Vitamin D deficiency 04/10/2017  . Hip pain 04/10/2017  . 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 Low back pain 08/04/2016  . Dysuria 09/02/2015  . Morbid obesity  ([place]) 04/05/2015  . Advance care planning 08/07/2014  . Anxiety 07/16/2012  . Esophagitis 04/17/2012  . 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 TIA (transient ischemic attack) 08/27/2011  . Medicare annual wellness visit, initial 06/25/2011  . Atrial fibrillation ([place]) 10/31/2010  . HTN (hypertension) 10/31/2010  . 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 Hypothyroidism 12/15/2006  . HLD (hyperlipidemia) 12/15/2006  . MIGRAINE HEADACHE 12/15/2006  . GERD (gastroesophageal reflux disease) 12/15/2006  . DIVERTICULOSIS, COLON 12/15/2006  . Osteoporosis 12/15/2006    Past Surgical History:  Procedure Laterality Date  . ABDOMINAL HYSTERECTOMY    . ANAL FISSURE REPAIR    . CATARACT EXTRACTION W/PHACO Right 02/06/2018   Procedure: CATARACT EXTRACTION PHACO AND INTRAOCULAR LENS PLACEMENT ([place]) RIGHT;  Surgeon: Leandrew Koyanagi, MD;  Location: [place];  Service: Ophthalmology;  Laterality: Right;  . CHOLECYSTECTOMY  07/2006  . 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 nasolabial fold skin flap surgery  08/02/2007   [facility]  . PARTIAL HYSTERECTOMY  1978-79  . POLYPECTOMY  04/06/97   colon, benign by pathology  . varicose vein ablation sclerotherapy  09/2008   Dr. Hulda Humphrey    OB History   No obstetric history on file.      Home Medications    Prior to Admission medications   Medication Sig  Start Date End Date Taking? Authorizing Provider  albuterol (VENTOLIN HFA) 108 (90 Base) MCG/ACT inhaler Inhale 2 puffs into the lungs every 6 (six) hours as needed for wheezing or shortness of breath. 12/18/18  Yes Icard, Octavio Graves, DO  Cholecalciferol (VITAMIN D3) 50 MCG (2000 UT) capsule Take 2 capsules (4,000 Units total) by mouth daily. 08/22/19  Yes Tonia Ghent, MD  diltiazem (CARDIZEM) 30 MG tablet TAKE ONE TABLET BY MOUTH THREE TIMES DAILY AS NEEDED 08/05/20  Yes Tonia Ghent, MD  diphenhydrAMINE (BENADRYL) 25 MG tablet Take 25 mg by mouth every  8 (eight) hours as needed for itching.   Yes [provider]  folic acid (FOLVITE) 1 MG tablet Take 1 mg by mouth daily. 06/07/20  Yes [provider]  ibuprofen (ADVIL) 600 MG tablet Take 600 mg by mouth 3 (three) times daily. 12/25/19  Yes [provider]  levothyroxine (SYNTHROID) 150 MCG tablet Take 1 tablet (150 mcg total) by mouth daily. Except for 1.5 on Sunday 08/22/19  Yes Tonia Ghent, MD  loperamide (IMODIUM A-D) 2 MG tablet Take 2 mg by mouth 2 (two) times daily as needed for diarrhea or loose stools.   Yes [provider]  methotrexate 2.5 MG tablet TAKE 5 TABLETS BY MOUTH ONCE A WEEK - TAKE FOLIC ACID ALL OTHER DAYS OF THE WEEK 07/16/20  Yes [provider]  omeprazole (PRILOSEC) 20 MG capsule Take 1 capsule (20 mg total) by mouth daily. 08/22/19  Yes Tonia Ghent, MD  ondansetron (ZOFRAN ODT) 4 MG disintegrating tablet Take 1 tablet (4 mg total) by mouth every 8 (eight) hours as needed for nausea or vomiting. 08/09/20  Yes Tabetha Haraway G, DO  promethazine (PHENERGAN) 25 MG tablet TAKE ONE TABLET BY MOUTH EVERY 6 HOURS AS NEEDED FOR nausea 04/14/20  Yes Tonia Ghent, MD  propranolol (INNOPRAN XL) 120 MG 24 hr capsule Take by mouth. 09/18/19  Yes [provider]  propranolol ER (INDERAL LA) 120 MG 24 hr capsule Take 1 capsule (120 mg total) by mouth daily. 08/22/19  Yes Tonia Ghent, MD  rivaroxaban (XARELTO) 20 MG TABS tablet TAKE 1 TABLET BY MOUTH ONCE DAILY WITH  SUPPER 08/22/19  Yes Tonia Ghent, MD  rosuvastatin (CRESTOR) 5 MG tablet TAKE ONE TABLET BY MOUTH AT BEDTIME 07/14/20  Yes Tonia Ghent, MD  traMADol (ULTRAM) 50 MG tablet TAKE 1 TABLET BY MOUTH EVERY 8 HOURS AS NEEDED...CAUTION SEDATION 11/12/19  Yes Tonia Ghent, MD    Family History Family History  Problem Relation Age of Onset  . Hypertension Mother   . Osteopenia Mother   . Stroke Mother        deceased age 74  . Colon cancer Father 60       mets   . Allergies Sister   . Breast cancer Cousin        pat cousin  . Rectal cancer Neg Hx     Social History Social History   Tobacco Use  . Smoking status: Former Smoker    Packs/day: 1.00    Quit date: 06/26/1986    Years since quitting: 34.1  . Smokeless tobacco: Never Used  Vaping Use  . Vaping Use: Never used  Substance Use Topics  . Alcohol use: No  . Drug use: No     Allergies   Celebrex [celecoxib], Doxycycline, Gold-containing drug products, Nickel, Other, Sodium metabisulfite, and Zocor [simvastatin]   Review of Systems Review of  Systems Per HPI  Physical Exam Triage Vital Signs ED Triage Vitals  Enc Vitals Group     BP 08/09/20 1639 (!) 145/110     Pulse Rate 08/09/20 1639 75     Resp 08/09/20 1639 18     Temp 08/09/20 1639 97.8 F (36.6 C)     Temp Source 08/09/20 1639 Oral     SpO2 08/09/20 1639 97 %     Weight 08/09/20 1637 226 lb (102.5 kg)     Height 08/09/20 1637 5\' 4"  (1.626 m)     Head Circumference --      Peak Flow --      Pain Score 08/09/20 1637 0     Pain Loc --      Pain Edu? --      Excl. in [place]? --    Updated Vital Signs BP (!) 145/110 (BP Location: Right Arm)   Pulse 75   Temp 97.8 F (36.6 C) (Oral)   Resp 18   Ht 5\' 4"  (1.626 m)   Wt 102.5 kg   SpO2 97%   BMI 38.79 kg/m   Visual Acuity Right Eye Distance:   Left Eye Distance:   Bilateral Distance:    Right Eye Near:   Left Eye Near:    Bilateral Near:     Physical Exam Vitals and nursing note reviewed.  Constitutional:      General: She is not in acute distress.    Appearance: Normal appearance. She is not ill-appearing.  HENT:     Head: Normocephalic and atraumatic.  Eyes:     General:        Right eye: No discharge.     Conjunctiva/sclera: Conjunctivae normal.  Cardiovascular:     Rate and Rhythm: Normal rate and regular rhythm.  Pulmonary:     Effort: Pulmonary effort is normal.     Breath sounds: Normal breath sounds. No wheezing, rhonchi or rales.   Abdominal:     General: There is no distension.     Palpations: Abdomen is soft.     Tenderness: There is no abdominal tenderness.  Neurological:     Mental Status: She is alert.  Psychiatric:        Mood and Affect: Mood normal.        Behavior: Behavior normal.    UC Treatments / Results  Labs (all labs ordered are listed, but only abnormal results are displayed) Labs Reviewed  CBC WITH DIFFERENTIAL/PLATELET - Abnormal; Notable for the following components:      Result Value   RBC 5.47 (*)    MCV 79.3 (*)    MCH 25.4 (*)    RDW 16.1 (*)    All other components within normal limits  COMPREHENSIVE METABOLIC PANEL - Abnormal; Notable for the following components:   Chloride 97 (*)    Glucose, Bld 106 (*)    Creatinine, Ser 1.10 (*)    GFR, Estimated 53 (*)    All other components within normal limits  LIPASE, BLOOD    EKG   Radiology No results found.  Procedures Procedures (including critical care time)  Medications Ordered in UC Medications  sodium chloride 0.9 % bolus 1,000 mL (0 mLs Intravenous Stopped 08/09/20 1919)    Initial Impression / Assessment and Plan / UC Course  I have reviewed the triage vital signs and the nursing notes.  Pertinent labs & imaging results that were available during my care of the patient were reviewed by me and  considered in my medical decision making (see chart for details).    75 year old female presents with gastroenteritis and dehydration.  IV fluids given today.  Laboratory studies notable for mild elevated creatinine consistent with dehydration.  Sending home on Zofran.  Supportive care.  Final Clinical Impressions(s) / UC Diagnoses   Final diagnoses:  Gastroenteritis  Dehydration     Discharge Instructions     Medication as prescribed.   Lots of fluids.  Take care  Dr. Lacinda Axon     ED Prescriptions    Medication Sig Dispense Auth. Provider   ondansetron (ZOFRAN ODT) 4 MG disintegrating tablet Take 1 tablet  (4 mg total) by mouth every 8 (eight) hours as needed for nausea or vomiting. 20 tablet Coral Spikes, DO     PDMP not reviewed this encounter.   Coral Spikes, [place] 08/09/20 2105

## 2020-08-09 NOTE — ED Triage Notes (Signed)
Patient states that she has been having vomitiing and diarrhea with loss of appetite since Thursday. Patient states that she did keep Jello down today and has been drinking gingerale and pepsi.

## 2020-08-09 NOTE — Telephone Encounter (Signed)
Patient called stating that she started with symptoms late Thursday night. Patient stated that she was vomiting had  diarrhea, fever and chills. Patient stated the vomiting stopped sometime on Saturday. Patient stated that she has only had a bowl of cream of chicken soup and pudding since Thursday. Patient stated that she is able to tolerate ginger ale. Patient stated that she continues with diarrhea. Patient stated that she has not checked her temperature since Thursday night. Patient stated that she is urinating but not as much as she thinks that she should with what she is drinking. Patient stated that her mouth has been dry at night. Patient was advised that one concern would be dehydration. Patient was advised that she should be evaluated with her symptoms. Patient stated that she will go to an UC but will not go to the ER. Patient was given  information on the [facility]. Patient stated that she will plan on going to the UC today.

## 2020-08-09 NOTE — Discharge Instructions (Signed)
Medication as prescribed.  Lots of fluids.  Take care  Dr. Hephzibah Strehle  

## 2020-08-09 NOTE — Telephone Encounter (Signed)
Currently at [facility].

## 2020-08-10 ENCOUNTER — Other Ambulatory Visit: Payer: Self-pay | Admitting: Family Medicine

## 2020-08-10 NOTE — Telephone Encounter (Signed)
Pharmacy requests refill on: Promethazine 25 mg   LAST REFILL: 04/14/2020 (Q-30, R-3) LAST OV: 12/15/2019 NEXT OV: 08/30/2020 PHARMACY: [place]

## 2020-08-11 NOTE — Telephone Encounter (Signed)
Sent. Thanks.   

## 2020-08-11 NOTE — Telephone Encounter (Signed)
Thanks

## 2020-08-11 NOTE — Telephone Encounter (Signed)
Please get update on patient.  Thanks. 

## 2020-08-11 NOTE — Telephone Encounter (Signed)
Spoke with patient and she states she is doing much better after going to be seen. States she was given IV fluids and some other nausea meds. She has not had any more sx.

## 2020-08-15 ENCOUNTER — Other Ambulatory Visit: Payer: Self-pay | Admitting: Family Medicine

## 2020-08-15 DIAGNOSIS — M810 Age-related osteoporosis without current pathological fracture: Secondary | ICD-10-CM

## 2020-08-15 DIAGNOSIS — E039 Hypothyroidism, unspecified: Secondary | ICD-10-CM

## 2020-08-15 DIAGNOSIS — I480 Paroxysmal atrial fibrillation: Secondary | ICD-10-CM

## 2020-08-15 DIAGNOSIS — E7849 Other hyperlipidemia: Secondary | ICD-10-CM

## 2020-08-15 DIAGNOSIS — E559 Vitamin D deficiency, unspecified: Secondary | ICD-10-CM

## 2020-08-17 DIAGNOSIS — G4733 Obstructive sleep apnea (adult) (pediatric): Secondary | ICD-10-CM | POA: Diagnosis not present

## 2020-08-27 ENCOUNTER — Other Ambulatory Visit: Payer: Self-pay

## 2020-08-27 ENCOUNTER — Ambulatory Visit: Payer: PPO

## 2020-08-27 ENCOUNTER — Other Ambulatory Visit: Payer: PPO

## 2020-08-30 ENCOUNTER — Telehealth: Payer: PPO | Admitting: Family Medicine

## 2020-08-30 ENCOUNTER — Other Ambulatory Visit: Payer: Self-pay

## 2020-09-01 NOTE — Progress Notes (Signed)
Visit canceled

## 2020-09-03 ENCOUNTER — Telehealth: Payer: Self-pay

## 2020-09-03 NOTE — Telephone Encounter (Signed)
[name] - Client TELEPHONE ADVICE RECORD [facility] Patient Name: Kara Wright Gender: Female DOB: 1946/06/06 Age: 75 Y 69 M 7 D Return Phone Number: [phone] (Primary), [phone] (Secondary) Address: [address] City/State/Zip: [place] [address] Client [name] - Client Client Site [facility] - Day Physician Renford Dills - MD Contact Type Call Who Is Calling Patient / Member / Family / Caregiver Call Type Triage / Clinical Relationship To Patient Self Return Phone Number [phone] (Secondary) Chief Complaint Cough Reason for Call Symptomatic / Request for Kara Wright states she is experiencing a cough with congestion. Translation No Disp. Time [name] Time) Disposition Final User 09/03/2020 1:10:21 PM Attempt made - message left Dew, RN, [name] 09/03/2020 1:22:07 PM Attempt made - message left Dew, RN, [name] 09/03/2020 1:34:16 PM FINAL ATTEMPT MADE - message left

## 2020-09-03 NOTE — Telephone Encounter (Signed)
Spoke with patient who stated that she had been experiencing cough, chest congestion, and mild nausea for the past month. Patient states that when she coughs up the chest congestion it is a yellow/tan color. This has caused her to obtain minimal amount of sleep at night. Patient denied fever, chills, SOB, chest pain, vomiting, diarrhea, loss of taste or smell. Patient stated that she has not been exposed to or tested positive for COVID. Appointment scheduled for tomorrow (3/19) for a virtual visit. UC and ED precautions given. Patient verbalized understanding.

## 2020-09-04 ENCOUNTER — Telehealth (INDEPENDENT_AMBULATORY_CARE_PROVIDER_SITE_OTHER): Payer: PPO | Admitting: Internal Medicine

## 2020-09-04 ENCOUNTER — Encounter: Payer: Self-pay | Admitting: Internal Medicine

## 2020-09-04 VITALS — Temp 99.4°F

## 2020-09-04 DIAGNOSIS — J329 Chronic sinusitis, unspecified: Secondary | ICD-10-CM | POA: Diagnosis not present

## 2020-09-04 DIAGNOSIS — J40 Bronchitis, not specified as acute or chronic: Secondary | ICD-10-CM

## 2020-09-04 DIAGNOSIS — J069 Acute upper respiratory infection, unspecified: Secondary | ICD-10-CM | POA: Diagnosis not present

## 2020-09-04 MED ORDER — AMOXICILLIN-POT CLAVULANATE 875-125 MG PO TABS: 1.0000 | ORAL_TABLET | Freq: Two times a day (BID) | ORAL | 0 refills | Status: DC

## 2020-09-04 NOTE — Progress Notes (Signed)
Virtual Visit via Video Note  I connected with@ on 09/04/20 at 10:50 AM EDT by a video enabled telemedicine application and verified that I am speaking with the correct person using two identifiers. Location patient: home Location provider:work office Persons participating in the virtual visit: patient, provider  [place]th national recommendations  regarding COVID 19 pandemic   video visit is advised over in office visit for this patient.  Patient aware  of the limitations of evaluation and management by telemedicine and  availability of in person appointments. and agreed to proceed.   HPI: Kara Wright presents for video visit  today for [facility] Saturday clinic for  new problem evaluation.  onset  4 weeks and cough and congested low grade temp  And that is better but ongoing sx and   In past weeks   Used  Pharmacy  advised deslym  DM.  A bit better  Onset with slight fever.  Pain from coughing  Ribs .   On going  Nasal congestion  Mostly clear and pressure    No sob x some wheezing .   No hx of  Asthma  But has   Inhalers from past  handt used for this  Has osa   Past hx of covid  No testing for this part illness  No other sx  ROS: See pertinent positives and negatives per HPI. No fever chills sob hemoptysis   Past Medical History:  Diagnosis Date  . Atrial fib/flutter, transient    1 episode, resolved, never on coumadin  . Cataract 2019   left eye  . Clotting disorder ([place])    on xarelto  . Diverticulosis 03/1997  . Dysrhythmia    atrial fibrilation  . GERD (gastroesophageal reflux disease) 1997   good on prilosec  . Hepatitis    hx of hep B (late 1970s)  . Hyperlipidemia 08/1996  . Hypothyroidism 1984  . Migraine    w/o aura  . Osteopenia 11/2002   via dexa  . Presence of dental prosthetic device    temporary while waiting for implant  . Skin cancer    skin cancer  . Stroke [facility]) 08/2011   TIA    Past Surgical History:  Procedure Laterality Date  . ABDOMINAL  HYSTERECTOMY    . ANAL FISSURE REPAIR    . CATARACT EXTRACTION W/PHACO Right 02/06/2018   Procedure: CATARACT EXTRACTION PHACO AND INTRAOCULAR LENS PLACEMENT ([place]) RIGHT;  Surgeon: Leandrew Koyanagi, MD;  Location: [place];  Service: Ophthalmology;  Laterality: Right;  . CHOLECYSTECTOMY  07/2006  . 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 nasolabial fold skin flap surgery  08/02/2007   [facility]  . PARTIAL HYSTERECTOMY  1978-79  . POLYPECTOMY  04/06/97   colon, benign by pathology  . varicose vein ablation sclerotherapy  09/2008   Dr. Hulda Humphrey    Family History  Problem Relation Age of Onset  . Hypertension Mother   . Osteopenia Mother   . Stroke Mother        deceased age 64  . Colon cancer Father 60       mets  . Allergies Sister   . Breast cancer Cousin        pat cousin  . Rectal cancer Neg Hx     Social History   Tobacco Use  . Smoking status: Former Smoker    Packs/day: 1.00    Quit date: 06/26/1986    Years since quitting:  34.2  . Smokeless tobacco: Never Used  Vaping Use  . Vaping Use: Never used  Substance Use Topics  . Alcohol use: No  . Drug use: No      Current Outpatient Medications:  .  albuterol (VENTOLIN HFA) 108 (90 Base) MCG/ACT inhaler, Inhale 2 puffs into the lungs every 6 (six) hours as needed for wheezing or shortness of breath., Disp: 8 g, Rfl: 3 .  amoxicillin-clavulanate (AUGMENTIN) 875-125 MG tablet, Take 1 tablet by mouth every 12 (twelve) hours., Disp: 14 tablet, Rfl: 0 .  Cholecalciferol (VITAMIN D3) 50 MCG (2000 UT) capsule, Take 2 capsules (4,000 Units total) by mouth daily., Disp: 180 capsule, Rfl: 3 .  diltiazem (CARDIZEM) 30 MG tablet, TAKE ONE TABLET BY MOUTH THREE TIMES DAILY AS NEEDED, Disp: 90 tablet, Rfl: 1 .  diphenhydrAMINE (BENADRYL) 25 MG tablet, Take 25 mg by mouth every 8 (eight) hours as needed for itching., Disp: , Rfl:  .  folic acid (FOLVITE) 1 MG tablet, Take 1 mg by mouth daily.,  Disp: , Rfl:  .  ibuprofen (ADVIL) 600 MG tablet, Take 600 mg by mouth as needed., Disp: , Rfl:  .  levothyroxine (SYNTHROID) 150 MCG tablet, Take 1 tablet (150 mcg total) by mouth daily. Except for 1.5 on Sunday, Disp: 100 tablet, Rfl: 3 .  loperamide (IMODIUM A-D) 2 MG tablet, Take 2 mg by mouth 2 (two) times daily as needed for diarrhea or loose stools., Disp: , Rfl:  .  methotrexate 2.5 MG tablet, TAKE 5 TABLETS BY MOUTH ONCE A WEEK - TAKE FOLIC ACID ALL OTHER DAYS OF THE WEEK, Disp: , Rfl:  .  omeprazole (PRILOSEC) 20 MG capsule, Take 1 capsule (20 mg total) by mouth daily., Disp: 90 capsule, Rfl: 3 .  promethazine (PHENERGAN) 25 MG tablet, TAKE ONE TABLET EVERY 6 HOURS AS NEEDED FOR NAUSEA, Disp: 30 tablet, Rfl: 3 .  propranolol (INNOPRAN XL) 120 MG 24 hr capsule, Take by mouth., Disp: , Rfl:  .  propranolol ER (INDERAL LA) 120 MG 24 hr capsule, Take 1 capsule (120 mg total) by mouth daily., Disp: 90 capsule, Rfl: 3 .  rivaroxaban (XARELTO) 20 MG TABS tablet, TAKE 1 TABLET BY MOUTH ONCE DAILY WITH  SUPPER, Disp: 90 tablet, Rfl: 3 .  rosuvastatin (CRESTOR) 5 MG tablet, TAKE ONE TABLET BY MOUTH AT BEDTIME, Disp: 30 tablet, Rfl: 2 .  traMADol (ULTRAM) 50 MG tablet, TAKE 1 TABLET BY MOUTH EVERY 8 HOURS AS NEEDED...CAUTION SEDATION, Disp: 90 tablet, Rfl: 0  EXAM: BP Readings from Last 3 Encounters:  08/09/20 (!) 145/110  07/22/20 126/78  04/08/20 126/80    VITALS per patient if applicable:  GENERAL: alert, oriented, appears well and in no acute distress congested  Upper  No edema  ocass cough sounds bronchial  Good color   HEENT: atraumatic, conjunttiva clear, no obvious abnormalities on inspection of external nose and ears  NECK: normal movements of the head and neck  LUNGS: on inspection no signs of respiratory distress, breathing rate appears normal, no obvious gross SOB, gasping or wheezing  CV: no obvious cyanosis  PSYCH/NEURO: pleasant and cooperative, no obvious depression or  anxiety, speech and thought processing grossly intact Lab Results  Component Value Date   WBC 7.7 08/09/2020   HGB 13.9 08/09/2020   HCT 43.4 08/09/2020   PLT 210 08/09/2020   GLUCOSE 106 (H) 08/09/2020   CHOL 106 08/13/2019   TRIG 119.0 08/13/2019   HDL 38.00 (L) 08/13/2019  LDLDIRECT 94.9 06/15/2011   LDLCALC 44 08/13/2019   ALT 18 08/09/2020   AST 23 08/09/2020   NA 135 08/09/2020   K 3.7 08/09/2020   CL 97 (L) 08/09/2020   CREATININE 1.10 (H) 08/09/2020   BUN 22 08/09/2020   CO2 28 08/09/2020   TSH 1.60 08/13/2019   INR 1.01 08/28/2011   HGBA1C 5.5 08/13/2019    ASSESSMENT AND PLAN:  Discussed the following assessment and plan:    ICD-10-CM   1. Protracted URI  J06.9   2. Sinusitis, unspecified chronicity, unspecified location  J32.9   3. Wheezy bronchitis  J40    Adding Augmentin for probable sinusitis or secondary infection at her inhaler as needed for wheezing saline Flonase can continue the Delsym DM follow-up with alarm signals has  Underlying risk  She may want to get Covid tested even though chances are low and she should have hybrid immunity she has a CPX appointment in about 10 days  Counseled.   Expectant management and discussion of plan and treatment with opportunity to ask questions and all were answered. The patient agreed with the plan and demonstrated an understanding of the instructions.   Advised to call back or seek an in-person evaluation if worsening  or having  further concerns . Return if symptoms worsen or fail to improve as expected.    Shanon Ace, MD

## 2020-09-05 NOTE — Telephone Encounter (Signed)
Noted. Thanks.

## 2020-09-07 ENCOUNTER — Ambulatory Visit: Payer: PPO | Admitting: Physician Assistant

## 2020-09-07 ENCOUNTER — Telehealth: Payer: PPO | Admitting: Family Medicine

## 2020-09-07 ENCOUNTER — Other Ambulatory Visit: Payer: Self-pay | Admitting: Family Medicine

## 2020-09-12 DIAGNOSIS — R0789 Other chest pain: Secondary | ICD-10-CM | POA: Diagnosis not present

## 2020-09-12 DIAGNOSIS — S8001XA Contusion of right knee, initial encounter: Secondary | ICD-10-CM | POA: Diagnosis not present

## 2020-09-12 DIAGNOSIS — M1711 Unilateral primary osteoarthritis, right knee: Secondary | ICD-10-CM | POA: Diagnosis not present

## 2020-09-12 DIAGNOSIS — Z043 Encounter for examination and observation following other accident: Secondary | ICD-10-CM | POA: Diagnosis not present

## 2020-09-12 DIAGNOSIS — M79631 Pain in right forearm: Secondary | ICD-10-CM | POA: Diagnosis not present

## 2020-09-12 DIAGNOSIS — R0781 Pleurodynia: Secondary | ICD-10-CM | POA: Diagnosis not present

## 2020-09-12 DIAGNOSIS — M25561 Pain in right knee: Secondary | ICD-10-CM | POA: Diagnosis not present

## 2020-09-12 DIAGNOSIS — S20211A Contusion of right front wall of thorax, initial encounter: Secondary | ICD-10-CM | POA: Diagnosis not present

## 2020-09-12 DIAGNOSIS — R1011 Right upper quadrant pain: Secondary | ICD-10-CM | POA: Diagnosis not present

## 2020-09-13 ENCOUNTER — Other Ambulatory Visit (INDEPENDENT_AMBULATORY_CARE_PROVIDER_SITE_OTHER): Payer: PPO

## 2020-09-13 ENCOUNTER — Other Ambulatory Visit: Payer: Self-pay

## 2020-09-13 DIAGNOSIS — E7849 Other hyperlipidemia: Secondary | ICD-10-CM

## 2020-09-13 DIAGNOSIS — E559 Vitamin D deficiency, unspecified: Secondary | ICD-10-CM | POA: Diagnosis not present

## 2020-09-13 DIAGNOSIS — E039 Hypothyroidism, unspecified: Secondary | ICD-10-CM | POA: Diagnosis not present

## 2020-09-13 DIAGNOSIS — Z043 Encounter for examination and observation following other accident: Secondary | ICD-10-CM | POA: Diagnosis not present

## 2020-09-13 DIAGNOSIS — R1011 Right upper quadrant pain: Secondary | ICD-10-CM | POA: Diagnosis not present

## 2020-09-13 DIAGNOSIS — I480 Paroxysmal atrial fibrillation: Secondary | ICD-10-CM | POA: Diagnosis not present

## 2020-09-13 DIAGNOSIS — M810 Age-related osteoporosis without current pathological fracture: Secondary | ICD-10-CM

## 2020-09-13 LAB — BASIC METABOLIC PANEL
BUN: 8 mg/dL (ref 6–23)
CO2: 31 mEq/L (ref 19–32)
Calcium: 9.2 mg/dL (ref 8.4–10.5)
Chloride: 103 mEq/L (ref 96–112)
Creatinine, Ser: 0.62 mg/dL (ref 0.40–1.20)
GFR: 87.91 mL/min (ref >60.00)
Glucose, Bld: 113 mg/dL — ABNORMAL HIGH (ref 70–99)
Potassium: 4 mEq/L (ref 3.5–5.1)
Sodium: 140 mEq/L (ref 135–145)

## 2020-09-13 LAB — LIPID PANEL
Cholesterol: 110 mg/dL (ref 0–200)
HDL: 34.7 mg/dL — ABNORMAL LOW (ref >39.00)
LDL Cholesterol: 45 mg/dL (ref 0–99)
NonHDL: 75.73
Total CHOL/HDL Ratio: 3
Triglycerides: 154 mg/dL — ABNORMAL HIGH (ref 0.0–149.0)
VLDL: 30.8 mg/dL (ref 0.0–40.0)

## 2020-09-13 LAB — HEMOGLOBIN A1C: Hgb A1c MFr Bld: 5.6 % (ref 4.6–6.5)

## 2020-09-13 LAB — VITAMIN D 25 HYDROXY (VIT D DEFICIENCY, FRACTURES): VITD: 36.15 ng/mL (ref 30.00–100.00)

## 2020-09-13 LAB — TSH: TSH: 5.5 u[IU]/mL — ABNORMAL HIGH (ref 0.35–4.50)

## 2020-09-16 ENCOUNTER — Other Ambulatory Visit: Payer: Self-pay

## 2020-09-16 ENCOUNTER — Encounter: Payer: Self-pay | Admitting: Family Medicine

## 2020-09-16 ENCOUNTER — Ambulatory Visit (INDEPENDENT_AMBULATORY_CARE_PROVIDER_SITE_OTHER): Payer: PPO | Admitting: Family Medicine

## 2020-09-16 VITALS — BP 132/80 | HR 70 | Temp 97.6°F | Ht 63.0 in | Wt 222.0 lb

## 2020-09-16 DIAGNOSIS — E7849 Other hyperlipidemia: Secondary | ICD-10-CM | POA: Diagnosis not present

## 2020-09-16 DIAGNOSIS — M810 Age-related osteoporosis without current pathological fracture: Secondary | ICD-10-CM | POA: Diagnosis not present

## 2020-09-16 DIAGNOSIS — R0789 Other chest pain: Secondary | ICD-10-CM | POA: Diagnosis not present

## 2020-09-16 DIAGNOSIS — I480 Paroxysmal atrial fibrillation: Secondary | ICD-10-CM

## 2020-09-16 DIAGNOSIS — R911 Solitary pulmonary nodule: Secondary | ICD-10-CM | POA: Diagnosis not present

## 2020-09-16 DIAGNOSIS — Z0000 Encounter for general adult medical examination without abnormal findings: Secondary | ICD-10-CM

## 2020-09-16 DIAGNOSIS — Z7189 Other specified counseling: Secondary | ICD-10-CM

## 2020-09-16 DIAGNOSIS — E039 Hypothyroidism, unspecified: Secondary | ICD-10-CM

## 2020-09-16 MED ORDER — LEVOTHYROXINE SODIUM 150 MCG PO TABS: ORAL_TABLET | ORAL | 3 refills | Status: DC

## 2020-09-16 MED ORDER — TRAMADOL HCL 50 MG PO TABS: 50.0000 mg | ORAL_TABLET | Freq: Two times a day (BID) | ORAL | 1 refills | Status: DC | PRN

## 2020-09-16 MED ORDER — ROSUVASTATIN CALCIUM 5 MG PO TABS: 5.0000 mg | ORAL_TABLET | Freq: Every day | ORAL | 3 refills | Status: DC

## 2020-09-16 NOTE — Patient Instructions (Signed)
Increase your thyroid medicine and recheck TSH in about 2 months at a lab visit. Please schedule when possible.  Let me know if the tramadol isn't helping the pain.  Okay to take with tylenol.  Take care.  Glad to see you.

## 2020-09-16 NOTE — Progress Notes (Signed)
This visit occurred during the SARS-CoV-2 public health emergency.  Safety protocols were in place, including screening questions prior to the visit, additional usage of staff PPE, and extensive cleaning of exam room while observing appropriate contact time as indicated for disinfecting solutions.  I have personally reviewed the Medicare Annual Wellness questionnaire and have noted 1. The patient's medical and social history 2. Their use of alcohol, tobacco or illicit drugs 3. Their current medications and supplements 4. The patient's functional ability including ADL's, fall risks, home safety risks and hearing or visual             impairment. 5. Diet and physical activities 6. Evidence for depression or mood disorders  The patients weight, height, BMI have been recorded in the chart and visual acuity is per eye clinic.  I have made referrals, counseling and provided education to the patient based review of the above and I have provided the pt with a written personalized care plan for preventive services.  Provider list updated- see scanned forms.  Routine anticipatory guidance given to patient.  See health maintenance. The possibility exists that previously documented standard health maintenance information may have been brought forward from a previous encounter into this note.  If needed, that same information has been updated to reflect the current situation based on today's encounter.    Flu up-to-date Shingles up-to-date PNA up-to-date Tetanus up-to-date COVID vaccine up-to-date Colonoscopy 2019 Breast cancer screening 2021 Bone density test 2022 Advance directive-daughter Chantelle Burns designated if patient were incapacitated. Cognitive function addressed- see scanned forms- and if abnormal then additional documentation follows.   She is caring for her husband who has parkinsons.  D/w pt.  He has fallen multiple times.  She has PT and nursing coming into the home in the meantime.     Elevated Cholesterol: Using medications without problems: yes Muscle aches: not from statin.   Diet compliance: d/w pt.   Exercise: d/w pt.   Labs d/w pt.    TSH up slightly.  Still on replacement.  No neck mass, no dysphagia.    Afib d/w pt   Still anticoagulated.    Using medication without problems or lightheadedness:  yes Chest pain with exertion: chest wall pain from fall but not o/w.   Edema: no Short of breath: noted since covid, not worse now.    Sugar up slightly, A1c still normal.    Memory testing today is normal.  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  She fell recently, d/w pt about ER eval.  Still with R sided lower rib pain.  She can take a deep breath but with discomfort.  Not much pain at rest.  Tramadol and tylenol help.  D/w pt.  D/w pt about prn SABA use for cough and chest congestion.  She is done with abx course.  No fevers.   Ct from 06/2020.   IMPRESSION:  1. There are fewer waxing and waning bilateral lung nodules, as  described above. The majority of the multiple nodules seen at both  lung bases on 01/05/2020 are no longer present. There is 1 residual  nodule in the left upper lobe measuring 1.0 cm in maximum diameter.  There is an interval 6 mm maximum diameter nodule in the medial left  lower lobe. There is also a 1.0 cm nodule in the left lower lobe at  the left lung base. A follow-up chest CT in 6 months is recommended.  2. Mild right superior  mediastinal adenopathy, slightly less  prominent.  3. Stable enlarged central pulmonary arteries, compatible with  chronic pulmonary arterial hypertension.  4. Calcific coronary artery and aortic atherosclerosis.  Aortic Atherosclerosis (ICD10-I70.0).  FRAX elevated.  dw pt.  The probability of a major osteoporotic fracture is 31.4% within the next ten years.  The probability of a hip fracture is 16.1% within the next ten  years. Fosamax d/w pt.  D/w pt about DXA results  and osteoporosis path/phys in general, including vit D and calcium.  Reasonable to consider treatment with bisphosphonate, ie fosamax.  D/w pt about risk benefit, especially GI sx, jaw and long bone pathology.   Would defer tx now given other issues above.  She agrees.    We talked about her previous chest and pelvic imaging and I told her I wanted to consider options/talk with Dr. Valeta Harms before made any other plans.  PMH and SH reviewed  Meds, vitals, and allergies reviewed.   ROS: Per HPI.  Unless specifically indicated otherwise in HPI, the patient denies:  General: fever. Eyes: acute vision changes ENT: sore throat Cardiovascular: chest pain Respiratory: SOB GI: vomiting GU: dysuria Musculoskeletal: acute back pain Derm: acute rash Neuro: acute motor dysfunction Psych: worsening mood Endocrine: polydipsia Heme: bleeding Allergy: hayfever  GEN: nad, alert and oriented HEENT: ncat NECK: supple w/o LA CV: rrr. PULM: ctab, no inc wob ABD: soft, +bs EXT: no edema SKIN: Well-perfused.

## 2020-09-17 ENCOUNTER — Ambulatory Visit: Payer: PPO | Admitting: Physician Assistant

## 2020-09-19 DIAGNOSIS — I2510 Atherosclerotic heart disease of native coronary artery without angina pectoris: Secondary | ICD-10-CM | POA: Diagnosis not present

## 2020-09-19 DIAGNOSIS — I119 Hypertensive heart disease without heart failure: Secondary | ICD-10-CM | POA: Diagnosis not present

## 2020-09-19 DIAGNOSIS — I4891 Unspecified atrial fibrillation: Secondary | ICD-10-CM | POA: Diagnosis not present

## 2020-09-19 DIAGNOSIS — R911 Solitary pulmonary nodule: Secondary | ICD-10-CM | POA: Diagnosis not present

## 2020-09-19 DIAGNOSIS — K219 Gastro-esophageal reflux disease without esophagitis: Secondary | ICD-10-CM | POA: Diagnosis not present

## 2020-09-19 DIAGNOSIS — G4733 Obstructive sleep apnea (adult) (pediatric): Secondary | ICD-10-CM | POA: Diagnosis not present

## 2020-09-19 DIAGNOSIS — E785 Hyperlipidemia, unspecified: Secondary | ICD-10-CM | POA: Diagnosis not present

## 2020-09-19 DIAGNOSIS — S2231XA Fracture of one rib, right side, initial encounter for closed fracture: Secondary | ICD-10-CM | POA: Diagnosis not present

## 2020-09-19 DIAGNOSIS — I288 Other diseases of pulmonary vessels: Secondary | ICD-10-CM | POA: Diagnosis not present

## 2020-09-19 DIAGNOSIS — J449 Chronic obstructive pulmonary disease, unspecified: Secondary | ICD-10-CM | POA: Diagnosis not present

## 2020-09-19 DIAGNOSIS — Z7901 Long term (current) use of anticoagulants: Secondary | ICD-10-CM | POA: Diagnosis not present

## 2020-09-19 DIAGNOSIS — E039 Hypothyroidism, unspecified: Secondary | ICD-10-CM | POA: Diagnosis not present

## 2020-09-19 DIAGNOSIS — R0789 Other chest pain: Secondary | ICD-10-CM | POA: Insufficient documentation

## 2020-09-19 DIAGNOSIS — R0781 Pleurodynia: Secondary | ICD-10-CM | POA: Diagnosis not present

## 2020-09-19 DIAGNOSIS — Z9989 Dependence on other enabling machines and devices: Secondary | ICD-10-CM | POA: Diagnosis not present

## 2020-09-19 NOTE — Assessment & Plan Note (Signed)
FRAX elevated.  dw pt.  The probability of a major osteoporotic fracture is 31.4% within the next ten years.  The probability of a hip fracture is 16.1% within the next ten  years. Fosamax d/w pt.  D/w pt about DXA results and osteoporosis path/phys in general, including vit D and calcium.  Reasonable to consider treatment with bisphosphonate, ie fosamax.  D/w pt about risk benefit, especially GI sx, jaw and long bone pathology.   Would defer tx now given other issues above.  She agrees.

## 2020-09-19 NOTE — Assessment & Plan Note (Signed)
Increase levothyroxine to 1 pill a day except 2 pills on Sundays and recheck TSH in about 2 months.  She agrees.

## 2020-09-19 NOTE — Assessment & Plan Note (Signed)
Flu up-to-date Shingles up-to-date PNA up-to-date Tetanus up-to-date COVID vaccine up-to-date Colonoscopy 2019 Breast cancer screening 2021 Bone density test 2022 Advance directive-daughter Chantelle Burns designated if patient were incapacitated. Cognitive function addressed- see scanned forms- and if abnormal then additional documentation follows.

## 2020-09-19 NOTE — Assessment & Plan Note (Signed)
Reasonable to use tramadol as needed and update me if her pain is any worse.  Fall cautions discussed with patient.  She agrees with plan.  Continue Tylenol as needed.

## 2020-09-19 NOTE — Assessment & Plan Note (Signed)
We talked about her previous chest and pelvic imaging and I told her I wanted to consider options/talk with Dr. Valeta Harms before made any other plans.  She agreed with plan.

## 2020-09-19 NOTE — Assessment & Plan Note (Signed)
Would continue Crestor.  Labs discussed with patient.

## 2020-09-19 NOTE — Assessment & Plan Note (Signed)
History of.  Tolerating anticoagulation.  Would continue as is.  She agrees.  Fall cautions discussed with patient.

## 2020-09-19 NOTE — Assessment & Plan Note (Signed)
Advance directive-daughter Chantelle Burns designated if patient were incapacitated.

## 2020-09-20 ENCOUNTER — Telehealth: Payer: Self-pay | Admitting: Family Medicine

## 2020-09-20 DIAGNOSIS — R590 Localized enlarged lymph nodes: Secondary | ICD-10-CM

## 2020-09-20 NOTE — Telephone Encounter (Signed)
I think it depends on her level of pain control.  If she needs to give it a few days, then I support that.  Does her pain medicine help at all?

## 2020-09-20 NOTE — Telephone Encounter (Signed)
Please call pt.  I checked with Dr. Valeta Harms about her most recent imaging.  Reasonable to talk with interventional radiology for consideration of biopsy. I put in the referral.    Please get update on patient after ER eval.  Thanks.

## 2020-09-20 NOTE — Telephone Encounter (Deleted)
Please call pt.  I talked with Dr. Valeta Harms.  Reasonable to recheck CT abd/pelvis re: lymph notes. We'll call about scheduling.    I saw the CT chest done at ER.  Needs 6 months f/u for that.  I'll make a note in EMR.  Please get update on pain/symptoms at this point.  Thanks.

## 2020-09-20 NOTE — Addendum Note (Signed)
Addended by: Tonia Ghent on: 09/20/2020 09:42 AM   Modules accepted: Orders

## 2020-09-20 NOTE — Telephone Encounter (Signed)
Pt aware  Pt was having a lot of pain yesterday and was seen at [facility] ED - it was confirmed that the patient has a Broken rib - CT scan done (7th rib)  Pt is wanting to know if she needs to go ahead and move forward with this biopsy with IR now or if she can hold off until her rib heals. Patient denies any severe chest pain or SOB

## 2020-09-20 NOTE — Telephone Encounter (Signed)
Opened in error

## 2020-09-20 NOTE — Telephone Encounter (Signed)
LMTCB

## 2020-09-21 NOTE — Telephone Encounter (Signed)
Discussed below message with patient. Patient pain meds are helping her. Patient advised that someone will be calling to get her appt set up; patient is okay to precede.

## 2020-09-22 NOTE — Telephone Encounter (Signed)
1- Spoke with patient and she wants to go to [facility] for this procedure. I spoke with Marcie Bal at [facility] department and she needs to know what needs to be biopsied, this was not specified in the order. Dr Damita Dunnings please let me know. Then I will call them back with that information, depending on what needs to be considered for biopsy will depend on the order that needs to be put in (the one that is put in is not accurate) Marcie Bal will help me with ordering the right test. After that they will try to get images from [facility] ER because the Radiologist doctor has to look at those images. Once the radiologist approves to proceed they will order and schedule patient and will call us back with that information and we will advise patient. Patient is advised of this process. (not available on 09/27/20)  Call back to [place] at IR is [phone]  2-Patient also wanted me to let you know that her right arm is still hurting from the fall. She is worried that something is wrong like a fracture. She had xray of forearm area on 09/12/20 of that arm at [facility], but when she went back again due to rib pain she did not ask them to re image it and thinks she should have done that. Pain is mainly with certain movements of the arm, certain rotation. Pain described as a stabbing sudden pain. Some weakness in the arm but no numbness or tingling, no redness, not hot to the touch, some swelling present in the elbow down to wrist of the right arm. Tramadol and Tylenol is helping some but does not improve it 100%. I did offer patient an appointment today but she declined and wanted to wait a day or so to see if it improves.

## 2020-09-22 NOTE — Telephone Encounter (Signed)
1- Marcie Bal advised and she will work on Adult nurse from [facility] and having IR review. Will call me back with more information when able to  2-Patient advised to keep [place] posted about her arm.

## 2020-09-22 NOTE — Telephone Encounter (Signed)
1.  Goal would be to biopsy an enlarged lymph node that is accessible to IR.  CT at [facility] with retroperitoneal adenopathy.  I need IR input on the site that is best to biopsy.    2.  Please offer f/u as needed re: arm.  Thanks.

## 2020-09-23 ENCOUNTER — Ambulatory Visit: Payer: PPO | Admitting: Physician Assistant

## 2020-09-23 ENCOUNTER — Ambulatory Visit
Admission: RE | Admit: 2020-09-23 | Discharge: 2020-09-23 | Disposition: A | Discharge status: Home / Self Care | Payer: Self-pay | Source: Ambulatory Visit | Attending: Family Medicine | Admitting: Family Medicine

## 2020-09-23 ENCOUNTER — Telehealth: Payer: Self-pay | Admitting: Family Medicine

## 2020-09-23 ENCOUNTER — Other Ambulatory Visit: Payer: Self-pay

## 2020-09-23 ENCOUNTER — Encounter: Payer: Self-pay | Admitting: Physician Assistant

## 2020-09-23 ENCOUNTER — Other Ambulatory Visit: Payer: Self-pay | Admitting: Family Medicine

## 2020-09-23 VITALS — BP 132/78 | HR 58 | Ht 63.0 in | Wt 224.0 lb

## 2020-09-23 DIAGNOSIS — Z87891 Personal history of nicotine dependence: Secondary | ICD-10-CM

## 2020-09-23 DIAGNOSIS — I10 Essential (primary) hypertension: Secondary | ICD-10-CM

## 2020-09-23 DIAGNOSIS — R1031 Right lower quadrant pain: Secondary | ICD-10-CM

## 2020-09-23 DIAGNOSIS — R1900 Intra-abdominal and pelvic swelling, mass and lump, unspecified site: Secondary | ICD-10-CM

## 2020-09-23 DIAGNOSIS — Z9181 History of falling: Secondary | ICD-10-CM | POA: Diagnosis not present

## 2020-09-23 DIAGNOSIS — Z8673 Personal history of transient ischemic attack (TIA), and cerebral infarction without residual deficits: Secondary | ICD-10-CM

## 2020-09-23 DIAGNOSIS — E039 Hypothyroidism, unspecified: Secondary | ICD-10-CM | POA: Diagnosis not present

## 2020-09-23 DIAGNOSIS — I480 Paroxysmal atrial fibrillation: Secondary | ICD-10-CM

## 2020-09-23 DIAGNOSIS — G4730 Sleep apnea, unspecified: Secondary | ICD-10-CM

## 2020-09-23 NOTE — Telephone Encounter (Signed)
They are calling about questions in regards to biopsy referral . Please call back. EM

## 2020-09-23 NOTE — Patient Instructions (Signed)
Medication Instructions:  - Your physician recommends that you continue on your current medications as directed. Please refer to the Current Medication list given to you today.  *If you need a refill on your cardiac medications before your next appointment, please call your pharmacy*   Lab Work: - none ordered  If you have labs (blood work) drawn today and your tests are completely normal, you will receive your results only by: Marland Kitchen MyChart Message (if you have MyChart) OR . A paper copy in the mail If you have any lab test that is abnormal or we need to change your treatment, we will call you to review the results.   Testing/Procedures: - none ordered   Follow-Up: At [facility], you and your health needs are our priority.  As part of our continuing mission to provide you with exceptional heart care, we have created designated Provider Care Teams.  These Care Teams include your primary Cardiologist (physician) and Advanced Practice Providers (APPs -  Physician Assistants and Nurse Practitioners) who all work together to provide you with the care you need, when you need it.  We recommend signing up for the patient portal called "MyChart".  Sign up information is provided on this After Visit Summary.  MyChart is used to connect with patients for Virtual Visits (Telemedicine).  Patients are able to view lab/test results, encounter notes, upcoming appointments, etc.  Non-urgent messages can be sent to your provider as well.   To learn more about what you can do with MyChart, go to [email or web address].    Your next appointment:   1 year(s)  The format for your next appointment:   In Person  Provider:   You may see Ida Rogue, MD or one of the following Advanced Practice Providers on your designated Care Team:    Murray Hodgkins, NP  Christell Faith, PA-C  Marrianne Mood, PA-C  Cadence [place], [place]  Laurann Montana, NP    Other Instructions n/a

## 2020-09-23 NOTE — Telephone Encounter (Signed)
Phone number provided to Dr Damita Dunnings to call back. Dr Jacqualyn Posey is at [facility] today till 4:30 and tomorrow he will be working at a different hospital. Dr Damita Dunnings aware.

## 2020-09-23 NOTE — Telephone Encounter (Signed)
LMTCB

## 2020-09-23 NOTE — Telephone Encounter (Signed)
Called the listed number.  It was a pager.  No return call.  Please see what you can find out.

## 2020-09-23 NOTE — Progress Notes (Signed)
Office Visit    Patient Name: Kara Wright Date of Encounter: 09/23/2020  PCP:  Kara Ghent, MD   [place]  Cardiologist:  Kara Rogue, MD  Advanced Practice Provider:  No care team member to display Electrophysiologist:  None  [phone]   Chief Complaint    Chief Complaint  Patient presents with  . Annual Exam    75 year old female with history of paroxysmal atrial fibrillation (2013), previous history of COVID-19 infection, obesity, hypothyroidism, prior tobacco use (1988), prior TIA, prior CVA, and seen today for 1 year follow-up of paroxysmal atrial fibrillation.  Past Medical History    Past Medical History:  Diagnosis Date  . Atrial fib/flutter, transient    1 episode, resolved, never on coumadin  . Cataract 2019   left eye  . Clotting disorder (Kara Wright)    on xarelto  . Diverticulosis 03/1997  . Dysrhythmia    atrial fibrilation  . GERD (gastroesophageal reflux disease) 1997   good on prilosec  . Hepatitis    hx of hep B (late 1970s)  . Hyperlipidemia 08/1996  . Hypothyroidism 1984  . Migraine    w/o aura  . Osteopenia 11/2002   via dexa  . Presence of dental prosthetic device    temporary while waiting for implant  . Skin cancer    skin cancer  . Stroke Kara Wright) 08/2011   TIA   Past Surgical History:  Procedure Laterality Date  . ABDOMINAL HYSTERECTOMY    . ANAL FISSURE REPAIR    . CATARACT EXTRACTION W/PHACO Right 02/06/2018   Procedure: CATARACT EXTRACTION PHACO AND INTRAOCULAR LENS PLACEMENT (Kara Wright) RIGHT;  Surgeon: Kara Koyanagi, MD;  Location: [facility];  Service: Ophthalmology;  Laterality: Right;  . CHOLECYSTECTOMY  07/2006  . 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 nasolabial fold skin flap surgery  08/02/2007   [facility]  . PARTIAL HYSTERECTOMY  1978-79  . POLYPECTOMY  04/06/97   colon, benign by pathology  . varicose vein ablation sclerotherapy  09/2008   Dr.  Hulda Wright    Allergies  Allergies  Allergen Reactions  . Celebrex [Celecoxib]     REACTION: nausea: GI UPSET  . Doxycycline Other (See Comments)    GI upset- not an allergy.    . Gold-Containing Drug Products   . Nickel     Skin rash, allergy  . Other     Fragrance, phenylenediamine, contrast metal agents, gold  . Sodium Metabisulfite Other (See Comments)    Positive patch test  . Zocor [Simvastatin]     Aches but tolerates crestor    History of Present Illness    Kara Wright is a 75 y.o. female with PMH as above.  She has history of paroxysmal atrial fibrillation, hypothyroidism, previous tobacco use, TIA, CVA, and is here today for 1 year follow-up.  She reportedly had COVID-19 08/2018.  She reports atrial fibrillation was exacerbated during recovery.  08/2018 echo showed normal LVEF.  She was last seen in office 09/01/2019, at which time she reported continued shortness of breath.  She had her COVID-19 vaccine with atrial fibrillation after that time.  When in atrial fibrillation, she felt indigestion and palpitations.  Usually, episodes lasted for 10 to 15 minutes.  Her longest episode was reported at 1 to 2 hours.  EKG at the time was NSR.  Today, 09/23/20, she RTC and notes that she is doing well from a cardiac  standpoint.  She had a fall a couple of weeks ago, and mainly reports symptoms associated with the fall.  She reportedly tripped over a parking lot space concrete bumper and fell on the concrete.  She did not hit her head.  She did not lose consciousness.  She reports injuring her ribs and right arm.  She was badly bruised and in pain.  She reports musculoskeletal pain all over her body that is tender to palpation.  She suspects this is the reason for her borderline BP today at 132/78.  She denies any chest pain, though does note she is mildly sore on the front of her body from the fall.  She reports an episode of atrial fibrillation approximately 1 month ago with  nausea/indigestion and palpitations.  She took 2 pills of diltiazem with alleviation of symptoms.  This episode occurred while lying down in bed.  She denies any recurrence of atrial fibrillation since that time.  She does have a diagnosis of sleep apnea and uses her CPAP.  She is hopeful to start working out soon and once recovered from this fall.  On review of labs, elevated TSH discovered with patient report that she has already spoken with her PCP regarding this elevation.  No signs or symptoms of bleeding.  She reports medication compliance.  She reports BP well controlled at home.  She is trying to eat a healthy diet.  She is hopeful that she will be able to get another x-ray of her right arm, as she has noted it is swelling more than that of her left.  She denies any shortness of breath, though does note it is difficult to breathe deeply due to her rib pain.  No other reported signs or symptoms of volume overload.  Home Medications    Current Outpatient Medications on File Prior to Visit  Medication Sig Dispense Refill  . albuterol (VENTOLIN HFA) 108 (90 Base) MCG/ACT inhaler Inhale 2 puffs into the lungs every 6 (six) hours as needed for wheezing or shortness of breath. 8 g 3  . Cholecalciferol (VITAMIN D3) 50 MCG (2000 UT) capsule TAKE TWO CAPSULES BY MOUTH DAILY 180 capsule 3  . diltiazem (CARDIZEM) 30 MG tablet TAKE ONE TABLET BY MOUTH THREE TIMES DAILY AS NEEDED 90 tablet 1  . diphenhydrAMINE (BENADRYL) 25 MG tablet Take 25 mg by mouth every 8 (eight) hours as needed for itching.    . folic acid (FOLVITE) 1 MG tablet Take 1 mg by mouth daily.    Marland Kitchen ibuprofen (ADVIL) 600 MG tablet Take 600 mg by mouth as needed.    Marland Kitchen levothyroxine (SYNTHROID) 150 MCG tablet 1 pill a day except for 2 pills on Sundays. 110 tablet 3  . lidocaine (LIDODERM) 5 % Place onto the skin.    Marland Kitchen loperamide (IMODIUM A-D) 2 MG tablet Take 2 mg by mouth 2 (two) times daily as needed for diarrhea or loose stools.    .  methocarbamol (ROBAXIN) 500 MG tablet Take 1 tablet by mouth 2 (two) times daily as needed.    . methotrexate 2.5 MG tablet TAKE 5 TABLETS BY MOUTH ONCE A WEEK - TAKE FOLIC ACID ALL OTHER DAYS OF THE WEEK    . omeprazole (PRILOSEC) 20 MG capsule TAKE ONE CAPSULE BY MOUTH DAILY 90 capsule 3  . promethazine (PHENERGAN) 25 MG tablet TAKE ONE TABLET EVERY 6 HOURS AS NEEDED FOR NAUSEA 30 tablet 3  . propranolol ER (INDERAL LA) 120 MG 24 hr capsule TAKE ONE  CAPSULE BY MOUTH AT BEDTIME 90 capsule 3  . rosuvastatin (CRESTOR) 5 MG tablet Take 1 tablet (5 mg total) by mouth at bedtime. 90 tablet 3  . traMADol (ULTRAM) 50 MG tablet Take 1-2 tablets (50-100 mg total) by mouth every 12 (twelve) hours as needed. CAUTION SEDATION 50 tablet 1  . XARELTO 20 MG TABS tablet TAKE ONE TABLET BY MOUTH IN THE EVENING WITH SUPPER 90 tablet 3   No current facility-administered medications on file prior to visit.    Review of Systems    She denies dyspnea, pnd, orthopnea, n, v, dizziness, syncope, edema, weight gain, or early satiety.  She reports a recent fall with various musculoskeletal pain and bruising/ecchymosis.  She reports pain with deeper breathing due to injury to her ribs.  She reports right upper extremity swelling and pain with hope that she can get an x-ray of her arm today.  She has chest tenderness / anterior body pain that is TTP due to fall. During fall, she did not hit her head or lose consciousness.  She reports one episode of tachypalpitations and indigestion that lasted 40 minutes and was consistent with previous episodes of atrial fibrillation but no recurrence since that time.   All other systems reviewed and are otherwise negative except as noted above.  Physical Exam    VS:  BP 132/78   Pulse (!) 58   Ht 5\' 3"  (1.6 m)   Wt 224 lb (101.6 kg)   BMI 39.68 kg/m  , BMI Body mass index is 39.68 kg/m. GEN: Well nourished, well developed, in no acute distress. HEENT: normal. Neck: Supple, JVD  difficult to assess due to body habitus. No carotid bruits, or masses. Cardiac: bradycardic but regular, no murmurs, rubs, or gallops. No clubbing, cyanosis, edema.  Radials/DP/PT 2+ and equal bilaterally.  Respiratory:  Respirations regular and unlabored (though deeper breathing painful due to rib injury), distant breath sounds and otherwise clear to auscultation bilaterally. GI: Soft, nontender, nondistended, BS + x 4. MS: no deformity or atrophy. RUE >LUE due to swelling associated with fall. Skin: warm and dry, no rash. Neuro:  Strength and sensation are intact. Psych: Normal affect.  Accessory Clinical Findings    ECG personally reviewed by me today - sinus bradycardia, 58 bpm, rightward axis, stable T wave inversion in aVR and V1- no acute changes.  VITALS Reviewed today   Temp Readings from Last 3 Encounters:  09/16/20 97.6 F (36.4 C) (Temporal)  09/04/20 99.4 F (37.4 C)  08/09/20 97.8 F (36.6 C) (Oral)   BP Readings from Last 3 Encounters:  09/23/20 132/78  09/16/20 132/80  08/09/20 (!) 145/110   Pulse Readings from Last 3 Encounters:  09/23/20 (!) 58  09/16/20 70  08/09/20 75    Wt Readings from Last 3 Encounters:  09/23/20 224 lb (101.6 kg)  09/16/20 222 lb (100.7 kg)  08/09/20 226 lb (102.5 kg)     LABS  reviewed today   Lab Results  Component Value Date   WBC 7.7 08/09/2020   HGB 13.9 08/09/2020   HCT 43.4 08/09/2020   MCV 79.3 (L) 08/09/2020   PLT 210 08/09/2020   Lab Results  Component Value Date   CREATININE 0.62 09/13/2020   BUN 8 09/13/2020   NA 140 09/13/2020   K 4.0 09/13/2020   CL 103 09/13/2020   CO2 31 09/13/2020   Lab Results  Component Value Date   ALT 18 08/09/2020   AST 23 08/09/2020   ALKPHOS  82 08/09/2020   BILITOT 1.0 08/09/2020   Lab Results  Component Value Date   CHOL 110 09/13/2020   HDL 34.70 (L) 09/13/2020   LDLCALC 45 09/13/2020   LDLDIRECT 94.9 06/15/2011   TRIG 154.0 (H) 09/13/2020   CHOLHDL 3 09/13/2020     Lab Results  Component Value Date   HGBA1C 5.6 09/13/2020   Lab Results  Component Value Date   TSH 5.50 (H) 09/13/2020     STUDIES/PROCEDURES reviewed today   Echocardiogram 08/16/2018 1. The left ventricle has normal systolic function, with an ejection  fraction of 55-60%. The cavity size was normal. Left ventricular diastolic  parameters were normal.  2. The right ventricle has normal systolic function. The cavity was  normal. There is no increase in right ventricular wall thickness.  3. Left atrial size was moderately dilated.  4. Right atrial size was mildly dilated.  5. The mitral valve is degenerative. Moderate thickening of the mitral  valve leaflet. Mild calcification of the mitral valve leaflet.  6. The tricuspid valve is normal in structure.  7. The aortic valve is tricuspid Moderate thickening of the aortic valve  Moderate sclerosis of the aortic valve.   Assessment & Plan    Paroxysmal atrial fibrillation --Maintaining NSR.  Reports 1 episode of palpitations and indigestion that lasted 40 minutes and without recurrence.  She does not feel as if she is having increasing duration or frequency of episodes.  Continue current propanolol, as needed diltiazem and Xarelto 20 mg daily.  Most recent electrolytes at goal with renal function stable and H&H stable.  Essential hypertension, goal BP 130/80 --BP today borderline.  Discussed this with patient stating her BP is elevated due to pain from recent fall.  She is agreeable to monitor her BP at home.  Call the office if BP is consistently over 130/80 with patient understanding.  Continue current medications, including propranolol and diltiazem.  HLD, LDL goal below 70 --Previous LDL 45 and at goal.  Continue current Crestor 5 mg daily.  BMI 35.0-40.0 --Discussed lifestyle changes, including diet and exercise.  She is hopeful to start an exercise routine in the near future.   Hypothyroidism --TSH 5.50 on  09/13/2020.  This has already been addressed by PCP.  Continue adjusted dose of Synthroid.  Further treatment per PCP.  Fall, subsequent encounter --She reports a fall approximately 2 weeks ago.  She has been to the emergency department for this fall already.  She did not hit her head.  She did not lose consciousness.  No signs or symptoms consistent with bleeding from her blood thinner.  She does report ongoing pain and ecchymosis.  She has right upper extremity swelling, and she intends to reach out to get a repeat x-ray of her right arm.  Disposition: RTC 1 year   Arvil Chaco, PA-C 09/23/2020

## 2020-09-23 NOTE — Telephone Encounter (Signed)
Please let her know that I talked with Dr. Earleen Newport with IR.  Reasonable to proceed with lymph node biopsy.  Would need to hold xarelto the day of the procedure and 2 days prior.  IR will contact patient about procedure and scheduling. Thanks.   I appreciate the help of all involved.

## 2020-09-24 ENCOUNTER — Other Ambulatory Visit: Payer: Self-pay | Admitting: Radiology

## 2020-09-24 NOTE — Telephone Encounter (Signed)
Notified patient about still have the lymph node biopsy done; patient agrees to do so. Advised to hold the xarelto 2 days prior to the procedure as well as the day of. Patient verbalized understanding.   Patient also would like to have an xray done of her right arm. States it is still swollen; also stated that Dr. Damita Dunnings is aware of this.

## 2020-09-24 NOTE — Telephone Encounter (Signed)
This note should count.  It should be okay to hold xarelto on 4/12 through 4/14 (day of procedure and 2 days prior). Then resume med on 10/01/2020.  Thanks.

## 2020-09-24 NOTE — Telephone Encounter (Signed)
See other phone note from Dr Joanne Gavel, CMA, already called and left message for the patient. "Please let her know that I talked with Dr. Earleen Newport with IR.  Reasonable to proceed with lymph node biopsy.  Would need to hold xarelto the day of the procedure and 2 days prior.  IR will contact patient about procedure and scheduling. Thanks.    I appreciate the help of all involved.

## 2020-09-24 NOTE — Telephone Encounter (Signed)
Patient advised.

## 2020-09-24 NOTE — Telephone Encounter (Signed)
Left message for patient to call me back to let her know her appointment is scheduled for biopsy on 09/30/20 to arrive at [facility], go in through the [place] entrance. Arrive at 8:30 am and the appointment is at 9:30 am. Nurse, Olegario Shearer, will call patient about 2 days prior to go over information for the procedure. Patient will have to be fasting that morning, she will need a driver, she will be partially sedated for the procedure but not all the way.

## 2020-09-24 NOTE — Telephone Encounter (Signed)
Dr Damita Dunnings, I spoke with Marcie Bal at IR and patient is scheduled for 09/30/20 to arrive at 8:30 am. I will let patient know (see other phone note).  Dr. Laurine Blazer states they need a letter in patients chart stating that you are ok with patient holding Xarelto one day prior and to restart Xarelto 24 hours later. They need that for liability. Let me know if you have any questions for me on this. Thank you.

## 2020-09-27 NOTE — Progress Notes (Signed)
Patient on schedule for Retroperitoneal biopsy 4/14, unable to contact patient however spoke with husband who refused to take pre procedure instructions over phone. Gave our number out to husband to encourage patient to call us for instructions.

## 2020-09-27 NOTE — Telephone Encounter (Signed)
Spoke with Marcie Bal at [facility] and she reviewed this note and stated that this will suffice.

## 2020-09-28 ENCOUNTER — Ambulatory Visit (INDEPENDENT_AMBULATORY_CARE_PROVIDER_SITE_OTHER)
Admission: RE | Admit: 2020-09-28 | Discharge: 2020-09-28 | Disposition: A | Discharge status: Home / Self Care | Payer: PPO | Source: Ambulatory Visit | Attending: Family Medicine | Admitting: Family Medicine

## 2020-09-28 ENCOUNTER — Telehealth: Payer: Self-pay

## 2020-09-28 ENCOUNTER — Ambulatory Visit (INDEPENDENT_AMBULATORY_CARE_PROVIDER_SITE_OTHER): Payer: PPO | Admitting: Family Medicine

## 2020-09-28 ENCOUNTER — Other Ambulatory Visit: Payer: Self-pay

## 2020-09-28 ENCOUNTER — Encounter: Payer: Self-pay | Admitting: Family Medicine

## 2020-09-28 VITALS — BP 122/82 | HR 78 | Temp 97.5°F | Ht 63.0 in | Wt 213.0 lb

## 2020-09-28 DIAGNOSIS — R591 Generalized enlarged lymph nodes: Secondary | ICD-10-CM | POA: Diagnosis not present

## 2020-09-28 DIAGNOSIS — M79631 Pain in right forearm: Secondary | ICD-10-CM

## 2020-09-28 DIAGNOSIS — Z9889 Other specified postprocedural states: Secondary | ICD-10-CM | POA: Diagnosis not present

## 2020-09-28 DIAGNOSIS — M19031 Primary osteoarthritis, right wrist: Secondary | ICD-10-CM | POA: Diagnosis not present

## 2020-09-28 DIAGNOSIS — M25531 Pain in right wrist: Secondary | ICD-10-CM

## 2020-09-28 DIAGNOSIS — H9190 Unspecified hearing loss, unspecified ear: Secondary | ICD-10-CM | POA: Insufficient documentation

## 2020-09-28 MED ORDER — PROMETHAZINE-DM 6.25-15 MG/5ML PO SYRP: 5.0000 mL | ORAL_SOLUTION | Freq: Four times a day (QID) | ORAL | 0 refills | Status: DC | PRN

## 2020-09-28 NOTE — Telephone Encounter (Signed)
LMTCB

## 2020-09-28 NOTE — Progress Notes (Signed)
This visit occurred during the SARS-CoV-2 public health emergency.  Safety protocols were in place, including screening questions prior to the visit, additional usage of staff PPE, and extensive cleaning of exam room while observing appropriate contact time as indicated for disinfecting solutions.  She has biopsy pending and will hold xarelto starting tonight.  D/w pt.    Right arm pain.  Pain along radius, with pronation or supination.  Less pain with flex and extension, some pain along R 1st MC on palm side with wrist extension.  Normal ROM R elbow.  Not getting worse but not getting better.  No bruising, no redness.    She had fallen prev, still with R sided anterior lower rib pain.    Meds, vitals, and allergies reviewed.   ROS: Per HPI unless specifically indicated in ROS section   Xray 09/12/20 IMPRESSION:  No acute fracture or other acute osseous abnormality of the right forearm.   Unremarkable appearance of plate and screw fixation of distal radius fracture.   CT chest 09/19/20 BONES: Multilevel degenerative changes throughout the spine. No suspicious osseous lesion. Minimally displaced fracture of the right anterolateral seventh rib (5:341). Likely remote fractures of the right lateral fifth and left lateral third, fourth, and fifth ribs with associated contour abnormality. GEN: nad, alert and oriented HEENT: ncat NECK: supple w/o LA CV: rrr.  PULM: ctab, no inc wob SKIN: no acute rash, no bruising Able to make a fist with right hand, neurovascularly intact distally on the hand.  Normal radial pulse.  She has pain with pronation and supination.  Pain with flexion and extension of the wrist.  The pain is along the radius without any bruising or redness locally.  Normal range of motion of the elbow. 30 minutes were devoted to patient care in this encounter (this includes time spent reviewing the patient's file/history, interviewing and examining the patient, counseling/reviewing plan  with patient).

## 2020-09-28 NOTE — Telephone Encounter (Signed)
Would use albuterol if needed for cough.  Can try mucinex with plenty of water.  If needed could use promethazine/DM cough syrup, sent.  Thanks.

## 2020-09-28 NOTE — Telephone Encounter (Signed)
Patient forgot to ask while she was here what she should take for her cough and chest congestion?

## 2020-09-28 NOTE — Patient Instructions (Addendum)
Skip xarelto tonight and tomorrow.  Okay to restart after the procedure if okay with radiology.   Use the splint and update me as needed.  Take care.  Glad to see you.

## 2020-09-28 NOTE — Addendum Note (Signed)
Addended by: Tonia Ghent on: 09/28/2020 02:03 PM   Modules accepted: Orders

## 2020-09-29 ENCOUNTER — Other Ambulatory Visit: Payer: Self-pay | Admitting: Radiology

## 2020-09-29 DIAGNOSIS — R591 Generalized enlarged lymph nodes: Secondary | ICD-10-CM | POA: Insufficient documentation

## 2020-09-29 DIAGNOSIS — M25531 Pain in right wrist: Secondary | ICD-10-CM | POA: Insufficient documentation

## 2020-09-29 NOTE — Assessment & Plan Note (Signed)
Today's xrays reviewed and d/w pt.  She felt better in splint.  She'll use tramadol for pain and update me as needed.  No fracture seen.  She agrees with plan.  Okay for outpatient follow-up.

## 2020-09-29 NOTE — Telephone Encounter (Signed)
Spoke with patient and relayed Dr. Josefine Class message. Patient verbalized understanding.

## 2020-09-29 NOTE — Telephone Encounter (Signed)
[place] Night - Client Nonclinical Telephone Record  [facility] Client [place] Night - Client Client Site [facility] Physician Renford Dills - MD Contact Type Call Who Is Calling Patient / Member / Family / Caregiver Caller Name Sherra Kimmons Caller Phone Number [phone] Call Type Message Only Information Provided Reason for Call Returning a Call from the Office Initial [place] states she is returning a call from [place]. Additional Comment Office hours provided. Disp. Time Disposition Final User 09/28/2020 5:09:49 PM General Information Provided Yes Mikki Santee Call Closed By: Mikki Santee Transaction Date/Time: 09/28/2020 5:07:29 PM (ET)

## 2020-09-29 NOTE — Assessment & Plan Note (Signed)
She has biopsy pending and will hold xarelto starting tonight.  D/w pt.

## 2020-09-30 ENCOUNTER — Other Ambulatory Visit: Payer: Self-pay

## 2020-09-30 ENCOUNTER — Ambulatory Visit
Admission: RE | Admit: 2020-09-30 | Discharge: 2020-09-30 | Disposition: A | Discharge status: Home / Self Care | Payer: PPO | Source: Ambulatory Visit | Attending: Family Medicine | Admitting: Family Medicine

## 2020-09-30 DIAGNOSIS — R590 Localized enlarged lymph nodes: Secondary | ICD-10-CM | POA: Insufficient documentation

## 2020-09-30 DIAGNOSIS — Z7901 Long term (current) use of anticoagulants: Secondary | ICD-10-CM | POA: Diagnosis not present

## 2020-09-30 DIAGNOSIS — R591 Generalized enlarged lymph nodes: Secondary | ICD-10-CM | POA: Diagnosis not present

## 2020-09-30 LAB — PROTIME-INR
INR: 1.1 (ref 0.8–1.2)
Prothrombin Time: 14.6 seconds (ref 11.4–15.2)

## 2020-09-30 LAB — CBC
HCT: 38.3 % (ref 36.0–46.0)
Hemoglobin: 12.2 g/dL (ref 12.0–15.0)
MCH: 26 pg (ref 26.0–34.0)
MCHC: 31.9 g/dL (ref 30.0–36.0)
MCV: 81.7 fL (ref 80.0–100.0)
Platelets: 172 10*3/uL (ref 150–400)
RBC: 4.69 MIL/uL (ref 3.87–5.11)
RDW: 16.3 % — ABNORMAL HIGH (ref 11.5–15.5)
WBC: 6.6 10*3/uL (ref 4.0–10.5)
nRBC: 0 % (ref 0.0–0.2)

## 2020-09-30 MED ORDER — FENTANYL CITRATE (PF) 100 MCG/2ML IJ SOLN
INTRAMUSCULAR | Status: AC | PRN
  Administered 2020-09-30: 50 ug via INTRAVENOUS

## 2020-09-30 MED ORDER — FENTANYL CITRATE (PF) 100 MCG/2ML IJ SOLN
INTRAMUSCULAR | Status: AC
  Filled 2020-09-30: qty 2

## 2020-09-30 MED ORDER — MIDAZOLAM HCL 2 MG/2ML IJ SOLN
INTRAMUSCULAR | Status: AC | PRN
  Administered 2020-09-30: 1 mg via INTRAVENOUS

## 2020-09-30 MED ORDER — SODIUM CHLORIDE 0.9 % IV SOLN
INTRAVENOUS | Status: DC
Start: 2020-09-30 — End: 2020-10-01

## 2020-09-30 MED ORDER — MIDAZOLAM HCL 2 MG/2ML IJ SOLN
INTRAMUSCULAR | Status: AC
  Filled 2020-09-30: qty 2

## 2020-09-30 NOTE — Consult Note (Signed)
Chief Complaint: Retroperitoneal lymph adenopathy  Referring Physician(s): Duncan,Graham S  Patient Status: [facility] - Out-pt  History of Present Illness: Kara Wright is a 75 y.o. female with retroperitoneal lymphadenopathy, presents to IR for CT guided biopsy.  She feels generally well today.  She denies shortness of breath, chest pain, or abdominal pain.  She does have a mild cough.  Past Medical History:  Diagnosis Date  . Atrial fib/flutter, transient    1 episode, resolved, never on coumadin  . Cataract 2019   left eye  . Clotting disorder ([place])    on xarelto  . Diverticulosis 03/1997  . Dysrhythmia    atrial fibrilation  . GERD (gastroesophageal reflux disease) 1997   good on prilosec  . Hepatitis    hx of hep B (late 1970s)  . Hyperlipidemia 08/1996  . Hypothyroidism 1984  . Migraine    w/o aura  . Osteopenia 11/2002   via dexa  . Presence of dental prosthetic device    temporary while waiting for implant  . Skin cancer    skin cancer  . Stroke [facility]) 08/2011   TIA    Past Surgical History:  Procedure Laterality Date  . ABDOMINAL HYSTERECTOMY    . ANAL FISSURE REPAIR    . CATARACT EXTRACTION W/PHACO Right 02/06/2018   Procedure: CATARACT EXTRACTION PHACO AND INTRAOCULAR LENS PLACEMENT ([place]) RIGHT;  Surgeon: Leandrew Koyanagi, MD;  Location: [place];  Service: Ophthalmology;  Laterality: Right;  . CHOLECYSTECTOMY  07/2006  . 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 nasolabial fold skin flap surgery  08/02/2007   [facility]  . PARTIAL HYSTERECTOMY  1978-79  . POLYPECTOMY  04/06/97   colon, benign by pathology  . varicose vein ablation sclerotherapy  09/2008   Dr. Hulda Humphrey    Allergies: Celebrex [celecoxib], Doxycycline, Gold-containing drug products, Nickel, Other, Sodium metabisulfite, and Zocor [simvastatin]  Medications: Prior to Admission medications   Medication Sig Start Date End Date Taking? Authorizing  Provider  albuterol (VENTOLIN HFA) 108 (90 Base) MCG/ACT inhaler Inhale 2 puffs into the lungs every 6 (six) hours as needed for wheezing or shortness of breath. 12/18/18  Yes Icard, Octavio Graves, DO  Cholecalciferol (VITAMIN D3) 50 MCG (2000 UT) capsule TAKE TWO CAPSULES BY MOUTH DAILY 09/07/20  Yes Tonia Ghent, MD  diltiazem (CARDIZEM) 30 MG tablet TAKE ONE TABLET BY MOUTH THREE TIMES DAILY AS NEEDED 08/05/20  Yes Tonia Ghent, MD  folic acid (FOLVITE) 1 MG tablet Take 1 mg by mouth daily. 06/07/20  Yes [provider]  ibuprofen (ADVIL) 600 MG tablet Take 600 mg by mouth as needed. 12/25/19  Yes [provider]  levothyroxine (SYNTHROID) 150 MCG tablet 1 pill a day except for 2 pills on Sundays. 09/16/20  Yes Tonia Ghent, MD  loperamide (IMODIUM A-D) 2 MG tablet Take 2 mg by mouth 2 (two) times daily as needed for diarrhea or loose stools.   Yes [provider]  methotrexate 2.5 MG tablet TAKE 5 TABLETS BY MOUTH ONCE A WEEK - TAKE FOLIC ACID ALL OTHER DAYS OF THE WEEK 07/16/20  Yes [provider]  omeprazole (PRILOSEC) 20 MG capsule TAKE ONE CAPSULE BY MOUTH DAILY 09/07/20  Yes Tonia Ghent, MD  promethazine (PHENERGAN) 25 MG tablet TAKE ONE TABLET EVERY 6 HOURS AS NEEDED FOR NAUSEA 08/11/20  Yes Tonia Ghent, MD  promethazine-dextromethorphan (PROMETHAZINE-DM) 6.25-15 MG/5ML syrup Take 5 mLs by mouth  4 (four) times daily as needed for cough (sedation caution). 09/28/20  Yes Tonia Ghent, MD  propranolol ER (INDERAL LA) 120 MG 24 hr capsule TAKE ONE CAPSULE BY MOUTH AT BEDTIME 09/07/20  Yes Tonia Ghent, MD  rosuvastatin (CRESTOR) 5 MG tablet Take 1 tablet (5 mg total) by mouth at bedtime. 09/16/20  Yes Tonia Ghent, MD  traMADol (ULTRAM) 50 MG tablet Take 1-2 tablets (50-100 mg total) by mouth every 12 (twelve) hours as needed. CAUTION SEDATION 09/16/20  Yes Tonia Ghent, MD  diphenhydrAMINE (BENADRYL) 25 MG tablet Take 25 mg by mouth every  8 (eight) hours as needed for itching. Patient not taking: Reported on 09/30/2020    [provider]  methocarbamol (ROBAXIN) 500 MG tablet Take 1 tablet by mouth 2 (two) times daily as needed. Patient not taking: Reported on 09/30/2020 09/21/20   [provider]  XARELTO 20 MG TABS tablet TAKE ONE TABLET BY MOUTH IN THE EVENING WITH SUPPER 09/07/20   Tonia Ghent, MD     Family History  Problem Relation Age of Onset  . Hypertension Mother   . Osteopenia Mother   . Stroke Mother        deceased age 33  . Colon cancer Father 60       mets  . Allergies Sister   . Breast cancer Cousin        pat cousin  . Rectal cancer Neg Hx     Social History   Socioeconomic History  . Marital status: Married    Spouse name: Not on file  . Number of children: 2  . Years of education: Not on file  . Highest education level: Not on file  Occupational History  . Occupation: Ecologist: RETIRED    Comment: [employer]  . Occupation: Barrister's clerk: [employer]  Tobacco Use  . Smoking status: Former Smoker    Packs/day: 1.00    Quit date: 06/26/1986    Years since quitting: 34.2  . Smokeless tobacco: Never Used  Vaping Use  . Vaping Use: Never used  Substance and Sexual Activity  . Alcohol use: No  . Drug use: No  . Sexual activity: Not Currently  Other Topics Concern  . Not on file  Social History Narrative   Worked part time at 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     Married 1979   2 daughters from prev relationship   [employer] fan, [employer] fan.   Social Determinants of Health   Financial Resource Strain: Not on file  Food Insecurity: Not on file  Transportation Needs: Not on file  Physical Activity: Not on file  Stress: Not on file  Social Connections: Not on file   Review of Systems: A 12 point ROS discussed and pertinent positives are indicated in the HPI above.  All other systems are  negative.  Review of Systems  Vital Signs: BP 139/70   Pulse 61   Temp 98.3 F (36.8 C)   Resp 19   Ht 5\' 3"  (1.6 m)   Wt 96.6 kg   SpO2 96%   BMI 37.73 kg/m   Physical Exam Constitutional:      Appearance: Normal appearance.  HENT:     Mouth/Throat:     Pharynx: Oropharynx is clear.  Cardiovascular:     Rate and Rhythm: Normal rate and regular rhythm.     Heart  sounds: Normal heart sounds.  Pulmonary:     Effort: Pulmonary effort is normal.     Breath sounds: Normal breath sounds.  Abdominal:     General: Bowel sounds are normal.     Palpations: Abdomen is soft.  Skin:    General: Skin is warm and dry.  Neurological:     Mental Status: She is alert and oriented to person, place, and time.  Psychiatric:        Mood and Affect: Mood normal.     Imaging: DG Forearm Right  Result Date: 09/28/2020 CLINICAL DATA:  Wrist forearm pain.  Fall 1 month ago. EXAM: RIGHT FOREARM - 2 VIEW COMPARISON:  None. FINDINGS: Plate and screw fixation seen in the distal right radius. No visible acute fracture, subluxation or dislocation. No hardware complicating feature. IMPRESSION: Postoperative changes in the distal right radius. No acute bony abnormality. Electronically Signed   By: Rolm Baptise M.D.   On: 09/28/2020 08:48   DG Wrist Complete Right  Result Date: 09/28/2020 CLINICAL DATA:  Right wrist and forearm pain.  History of fall. EXAM: RIGHT WRIST - COMPLETE 3+ VIEW COMPARISON:  Right forearm 09/28/2020 FINDINGS: Surgical plate and screws along the volar aspect of the distal radius. Surgical hardware is intact. Negative for acute fracture or dislocation in the right wrist. Sclerosis and degenerative changes at the STT joint. IMPRESSION: 1. No acute bone abnormality to the right wrist. Postsurgical changes as described. 2. Degenerative changes involving the STT joint. Electronically Signed   By: Markus Daft M.D.   On: 09/28/2020 08:51   CT OUTSIDE FILMS BODY/ABD/PELVIS  Result  Date: 09/23/2020 This examination belongs to an outside facility and is stored here for comparison purposes only.  Contact the originating outside institution for any associated report or interpretation.  CT OUTSIDE FILMS BODY/ABD/PELVIS  Result Date: 09/23/2020 This examination belongs to an outside facility and is stored here for comparison purposes only.  Contact the originating outside institution for any associated report or interpretation.   Labs:  CBC: Recent Labs    08/09/20 1723 09/30/20 0923  WBC 7.7 6.6  HGB 13.9 12.2  HCT 43.4 38.3  PLT 210 172    COAGS: Recent Labs    09/30/20 0923  INR 1.1    BMP: Recent Labs    08/09/20 1723 09/13/20 0945  NA 135 140  K 3.7 4.0  CL 97* 103  CO2 28 31  GLUCOSE 106* 113*  BUN 22 8  CALCIUM 9.2 9.2  CREATININE 1.10* 0.62  GFRNONAA 53*  --     LIVER FUNCTION TESTS: Recent Labs    08/09/20 1723  BILITOT 1.0  AST 23  ALT 18  ALKPHOS 82  PROT 7.5  ALBUMIN 4.1    TUMOR MARKERS: No results for input(s): AFPTM, CEA, CA199, CHROMGRNA in the last 8760 hours.  Assessment and Plan:  76 year old woman with history of retroperitoneal lymphadenopathy presents to IR for CT guided biopsy.  Risks and benefits of retroperitoneal biopsy was discussed with the patient and/or patient's family including, but not limited to bleeding, infection, damage to adjacent structures or low yield requiring additional tests.  All of the questions were answered and there is agreement to proceed.  Consent signed and in chart.  Thank you for this interesting consult.  I greatly enjoyed meeting Shamaya Kauer and look forward to participating in their care.  A copy of this report was sent to the requesting provider on this date.  Electronically  Signed: Paula Libra Jairus Tonne, MD 09/30/2020, 10:17 AM   I spent a total of  30 Minutes  in face to face in clinical consultation, greater than 50% of which was counseling/coordinating care for  retroperitoneal lymph node biopsy.

## 2020-09-30 NOTE — Discharge Instructions (Signed)
Moderate Conscious Sedation, Adult, Care After This sheet gives you information about how to care for yourself after your procedure. Your health care provider may also give you more specific instructions. If you have problems or questions, contact your health care provider. What can I expect after the procedure? After the procedure, it is common to have:  Sleepiness for several hours.  Impaired judgment for several hours.  Difficulty with balance.  Vomiting if you eat too soon. Follow these instructions at home: For the time period you were told by your health care provider:  Rest.  Do not participate in activities where you could fall or become injured.  Do not drive or use machinery.  Do not drink alcohol.  Do not take sleeping pills or medicines that cause drowsiness.  Do not make important decisions or sign legal documents.  Do not take care of children on your own.      Eating and drinking  Follow the diet recommended by your health care provider.  Drink enough fluid to keep your urine pale yellow.  If you vomit: ? Drink water, juice, or soup when you can drink without vomiting. ? Make sure you have little or no nausea before eating solid foods.   General instructions  Take over-the-counter and prescription medicines only as told by your health care provider.  Have a responsible adult stay with you for the time you are told. It is important to have someone help care for you until you are awake and alert.  Do not smoke.  Keep all follow-up visits as told by your health care provider. This is important. Contact a health care provider if:  You are still sleepy or having trouble with balance after 24 hours.  You feel light-headed.  You keep feeling nauseous or you keep vomiting.  You develop a rash.  You have a fever.  You have redness or swelling around the IV site. Get help right away if:  You have trouble breathing.  You have new-onset confusion at  home. Summary  After the procedure, it is common to feel sleepy, have impaired judgment, or feel nauseous if you eat too soon.  Rest after you get home. Know the things you should not do after the procedure.  Follow the diet recommended by your health care provider and drink enough fluid to keep your urine pale yellow.  Get help right away if you have trouble breathing or new-onset confusion at home. This information is not intended to replace advice given to you by your health care provider. Make sure you discuss any questions you have with your health care provider. Document Revised: 10/03/2019 Document Reviewed: 05/01/2019 Elsevier Patient Education  2021 [place]. Open Lymph Node Biopsy, Care After The following information offers guidance on how to care for yourself after your procedure. Your health care provider may also give you more specific instructions. If you have problems or questions, contact your health care provider. What can I expect after the procedure? After the procedure, it is common to have:  Bruising.  Soreness.  Mild swelling. Follow these instructions at home: Medicines  Take over-the-counter and prescription medicines only as told by your health care provider.  If you were prescribed an antibiotic medicine, take or use it as told by your health care provider. Do not stop taking the antibiotic even if you start to feel better.  Do not drive or use heavy machinery while taking prescription pain medicine. Incision care  Follow instructions from your health  care provider about how to take care of your incision. Make sure you: ? Wash your hands with soap and water for at least 20 seconds before and after you change your bandage (dressing). If soap and water are not available, use hand sanitizer. ? Change your dressing as told by your health care provider. ? Leave stitches (sutures), skin glue, or adhesive strips in place. These skin closures may need to  stay in place for 2 weeks or longer. If adhesive strip edges start to loosen and curl up, you may trim the loose edges. Do not remove adhesive strips completely unless your health care provider tells you to do that.  Check your incision area every day for signs of infection. Check for: ? More redness, swelling, or pain. ? Fluid or blood. ? Warmth. ? Pus or a bad smell.   General instructions  Do not take baths, swim, or use a hot tub until your health care provider approves. Ask your health care provider if you may take showers. You may only be allowed to take sponge baths.  If you were given a sedative during the procedure, it can affect you for several hours. Do not drive or operate machinery until your health care provider says that it is safe.  Return to your normal activities as told by your health care provider. Ask your health care provider what activities are safe for you.  Keep all follow-up visits. This is important. Contact a health care provider if:  You have a fever.  You have increased redness, swelling, or pain around your incision.  You have fluid or blood coming from your incision.  Your incision feels warm to the touch.  You have pus or a bad smell coming from your incision.  You have pain or numbness that gets worse or lasts longer than a few days. Summary  After a lymph node biopsy, it is common to have bruising, soreness, and mild swelling.  Follow your health care provider's instructions about taking care of yourself at home. You will be told how to take medicines, take care of your incision, and check for infection.  Return to your normal activities as told by your health care provider. Ask your health care provider what activities are safe for you.  Contact a health care provider if you have increased redness, swelling, or pain around your incision, you have a fever, or you have worsening pain or numbness. This information is not intended to replace  advice given to you by your health care provider. Make sure you discuss any questions you have with your health care provider. Document Revised: 03/18/2020 Document Reviewed: 03/18/2020 Elsevier Patient Education  [place].

## 2020-09-30 NOTE — Progress Notes (Signed)
Patient clinically stable post LN biopsy per DR Mir, tolerated well with vitals stable pre and post procedure. Denies complaints at this time. Received Versed 1 mg along with Fentanyl 50 mcg IV for procedure. Report given to [facility] in specials post procedure. Awake/alert and oriented post procedure.

## 2020-09-30 NOTE — Procedures (Signed)
Interventional Radiology Procedure Note  Procedure: Retroperitoneal lymph node biopsy  Indication: Retroperitoneal lymph adenopathy  Findings: Please refer to procedural dictation for full description.  Complications: None  EBL: < 10 mL  Miachel Roux, MD [phone]

## 2020-10-01 ENCOUNTER — Encounter: Payer: Self-pay | Admitting: Radiology

## 2020-10-06 ENCOUNTER — Telehealth: Payer: Self-pay | Admitting: Family Medicine

## 2020-10-06 LAB — SURGICAL PATHOLOGY

## 2020-10-06 NOTE — Telephone Encounter (Signed)
She had benign findings on IR biopsy.  If concern persists, it is possible to consider excisional biopsy.  I think it makes sense to use her f/u imaging as a guide to make a decision about future biopsy.  I need your input re: 6 months f/u chest imaging based on the 06/2020 CT in our system vs 09/2020 at [facility] (either July 2022 vs October 2022).  I would like your input on her situation and the possible need for f/u abdominal imaging at that time.  Thanks.

## 2020-10-11 ENCOUNTER — Ambulatory Visit: Payer: PPO | Admitting: Family Medicine

## 2020-10-11 NOTE — Telephone Encounter (Addendum)
Kara Wright,   I think with the path results: A. LYMPH NODE, LEFT RETROPERITONEAL; CT-GUIDED CORE NEEDLE BIOPSY:  - FRAGMENTS OF BENIGN LYMPH NODE WITH ASSOCIATED EPITHELIOID  NONNECROTIZING GRANULOMATOUS INFLAMMATION.    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   I do think we need to follow her imaging.  I am going to ask one of my partners to take a look at her case and images. Dr. Loanne Drilling is slowly building a sarcoid clinic. I would like her thoughts on it as well.   Thanks  [employer]

## 2020-10-12 ENCOUNTER — Telehealth: Payer: Self-pay

## 2020-10-12 NOTE — Telephone Encounter (Signed)
Spoke with patient and notified her of bx results. Patient verbalized understanding.

## 2020-10-12 NOTE — Telephone Encounter (Signed)
Pt left v/m requesting cb with biopsy results.

## 2020-10-12 NOTE — Telephone Encounter (Addendum)
I thank all involved- please let me know if you want me to order the follow up imaging.

## 2020-10-13 NOTE — Telephone Encounter (Signed)
Yes, I think we care getting her scheduled Thanks Garner Nash, DO [facility] 10/13/2020 12:48 PM

## 2020-10-13 NOTE — Telephone Encounter (Addendum)
Happy to see her in my clinic. Would recommend obtaining CT Chest in July (6 month follow-up) which is also a reasonable time to monitor lesions.

## 2020-10-13 NOTE — Telephone Encounter (Signed)
I have placed recall for 3 month appt with Dr Loanne Drilling per her request. The schedule is not out 3 months. Forwarding back to Dr. Damita Dunnings to place order for the CT she rec that pt have repeated in July 2022. Thanks!

## 2020-10-13 NOTE — Telephone Encounter (Signed)
I want to make sure the plan and communication with the patient are clear.  I put a note in the EMR about repeating her CT in July. I will order that.    Dr. Etta Quill you have your staff contact patient to follow-up with you regarding sarcoidosis diagnosis?  I appreciate the help of all involved.

## 2020-10-21 DIAGNOSIS — H26491 Other secondary cataract, right eye: Secondary | ICD-10-CM | POA: Diagnosis not present

## 2020-10-21 DIAGNOSIS — H40053 Ocular hypertension, bilateral: Secondary | ICD-10-CM | POA: Diagnosis not present

## 2020-11-23 ENCOUNTER — Other Ambulatory Visit: Payer: Self-pay | Admitting: *Deleted

## 2020-11-23 DIAGNOSIS — R911 Solitary pulmonary nodule: Secondary | ICD-10-CM

## 2020-12-01 DIAGNOSIS — G4733 Obstructive sleep apnea (adult) (pediatric): Secondary | ICD-10-CM | POA: Diagnosis not present

## 2020-12-13 ENCOUNTER — Other Ambulatory Visit: Payer: Self-pay | Admitting: Family Medicine

## 2020-12-14 DIAGNOSIS — H26491 Other secondary cataract, right eye: Secondary | ICD-10-CM | POA: Diagnosis not present

## 2020-12-21 ENCOUNTER — Telehealth: Payer: Self-pay | Admitting: Family Medicine

## 2020-12-21 NOTE — Telephone Encounter (Signed)
Called patient to see what referral to dermatology is for; [facility]

## 2020-12-21 NOTE — Telephone Encounter (Signed)
Patient returned call. She states she has a rash that is keeping her up at night. I went ahead and made an appointment with Anda Kraft as this is a new concern and has not been discussed in the past.

## 2020-12-21 NOTE — Telephone Encounter (Signed)
Noted. Thanks.

## 2020-12-21 NOTE — Telephone Encounter (Signed)
Patient call in requesting a referral for a dermatologist wanted to know if she needs a office visit first . Would like a call back

## 2020-12-22 ENCOUNTER — Other Ambulatory Visit: Payer: Self-pay

## 2020-12-22 ENCOUNTER — Ambulatory Visit (INDEPENDENT_AMBULATORY_CARE_PROVIDER_SITE_OTHER): Payer: PPO | Admitting: Primary Care

## 2020-12-22 ENCOUNTER — Encounter: Payer: Self-pay | Admitting: Primary Care

## 2020-12-22 DIAGNOSIS — L299 Pruritus, unspecified: Secondary | ICD-10-CM | POA: Diagnosis not present

## 2020-12-22 NOTE — Patient Instructions (Signed)
Stop taking Benadryl.  Start Zyrtec 10 mg once nightly and famotidine (Pepcid) 20 mg once nightly for itching.   Touch base with your dermatologist as discussed.   It was a pleasure meeting you!

## 2020-12-22 NOTE — Assessment & Plan Note (Addendum)
Persistent, worse over last several months. Has failed numerous OTC and Rx treatments.   Recommended she reach out to her dermatologist for a sooner appointment.   Will trial famotidine 20 mg once daily along with Zyrtec 10 mg once daily to see if this helps. She agrees.   She will stop Benadryl

## 2020-12-22 NOTE — Progress Notes (Signed)
Subjective:    Patient ID: Kara Wright, female    DOB: Oct 20, 1945, 75 y.o.   MRN: [id number]  HPI  Kara Wright is a very pleasant 75 y.o. female patient of Dr. Damita Dunnings with a history of CAD, TIA, hypothyroidism, itching who presents today to discuss rash.    Her rash is located to the bilateral upper and lower extremities, posterior trunk that began over one year ago, worse over the last 2 months. Her rash is itchy, tingling, has developed scabs from scratching.   She is followed by by dermatology who prescribed methotrexate and folic acid, has been taking for a few months, has a follow up visit scheduled for September. She's tried numerous OTC creams/lotions, witch hazel, Benadryl, Rx topical steroids (clobetasol/betamethasone) without improvement.   She's been to see an allergist who has completed allergy testing. Was prescribed gabapentin at the time, no longer taking as this is ineffective. She denies any changes in her lifestyle over the last 1+ year.    Review of Systems  Respiratory:  Negative for shortness of breath and wheezing.   Skin:  Positive for rash.        Past Medical History:  Diagnosis Date   Atrial fib/flutter, transient    1 episode, resolved, never on coumadin   Cataract 2019   left eye   Clotting disorder ([place])    on xarelto   Diverticulosis 03/1997   Dysrhythmia    atrial fibrilation   GERD (gastroesophageal reflux disease) 1997   good on prilosec   Hepatitis    hx of hep B (late 1970s)   Hyperlipidemia 08/1996   Hypothyroidism 1984   Migraine    w/o aura   Osteopenia 11/2002   via dexa   Presence of dental prosthetic device    temporary while waiting for implant   Skin cancer    skin cancer   Stroke ([place]) 08/2011   TIA    Social History   Socioeconomic History   Marital status: Married    Spouse name: Not on file   Number of children: 2   Years of education: Not on file   Highest education level: Not on file  Occupational  History   Occupation: Ecologist: RETIRED    Comment: 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     Married 1979   2 daughters from prev relationship   [employer] fan, [employer] fan.   Social Determinants of Health   Financial Resource Strain: Not on file  Food Insecurity: Not on file  Transportation Needs: Not on file  Physical Activity: Not on file  Stress: Not on file  Social Connections: Not on file  Intimate Partner Violence: Not on file    Past Surgical History:  Procedure Laterality Date   ABDOMINAL HYSTERECTOMY     ANAL FISSURE REPAIR  CATARACT EXTRACTION W/PHACO Right 02/06/2018   Procedure: CATARACT EXTRACTION PHACO AND INTRAOCULAR LENS PLACEMENT ([place]) RIGHT;  Surgeon: Leandrew Koyanagi, MD;  Location: [facility];  Service: Ophthalmology;  Laterality: Right;   CHOLECYSTECTOMY  07/2006   MOHS SURGERY  12/05/2006   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 Hulda Humphrey    Family History  Problem Relation Age of Onset   Hypertension Mother    Osteopenia Mother    Stroke Mother        deceased age 30   Colon cancer Father 43       mets   Allergies Sister    Breast cancer Cousin        pat cousin   Rectal cancer Neg Hx     Allergies  Allergen Reactions    Celebrex [Celecoxib]     REACTION: nausea: GI UPSET   Doxycycline Other (See Comments)    GI upset- not an allergy.     Gold-Containing Drug Products    Nickel     Skin rash, allergy   Other     Fragrance, phenylenediamine, contrast metal agents, gold   Sodium Metabisulfite Other (See Comments)    Positive patch test   Zocor [Simvastatin]     Aches but tolerates crestor    Current Outpatient Medications on File Prior to Visit  Medication Sig Dispense Refill   albuterol (VENTOLIN HFA) 108 (90 Base) MCG/ACT inhaler Inhale 2 puffs into the lungs every 6 (six) hours as needed for wheezing or shortness of breath. 8 g 3   Cholecalciferol (VITAMIN D3) 50 MCG (2000 UT) capsule TAKE TWO CAPSULES BY MOUTH DAILY 180 capsule 3   diltiazem (CARDIZEM) 30 MG tablet TAKE ONE TABLET BY MOUTH THREE TIMES DAILY AS NEEDED 90 tablet 1   diphenhydrAMINE (BENADRYL) 25 MG tablet Take 25 mg by mouth every 8 (eight) hours as needed for itching.     folic acid (FOLVITE) 1 MG tablet Take 1 mg by mouth daily.     ibuprofen (ADVIL) 600 MG tablet Take 600 mg by mouth as needed.     levothyroxine (SYNTHROID) 150 MCG tablet 1 pill a day except for 2 pills on Sundays. 110 tablet 3   loperamide (IMODIUM A-D) 2 MG tablet Take 2 mg by mouth 2 (two) times daily as needed for diarrhea or loose stools.     methotrexate 2.5 MG tablet TAKE 5 TABLETS BY MOUTH ONCE A WEEK - TAKE FOLIC ACID ALL OTHER DAYS OF THE WEEK     omeprazole (PRILOSEC) 20 MG capsule TAKE ONE CAPSULE BY MOUTH DAILY 90 capsule 3   promethazine (PHENERGAN) 25 MG tablet TAKE ONE TABLET BY MOUTH EVERY 6 HOURS AS NEEDED FOR NAUSEA 30 tablet 3   propranolol ER (INDERAL LA) 120 MG 24 hr capsule TAKE ONE CAPSULE BY MOUTH AT BEDTIME 90 capsule 3   rosuvastatin (CRESTOR) 5 MG tablet Take 1 tablet (5 mg total) by mouth at bedtime. 90 tablet 3   traMADol (ULTRAM) 50 MG tablet Take 1-2 tablets (50-100 mg total) by mouth every 12 (twelve) hours as needed. CAUTION SEDATION  50 tablet 1   XARELTO 20 MG TABS tablet TAKE ONE TABLET BY MOUTH IN THE EVENING WITH SUPPER 90 tablet 3   No current  facility-administered medications on file prior to visit.    BP (!) 144/80   Pulse 82   Temp 98.3 F (36.8 C) (Temporal)   Ht 5\' 3"  (1.6 m)   Wt 216 lb (98 kg)   SpO2 98%   BMI 38.26 kg/m  Objective:   Physical Exam Skin:    General: Skin is warm and dry.     Findings: Rash present.     Comments: Scabbing to left upper and left lower extremities, and posterior trunk with scratch marks.   Erythematous rash to right forearm at the site of the antecubital fossa extending outward.   Neurological:     Mental Status: She is alert.          Assessment & Plan:      This visit occurred during the SARS-CoV-2 public health emergency.  Safety protocols were in place, including screening questions prior to the visit, additional usage of staff PPE, and extensive cleaning of exam room while observing appropriate contact time as indicated for disinfecting solutions.

## 2021-01-07 DIAGNOSIS — L308 Other specified dermatitis: Secondary | ICD-10-CM | POA: Diagnosis not present

## 2021-01-07 DIAGNOSIS — L309 Dermatitis, unspecified: Secondary | ICD-10-CM | POA: Diagnosis not present

## 2021-01-10 ENCOUNTER — Ambulatory Visit (INDEPENDENT_AMBULATORY_CARE_PROVIDER_SITE_OTHER): Payer: PPO | Admitting: Family Medicine

## 2021-01-10 ENCOUNTER — Other Ambulatory Visit: Payer: Self-pay

## 2021-01-10 ENCOUNTER — Encounter: Payer: Self-pay | Admitting: Family Medicine

## 2021-01-10 ENCOUNTER — Ambulatory Visit
Admission: RE | Admit: 2021-01-10 | Discharge: 2021-01-10 | Disposition: A | Discharge status: Home / Self Care | Payer: PPO | Source: Ambulatory Visit | Attending: Pulmonary Disease | Admitting: Pulmonary Disease

## 2021-01-10 VITALS — BP 128/74 | HR 78 | Temp 97.9°F | Ht 63.0 in | Wt 223.9 lb

## 2021-01-10 DIAGNOSIS — R221 Localized swelling, mass and lump, neck: Secondary | ICD-10-CM | POA: Diagnosis not present

## 2021-01-10 DIAGNOSIS — R911 Solitary pulmonary nodule: Secondary | ICD-10-CM

## 2021-01-10 DIAGNOSIS — R918 Other nonspecific abnormal finding of lung field: Secondary | ICD-10-CM | POA: Diagnosis not present

## 2021-01-10 DIAGNOSIS — I700 Atherosclerosis of aorta: Secondary | ICD-10-CM | POA: Diagnosis not present

## 2021-01-10 NOTE — Patient Instructions (Signed)
This could be a lipoma (benign fatty tumor).  Let me check with radiology and we'll go from there.  Take care.  Glad to see you.

## 2021-01-10 NOTE — Progress Notes (Signed)
This visit occurred during the SARS-CoV-2 public health emergency.  Safety protocols were in place, including screening questions prior to the visit, additional usage of staff PPE, and extensive cleaning of exam room while observing appropriate contact time as indicated for disinfecting solutions.  She had biopsy done re: itching, on R forearm.  She is awaiting report from dermatology.  Discussed.  R sided neck puffiness, near the R clavicle.  Not SOB.  No dysphagia.  Is been present for months per patient report but gradually enlarging.  No FNAVD.  Some rhinorrhea last week, had chills at the time.  Resolved now.    Meds, vitals, and allergies reviewed.   ROS: Per HPI unless specifically indicated in ROS section   Nad Ncat Neck supple, no LA, but soft mass noted in the soft tissue of the lower neck on the right side, superior to the medial portion of the right clavicle.  No rash or ulceration.  No stridor.  It does not approach the midline. Rrr Ctab Biopsy site noted on the right forearm.  Skin well perfused.

## 2021-01-12 ENCOUNTER — Telehealth: Payer: Self-pay | Admitting: Family Medicine

## 2021-01-12 DIAGNOSIS — R221 Localized swelling, mass and lump, neck: Secondary | ICD-10-CM | POA: Insufficient documentation

## 2021-01-12 NOTE — Telephone Encounter (Signed)
Please check with patient.  There is no sign of a cancerous process for the mass on the right side of her neck.  It is likely not a lipoma.  I checked with radiology and we need to get an MRI to completely visualize it since we could not image the entire mass on the CT.  We can get a better look with MRI instead of CT anyway.  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  I think it makes sense to get the MRI done and go from there.  I doubt she is going to need to have anything done about this but it makes sense to get the pictures done first.  I put in the order.  Thanks.

## 2021-01-12 NOTE — Assessment & Plan Note (Signed)
We reviewed her recent CT images at office visit.  Clinically I thought she could have a lipoma.  Discussed.  I told her I wanted to check with radiology.  I did check with radiology and it was thought that the patient could have a lymphovascular malformation but there was no sign of an ominous process.  She does not have any alarming symptoms at this point other than local swelling.  It is not impinging on her airway and she is able to swallow well.  We did not get complete visualization on the CT so it makes sense to get an MRI with and without contrast of the neck to fully characterize and then go from there.  See follow-up phone note.

## 2021-01-13 NOTE — Telephone Encounter (Signed)
Patient notified of results and verbalized understanding. Patient will await call to get scheduled for MRI.

## 2021-01-22 NOTE — Progress Notes (Signed)
Leigh, you can let her know that her ct imaging is stable. Several of the nodules have disappeared. Remains a few small lymphnodes which are also stable. Would consider repeat CT Chest in 1 year.  Thanks,  BLI  CC: Dr. Damita Dunnings as Dianne Dun, DO [facility] 01/22/2021 1:09 PM

## 2021-01-24 ENCOUNTER — Encounter: Payer: Self-pay | Admitting: *Deleted

## 2021-01-24 ENCOUNTER — Telehealth: Payer: Self-pay | Admitting: Pulmonary Disease

## 2021-01-24 DIAGNOSIS — R911 Solitary pulmonary nodule: Secondary | ICD-10-CM

## 2021-01-24 NOTE — Telephone Encounter (Signed)
you can let her know that her ct imaging is stable. Several of the nodules have disappeared. Remains a few small lymphnodes which are also stable. Wouldconsider repeat CT Chest in 1 year.   Thanks,   BLI   CC: Dr. Damita Dunnings as Kara Dun, DO [facility] 01/22/2021 1:09 PM

## 2021-02-02 ENCOUNTER — Telehealth: Payer: Self-pay | Admitting: Family Medicine

## 2021-02-02 NOTE — Progress Notes (Signed)
  Chronic Care Management   Note  02/02/2021 Name: Abbiegail Hampel MRN: [id number] DOB: 04-Sep-1945  Alaijha Dile is a 75 y.o. year old female who is a primary care patient of Tonia Ghent, MD. I reached out to [name] by phone today in response to a referral sent by Ms. Dene Gentry Wolden's PCP, Tonia Ghent, MD.   Ms. Vanvranken was given information about Chronic Care Management services today including:  CCM service includes personalized support from designated clinical staff supervised by her physician, including individualized plan of care and coordination with other care providers 24/7 contact phone numbers for assistance for urgent and routine care needs. Service will only be billed when office clinical staff spend 20 minutes or more in a month to coordinate care. Only one practitioner may furnish and bill the service in a calendar month. The patient may stop CCM services at any time (effective at the end of the month) by phone call to the office staff.   Patient agreed to services and verbal consent obtained.   Follow up plan:   Tatjana Secretary/administrator

## 2021-02-10 DIAGNOSIS — Z79899 Other long term (current) drug therapy: Secondary | ICD-10-CM | POA: Diagnosis not present

## 2021-02-10 DIAGNOSIS — L309 Dermatitis, unspecified: Secondary | ICD-10-CM | POA: Diagnosis not present

## 2021-02-11 DIAGNOSIS — Z79899 Other long term (current) drug therapy: Secondary | ICD-10-CM | POA: Diagnosis not present

## 2021-02-15 ENCOUNTER — Ambulatory Visit
Admission: RE | Admit: 2021-02-15 | Discharge: 2021-02-15 | Disposition: A | Discharge status: Home / Self Care | Payer: PPO | Source: Ambulatory Visit | Attending: Family Medicine | Admitting: Family Medicine

## 2021-02-15 ENCOUNTER — Other Ambulatory Visit: Payer: Self-pay

## 2021-02-15 DIAGNOSIS — R221 Localized swelling, mass and lump, neck: Secondary | ICD-10-CM | POA: Diagnosis not present

## 2021-02-15 DIAGNOSIS — M47812 Spondylosis without myelopathy or radiculopathy, cervical region: Secondary | ICD-10-CM | POA: Diagnosis not present

## 2021-02-15 DIAGNOSIS — M50223 Other cervical disc displacement at C6-C7 level: Secondary | ICD-10-CM | POA: Diagnosis not present

## 2021-02-15 DIAGNOSIS — M4802 Spinal stenosis, cervical region: Secondary | ICD-10-CM | POA: Diagnosis not present

## 2021-02-15 MED ORDER — GADOBUTROL 1 MMOL/ML IV SOLN
10.0000 mL | Freq: Once | INTRAVENOUS | Status: AC | PRN
  Administered 2021-02-15: 10 mL via INTRAVENOUS

## 2021-02-21 ENCOUNTER — Other Ambulatory Visit: Payer: Self-pay | Admitting: Family Medicine

## 2021-02-21 DIAGNOSIS — R591 Generalized enlarged lymph nodes: Secondary | ICD-10-CM

## 2021-02-21 DIAGNOSIS — R221 Localized swelling, mass and lump, neck: Secondary | ICD-10-CM

## 2021-02-23 ENCOUNTER — Telehealth: Payer: Self-pay | Admitting: Family Medicine

## 2021-02-23 NOTE — Telephone Encounter (Signed)
Left message for call back. Per chart referral was put in to [facility] on 02/21/21 and their office will reach out to the patient to schedule, they are located in [place]. This can take a week or 2 to hear about an appointment.

## 2021-02-23 NOTE — Telephone Encounter (Signed)
Kara Wright called in checking on her biopsy referral. And wanted to know if she can get someone in [place]

## 2021-02-23 NOTE — Telephone Encounter (Signed)
Returned [employer]. Related the message

## 2021-03-04 DIAGNOSIS — S92514A Nondisplaced fracture of proximal phalanx of right lesser toe(s), initial encounter for closed fracture: Secondary | ICD-10-CM | POA: Diagnosis not present

## 2021-03-05 DIAGNOSIS — G4733 Obstructive sleep apnea (adult) (pediatric): Secondary | ICD-10-CM | POA: Diagnosis not present

## 2021-03-07 ENCOUNTER — Encounter (INDEPENDENT_AMBULATORY_CARE_PROVIDER_SITE_OTHER): Payer: Self-pay

## 2021-03-07 ENCOUNTER — Other Ambulatory Visit: Payer: Self-pay

## 2021-03-07 ENCOUNTER — Ambulatory Visit (INDEPENDENT_AMBULATORY_CARE_PROVIDER_SITE_OTHER): Payer: PPO | Admitting: Vascular Surgery

## 2021-03-07 VITALS — BP 156/81 | HR 56 | Ht 62.0 in | Wt 222.0 lb

## 2021-03-07 DIAGNOSIS — E7849 Other hyperlipidemia: Secondary | ICD-10-CM | POA: Diagnosis not present

## 2021-03-07 DIAGNOSIS — I25118 Atherosclerotic heart disease of native coronary artery with other forms of angina pectoris: Secondary | ICD-10-CM

## 2021-03-07 DIAGNOSIS — I10 Essential (primary) hypertension: Secondary | ICD-10-CM | POA: Diagnosis not present

## 2021-03-07 DIAGNOSIS — I480 Paroxysmal atrial fibrillation: Secondary | ICD-10-CM | POA: Diagnosis not present

## 2021-03-07 DIAGNOSIS — R591 Generalized enlarged lymph nodes: Secondary | ICD-10-CM

## 2021-03-09 ENCOUNTER — Encounter (INDEPENDENT_AMBULATORY_CARE_PROVIDER_SITE_OTHER): Payer: Self-pay | Admitting: Vascular Surgery

## 2021-03-09 NOTE — H&P (View-Only) (Signed)
MRN : [id number]  Kara Wright is a 75 y.o. (January 28, 1946) female who presents with chief complaint of check the lymph nodes on my neck.  History of Present Illness:   Location: Base of right neck Character/quality of the symptom: Fullness mild tenderness Severity: Mild to moderate Duration: This area has been increasing in size for several months Timing/onset: Continuous Aggravating/context: None Relieving/modifying: None  Patient developed a fullness at the base of the neck which led to an MR of the soft tissue of the neck dated February 16, 2021.  This study is reviewed by me personally and demonstrates chronic lymphadenopathy of the right lower neck with infiltrate changes in the surrounding fat pad.  Based on radiologic documentation this is been present since 2012 but has increased with time.  Of note there is no involvement in the lymphatic tissues of the right axilla.  Patient does have an existing diagnosis of chronic lymphadenopathy and has documented retroperitoneal and pulmonary nodes present.  Biopsy has been performed in the past but this was not exclusively diagnostic.  Considerations for sarcoid have been made in the past.  Current Meds  Medication Sig   albuterol (VENTOLIN HFA) 108 (90 Base) MCG/ACT inhaler Inhale 2 puffs into the lungs every 6 (six) hours as needed for wheezing or shortness of breath.   Cholecalciferol (VITAMIN D3) 50 MCG (2000 UT) capsule TAKE TWO CAPSULES BY MOUTH DAILY   clobetasol ointment (TEMOVATE) 0.05 % Apply topically 2 (two) times daily.   diltiazem (CARDIZEM) 30 MG tablet TAKE ONE TABLET BY MOUTH THREE TIMES DAILY AS NEEDED   diphenhydrAMINE (BENADRYL) 25 MG tablet Take 25 mg by mouth every 8 (eight) hours as needed for itching.   folic acid (FOLVITE) 1 MG tablet Take 1 mg by mouth daily.   ibuprofen (ADVIL) 600 MG tablet Take 600 mg by mouth as needed.   levothyroxine (SYNTHROID) 150 MCG tablet 1 pill a day except for 2 pills on Sundays.    loperamide (IMODIUM A-D) 2 MG tablet Take 2 mg by mouth 2 (two) times daily as needed for diarrhea or loose stools.   methotrexate 2.5 MG tablet TAKE 5 TABLETS BY MOUTH ONCE A WEEK - TAKE FOLIC ACID ALL OTHER DAYS OF THE WEEK   omeprazole (PRILOSEC) 20 MG capsule TAKE ONE CAPSULE BY MOUTH DAILY   promethazine (PHENERGAN) 25 MG tablet TAKE ONE TABLET BY MOUTH EVERY 6 HOURS AS NEEDED FOR NAUSEA   propranolol ER (INDERAL LA) 120 MG 24 hr capsule TAKE ONE CAPSULE BY MOUTH AT BEDTIME   rosuvastatin (CRESTOR) 5 MG tablet Take 1 tablet (5 mg total) by mouth at bedtime.   traMADol (ULTRAM) 50 MG tablet Take 1-2 tablets (50-100 mg total) by mouth every 12 (twelve) hours as needed. CAUTION SEDATION   XARELTO 20 MG TABS tablet TAKE ONE TABLET BY MOUTH IN THE EVENING WITH SUPPER    Past Medical History:  Diagnosis Date   Atrial fib/flutter, transient    1 episode, resolved, never on coumadin   Cataract 2019   left eye   Clotting disorder ([place])    on xarelto   Diverticulosis 03/1997   Dysrhythmia    atrial fibrilation   GERD (gastroesophageal reflux disease) 1997   good on prilosec   Hepatitis    hx of hep B (late 1970s)   Hyperlipidemia 08/1996   Hypothyroidism 1984   Migraine    w/o aura   Osteopenia 11/2002   via dexa   Presence of dental  prosthetic device    temporary while waiting for implant   Skin cancer    skin cancer   Stroke [facility]) 08/2011   TIA    Past Surgical History:  Procedure Laterality Date   ABDOMINAL HYSTERECTOMY     ANAL FISSURE REPAIR     CATARACT EXTRACTION W/PHACO Right 02/06/2018   Procedure: CATARACT EXTRACTION PHACO AND INTRAOCULAR LENS PLACEMENT ([place]) RIGHT;  Surgeon: Leandrew Koyanagi, MD;  Location: [place];  Service: Ophthalmology;  Laterality: Right;   CHOLECYSTECTOMY  07/2006   MOHS SURGERY  12/05/2006   left middle finger SCC excision of fingernail   nasolabial fold skin flap surgery  08/02/2007   [facility]   PARTIAL HYSTERECTOMY  1978-79    POLYPECTOMY  04/06/97   colon, benign by pathology   varicose vein ablation sclerotherapy  09/2008   Dr. Hulda Humphrey    Social History Social History   Tobacco Use   Smoking status: Former    Packs/day: 1.00    Types: Cigarettes    Quit date: 06/26/1986    Years since quitting: 34.7   Smokeless tobacco: Never  Vaping Use   Vaping Use: Never used  Substance Use Topics   Alcohol use: No   Drug use: No    Family History Family History  Problem Relation Age of Onset   Hypertension Mother    Osteopenia Mother    Stroke Mother        deceased age 22   Colon cancer Father 53       mets   Allergies Sister    Breast cancer Cousin        pat cousin   Rectal cancer Neg Hx     Allergies  Allergen Reactions   Celebrex [Celecoxib]     REACTION: nausea: GI UPSET   Doxycycline Other (See Comments)    GI upset- not an allergy.     Gold-Containing Drug Products    Nickel     Skin rash, allergy   Other     Fragrance, phenylenediamine, contrast metal agents, gold   Sodium Metabisulfite Other (See Comments)    Positive patch test   Zocor [Simvastatin]     Aches but tolerates crestor     REVIEW OF SYSTEMS (Negative unless checked)  Constitutional: [] Weight loss  [] Fever  [] Chills Cardiac: [] Chest pain   [] Chest pressure   [] Palpitations   [] Shortness of breath when laying flat   [] Shortness of breath with exertion. Vascular:  [] Pain in legs with walking   [] Pain in legs at rest  [] History of DVT   [] Phlebitis   [] Swelling in legs   [] Varicose veins   [] Non-healing ulcers Pulmonary:   [] Uses home oxygen   [] Productive cough   [] Hemoptysis   [] Wheeze  [] COPD   [] Asthma Neurologic:  [] Dizziness   [] Seizures   [] History of stroke   [] History of TIA  [] Aphasia   [] Vissual changes   [] Weakness or numbness in arm   [] Weakness or numbness in leg Musculoskeletal:   [] Joint swelling   [] Joint pain   [] Low back pain Hematologic:  [] Easy bruising  [] Easy bleeding   [] Hypercoagulable state    [] Anemic Gastrointestinal:  [] Diarrhea   [] Vomiting  [] Gastroesophageal reflux/heartburn   [] Difficulty swallowing. Genitourinary:  [] Chronic kidney disease   [] Difficult urination  [] Frequent urination   [] Blood in urine Skin:  [] Rashes   [] Ulcers  Psychological:  [] History of anxiety   []  History of major depression.  Physical Examination  Vitals:   03/07/21 1542  BP: (!) 156/81  Pulse: (!) 56  Weight: 222 lb (100.7 kg)  Height: 5\' 2"  (1.575 m)   Body mass index is 40.6 kg/m. Gen: WD/WN, NAD Head: [place]/AT, No temporalis wasting.  Ear/Nose/Throat: Hearing grossly intact, nares w/o erythema or drainage Eyes: PER, EOMI, sclera nonicteric.  Neck: Supple, no masses.  No bruit or JVD.  Pulmonary:  Good air movement, no audible wheezing, no use of accessory muscles.  Cardiac: RRR, normal S1, S2, no Murmurs. Vascular:   There is a palpable fullness at the base of the right neck which includes multiple palpable lymph nodes.  It is very mildly tender there are no skin changes overlying this it does not appear to be particularly fixed. Vessel Right Left  Radial Palpable Palpable  Carotid Palpable Palpable  Gastrointestinal: soft, non-distended. No guarding/no peritoneal signs.  Musculoskeletal: M/S 5/5 throughout.  No visible deformity.  Neurologic: CN 2-12 intact. Pain and light touch intact in extremities.  Symmetrical.  Speech is fluent. Motor exam as listed above. Psychiatric: Judgment intact, Mood & affect appropriate for pt's clinical situation. Dermatologic: No rashes or ulcers noted.  No changes consistent with cellulitis.   CBC Lab Results  Component Value Date   WBC 6.6 09/30/2020   HGB 12.2 09/30/2020   HCT 38.3 09/30/2020   MCV 81.7 09/30/2020   PLT 172 09/30/2020    BMET    Component Value Date/Time   NA 140 09/13/2020 0945   NA 139 08/26/2011 1334   K 4.0 09/13/2020 0945   K 3.8 08/26/2011 1334   CL 103 09/13/2020 0945   CL 100 08/26/2011 1334   CO2 31  09/13/2020 0945   CO2 26 08/26/2011 1334   GLUCOSE 113 (H) 09/13/2020 0945   GLUCOSE 129 (H) 08/26/2011 1334   BUN 8 09/13/2020 0945   BUN 12 08/26/2011 1334   CREATININE 0.62 09/13/2020 0945   CREATININE 0.80 08/26/2011 1334   CALCIUM 9.2 09/13/2020 0945   CALCIUM 9.2 08/26/2011 1334   GFRNONAA 53 (L) 08/09/2020 1723   GFRNONAA >60 08/26/2011 1334   GFRAA >60 10/28/2016 0246   GFRAA >60 08/26/2011 1334   CrCl cannot be calculated (Patient's most recent lab result is older than the maximum 21 days allowed.).  COAG Lab Results  Component Value Date   INR 1.1 09/30/2020   INR 1.01 08/28/2011    Radiology MR NECK SOFT TISSUE ONLY W WO CONTRAST  Result Date: 02/16/2021 CLINICAL DATA:  Enlarging right neck mass. Biopsy of retroperitoneal lymphadenopathy demonstrating non-necrotizing granulomatous inflammation. EXAM: MRI OF THE NECK WITH CONTRAST TECHNIQUE: Multiplanar, multisequence MR imaging was performed following the administration of intravenous contrast. CONTRAST:  15mL GADAVIST GADOBUTROL 1 MMOL/ML IV SOLN COMPARISON:  Neck CT 11/10/2019 FINDINGS: The study is intermittently up to moderately motion degraded. Pharynx and larynx: No evidence of mass or swelling.  Patent airway. Salivary glands: No evidence of a parotid or submandibular mass or inflammation. Thyroid: Not well visualized. Lymph nodes: Again seen in the right lower neck are numerous enhancing soft tissue nodules/lymph nodes individually measuring up to approximately 2 cm in maximal dimension with infiltrative stranding and enhancement in the surrounding soft tissues. This involves the supraclavicular region/levels IV and V, and the entire abnormality extends over an area measuring approximately 10.6 cm in transverse dimension. The abnormality appears marginated and there is some associated mass effect (including medial displacement of the right common carotid artery). This has slowly progressed over time but was present as  far back as 2012  on a cervical spine CT. No left-sided cervical lymphadenopathy is evident. Small lymph nodes with milder infiltrative changes in the surrounding fat are also again seen in the right axillary region. Vascular: Major vascular flow voids in the neck are preserved. Limited intracranial: Unremarkable. Visualized orbits: Not imaged. Mastoids and visualized paranasal sinuses: Clear. Skeleton: No suspicious marrow lesion. Mild cervical spondylosis with disc bulging at C6-7 resulting in mild spinal stenosis. Upper chest: Partially visualized borderline enlarged mediastinal and right hilar lymph nodes measuring up to approximately 1 cm in short axis. Other: None. IMPRESSION: Chronic lymphadenopathy in the right lower neck with infiltrative changes in the surrounding fat. This has been present since at least 2012 but demonstrates slow progression over time, and there are associated margins and mass effect. The overall appearance is unusual and not typical of lymphadenopathy related to sarcoidosis (and the patient's retroperitoneal lymphadenopathy does not demonstrate similar surrounding infiltrative changes). This is not felt to reflect a venolymphatic malformation given involvement of the right axilla. Given the atypical appearance and slow progression over time, consider biopsy of either the neck or axilla for further evaluation. Electronically Signed   By: Logan Bores M.D.   On: 02/16/2021 11:59     Assessment/Plan 1. Lymphadenopathy Recommend:  Given the findings on MR as well as the above-noted progression and the lack of a definitive diagnosis based on needle and core biopsy she is been referred for a more definitive diagnostic procedure.  I have recommended excision of this lymphatic pad at the base of the right neck.  Risks and benefits including but not exclusively lymph leak and seroma formation have been reviewed all questions have been answered patient has agreed to proceed we will arrange  for lymph node biopsy as an outpatient.  2. Paroxysmal atrial fibrillation (HCC) Continue antiarrhythmia medications as already ordered, these medications have been reviewed and there are no changes at this time.  I will plan to hold her Xarelto for 2 days prior to the surgery and then continue anticoagulation as ordered by Cardiology Service   3. Primary hypertension Continue antihypertensive medications as already ordered, these medications have been reviewed and there are no changes at this time.   4. Coronary artery disease of native heart with stable angina pectoris, unspecified vessel or lesion type ([place]) Continue cardiac and antihypertensive medications as already ordered and reviewed, no changes at this time.  Continue statin as ordered and reviewed, no changes at this time  Nitrates PRN for chest pain   5. Other hyperlipidemia Continue statin as ordered and reviewed, no changes at this time     [name], MD  03/09/2021 2:50 PM

## 2021-03-09 NOTE — Progress Notes (Signed)
MRN : [id number]  Kara Wright is a 75 y.o. (07-29-1945) female who presents with chief complaint of check the lymph nodes on my neck.  History of Present Illness:   Location: Base of right neck Character/quality of the symptom: Fullness mild tenderness Severity: Mild to moderate Duration: This area has been increasing in size for several months Timing/onset: Continuous Aggravating/context: None Relieving/modifying: None  Patient developed a fullness at the base of the neck which led to an MR of the soft tissue of the neck dated February 16, 2021.  This study is reviewed by me personally and demonstrates chronic lymphadenopathy of the right lower neck with infiltrate changes in the surrounding fat pad.  Based on radiologic documentation this is been present since 2012 but has increased with time.  Of note there is no involvement in the lymphatic tissues of the right axilla.  Patient does have an existing diagnosis of chronic lymphadenopathy and has documented retroperitoneal and pulmonary nodes present.  Biopsy has been performed in the past but this was not exclusively diagnostic.  Considerations for sarcoid have been made in the past.  Current Meds  Medication Sig   albuterol (VENTOLIN HFA) 108 (90 Base) MCG/ACT inhaler Inhale 2 puffs into the lungs every 6 (six) hours as needed for wheezing or shortness of breath.   Cholecalciferol (VITAMIN D3) 50 MCG (2000 UT) capsule TAKE TWO CAPSULES BY MOUTH DAILY   clobetasol ointment (TEMOVATE) 0.05 % Apply topically 2 (two) times daily.   diltiazem (CARDIZEM) 30 MG tablet TAKE ONE TABLET BY MOUTH THREE TIMES DAILY AS NEEDED   diphenhydrAMINE (BENADRYL) 25 MG tablet Take 25 mg by mouth every 8 (eight) hours as needed for itching.   folic acid (FOLVITE) 1 MG tablet Take 1 mg by mouth daily.   ibuprofen (ADVIL) 600 MG tablet Take 600 mg by mouth as needed.   levothyroxine (SYNTHROID) 150 MCG tablet 1 pill a day except for 2 pills on Sundays.    loperamide (IMODIUM A-D) 2 MG tablet Take 2 mg by mouth 2 (two) times daily as needed for diarrhea or loose stools.   methotrexate 2.5 MG tablet TAKE 5 TABLETS BY MOUTH ONCE A WEEK - TAKE FOLIC ACID ALL OTHER DAYS OF THE WEEK   omeprazole (PRILOSEC) 20 MG capsule TAKE ONE CAPSULE BY MOUTH DAILY   promethazine (PHENERGAN) 25 MG tablet TAKE ONE TABLET BY MOUTH EVERY 6 HOURS AS NEEDED FOR NAUSEA   propranolol ER (INDERAL LA) 120 MG 24 hr capsule TAKE ONE CAPSULE BY MOUTH AT BEDTIME   rosuvastatin (CRESTOR) 5 MG tablet Take 1 tablet (5 mg total) by mouth at bedtime.   traMADol (ULTRAM) 50 MG tablet Take 1-2 tablets (50-100 mg total) by mouth every 12 (twelve) hours as needed. CAUTION SEDATION   XARELTO 20 MG TABS tablet TAKE ONE TABLET BY MOUTH IN THE EVENING WITH SUPPER    Past Medical History:  Diagnosis Date   Atrial fib/flutter, transient    1 episode, resolved, never on coumadin   Cataract 2019   left eye   Clotting disorder ([place])    on xarelto   Diverticulosis 03/1997   Dysrhythmia    atrial fibrilation   GERD (gastroesophageal reflux disease) 1997   good on prilosec   Hepatitis    hx of hep B (late 1970s)   Hyperlipidemia 08/1996   Hypothyroidism 1984   Migraine    w/o aura   Osteopenia 11/2002   via dexa   Presence of dental  prosthetic device    temporary while waiting for implant   Skin cancer    skin cancer   Stroke [facility]) 08/2011   TIA    Past Surgical History:  Procedure Laterality Date   ABDOMINAL HYSTERECTOMY     ANAL FISSURE REPAIR     CATARACT EXTRACTION W/PHACO Right 02/06/2018   Procedure: CATARACT EXTRACTION PHACO AND INTRAOCULAR LENS PLACEMENT ([place]) RIGHT;  Surgeon: Leandrew Koyanagi, MD;  Location: [facility];  Service: Ophthalmology;  Laterality: Right;   CHOLECYSTECTOMY  07/2006   MOHS SURGERY  12/05/2006   left middle finger SCC excision of fingernail   nasolabial fold skin flap surgery  08/02/2007   [facility]   PARTIAL HYSTERECTOMY  1978-79    POLYPECTOMY  04/06/97   colon, benign by pathology   varicose vein ablation sclerotherapy  09/2008   Dr. Hulda Humphrey    Social History Social History   Tobacco Use   Smoking status: Former    Packs/day: 1.00    Types: Cigarettes    Quit date: 06/26/1986    Years since quitting: 34.7   Smokeless tobacco: Never  Vaping Use   Vaping Use: Never used  Substance Use Topics   Alcohol use: No   Drug use: No    Family History Family History  Problem Relation Age of Onset   Hypertension Mother    Osteopenia Mother    Stroke Mother        deceased age 72   Colon cancer Father 109       mets   Allergies Sister    Breast cancer Cousin        pat cousin   Rectal cancer Neg Hx     Allergies  Allergen Reactions   Celebrex [Celecoxib]     REACTION: nausea: GI UPSET   Doxycycline Other (See Comments)    GI upset- not an allergy.     Gold-Containing Drug Products    Nickel     Skin rash, allergy   Other     Fragrance, phenylenediamine, contrast metal agents, gold   Sodium Metabisulfite Other (See Comments)    Positive patch test   Zocor [Simvastatin]     Aches but tolerates crestor     REVIEW OF SYSTEMS (Negative unless checked)  Constitutional: [] Weight loss  [] Fever  [] Chills Cardiac: [] Chest pain   [] Chest pressure   [] Palpitations   [] Shortness of breath when laying flat   [] Shortness of breath with exertion. Vascular:  [] Pain in legs with walking   [] Pain in legs at rest  [] History of DVT   [] Phlebitis   [] Swelling in legs   [] Varicose veins   [] Non-healing ulcers Pulmonary:   [] Uses home oxygen   [] Productive cough   [] Hemoptysis   [] Wheeze  [] COPD   [] Asthma Neurologic:  [] Dizziness   [] Seizures   [] History of stroke   [] History of TIA  [] Aphasia   [] Vissual changes   [] Weakness or numbness in arm   [] Weakness or numbness in leg Musculoskeletal:   [] Joint swelling   [] Joint pain   [] Low back pain Hematologic:  [] Easy bruising  [] Easy bleeding   [] Hypercoagulable state    [] Anemic Gastrointestinal:  [] Diarrhea   [] Vomiting  [] Gastroesophageal reflux/heartburn   [] Difficulty swallowing. Genitourinary:  [] Chronic kidney disease   [] Difficult urination  [] Frequent urination   [] Blood in urine Skin:  [] Rashes   [] Ulcers  Psychological:  [] History of anxiety   []  History of major depression.  Physical Examination  Vitals:   03/07/21 1542  BP: (!) 156/81  Pulse: (!) 56  Weight: 222 lb (100.7 kg)  Height: 5\' 2"  (1.575 m)   Body mass index is 40.6 kg/m. Gen: WD/WN, NAD Head: [place]/AT, No temporalis wasting.  Ear/Nose/Throat: Hearing grossly intact, nares w/o erythema or drainage Eyes: PER, EOMI, sclera nonicteric.  Neck: Supple, no masses.  No bruit or JVD.  Pulmonary:  Good air movement, no audible wheezing, no use of accessory muscles.  Cardiac: RRR, normal S1, S2, no Murmurs. Vascular:   There is a palpable fullness at the base of the right neck which includes multiple palpable lymph nodes.  It is very mildly tender there are no skin changes overlying this it does not appear to be particularly fixed. Vessel Right Left  Radial Palpable Palpable  Carotid Palpable Palpable  Gastrointestinal: soft, non-distended. No guarding/no peritoneal signs.  Musculoskeletal: M/S 5/5 throughout.  No visible deformity.  Neurologic: CN 2-12 intact. Pain and light touch intact in extremities.  Symmetrical.  Speech is fluent. Motor exam as listed above. Psychiatric: Judgment intact, Mood & affect appropriate for pt's clinical situation. Dermatologic: No rashes or ulcers noted.  No changes consistent with cellulitis.   CBC Lab Results  Component Value Date   WBC 6.6 09/30/2020   HGB 12.2 09/30/2020   HCT 38.3 09/30/2020   MCV 81.7 09/30/2020   PLT 172 09/30/2020    BMET    Component Value Date/Time   NA 140 09/13/2020 0945   NA 139 08/26/2011 1334   K 4.0 09/13/2020 0945   K 3.8 08/26/2011 1334   CL 103 09/13/2020 0945   CL 100 08/26/2011 1334   CO2 31  09/13/2020 0945   CO2 26 08/26/2011 1334   GLUCOSE 113 (H) 09/13/2020 0945   GLUCOSE 129 (H) 08/26/2011 1334   BUN 8 09/13/2020 0945   BUN 12 08/26/2011 1334   CREATININE 0.62 09/13/2020 0945   CREATININE 0.80 08/26/2011 1334   CALCIUM 9.2 09/13/2020 0945   CALCIUM 9.2 08/26/2011 1334   GFRNONAA 53 (L) 08/09/2020 1723   GFRNONAA >60 08/26/2011 1334   GFRAA >60 10/28/2016 0246   GFRAA >60 08/26/2011 1334   CrCl cannot be calculated (Patient's most recent lab result is older than the maximum 21 days allowed.).  COAG Lab Results  Component Value Date   INR 1.1 09/30/2020   INR 1.01 08/28/2011    Radiology MR NECK SOFT TISSUE ONLY W WO CONTRAST  Result Date: 02/16/2021 CLINICAL DATA:  Enlarging right neck mass. Biopsy of retroperitoneal lymphadenopathy demonstrating non-necrotizing granulomatous inflammation. EXAM: MRI OF THE NECK WITH CONTRAST TECHNIQUE: Multiplanar, multisequence MR imaging was performed following the administration of intravenous contrast. CONTRAST:  35mL GADAVIST GADOBUTROL 1 MMOL/ML IV SOLN COMPARISON:  Neck CT 11/10/2019 FINDINGS: The study is intermittently up to moderately motion degraded. Pharynx and larynx: No evidence of mass or swelling.  Patent airway. Salivary glands: No evidence of a parotid or submandibular mass or inflammation. Thyroid: Not well visualized. Lymph nodes: Again seen in the right lower neck are numerous enhancing soft tissue nodules/lymph nodes individually measuring up to approximately 2 cm in maximal dimension with infiltrative stranding and enhancement in the surrounding soft tissues. This involves the supraclavicular region/levels IV and V, and the entire abnormality extends over an area measuring approximately 10.6 cm in transverse dimension. The abnormality appears marginated and there is some associated mass effect (including medial displacement of the right common carotid artery). This has slowly progressed over time but was present as  far back as 2012  on a cervical spine CT. No left-sided cervical lymphadenopathy is evident. Small lymph nodes with milder infiltrative changes in the surrounding fat are also again seen in the right axillary region. Vascular: Major vascular flow voids in the neck are preserved. Limited intracranial: Unremarkable. Visualized orbits: Not imaged. Mastoids and visualized paranasal sinuses: Clear. Skeleton: No suspicious marrow lesion. Mild cervical spondylosis with disc bulging at C6-7 resulting in mild spinal stenosis. Upper chest: Partially visualized borderline enlarged mediastinal and right hilar lymph nodes measuring up to approximately 1 cm in short axis. Other: None. IMPRESSION: Chronic lymphadenopathy in the right lower neck with infiltrative changes in the surrounding fat. This has been present since at least 2012 but demonstrates slow progression over time, and there are associated margins and mass effect. The overall appearance is unusual and not typical of lymphadenopathy related to sarcoidosis (and the patient's retroperitoneal lymphadenopathy does not demonstrate similar surrounding infiltrative changes). This is not felt to reflect a venolymphatic malformation given involvement of the right axilla. Given the atypical appearance and slow progression over time, consider biopsy of either the neck or axilla for further evaluation. Electronically Signed   By: Logan Bores M.D.   On: 02/16/2021 11:59     Assessment/Plan 1. Lymphadenopathy Recommend:  Given the findings on MR as well as the above-noted progression and the lack of a definitive diagnosis based on needle and core biopsy she is been referred for a more definitive diagnostic procedure.  I have recommended excision of this lymphatic pad at the base of the right neck.  Risks and benefits including but not exclusively lymph leak and seroma formation have been reviewed all questions have been answered patient has agreed to proceed we will arrange  for lymph node biopsy as an outpatient.  2. Paroxysmal atrial fibrillation (HCC) Continue antiarrhythmia medications as already ordered, these medications have been reviewed and there are no changes at this time.  I will plan to hold her Xarelto for 2 days prior to the surgery and then continue anticoagulation as ordered by Cardiology Service   3. Primary hypertension Continue antihypertensive medications as already ordered, these medications have been reviewed and there are no changes at this time.   4. Coronary artery disease of native heart with stable angina pectoris, unspecified vessel or lesion type ([place]) Continue cardiac and antihypertensive medications as already ordered and reviewed, no changes at this time.  Continue statin as ordered and reviewed, no changes at this time  Nitrates PRN for chest pain   5. Other hyperlipidemia Continue statin as ordered and reviewed, no changes at this time     [name], MD  03/09/2021 2:50 PM

## 2021-03-15 ENCOUNTER — Other Ambulatory Visit: Payer: Self-pay

## 2021-03-15 ENCOUNTER — Encounter (INDEPENDENT_AMBULATORY_CARE_PROVIDER_SITE_OTHER): Payer: Self-pay

## 2021-03-15 ENCOUNTER — Telehealth (INDEPENDENT_AMBULATORY_CARE_PROVIDER_SITE_OTHER): Payer: Self-pay

## 2021-03-15 ENCOUNTER — Other Ambulatory Visit (INDEPENDENT_AMBULATORY_CARE_PROVIDER_SITE_OTHER): Payer: Self-pay | Admitting: Nurse Practitioner

## 2021-03-15 ENCOUNTER — Other Ambulatory Visit
Admission: RE | Admit: 2021-03-15 | Payer: PPO | Source: Ambulatory Visit | Attending: Vascular Surgery | Admitting: Vascular Surgery

## 2021-03-15 ENCOUNTER — Other Ambulatory Visit
Admission: RE | Admit: 2021-03-15 | Discharge: 2021-03-15 | Disposition: A | Discharge status: Home / Self Care | Payer: PPO | Source: Ambulatory Visit | Attending: Vascular Surgery | Admitting: Vascular Surgery

## 2021-03-15 DIAGNOSIS — Z01818 Encounter for other preprocedural examination: Secondary | ICD-10-CM | POA: Insufficient documentation

## 2021-03-15 DIAGNOSIS — Z01810 Encounter for preprocedural cardiovascular examination: Secondary | ICD-10-CM | POA: Diagnosis not present

## 2021-03-15 HISTORY — DX: Dyspnea, unspecified: R06.00

## 2021-03-15 HISTORY — DX: Sleep apnea, unspecified: G47.30

## 2021-03-15 LAB — CBC WITH DIFFERENTIAL/PLATELET
Abs Immature Granulocytes: 0.02 10*3/uL (ref 0.00–0.07)
Basophils Absolute: 0 10*3/uL (ref 0.0–0.1)
Basophils Relative: 0 %
Eosinophils Absolute: 0.2 10*3/uL (ref 0.0–0.5)
Eosinophils Relative: 3 %
HCT: 35.5 % — ABNORMAL LOW (ref 36.0–46.0)
Hemoglobin: 11.3 g/dL — ABNORMAL LOW (ref 12.0–15.0)
Immature Granulocytes: 0 %
Lymphocytes Relative: 33 %
Lymphs Abs: 2.2 10*3/uL (ref 0.7–4.0)
MCH: 25.9 pg — ABNORMAL LOW (ref 26.0–34.0)
MCHC: 31.8 g/dL (ref 30.0–36.0)
MCV: 81.2 fL (ref 80.0–100.0)
Monocytes Absolute: 0.5 10*3/uL (ref 0.1–1.0)
Monocytes Relative: 8 %
Neutro Abs: 3.8 10*3/uL (ref 1.7–7.7)
Neutrophils Relative %: 56 %
Platelets: 171 10*3/uL (ref 150–400)
RBC: 4.37 MIL/uL (ref 3.87–5.11)
RDW: 16.1 % — ABNORMAL HIGH (ref 11.5–15.5)
WBC: 6.8 10*3/uL (ref 4.0–10.5)
nRBC: 0 % (ref 0.0–0.2)

## 2021-03-15 LAB — BASIC METABOLIC PANEL
Anion gap: 7 (ref 5–15)
BUN: 13 mg/dL (ref 8–23)
CO2: 30 mmol/L (ref 22–32)
Calcium: 8.8 mg/dL — ABNORMAL LOW (ref 8.9–10.3)
Chloride: 100 mmol/L (ref 98–111)
Creatinine, Ser: 0.79 mg/dL (ref 0.44–1.00)
GFR, Estimated: >60 mL/min (ref >60)
Glucose, Bld: 109 mg/dL — ABNORMAL HIGH (ref 70–99)
Potassium: 3.6 mmol/L (ref 3.5–5.1)
Sodium: 137 mmol/L (ref 135–145)

## 2021-03-15 MED ORDER — LACTATED RINGERS IV SOLN: INTRAVENOUS | Status: DC

## 2021-03-15 MED ORDER — CEFAZOLIN SODIUM-DEXTROSE 2-4 GM/100ML-% IV SOLN
2.0000 g | INTRAVENOUS | Status: AC
Start: 2021-03-16 — End: 2021-03-16
  Administered 2021-03-16: 2 g via INTRAVENOUS

## 2021-03-15 MED ORDER — CHLORHEXIDINE GLUCONATE CLOTH 2 % EX PADS: 6.0000 | MEDICATED_PAD | Freq: Once | CUTANEOUS | Status: DC

## 2021-03-15 MED ORDER — CHLORHEXIDINE GLUCONATE 0.12 % MT SOLN: 15.0000 mL | Freq: Once | OROMUCOSAL | Status: DC

## 2021-03-15 MED ORDER — ORAL CARE MOUTH RINSE: 15.0000 mL | Freq: Once | OROMUCOSAL | Status: DC

## 2021-03-15 NOTE — Telephone Encounter (Signed)
I called the pt and made her aware of her Rt neck Bx scheduled for 9/28  and her arrival time and pre op call the pt voiced understanding.

## 2021-03-15 NOTE — Patient Instructions (Addendum)
Your procedure is scheduled on:Wednesday 03/16/21 Report to the Registration Desk on the 1st floor of the [facility]. To find out your arrival time, please call [phone] between 1PM - 3PM [id number] 03/15/21  REMEMBER: Instructions that are not followed completely may result in serious medical risk, up to and including death; or upon the discretion of your surgeon and anesthesiologist your surgery may need to be rescheduled.  Do not eat food after midnight the night before surgery.  No gum chewing, lozengers or hard candies.  You may however, drink CLEAR liquids up to 2 hours before you are scheduled to arrive for your surgery. Do not drink anything within 2 hours of your scheduled arrival time.  Clear liquids include: - water  - apple juice without pulp - gatorade (not RED, PURPLE, OR BLUE) - black coffee or tea (Do NOT add milk or creamers to the coffee or tea) Do NOT drink anything that is not on this list.  TAKE THESE MEDICATIONS THE MORNING OF SURGERY WITH A SIP OF WATER:  levothyroxine (SYNTHROID) 150 MCG tablet omeprazole (PRILOSEC) 20 MG capsule (take one the night before and one on the morning of surgery - helps to prevent nausea after surgery.)  Use albuterol (VENTOLIN HFA) 108 (90 Base) MCG/ACT inhaler inhaler on the day of surgery and bring to the hospital.  Do not take Xarelto, Vitamin D3, Folic acid, ibuprofen tonight. Please take Crestor tonight Propranolol One week prior to surgery: Stop Anti-inflammatories (NSAIDS) such as Advil, Aleve, Ibuprofen, Motrin, Naproxen, Naprosyn and Aspirin based products such as Excedrin, Goodys Powder, BC Powder. Stop ANY OVER THE COUNTER supplements until after surgery. You may however, continue to take Tylenol if needed for pain up until the day of surgery.  No Alcohol for 24 hours before or after surgery.  No Smoking including e-cigarettes for 24 hours prior to surgery.  No chewable tobacco products for at least 6 hours  prior to surgery.  No nicotine patches on the day of surgery.  Do not use any "recreational" drugs for at least a week prior to your surgery.  Please be advised that the combination of cocaine and anesthesia may have negative outcomes, up to and including death. If you test positive for cocaine, your surgery will be cancelled.  On the morning of surgery brush your teeth with toothpaste and water, you may rinse your mouth with mouthwash if you wish. Do not swallow any toothpaste or mouthwash.  Use CHG Soap or wipes as directed on instruction sheet.  Do not wear jewelry, make-up, hairpins, clips or nail polish.  Do not wear lotions, powders, or perfumes.   Do not shave body from the neck down 48 hours prior to surgery just in case you cut yourself which could leave a site for infection.  Also, freshly shaved skin may become irritated if using the CHG soap.  Do not bring valuables to the hospital. [facility] is not responsible for any missing/lost belongings or valuables.   Notify your doctor if there is any change in your medical condition (cold, fever, infection).  Wear comfortable clothing (specific to your surgery type) to the hospital.  After surgery, you can help prevent lung complications by doing breathing exercises.  Take deep breaths and cough every 1-2 hours. Your doctor may order a device called an Incentive Spirometer to help you take deep breaths.  If you are being discharged the day of surgery, you will not be allowed to drive home. You will need a responsible  adult (18 years or older) to drive you home and stay with you that night.   If you are taking public transportation, you will need to have a responsible adult (18 years or older) with you. Please confirm with your physician that it is acceptable to use public transportation.   Please call the [place] Dept. at [phone] if you have any questions about these instructions.  Surgery Visitation  Policy:  Patients undergoing a surgery or procedure may have one family member or support person with them as long as that person is not COVID-19 positive or experiencing its symptoms.  That person may remain in the waiting area during the procedure and may rotate out with other people.  Inpatient Visitation:    Visiting hours are 7 a.m. to 8 p.m. Up to two visitors ages 16+ are allowed at one time in a patient room. The visitors may rotate out with other people during the day. Visitors must check out when they leave, or other visitors will not be allowed. One designated support person may remain overnight. The visitor must pass COVID-19 screenings, use hand sanitizer when entering and exiting the patient's room and wear a mask at all times, including in the patient's room. Patients must also wear a mask when staff or their visitor are in the room. Masking is required regardless of vaccination status.

## 2021-03-15 NOTE — Patient Instructions (Signed)
Your procedure is scheduled on: Report to the Registration Desk on the 1st floor of the [facility]. To find out your arrival time, please call [phone] between 1PM - 3PM on:  REMEMBER: Instructions that are not followed completely may result in serious medical risk, up to and including death; or upon the discretion of your surgeon and anesthesiologist your surgery may need to be rescheduled.  Do not eat food after midnight the night before surgery.  No gum chewing, lozengers or hard candies.  You may however, drink CLEAR liquids up to 2 hours before you are scheduled to arrive for your surgery. Do not drink anything within 2 hours of your scheduled arrival time.  Clear liquids include: - water  - apple juice without pulp - gatorade (not RED, PURPLE, OR BLUE) - black coffee or tea (Do NOT add milk or creamers to the coffee or tea) Do NOT drink anything that is not on this list.  Type 1 and Type 2 diabetics should only drink water.  In addition, your doctor has ordered for you to drink the provided  Ensure Pre-Surgery Clear Carbohydrate Drink  Gatorade G2 Drinking this carbohydrate drink up to two hours before surgery helps to reduce insulin resistance and improve patient outcomes. Please complete drinking 2 hours prior to scheduled arrival time.  TAKE THESE MEDICATIONS THE MORNING OF SURGERY WITH A SIP OF WATER:  (take one the night before and one on the morning of surgery - helps to prevent nausea after surgery.)  Use inhalers on the day of surgery and bring to the hospital.  **Follow new guidelines for insulin and diabetes medications.**  Follow recommendations from Cardiologist, Pulmonologist or PCP regarding stopping Aspirin, Coumadin, Plavix, Eliquis, Pradaxa, or Pletal.  One week prior to surgery: Stop Anti-inflammatories (NSAIDS) such as Advil, Aleve, Ibuprofen, Motrin, Naproxen, Naprosyn and Aspirin based products such as Excedrin, Goodys Powder, BC Powder. Stop  ANY OVER THE COUNTER supplements until after surgery. You may however, continue to take Tylenol if needed for pain up until the day of surgery.  No Alcohol for 24 hours before or after surgery.  No Smoking including e-cigarettes for 24 hours prior to surgery.  No chewable tobacco products for at least 6 hours prior to surgery.  No nicotine patches on the day of surgery.  Do not use any "recreational" drugs for at least a week prior to your surgery.  Please be advised that the combination of cocaine and anesthesia may have negative outcomes, up to and including death. If you test positive for cocaine, your surgery will be cancelled.  On the morning of surgery brush your teeth with toothpaste and water, you may rinse your mouth with mouthwash if you wish. Do not swallow any toothpaste or mouthwash.  Use CHG Soap or wipes as directed on instruction sheet.  Do not wear jewelry, make-up, hairpins, clips or nail polish.  Do not wear lotions, powders, or perfumes.   Do not shave body from the neck down 48 hours prior to surgery just in case you cut yourself which could leave a site for infection.  Also, freshly shaved skin may become irritated if using the CHG soap.  Contact lenses, hearing aids and dentures may not be worn into surgery.  Do not bring valuables to the hospital. [facility] is not responsible for any missing/lost belongings or valuables.   Total Shoulder Arthroplasty:  use Benzolyl Peroxide 5% Gel as directed on instruction sheet.  Fleets enema or bowel prep as directed.  Bring your C-PAP to the hospital with you in case you may have to spend the night.   Notify your doctor if there is any change in your medical condition (cold, fever, infection).  Wear comfortable clothing (specific to your surgery type) to the hospital.  After surgery, you can help prevent lung complications by doing breathing exercises.  Take deep breaths and cough every 1-2 hours. Your doctor may  order a device called an Incentive Spirometer to help you take deep breaths. When coughing or sneezing, hold a pillow firmly against your incision with both hands. This is called "splinting." Doing this helps protect your incision. It also decreases belly discomfort.  If you are being admitted to the hospital overnight, leave your suitcase in the car. After surgery it may be brought to your room.  If you are being discharged the day of surgery, you will not be allowed to drive home. You will need a responsible adult (18 years or older) to drive you home and stay with you that night.   If you are taking public transportation, you will need to have a responsible adult (18 years or older) with you. Please confirm with your physician that it is acceptable to use public transportation.   Please call the [facility] Dept. at [phone] if you have any questions about these instructions.  Surgery Visitation Policy:  Patients undergoing a surgery or procedure may have one family member or support person with them as long as that person is not COVID-19 positive or experiencing its symptoms.  That person may remain in the waiting area during the procedure and may rotate out with other people.  Inpatient Visitation:    Visiting hours are 7 a.m. to 8 p.m. Up to two visitors ages 16+ are allowed at one time in a patient room. The visitors may rotate out with other people during the day. Visitors must check out when they leave, or other visitors will not be allowed. One designated support person may remain overnight. The visitor must pass COVID-19 screenings, use hand sanitizer when entering and exiting the patient's room and wear a mask at all times, including in the patient's room. Patients must also wear a mask when staff or their visitor are in the room. Masking is required regardless of vaccination status.

## 2021-03-16 ENCOUNTER — Ambulatory Visit: Payer: PPO | Admitting: Anesthesiology

## 2021-03-16 ENCOUNTER — Encounter
Admission: RE | Disposition: A | Discharge status: Home / Self Care | Payer: Self-pay | Source: Home / Self Care | Attending: Vascular Surgery

## 2021-03-16 ENCOUNTER — Encounter: Payer: Self-pay | Admitting: Vascular Surgery

## 2021-03-16 ENCOUNTER — Ambulatory Visit
Admission: RE | Admit: 2021-03-16 | Discharge: 2021-03-16 | Disposition: A | Discharge status: Home / Self Care | Payer: PPO | Attending: Vascular Surgery | Admitting: Vascular Surgery

## 2021-03-16 DIAGNOSIS — E785 Hyperlipidemia, unspecified: Secondary | ICD-10-CM | POA: Diagnosis not present

## 2021-03-16 DIAGNOSIS — Z87891 Personal history of nicotine dependence: Secondary | ICD-10-CM | POA: Insufficient documentation

## 2021-03-16 DIAGNOSIS — I480 Paroxysmal atrial fibrillation: Secondary | ICD-10-CM | POA: Insufficient documentation

## 2021-03-16 DIAGNOSIS — R590 Localized enlarged lymph nodes: Secondary | ICD-10-CM | POA: Diagnosis present

## 2021-03-16 DIAGNOSIS — Z7901 Long term (current) use of anticoagulants: Secondary | ICD-10-CM | POA: Diagnosis not present

## 2021-03-16 DIAGNOSIS — Z79899 Other long term (current) drug therapy: Secondary | ICD-10-CM | POA: Insufficient documentation

## 2021-03-16 DIAGNOSIS — I10 Essential (primary) hypertension: Secondary | ICD-10-CM | POA: Diagnosis not present

## 2021-03-16 DIAGNOSIS — Z79890 Hormone replacement therapy (postmenopausal): Secondary | ICD-10-CM | POA: Insufficient documentation

## 2021-03-16 DIAGNOSIS — I4891 Unspecified atrial fibrillation: Secondary | ICD-10-CM | POA: Insufficient documentation

## 2021-03-16 DIAGNOSIS — I2510 Atherosclerotic heart disease of native coronary artery without angina pectoris: Secondary | ICD-10-CM | POA: Diagnosis not present

## 2021-03-16 DIAGNOSIS — I898 Other specified noninfective disorders of lymphatic vessels and lymph nodes: Secondary | ICD-10-CM | POA: Insufficient documentation

## 2021-03-16 DIAGNOSIS — R591 Generalized enlarged lymph nodes: Secondary | ICD-10-CM | POA: Diagnosis not present

## 2021-03-16 DIAGNOSIS — I4892 Unspecified atrial flutter: Secondary | ICD-10-CM | POA: Insufficient documentation

## 2021-03-16 DIAGNOSIS — E039 Hypothyroidism, unspecified: Secondary | ICD-10-CM | POA: Insufficient documentation

## 2021-03-16 DIAGNOSIS — R599 Enlarged lymph nodes, unspecified: Secondary | ICD-10-CM | POA: Diagnosis not present

## 2021-03-16 HISTORY — PX: LYMPH NODE BIOPSY: SHX201

## 2021-03-16 LAB — ABO/RH: ABO/RH(D): A POS

## 2021-03-16 SURGERY — LYMPH NODE BIOPSY
Anesthesia: General | Site: Neck | Laterality: Right

## 2021-03-16 MED ORDER — ROCURONIUM BROMIDE 100 MG/10ML IV SOLN
INTRAVENOUS | Status: DC | PRN
  Administered 2021-03-16: 50 mg via INTRAVENOUS

## 2021-03-16 MED ORDER — HYDROMORPHONE HCL 1 MG/ML IJ SOLN: 1.0000 mg | Freq: Once | INTRAMUSCULAR | Status: DC | PRN

## 2021-03-16 MED ORDER — GLYCOPYRROLATE 0.2 MG/ML IJ SOLN
INTRAMUSCULAR | Status: AC
  Filled 2021-03-16: qty 1

## 2021-03-16 MED ORDER — BUPIVACAINE LIPOSOME 1.3 % IJ SUSP
INTRAMUSCULAR | Status: DC | PRN
  Administered 2021-03-16: 25 mL

## 2021-03-16 MED ORDER — PROPOFOL 10 MG/ML IV BOLUS
INTRAVENOUS | Status: DC | PRN
  Administered 2021-03-16: 50 mg via INTRAVENOUS
  Administered 2021-03-16: 30 mg via INTRAVENOUS
  Administered 2021-03-16: 100 mg via INTRAVENOUS

## 2021-03-16 MED ORDER — SUGAMMADEX SODIUM 200 MG/2ML IV SOLN
INTRAVENOUS | Status: DC | PRN
  Administered 2021-03-16: 200 mg via INTRAVENOUS
  Administered 2021-03-16: 100 mg via INTRAVENOUS

## 2021-03-16 MED ORDER — FENTANYL CITRATE (PF) 100 MCG/2ML IJ SOLN
INTRAMUSCULAR | Status: AC
  Filled 2021-03-16: qty 2

## 2021-03-16 MED ORDER — FENTANYL CITRATE (PF) 100 MCG/2ML IJ SOLN
INTRAMUSCULAR | Status: DC | PRN
  Administered 2021-03-16 (×2): 50 ug via INTRAVENOUS

## 2021-03-16 MED ORDER — PROPOFOL 10 MG/ML IV BOLUS
INTRAVENOUS | Status: AC
  Filled 2021-03-16: qty 20

## 2021-03-16 MED ORDER — OXYCODONE HCL 5 MG PO TABS: 5.0000 mg | ORAL_TABLET | Freq: Once | ORAL | Status: DC | PRN

## 2021-03-16 MED ORDER — HYDROCODONE-ACETAMINOPHEN 5-325 MG PO TABS: 1.0000 | ORAL_TABLET | Freq: Four times a day (QID) | ORAL | 0 refills | Status: DC | PRN

## 2021-03-16 MED ORDER — BUPIVACAINE LIPOSOME 1.3 % IJ SUSP
INTRAMUSCULAR | Status: AC
  Filled 2021-03-16: qty 20

## 2021-03-16 MED ORDER — GLYCOPYRROLATE 0.2 MG/ML IJ SOLN
INTRAMUSCULAR | Status: DC | PRN
  Administered 2021-03-16: .2 mg via INTRAVENOUS

## 2021-03-16 MED ORDER — 0.9 % SODIUM CHLORIDE (POUR BTL) OPTIME
TOPICAL | Status: DC | PRN
  Administered 2021-03-16: 50 mL

## 2021-03-16 MED ORDER — ROCURONIUM BROMIDE 10 MG/ML (PF) SYRINGE
PREFILLED_SYRINGE | INTRAVENOUS | Status: AC
  Filled 2021-03-16: qty 10

## 2021-03-16 MED ORDER — DEXAMETHASONE SODIUM PHOSPHATE 10 MG/ML IJ SOLN
INTRAMUSCULAR | Status: AC
  Filled 2021-03-16: qty 1

## 2021-03-16 MED ORDER — OXYCODONE HCL 5 MG/5ML PO SOLN: 5.0000 mg | Freq: Once | ORAL | Status: DC | PRN

## 2021-03-16 MED ORDER — CHLORHEXIDINE GLUCONATE 0.12 % MT SOLN
OROMUCOSAL | Status: AC
  Filled 2021-03-16: qty 15

## 2021-03-16 MED ORDER — CEFAZOLIN SODIUM-DEXTROSE 2-4 GM/100ML-% IV SOLN
INTRAVENOUS | Status: AC
  Filled 2021-03-16: qty 100

## 2021-03-16 MED ORDER — APREPITANT 40 MG PO CAPS
40.0000 mg | ORAL_CAPSULE | Freq: Once | ORAL | Status: AC
  Administered 2021-03-16: 40 mg via ORAL

## 2021-03-16 MED ORDER — EPHEDRINE SULFATE 50 MG/ML IJ SOLN
INTRAMUSCULAR | Status: DC | PRN
  Administered 2021-03-16 (×2): 10 mg via INTRAVENOUS
  Administered 2021-03-16: 5 mg via INTRAVENOUS

## 2021-03-16 MED ORDER — FENTANYL CITRATE (PF) 100 MCG/2ML IJ SOLN: 25.0000 ug | INTRAMUSCULAR | Status: DC | PRN

## 2021-03-16 MED ORDER — EPHEDRINE 5 MG/ML INJ
INTRAVENOUS | Status: AC
  Filled 2021-03-16: qty 5

## 2021-03-16 MED ORDER — APREPITANT 40 MG PO CAPS
ORAL_CAPSULE | ORAL | Status: AC
  Filled 2021-03-16: qty 1

## 2021-03-16 MED ORDER — LIDOCAINE HCL (CARDIAC) PF 100 MG/5ML IV SOSY
PREFILLED_SYRINGE | INTRAVENOUS | Status: DC | PRN
  Administered 2021-03-16: 100 mg via INTRAVENOUS

## 2021-03-16 MED ORDER — LIDOCAINE HCL (PF) 2 % IJ SOLN
INTRAMUSCULAR | Status: AC
  Filled 2021-03-16: qty 5

## 2021-03-16 MED ORDER — DEXAMETHASONE SODIUM PHOSPHATE 10 MG/ML IJ SOLN
INTRAMUSCULAR | Status: DC | PRN
  Administered 2021-03-16: 10 mg via INTRAVENOUS

## 2021-03-16 MED ORDER — ONDANSETRON HCL 4 MG/2ML IJ SOLN
INTRAMUSCULAR | Status: AC
  Filled 2021-03-16: qty 2

## 2021-03-16 MED ORDER — BUPIVACAINE-EPINEPHRINE (PF) 0.5% -1:200000 IJ SOLN
INTRAMUSCULAR | Status: AC
  Filled 2021-03-16: qty 30

## 2021-03-16 MED ORDER — PHENYLEPHRINE HCL (PRESSORS) 10 MG/ML IV SOLN
INTRAVENOUS | Status: DC | PRN
  Administered 2021-03-16: 100 ug via INTRAVENOUS
  Administered 2021-03-16: 200 ug via INTRAVENOUS
  Administered 2021-03-16 (×2): 100 ug via INTRAVENOUS

## 2021-03-16 MED ORDER — BUPIVACAINE HCL (PF) 0.5 % IJ SOLN
INTRAMUSCULAR | Status: AC
  Filled 2021-03-16: qty 30

## 2021-03-16 MED ORDER — ONDANSETRON HCL 4 MG/2ML IJ SOLN
4.0000 mg | Freq: Four times a day (QID) | INTRAMUSCULAR | Status: DC | PRN
  Administered 2021-03-16: 4 mg via INTRAVENOUS

## 2021-03-16 SURGICAL SUPPLY — 41 items
ADH SKN CLS APL DERMABOND .7 (GAUZE/BANDAGES/DRESSINGS) ×1
APL PRP STRL LF DISP 70% ISPRP (MISCELLANEOUS) ×1
BNDG COHESIVE 4X5 TAN ST LF (GAUZE/BANDAGES/DRESSINGS) ×1 IMPLANT
BNDG GAUZE ELAST 4 BULKY (GAUZE/BANDAGES/DRESSINGS) ×1 IMPLANT
CHLORAPREP W/TINT 26 (MISCELLANEOUS) ×2 IMPLANT
DERMABOND ADVANCED (GAUZE/BANDAGES/DRESSINGS) ×1
DERMABOND ADVANCED .7 DNX12 (GAUZE/BANDAGES/DRESSINGS) IMPLANT
DRAPE THYROID T SHEET (DRAPES) ×1 IMPLANT
DRSG TELFA 4X3 1S NADH ST (GAUZE/BANDAGES/DRESSINGS) ×1 IMPLANT
ELECT CAUTERY BLADE 6.4 (BLADE) ×2 IMPLANT
ELECT REM PT RETURN 9FT ADLT (ELECTROSURGICAL) ×2
ELECTRODE REM PT RTRN 9FT ADLT (ELECTROSURGICAL) ×1 IMPLANT
GAUZE 4X4 16PLY ~~LOC~~+RFID DBL (SPONGE) ×1 IMPLANT
GAUZE SPONGE 4X4 12PLY STRL (GAUZE/BANDAGES/DRESSINGS) ×2 IMPLANT
GLOVE SURG SYN 8.0 (GLOVE) ×2 IMPLANT
GLOVE SURG SYN 8.0 PF PI (GLOVE) ×1 IMPLANT
GOWN STRL REUS W/ TWL LRG LVL3 (GOWN DISPOSABLE) ×1 IMPLANT
GOWN STRL REUS W/ TWL XL LVL3 (GOWN DISPOSABLE) ×2 IMPLANT
GOWN STRL REUS W/TWL LRG LVL3 (GOWN DISPOSABLE) ×2
GOWN STRL REUS W/TWL XL LVL3 (GOWN DISPOSABLE) ×4
KIT TURNOVER KIT A (KITS) ×2 IMPLANT
LABEL OR SOLS (LABEL) ×2 IMPLANT
MANIFOLD NEPTUNE II (INSTRUMENTS) ×2 IMPLANT
NS IRRIG 500ML POUR BTL (IV SOLUTION) ×2 IMPLANT
PACK EXTREMITY ARMC (MISCELLANEOUS) ×1 IMPLANT
PAD PREP 24X41 OB/GYN DISP (PERSONAL CARE ITEMS) ×2 IMPLANT
PUNCH BIOPSY 3 (MISCELLANEOUS) ×1 IMPLANT
PUNCH BIOPSY 4MM (MISCELLANEOUS)
PUNCH BIOPSY DERMAL 6MM STRL (MISCELLANEOUS) ×1 IMPLANT
PUNCH BIOPSY DISP 4 (MISCELLANEOUS) ×1 IMPLANT
SOL PREP PVP 2OZ (MISCELLANEOUS) ×2
SOLUTION PREP PVP 2OZ (MISCELLANEOUS) ×1 IMPLANT
SPONGE T-LAP 18X18 ~~LOC~~+RFID (SPONGE) ×2 IMPLANT
STOCKINETTE IMPERV 14X48 (MISCELLANEOUS) ×1 IMPLANT
SUT MNCRL AB 4-0 PS2 18 (SUTURE) ×1 IMPLANT
SUT SILK 3 0 (SUTURE) ×2
SUT SILK 3-0 18XBRD TIE 12 (SUTURE) IMPLANT
SUT VIC AB 3-0 SH 27 (SUTURE) ×4
SUT VIC AB 3-0 SH 27X BRD (SUTURE) IMPLANT
SWAB CULTURE AMIES ANAERIB BLU (MISCELLANEOUS) ×1 IMPLANT
WATER STERILE IRR 500ML POUR (IV SOLUTION) ×1 IMPLANT

## 2021-03-16 NOTE — Anesthesia Preprocedure Evaluation (Addendum)
Anesthesia Evaluation  Patient identified by MRN, date of birth, ID band Patient awake    Reviewed: Allergy & Precautions, NPO status , Patient's Chart, lab work & pertinent test results  History of Anesthesia Complications (+) PONV and history of anesthetic complications  Airway Mallampati: III  TM Distance: <3 FB Neck ROM: full    Dental  (+) Chipped, Poor Dentition   Pulmonary shortness of breath, sleep apnea , former smoker,    Pulmonary exam normal        Cardiovascular hypertension, (-) angina+ CAD  negative cardio ROS  + dysrhythmias Atrial Fibrillation      Neuro/Psych  Headaches, PSYCHIATRIC DISORDERS TIACVA    GI/Hepatic negative GI ROS, Neg liver ROS, GERD  ,(+) Hepatitis -  Endo/Other  Hypothyroidism   Renal/GU      Musculoskeletal   Abdominal   Peds  Hematology negative hematology ROS (+)   Anesthesia Other Findings Past Medical History: No date: Atrial fib/flutter, transient     Comment:  1 episode, resolved, never on coumadin 2019: Cataract     Comment:  left eye No date: Clotting disorder ([place])     Comment:  on xarelto 03/1997: Diverticulosis No date: Dyspnea No date: Dysrhythmia     Comment:  atrial fibrilation 1997: GERD (gastroesophageal reflux disease)     Comment:  good on prilosec No date: Hepatitis     Comment:  hx of hep B (late 1970s) 08/1996: Hyperlipidemia 1984: Hypothyroidism No date: Migraine     Comment:  w/o aura 11/2002: Osteopenia     Comment:  via dexa No date: Presence of dental prosthetic device     Comment:  temporary while waiting for implant No date: Skin cancer     Comment:  skin cancer No date: Sleep apnea 08/2011: Stroke ([place])     Comment:  TIA  Past Surgical History: No date: ABDOMINAL HYSTERECTOMY No date: ANAL FISSURE REPAIR 02/06/2018: CATARACT EXTRACTION W/PHACO; Right     Comment:  Procedure: CATARACT EXTRACTION PHACO AND INTRAOCULAR                LENS PLACEMENT ([place]) RIGHT;  Surgeon: Leandrew Koyanagi, MD;  Location: [place];  Service:               Ophthalmology;  Laterality: Right; 07/2006: CHOLECYSTECTOMY 12/05/2006: MOHS SURGERY     Comment:  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 Hulda Humphrey  BMI    Body Mass Index: 38.05 kg/m      Reproductive/Obstetrics negative OB ROS                            Anesthesia Physical Anesthesia Plan  ASA: 3  Anesthesia Plan: General ETT   Post-op Pain Management:    Induction: Intravenous  PONV Risk Score and Plan: Ondansetron, Dexamethasone, Midazolam, Treatment may vary due to age or medical condition and Aprepitant  Airway Management Planned: Oral ETT  Additional Equipment:   Intra-op Plan:   Post-operative Plan: Extubation in OR  Informed Consent: I have reviewed the patients History and Physical, chart, labs and discussed the procedure including the risks, benefits and alternatives for  the proposed anesthesia with the patient or authorized representative who has indicated his/her understanding and acceptance.     Dental Advisory Given  Plan Discussed with: Anesthesiologist, CRNA and Surgeon  Anesthesia Plan Comments: (Patient consented for risks of anesthesia including but not limited to:  - adverse reactions to medications - damage to eyes, teeth, lips or other oral mucosa - nerve damage due to positioning  - sore throat or hoarseness - Damage to heart, brain, nerves, lungs, other parts of body or loss of life  Patient voiced understanding.)       Anesthesia Quick Evaluation

## 2021-03-16 NOTE — Transfer of Care (Signed)
Immediate Anesthesia Transfer of Care Note  Patient: Kara Wright  Procedure(s) Performed: LYMPH NODE BIOPSY (Right: Neck)  Patient Location: PACU  Anesthesia Type:General  Level of Consciousness: awake, alert  and oriented  Airway & Oxygen Therapy: Patient Spontanous Breathing and Patient connected to face mask oxygen  Post-op Assessment: Report given to RN and Post -op Vital signs reviewed and stable  Post vital signs: Reviewed and stable  Last Vitals:  Vitals Value Taken Time  BP 135/59 03/16/21 1302  Temp    Pulse 61 03/16/21 1308  Resp 16 03/16/21 1308  SpO2 99 % 03/16/21 1308  Vitals shown include unvalidated device data.  Last Pain:  Vitals:   03/16/21 0939  TempSrc: Tympanic  PainSc: 0-No pain      Patients Stated Pain Goal: 0 (80/03/49 1791)  Complications: No notable events documented.

## 2021-03-16 NOTE — Interval H&P Note (Signed)
History and Physical Interval Note:  03/16/2021 9:49 AM  Kara Wright  has presented today for surgery, with the diagnosis of lymph node biopsy left neck.  The various methods of treatment have been discussed with the patient and family. After consideration of risks, benefits and other options for treatment, the patient has consented to  Procedure(s): LYMPH NODE BIOPSY (Left) as a surgical intervention.  The patient's history has been reviewed, patient examined, no change in status, stable for surgery.  I have reviewed the patient's chart and labs.  Questions were answered to the patient's satisfaction.     [name]

## 2021-03-16 NOTE — Progress Notes (Signed)
Charge Nurse, MR and this RN verified electronically and with obtaining a new order for consent for procedure.    Per Dr. Nino Parsley note 03/07/2021 "MRN : [id number] Kara Wright is a 75 y.o. ([birth year]) female who presents with chief complaint of check the lymph nodes on my neck. History of Present Illness:  Location: Base of right neck"  Patient verified with signature and marking of base of right neck by Dr. Delana Meyer prior to procedure confirmed.

## 2021-03-16 NOTE — Anesthesia Postprocedure Evaluation (Signed)
Anesthesia Post Note  Patient: Kara Wright  Procedure(s) Performed: LYMPH NODE BIOPSY (Right: Neck)  Patient location during evaluation: PACU Anesthesia Type: General Level of consciousness: awake and alert Pain management: pain level controlled Vital Signs Assessment: post-procedure vital signs reviewed and stable Respiratory status: spontaneous breathing, nonlabored ventilation, respiratory function stable and patient connected to nasal cannula oxygen Cardiovascular status: blood pressure returned to baseline and stable Postop Assessment: no apparent nausea or vomiting Anesthetic complications: no   No notable events documented.   Last Vitals:  Vitals:   03/16/21 1441 03/16/21 1443  BP: 126/60   Pulse: (!) 58   Resp: 18   Temp: (!) 36 C   SpO2: 93% 98%    Last Pain:  Vitals:   03/16/21 1441  TempSrc: Temporal  PainSc: 0-No pain                 Precious Haws Trinidad Petron

## 2021-03-16 NOTE — Op Note (Signed)
    OPERATIVE NOTE   PROCEDURE: Excisional lymph node biopsy right neck  PRE-OPERATIVE DIAGNOSIS: Diffuse lymphadenopathy of uncertain etiology  POST-OPERATIVE DIAGNOSIS: Same  SURGEON: [name]  ASSISTANT(S): None  ANESTHESIA: general  ESTIMATED BLOOD LOSS: 15 cc  FINDING(S): Diffuse lymph nodes supraclavicular/base of right neck  SPECIMEN(S): Lymphatic pad to pathology for permanent section  INDICATIONS:   Kara Wright is a 75 y.o. female who presents with diffuse lymphadenopathy with an increasing mass of lymph nodes at the base of her right neck.  Core needle biopsy did not yield a definitive diagnosis and formal lymph node biopsy is being undertaken.  Risks and benefits were reviewed all questions were answered patient agreed to proceed..  DESCRIPTION: After full informed written consent was obtained from the patient, the patient was brought back to the operating room and placed supine upon the operating table.  Prior to induction, the patient received IV antibiotics.   After obtaining adequate anesthesia, the patient was then prepped and draped in the standard fashion for a lymph node biopsy.  Curvilinear incision was made at the base of the right neck in line with the skin crease and carried down through the soft tissues.  The lymphatic pad was identified by palpation and a segment grasped with an Allis clamp.  The clump of lymph nodes was then dissected circumferentially using Bovie cautery.  Feeding veins were ligated between 3-0 silk ties.  The specimen was removed en bloc.  Several extra lymph nodes were removed as well.  Specimen was then passed off the field and placed in formalin.  The wound was then inspected for hemostasis as well as lymph leak.  Bovie cautery and 3-0 Vicryl were used to control any identified segments.  The wound was then irrigated and closed in 3 layers using 2 layers of 3-0 Vicryl and interrupted then continuous fashion followed by 4-0  Monocryl subcuticular for the skin.  Dermabond was applied.   The patient tolerated this procedure well.   COMPLICATIONS: None  CONDITION: [facility]  Office: [phone]   03/16/2021, 1:03 PM

## 2021-03-16 NOTE — Anesthesia Procedure Notes (Signed)
Procedure Name: Intubation Date/Time: 03/16/2021 11:42 AM Performed by: Rona Ravens, CRNA Pre-anesthesia Checklist: Patient identified, Patient being monitored, Timeout performed, Emergency Drugs available and Suction available Patient Re-evaluated:Patient Re-evaluated prior to induction Oxygen Delivery Method: Circle system utilized Preoxygenation: Pre-oxygenation with 100% oxygen Induction Type: IV induction Ventilation: Mask ventilation without difficulty Laryngoscope Size: McGraph and 4 Grade View: Grade II Tube type: Oral Tube size: 7.0 mm Number of attempts: 1 Airway Equipment and Method: Stylet Placement Confirmation: ETT inserted through vocal cords under direct vision, positive ETCO2 and breath sounds checked- equal and bilateral Secured at: 22 cm Tube secured with: Tape Dental Injury: Teeth and Oropharynx as per pre-operative assessment

## 2021-03-16 NOTE — Discharge Instructions (Addendum)
AMBULATORY SURGERY  ?DISCHARGE INSTRUCTIONS ? ? ?The drugs that you were given will stay in your system until tomorrow so for the next 24 hours you should not: ? ?Drive an automobile ?Make any legal decisions ?Drink any alcoholic beverage ? ? ?You may resume regular meals tomorrow.  Today it is better to start with liquids and gradually work up to solid foods. ? ?You may eat anything you prefer, but it is better to start with liquids, then soup and crackers, and gradually work up to solid foods. ? ? ?Please notify your doctor immediately if you have any unusual bleeding, trouble breathing, redness and pain at the surgery site, drainage, fever, or pain not relieved by medication. ? ? ? ?Additional Instructions: ? ? ? ?Please contact your physician with any problems or Same Day Surgery at [phone], Monday through Friday 6 am to 4 pm, or [facility] Main number at [phone].  ?

## 2021-03-16 NOTE — Progress Notes (Signed)
Per MD Schnier, pt can resume her xarelto tomorrow morning, (03/17/21). Continue to monitor.

## 2021-03-17 ENCOUNTER — Encounter: Payer: Self-pay | Admitting: Vascular Surgery

## 2021-03-17 LAB — BPAM RBC
Blood Product Expiration Date: 202210242359
Blood Product Expiration Date: 202210292359
Blood Product Expiration Date: 202210302359
ISSUE DATE / TIME: 202209281634
Unit Type and Rh: 5100
Unit Type and Rh: 6200
Unit Type and Rh: 6200

## 2021-03-17 LAB — TYPE AND SCREEN
ABO/RH(D): A POS
Antibody Screen: POSITIVE
Unit division: 0
Unit division: 0
Unit division: 0

## 2021-03-18 LAB — SURGICAL PATHOLOGY

## 2021-03-22 ENCOUNTER — Telehealth (INDEPENDENT_AMBULATORY_CARE_PROVIDER_SITE_OTHER): Payer: Self-pay

## 2021-03-22 ENCOUNTER — Telehealth: Payer: Self-pay

## 2021-03-22 DIAGNOSIS — Z20822 Contact with and (suspected) exposure to covid-19: Secondary | ICD-10-CM | POA: Diagnosis not present

## 2021-03-22 NOTE — Telephone Encounter (Signed)
I called the pt an made her aware of the NP's instructions she voiced understanding she would like to schedule her procedure F/U.

## 2021-03-22 NOTE — Chronic Care Management (AMB) (Addendum)
Chronic Care Management Pharmacy Assistant   Name: Shalin Linders  MRN: [id number] DOB: 12-02-1945  Kara Wright is an 75 y.o. year old female who presents for his initial CCM visit with the clinical pharmacist.  Reason for Encounter: Initial Questions   Conditions to be addressed/monitored: HTN and HLD   Recent office visits:  01/10/21-PCP-Patient presented for neck swelling.Possible lipoma will get MRI. No medication changes. 12/22/20-Family Medicine-Patient presented for a rash on bilateral upper and lower extremities.Stop taking Benadryl.Start Zyrtec 10 mg once nightly and famotidine (Pepcid) 20 mg once nightly for itching.  09/28/20-PCP-Patient presented for right arm pain,xrays,splint no medication changes.  Recent consult visits:  03/07/21-Vascular Surgery- Patient presented for new referral for lymphadenopathy. MR discussed, and biopsy planned,hold xarelto for 2 days. 01/07/21-Dermatology- no data found 12/14/20-Ophthalmology- no data found 10/21/20-Ophthalmology-no data found  09/23/20-Cardiology-Patient presented for follow up paroxysmal atrial fibrillation.Most recent electrolytes at goal with renal function stable and H&H stable,no medication changes follow up 1 year.  Hospital visits:  03/16/21-[facility]-Patient presented for lymph node biopsy-no admission  Medications: Outpatient Encounter Medications as of 03/22/2021  Medication Sig   albuterol (VENTOLIN HFA) 108 (90 Base) MCG/ACT inhaler Inhale 2 puffs into the lungs every 6 (six) hours as needed for wheezing or shortness of breath.   Cholecalciferol (VITAMIN D3) 50 MCG (2000 UT) capsule TAKE TWO CAPSULES BY MOUTH DAILY   clobetasol ointment (TEMOVATE) 0.05 % Apply topically 2 (two) times daily.   diltiazem (CARDIZEM) 30 MG tablet TAKE ONE TABLET BY MOUTH THREE TIMES DAILY AS NEEDED   diphenhydrAMINE (BENADRYL) 25 MG tablet Take 25 mg by mouth every 8 (eight) hours as needed for itching.   folic acid (FOLVITE) 1 MG tablet  Take 1 mg by mouth daily.   HYDROcodone-acetaminophen (NORCO) 5-325 MG tablet Take 1 tablet by mouth every 6 (six) hours as needed for moderate pain.   ibuprofen (ADVIL) 600 MG tablet Take 600 mg by mouth as needed.   levothyroxine (SYNTHROID) 150 MCG tablet 1 pill a day except for 2 pills on Sundays.   loperamide (IMODIUM A-D) 2 MG tablet Take 2 mg by mouth 2 (two) times daily as needed for diarrhea or loose stools.   methotrexate 2.5 MG tablet TAKE 5 TABLETS BY MOUTH ONCE A WEEK - TAKE FOLIC ACID ALL OTHER DAYS OF THE WEEK   omeprazole (PRILOSEC) 20 MG capsule TAKE ONE CAPSULE BY MOUTH DAILY   promethazine (PHENERGAN) 25 MG tablet TAKE ONE TABLET BY MOUTH EVERY 6 HOURS AS NEEDED FOR NAUSEA   propranolol ER (INDERAL LA) 120 MG 24 hr capsule TAKE ONE CAPSULE BY MOUTH AT BEDTIME   rosuvastatin (CRESTOR) 5 MG tablet Take 1 tablet (5 mg total) by mouth at bedtime.   traMADol (ULTRAM) 50 MG tablet Take 1-2 tablets (50-100 mg total) by mouth every 12 (twelve) hours as needed. CAUTION SEDATION   traMADol (ULTRAM) 50 MG tablet tramadol 50 mg tablet  Take 1 tablet every 6-8 hours by oral route as needed for pain for 5 days.   XARELTO 20 MG TABS tablet TAKE ONE TABLET BY MOUTH IN THE EVENING WITH SUPPER   No facility-administered encounter medications on file as of 03/22/2021.     Lab Results  Component Value Date/Time   HGBA1C 5.6 09/13/2020 09:45 AM   HGBA1C 5.5 08/13/2019 10:11 AM     BP Readings from Last 3 Encounters:  03/16/21 126/60  03/15/21 (!) 169/76  03/07/21 (!) 156/81    Patient contacted to review  initial questions prior to visit with [name].  Have you seen any other providers since your last visit with PCP? Yes 03/16/21-vascular surgery for neck biopsy  Any changes in your medications or health? Yes Right shoulder continues to be numb after recent biopsy  Any side effects from any medications? No  Do you have an symptoms or problems not managed by your medications?  No  Any concerns about your health right now? Yes The patient reports she was with her daughter over the weekend and on Sunday 03/20/21 the daughter called and reported positive for Covid-19, now the patient is searching for testing today.   Has your provider asked that you check blood pressure, blood sugar, or follow special diet at home? No  Do you get any type of exercise on a regular basis? Not any formal exercise at this time due to her husband has been in and out of rehabilitation and hospital stay for the last month.She has to travel to visit him each day.  Can you think of a goal you would like to reach for your health? No   Do you have any problems getting your medications? No Most of her maintenance medications are mail order from [facility].  Is there anything that you would like to discuss during the appointment? No   Shaheen Star was reminded to have all medications, supplements and any blood glucose and blood pressure readings available for review with [name], Pharm. D, at her telephone visit on 03/28/21 at 2:00pm.    Star Rating Drugs:  Medication:  Last Fill: Day Supply Rosuvastatin 5mg  03/08/21 30  Care Gaps: Annual wellness visit in last year? Yes  09/16/20 Most Recent BP reading:  156/81  56-P  03/07/21   [name], CPP notified  Avel Sensor, [place] Assistant [phone]  I have reviewed the care management and care coordination activities outlined in this encounter and I am certifying that I agree with the content of this note. No further action required.  [name], PharmD Clinical Pharmacist [facility] [phone]

## 2021-03-22 NOTE — Telephone Encounter (Signed)
Called and scheduled patient

## 2021-03-22 NOTE — Telephone Encounter (Signed)
Yes it can linger as sometimes nerves are severed during the biopsy

## 2021-03-22 NOTE — Telephone Encounter (Signed)
Pt called and left a VM on the nurses line saying she had a procedure last WED with Dr. Delana Meyer  and still has no feeling in her shoulder she would like to know is this normal  pt had a lymph node Bx . Please advise.

## 2021-03-28 ENCOUNTER — Telehealth: Payer: Self-pay

## 2021-03-28 ENCOUNTER — Telehealth: Payer: PPO

## 2021-03-28 NOTE — Progress Notes (Deleted)
Chronic Care Management Pharmacy Note  03/28/2021 Name:  Kara Wright MRN:  [id number] DOB:  [birth year]  Summary: ***  Recommendations/Changes made from today's visit: ***  Plan: ***   Subjective: Kara Wright is an 75 y.o. year old female who is a primary patient of Damita Dunnings, Elveria Rising, MD.  The CCM team was consulted for assistance with disease management and care coordination needs.    Engaged with patient by telephone for initial visit in response to provider referral for pharmacy case management and/or care coordination services.   Consent to Services:  The patient was given the following information about Chronic Care Management services today, agreed to services, and gave verbal consent: 1. CCM service includes personalized support from designated clinical staff supervised by the primary care provider, including individualized plan of care and coordination with other care providers 2. 24/7 contact phone numbers for assistance for urgent and routine care needs. 3. Service will only be billed when office clinical staff spend 20 minutes or more in a month to coordinate care. 4. Only one practitioner may furnish and bill the service in a calendar month. 5.The patient may stop CCM services at any time (effective at the end of the month) by phone call to the office staff. 6. The patient will be responsible for cost sharing (co-pay) of up to 20% of the service fee (after annual deductible is met). Patient agreed to services and consent obtained.  Patient Care Team: Tonia Ghent, MD as PCP - General (Family Medicine) Rockey Situ Kathlene November, MD as PCP - Cardiology (Cardiology) Dasher, Rayvon Char, MD as Consulting Physician (Dermatology) Minna Merritts, MD as Consulting Physician (Cardiology) Ocie Doyne, OD as Consulting Physician (Optometry) Dale [place], DDS as Consulting Physician (Dentistry) [name], [facility] as Pharmacist (Pharmacist)  Consent 02/02/21  Recent office  visits: 01/10/21-PCP-Patient presented for neck swelling.Possible lipoma will get MRI. No medication changes. 12/22/20-Family Medicine-Patient presented for a rash on bilateral upper and lower extremities.Stop taking Benadryl.Start Zyrtec 10 mg once nightly and famotidine (Pepcid) 20 mg once nightly for itching.  09/28/20-PCP-Patient presented for right arm pain,xrays,splint no medication changes.   Recent consult visits:  03/07/21-Vascular Surgery- Patient presented for new referral for lymphadenopathy. MR discussed, and biopsy planned,hold xarelto for 2 days. 01/07/21-Dermatology- no data found 12/14/20-Ophthalmology- no data found 10/21/20-Ophthalmology-no data found  09/23/20-Cardiology-Patient presented for follow up paroxysmal atrial fibrillation.Most recent electrolytes at goal with renal function stable and H&H stable,no medication changes follow up 1 year.   Hospital visits:  03/16/21-[facility]-Patient presented for lymph node biopsy-no admission  Objective:  Lab Results  Component Value Date   CREATININE 0.79 03/15/2021   BUN 13 03/15/2021   GFR 87.91 09/13/2020   GFRNONAA >60 03/15/2021   GFRAA >60 10/28/2016   NA 137 03/15/2021   K 3.6 03/15/2021   CALCIUM 8.8 (L) 03/15/2021   CO2 30 03/15/2021   GLUCOSE 109 (H) 03/15/2021    Lab Results  Component Value Date/Time   HGBA1C 5.6 09/13/2020 09:45 AM   HGBA1C 5.5 08/13/2019 10:11 AM   GFR 87.91 09/13/2020 09:45 AM   GFR 89.34 08/13/2019 10:11 AM    Last diabetic Eye exam: No results found for: HMDIABEYEEXA  Last diabetic Foot exam: No results found for: HMDIABFOOTEX   Lab Results  Component Value Date   CHOL 110 09/13/2020   HDL 34.70 (L) 09/13/2020   LDLCALC 45 09/13/2020   LDLDIRECT 94.9 06/15/2011   TRIG 154.0 (H) 09/13/2020   CHOLHDL 3 09/13/2020  Hepatic Function Latest Ref Rng & Units 08/09/2020 08/13/2019 08/08/2018  Total Protein 6.5 - 8.1 g/dL 7.5 6.1 6.9  Albumin 3.5 - 5.0 g/dL 4.1 3.7 4.0  AST 15 - 41 U/L _0 ALT 0 - 44 U/L _1 Alk Phosphatase 38 - 126 U/L 82 68 69  Total Bilirubin 0.3 - 1.2 mg/dL 1.0 0.7 0.7  Bilirubin, Direct 0.1 - 0.5 mg/dL - - -    Lab Results  Component Value Date/Time   TSH 5.50 (H) 09/13/2020 09:45 AM   TSH 1.60 08/13/2019 10:11 AM   FREET4 0.70 06/07/2010 09:03 AM   FREET4 1.2 05/17/2009 12:00 AM    CBC Latest Ref Rng & Units 03/15/2021 09/30/2020 08/09/2020  WBC 4.0 - 10.5 K/uL 6.8 6.6 7.7  Hemoglobin 12.0 - 15.0 g/dL 11.3(L) 12.2 13.9  Hematocrit 36.0 - 46.0 % 35.5(L) 38.3 43.4  Platelets 150 - 400 K/uL 171 172 210    Lab Results  Component Value Date/Time   VD25OH 36.15 09/13/2020 09:45 AM   VD25OH 32.68 08/13/2019 10:11 AM    Clinical ASCVD: {YES/NO:21197} The ASCVD Risk score (Arnett DK, et al., 2019) failed to calculate for the following reasons:   The valid total cholesterol range is 130 to 320 mg/dL    Depression screen [facility] 2/9 01/10/2021 08/21/2019 08/08/2018  Decreased Interest 0 0 0  Down, Depressed, Hopeless 0 0 0  PHQ - 2 Score 0 0 0  Altered sleeping - 0 0  Tired, decreased energy - 0 0  Change in appetite - 0 0  Feeling bad or failure about yourself  - 0 0  Trouble concentrating - 0 0  Moving slowly or fidgety/restless - 0 0  Suicidal thoughts - 0 0  PHQ-9 Score - 0 0  Difficult doing work/chores - Not difficult at all Not difficult at all  Some recent data might be hidden     ***Other: (CHADS2VASc if Afib, MMRC or CAT for COPD, ACT, DEXA)  Social History   Tobacco Use  Smoking Status Former   Packs/day: 1.00   Types: Cigarettes   Quit date: 06/26/1986   Years since quitting: 34.7  Smokeless Tobacco Never   BP Readings from Last 3 Encounters:  03/16/21 126/60  03/15/21 (!) 169/76  03/07/21 (!) 156/81   Pulse Readings from Last 3 Encounters:  03/16/21 (!) 53  03/15/21 60  03/07/21 (!) 56   Wt Readings from Last 3 Encounters:  03/16/21 224 lb 6.9 oz (101.8 kg)  03/15/21 224 lb 8 oz (101.8 kg)  03/07/21 222 lb  (100.7 kg)   BMI Readings from Last 3 Encounters:  03/16/21 38.05 kg/m  03/15/21 38.06 kg/m  03/07/21 40.60 kg/m    Assessment/Interventions: Review of patient past medical history, allergies, medications, health status, including review of consultants reports, laboratory and other test data, was performed as part of comprehensive evaluation and provision of chronic care management services.   SDOH:  (Social Determinants of Health) assessments and interventions performed: {yes/no:20286}  SDOH Screenings   Alcohol Screen: Not on file  Depression (PHQ2-9): Low Risk    PHQ-2 Score: 0  Financial Resource Strain: Not on file  Food Insecurity: Not on file  Housing: Not on file  Physical Activity: Not on file  Social Connections: Not on file  Stress: Not on file  Tobacco Use: Medium Risk   Smoking Tobacco Use: Former   Smokeless Tobacco Use: Never  Transportation Needs: Not on file  CCM Care Plan  Allergies  Allergen Reactions   Celebrex [Celecoxib]     REACTION: nausea: GI UPSET   Doxycycline Other (See Comments)    GI upset- not an allergy.     Gold-Containing Drug Products    Nickel     Skin rash, allergy   Other     Fragrance, phenylenediamine, contrast metal agents, gold   Sodium Metabisulfite Other (See Comments)    Positive patch test   Zocor [Simvastatin]     Aches but tolerates crestor    Medications Reviewed Today     Reviewed by Loann Quill, CPhT (Pharmacy Technician) on 03/16/21 at 1328  Med List Status: Complete   Medication Order Taking? Sig Documenting Provider Last Dose Status Informant  albuterol (VENTOLIN HFA) 108 (90 Base) MCG/ACT inhaler [id number] Yes Inhale 2 puffs into the lungs every 6 (six) hours as needed for wheezing or shortness of breath. Garner Nash, DO 03/16/2021 Active   Cholecalciferol (VITAMIN D3) 50 MCG (2000 UT) capsule [id number] Yes TAKE TWO CAPSULES BY MOUTH DAILY Tonia Ghent, MD 03/15/2021 Active   clobetasol  ointment (TEMOVATE) 0.05 % [id number] Yes Apply topically 2 (two) times daily. [provider] Past Week Active   diltiazem (CARDIZEM) 30 MG tablet [id number] Yes TAKE ONE TABLET BY MOUTH THREE TIMES DAILY AS NEEDED Tonia Ghent, MD 03/15/2021 Active   diphenhydrAMINE (BENADRYL) 25 MG tablet [id number] Yes Take 25 mg by mouth every 8 (eight) hours as needed for itching. [provider] Past Month Active   folic acid (FOLVITE) 1 MG tablet [id number] No Take 1 mg by mouth daily. [provider] 03/14/2021 Active   ibuprofen (ADVIL) 600 MG tablet [id number] Yes Take 600 mg by mouth as needed. [provider] Past Week Active   levothyroxine (SYNTHROID) 150 MCG tablet [id number] Yes 1 pill a day except for 2 pills on Sundays. Tonia Ghent, MD 03/16/2021 Active   loperamide (IMODIUM A-D) 2 MG tablet [id number] Yes Take 2 mg by mouth 2 (two) times daily as needed for diarrhea or loose stools. [provider] Past Month Active   methotrexate 2.5 MG tablet [id number] Yes TAKE 5 TABLETS BY MOUTH ONCE A WEEK - TAKE FOLIC ACID ALL OTHER DAYS OF THE WEEK [provider] 03/15/2021 Active   omeprazole (PRILOSEC) 20 MG capsule [id number] Yes TAKE ONE CAPSULE BY MOUTH DAILY Tonia Ghent, MD 03/16/2021 Active   promethazine (PHENERGAN) 25 MG tablet [id number] No TAKE ONE TABLET BY MOUTH EVERY 6 HOURS AS NEEDED FOR NAUSEA Tonia Ghent, MD 03/13/2021 Active   propranolol ER (INDERAL LA) 120 MG 24 hr capsule [id number] Yes TAKE ONE CAPSULE BY MOUTH AT BEDTIME Tonia Ghent, MD 03/15/2021 Active   rosuvastatin (CRESTOR) 5 MG tablet [id number] Yes Take 1 tablet (5 mg total) by mouth at bedtime. Tonia Ghent, MD 03/15/2021 Active   traMADol (ULTRAM) 50 MG tablet [id number] No Take 1-2 tablets (50-100 mg total) by mouth every 12 (twelve) hours as needed. CAUTION SEDATION Tonia Ghent, MD 03/14/2021 Active   traMADol (ULTRAM) 50 MG tablet [id number] No tramadol 50 mg  tablet  Take 1 tablet every 6-8 hours by oral route as needed for pain for 5 days. [provider] 03/14/2021 Active   XARELTO 20 MG TABS tablet [id number] No TAKE ONE TABLET BY MOUTH IN THE EVENING WITH SUPPER Tonia Ghent, MD 03/14/2021 Active             Patient Active Problem  List   Diagnosis Date Noted   Neck mass 01/12/2021   Right wrist pain 09/29/2020   Lymphadenopathy 09/29/2020   Hearing loss 09/28/2020   Chest wall pain 09/19/2020   Sleep apnea 01/19/2020   Lung nodule 01/19/2020   SUI (stress urinary incontinence, female) 08/24/2019   Itching 08/24/2019   SOB (shortness of breath) 08/24/2019   Exposure to 2019 novel coronavirus 09/18/2018   Cough 10/28/2017   Health care maintenance 08/13/2017   Vitamin D deficiency 04/10/2017   Hip pain 04/10/2017   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/01/2016   Low back pain 08/04/2016   Dysuria 09/02/2015   Morbid obesity ([place]) 04/05/2015   Advance care planning 08/07/2014   Anxiety 07/16/2012   Esophagitis 04/17/2012   TIA (transient ischemic attack) 08/27/2011   Medicare annual wellness visit, subsequent 06/25/2011   Atrial fibrillation ([place]) 10/31/2010   HTN (hypertension) 10/31/2010   VARICOSE VEINS, LOWER EXTREMITIES 08/10/2008   Hypothyroidism 12/15/2006   HLD (hyperlipidemia) 12/15/2006   MIGRAINE HEADACHE 12/15/2006   GERD (gastroesophageal reflux disease) 12/15/2006   DIVERTICULOSIS, COLON 12/15/2006   Osteoporosis 12/15/2006    Immunization History  Administered Date(s) Administered   Fluad Quad(high Dose 65+) 03/21/2019   Influenza Split 04/13/2011, 04/26/2012   Influenza Whole 04/07/2009, 04/13/2010   Influenza, High Dose Seasonal PF 04/09/2017, 04/18/2018   Influenza,inj,Quad PF,6+ Mos 05/01/2013, 04/30/2014, 04/05/2015, 06/05/2016   Influenza-Unspecified 04/10/2020   PFIZER(Purple Top)SARS-COV-2 Vaccination 08/06/2019, 08/27/2019, 08/03/2020    Pneumococcal Conjugate-13 08/06/2014   Pneumococcal Polysaccharide-23 07/16/2012   Td 04/04/2006, 02/20/2012   Zoster Recombinat (Shingrix) 03/18/2020, 08/03/2020   Zoster, Live 05/02/2007    Conditions to be addressed/monitored:  Hypertension, Hyperlipidemia, and Atrial Fibrillation  There are no care plans that you recently modified to display for this patient.   Current Barriers:  {pharmacybarriers:24917}  Pharmacist Clinical Goal(s):  Patient will {PHARMACYGOALCHOICES:24921} through collaboration with PharmD and provider.   Interventions: 1:1 collaboration with Tonia Ghent, MD regarding development and update of comprehensive plan of care as evidenced by provider attestation and co-signature Inter-disciplinary care team collaboration (see longitudinal plan of care) Comprehensive medication review performed; medication list updated in electronic medical record  Hyperlipidemia: (LDL goal < 70) -Controlled - LDL 45 -Current treatment: Rosuvastatin 5 mg - 1 tablet daily  -Medications previously tried: ***  -Current dietary patterns: *** -Current exercise habits: *** -Educated on {CCM HLD Counseling:25126} -{CCMPHARMDINTERVENTION:25122}  Atrial Fibrillation (Goal: prevent stroke and major bleeding) -{US controlled/uncontrolled:25276} -CHADSVASC: *** -Current treatment: Rate control: Propranolol ER 120 mg - 1 capsule daily at bedtime  Rhythm control: Diltiazem 30 mg - 1 tablet three times daily as needed  Anticoagulation: Xarelto 20 mg - 1 tablet daily -Medications previously tried: *** -Home BP and HR readings: ***  -Counseled on {CCMAFIBCOUNSELING:25120} -{CCMPHARMDINTERVENTION:25122}  Osteoporosis / Osteopenia (Goal ***) -{US controlled/uncontrolled:25276} -Last DEXA Scan: ***   T-Score femoral neck: ***  T-Score total hip: ***  T-Score lumbar spine: ***  T-Score forearm radius: ***  10-year probability of major osteoporotic fracture: ***  10-year  probability of hip fracture: *** -Patient {is;is not an osteoporosis candidate:23886} -Current treatment  Vitamin D3 2000 IU - 1 capsule daily -Medications previously tried: ***  -{Osteoporosis Counseling:23892} -{CCMPHARMDINTERVENTION:25122}  Hypothyroidism (Goal: ***) -{US controlled/uncontrolled:25276} -Current treatment  Levothyroxine 150 mcg - 1 tablet daily -Medications previously tried: ***  -{CCMPHARMDINTERVENTION:25122}  GERD (Goal: ***) -{US controlled/uncontrolled:25276} -Current treatment  Omeprazole 20 mg - 1 capsule daily -Medications previously tried: ***  -{CCMPHARMDINTERVENTION:25122}  *** (  Goal: ***) -{US controlled/uncontrolled:25276} -Current treatment  *** -Medications previously tried: ***  -{CCMPHARMDINTERVENTION:25122}  Patient Goals/Self-Care Activities Patient will:  - {pharmacypatientgoals:24919}  Follow Up Plan: {CM FOLLOW UP XYBF:38329}   Medication Assistance: {MEDASSISTANCEINFO:25044}  Compliance/Adherence/Medication fill history: Care Gaps: ***  Star-Rating Drugs: Medication:                Last Fill:         Day Supply Rosuvastatin 36m       03/08/21            30    [facility] [address] [address]Phone: [phone]: [phone]  Uses pill box? {Yes or If no, why not?:20788} Pt endorses ***% compliance  We discussed: {Pharmacy options:24294} Patient decided to: {US Pharmacy PEBXI:35686} Care Plan and Follow Up Patient Decision:  {FOLLOWUP:24991}  Plan: {CM FOLLOW UP PHUOH:72902} MDebbora Dus PharmD Clinical Pharmacist [phone]

## 2021-03-28 NOTE — Telephone Encounter (Signed)
  Chronic Care Management   Outreach Note  03/28/2021 Name: Rozlynn Lippold MRN: [id number] DOB: [birth year]  Referred by: Tonia Ghent, MD Reason for referral : Missed CCM visit   An unsuccessful telephone outreach was attempted today. The patient was referred to the pharmacist for assistance with care management and care coordination.   [name], PharmD Clinical Pharmacist [facility] [phone]

## 2021-03-29 ENCOUNTER — Other Ambulatory Visit: Payer: Self-pay | Admitting: Family Medicine

## 2021-03-29 DIAGNOSIS — R591 Generalized enlarged lymph nodes: Secondary | ICD-10-CM

## 2021-03-31 ENCOUNTER — Other Ambulatory Visit: Payer: Self-pay

## 2021-03-31 ENCOUNTER — Ambulatory Visit (INDEPENDENT_AMBULATORY_CARE_PROVIDER_SITE_OTHER): Payer: PPO | Admitting: Vascular Surgery

## 2021-03-31 VITALS — BP 159/79 | HR 79 | Ht 64.0 in | Wt 223.0 lb

## 2021-03-31 DIAGNOSIS — R591 Generalized enlarged lymph nodes: Secondary | ICD-10-CM

## 2021-04-05 ENCOUNTER — Encounter (INDEPENDENT_AMBULATORY_CARE_PROVIDER_SITE_OTHER): Payer: Self-pay | Admitting: Vascular Surgery

## 2021-04-05 NOTE — Progress Notes (Signed)
Patient ID: Kara Wright, female   DOB: 21-Mar-1946, 75 y.o.   MRN: [id number]  Chief Complaint  Patient presents with   Follow-up    2 wk [facility] post Lymph node biopsy no studies     HPI Kara Wright is a 75 y.o. female.    No c/o no neck pain still has some numbness of the surgical site   Past Medical History:  Diagnosis Date   Atrial fib/flutter, transient    1 episode, resolved, never on coumadin   Cataract 2019   left eye   Clotting disorder ([place])    on xarelto   Diverticulosis 03/1997   Dyspnea    Dysrhythmia    atrial fibrilation   GERD (gastroesophageal reflux disease) 1997   good on prilosec   Hepatitis    hx of hep B (late 1970s)   Hyperlipidemia 08/1996   Hypothyroidism 1984   Migraine    w/o aura   Osteopenia 11/2002   via dexa   Presence of dental prosthetic device    temporary while waiting for implant   Skin cancer    skin cancer   Sleep apnea    Stroke ([place]) 08/2011   TIA    Past Surgical History:  Procedure Laterality Date   ABDOMINAL HYSTERECTOMY     ANAL FISSURE REPAIR     CATARACT EXTRACTION W/PHACO Right 02/06/2018   Procedure: CATARACT EXTRACTION PHACO AND INTRAOCULAR LENS PLACEMENT ([place]) RIGHT;  Surgeon: Leandrew Koyanagi, MD;  Location: [place];  Service: Ophthalmology;  Laterality: Right;   CHOLECYSTECTOMY  07/2006   LYMPH NODE BIOPSY Right 03/16/2021   Procedure: LYMPH NODE BIOPSY;  Surgeon: Katha Cabal, MD;  Location: [facility];  Service: Vascular;  Laterality: Right;   MOHS SURGERY  12/05/2006   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 Hulda Humphrey      Allergies  Allergen Reactions   Celebrex [Celecoxib]     REACTION: nausea: GI UPSET   Doxycycline Other (See Comments)    GI upset- not an allergy.      Gold-Containing Drug Products    Nickel     Skin rash, allergy   Other     Fragrance, phenylenediamine, contrast metal agents, gold   Sodium Metabisulfite Other (See Comments)    Positive patch test   Zocor [Simvastatin]     Aches but tolerates crestor    Current Outpatient Medications  Medication Sig Dispense Refill   albuterol (VENTOLIN HFA) 108 (90 Base) MCG/ACT inhaler Inhale 2 puffs into the lungs every 6 (six) hours as needed for wheezing or shortness of breath. 8 g 3   Cholecalciferol (VITAMIN D3) 50 MCG (2000 UT) capsule TAKE TWO CAPSULES BY MOUTH DAILY 180 capsule 3   clobetasol ointment (TEMOVATE) 0.05 % Apply topically 2 (two) times daily.     diltiazem (CARDIZEM) 30 MG tablet TAKE ONE TABLET BY MOUTH THREE TIMES DAILY AS NEEDED 90 tablet 1   diphenhydrAMINE (BENADRYL) 25 MG tablet Take 25 mg by mouth every 8 (eight) hours as needed for itching.     folic acid (FOLVITE) 1 MG tablet Take 1 mg by mouth daily.     HYDROcodone-acetaminophen (NORCO) 5-325 MG tablet Take 1 tablet by mouth every 6 (six) hours  as needed for moderate pain. 30 tablet 0   ibuprofen (ADVIL) 600 MG tablet Take 600 mg by mouth as needed.     levothyroxine (SYNTHROID) 150 MCG tablet 1 pill a day except for 2 pills on Sundays. 110 tablet 3   loperamide (IMODIUM A-D) 2 MG tablet Take 2 mg by mouth 2 (two) times daily as needed for diarrhea or loose stools.     methotrexate 2.5 MG tablet TAKE 5 TABLETS BY MOUTH ONCE A WEEK - TAKE FOLIC ACID ALL OTHER DAYS OF THE WEEK     omeprazole (PRILOSEC) 20 MG capsule TAKE ONE CAPSULE BY MOUTH DAILY 90 capsule 3   promethazine (PHENERGAN) 25 MG tablet TAKE ONE TABLET BY MOUTH EVERY 6 HOURS AS NEEDED FOR NAUSEA 30 tablet 3   propranolol ER (INDERAL LA) 120 MG 24 hr capsule TAKE ONE CAPSULE BY MOUTH AT BEDTIME 90 capsule 3   rosuvastatin (CRESTOR) 5 MG tablet Take 1 tablet (5 mg total) by mouth at bedtime. 90 tablet 3   traMADol (ULTRAM) 50 MG tablet Take 1-2 tablets (50-100  mg total) by mouth every 12 (twelve) hours as needed. CAUTION SEDATION 50 tablet 1   traMADol (ULTRAM) 50 MG tablet tramadol 50 mg tablet  Take 1 tablet every 6-8 hours by oral route as needed for pain for 5 days.     XARELTO 20 MG TABS tablet TAKE ONE TABLET BY MOUTH IN THE EVENING WITH SUPPER 90 tablet 3   No current facility-administered medications for this visit.        Physical Exam BP (!) 159/79   Pulse 79   Ht 5\' 4"  (1.626 m)   Wt 223 lb (101.2 kg)   BMI 38.28 kg/m  Gen:  WD/WN, NAD Skin: incision C/D/I, mild seroma     Assessment/Plan: 1. Lymphadenopathy Doing well post op Pathology is reviewed and she has already been set up with Heme/onc  Follow up in about two months to check for regression of the seroma      [name] 04/05/2021, 2:02 PM   This note was created with Dragon medical transcription system.  Any errors from dictation are unintentional.

## 2021-04-07 ENCOUNTER — Inpatient Hospital Stay: Payer: PPO

## 2021-04-07 ENCOUNTER — Encounter: Payer: Self-pay | Admitting: Internal Medicine

## 2021-04-07 ENCOUNTER — Other Ambulatory Visit: Payer: Self-pay

## 2021-04-07 ENCOUNTER — Inpatient Hospital Stay: Payer: PPO | Attending: Internal Medicine | Admitting: Internal Medicine

## 2021-04-07 VITALS — BP 157/73 | HR 68 | Temp 98.3°F | Resp 18 | Wt 220.0 lb

## 2021-04-07 DIAGNOSIS — R590 Localized enlarged lymph nodes: Secondary | ICD-10-CM | POA: Insufficient documentation

## 2021-04-07 DIAGNOSIS — R591 Generalized enlarged lymph nodes: Secondary | ICD-10-CM

## 2021-04-07 LAB — LACTATE DEHYDROGENASE: LDH: 167 U/L (ref 98–192)

## 2021-04-07 LAB — CBC WITH DIFFERENTIAL/PLATELET
Abs Immature Granulocytes: 0.02 10*3/uL (ref 0.00–0.07)
Basophils Absolute: 0.1 10*3/uL (ref 0.0–0.1)
Basophils Relative: 1 %
Eosinophils Absolute: 0.2 10*3/uL (ref 0.0–0.5)
Eosinophils Relative: 4 %
HCT: 36 % (ref 36.0–46.0)
Hemoglobin: 11.3 g/dL — ABNORMAL LOW (ref 12.0–15.0)
Immature Granulocytes: 0 %
Lymphocytes Relative: 30 %
Lymphs Abs: 1.9 10*3/uL (ref 0.7–4.0)
MCH: 25.5 pg — ABNORMAL LOW (ref 26.0–34.0)
MCHC: 31.4 g/dL (ref 30.0–36.0)
MCV: 81.3 fL (ref 80.0–100.0)
Monocytes Absolute: 0.5 10*3/uL (ref 0.1–1.0)
Monocytes Relative: 8 %
Neutro Abs: 3.5 10*3/uL (ref 1.7–7.7)
Neutrophils Relative %: 57 %
Platelets: 170 10*3/uL (ref 150–400)
RBC: 4.43 MIL/uL (ref 3.87–5.11)
RDW: 16.3 % — ABNORMAL HIGH (ref 11.5–15.5)
WBC: 6.1 10*3/uL (ref 4.0–10.5)
nRBC: 0 % (ref 0.0–0.2)

## 2021-04-07 LAB — COMPREHENSIVE METABOLIC PANEL
ALT: 13 U/L (ref 0–44)
AST: 17 U/L (ref 15–41)
Albumin: 3.8 g/dL (ref 3.5–5.0)
Alkaline Phosphatase: 73 U/L (ref 38–126)
Anion gap: 7 (ref 5–15)
BUN: 11 mg/dL (ref 8–23)
CO2: 31 mmol/L (ref 22–32)
Calcium: 8.8 mg/dL — ABNORMAL LOW (ref 8.9–10.3)
Chloride: 101 mmol/L (ref 98–111)
Creatinine, Ser: 0.68 mg/dL (ref 0.44–1.00)
GFR, Estimated: >60 mL/min (ref >60)
Glucose, Bld: 118 mg/dL — ABNORMAL HIGH (ref 70–99)
Potassium: 3.7 mmol/L (ref 3.5–5.1)
Sodium: 139 mmol/L (ref 135–145)
Total Bilirubin: 1 mg/dL (ref 0.3–1.2)
Total Protein: 6.8 g/dL (ref 6.5–8.1)

## 2021-04-07 NOTE — Progress Notes (Signed)
Patient here for oncology follow-up appointment, concerns of nausea and diarrhea

## 2021-04-07 NOTE — Progress Notes (Signed)
[place] NOTE  Patient Care Team: Tonia Ghent, MD as PCP - General (Family Medicine) Rockey Situ Kathlene November, MD as PCP - Cardiology (Cardiology) Dasher, Rayvon Char, MD as Consulting Physician (Dermatology) Minna Merritts, MD as Consulting Physician (Cardiology) Ocie Doyne, OD as Consulting Physician (Optometry) Dale Denver, DDS as Consulting Physician (Dentistry) [name], [facility] as Pharmacist (Pharmacist)  CHIEF COMPLAINTS/PURPOSE OF CONSULTATION: Generalized lymphadenopathy  IMPRESSION: 1. There are fewer waxing and waning bilateral lung nodules, as described above. The majority of the multiple nodules seen at both lung bases on 01/05/2020 are no longer present. There is 1 residual nodule in the left upper lobe measuring 1.0 cm in maximum diameter. There is an interval 6 mm maximum diameter nodule in the medial left lower lobe. There is also a 1.0 cm nodule in the left lower lobe at the left lung base. A follow-up chest CT in 6 months is recommended. 2. Mild right superior mediastinal adenopathy, slightly less prominent. 3. Stable enlarged central pulmonary arteries, compatible with chronic pulmonary arterial hypertension. 4. Calcific coronary artery and aortic atherosclerosis   DIAGNOSIS:  A. 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  - NEGATIVE FOR GRANULOMATOUS INFLAMMATION AND MALIGNANCY (0/11).  - CONGO RED STAIN IS NEGATIVE; STAIN CONTROL WORKED APPROPRIATELY   DIAGNOSIS:  A. LYMPH NODE, LEFT RETROPERITONEAL; CT-GUIDED CORE NEEDLE BIOPSY:  - FRAGMENTS OF BENIGN LYMPH NODE WITH ASSOCIATED EPITHELIOID  NONNECROTIZING GRANULOMATOUS INFLAMMATION.  IMPRESSION: Chronic lymphadenopathy in the right lower neck with infiltrative changes in the surrounding fat. This has been present since at least 2012 but demonstrates slow progression over time, and there  are associated margins and mass effect. The overall appearance is unusual and not typical of lymphadenopathy related to sarcoidosis (and the patient's retroperitoneal lymphadenopathy does not demonstrate similar surrounding infiltrative changes). This is not felt to reflect a venolymphatic malformation given involvement of the right axilla. Given the atypical appearance and slow progression over time, consider biopsy of either the neck or axilla for further evaluation.     Electronically Signed   By: Logan Bores M.D.   On: 02/16/2021 11:59  HISTORY OF PRESENTING ILLNESS: Patient is alone.  Ambulating independently. Kara Wright 75 y.o.  female has been referred to [place] for further evaluation of generalized lymphadenopathy.  Patient states that she has been "ill" for many months.  And she has seen multiple physicians in the last many months-for complaints of ongoing fatigue weight loss.  She also complains of cough and shortness of breath.  Patient had imaging done of the chest that showed waxing and waning lung nodules.  Patient subsequently underwent retroperitoneal lymph node biopsy which shows benign nonnecrotizing granulomatous inflammation.  Patient also had right supraclavicular lymph node excisional biopsy that shows suggestive of vascular transformation of the sinuses.  Negative for any malignancy.  Patient has chronic nausea chronic diarrhea.   Review of Systems  Constitutional:  Positive for malaise/fatigue and weight loss. Negative for chills, diaphoresis and fever.  HENT:  Negative for nosebleeds and sore throat.   Eyes:  Negative for double vision.  Respiratory:  Positive for cough and shortness of breath. Negative for hemoptysis, sputum production and wheezing.   Cardiovascular:  Negative for chest pain, palpitations, orthopnea and leg swelling.  Gastrointestinal:  Positive for diarrhea and nausea. Negative for abdominal pain, blood in stool, constipation, heartburn,  melena and vomiting.  Genitourinary:  Negative for dysuria, frequency and  urgency.  Musculoskeletal:  Positive for back pain, joint pain and myalgias.  Skin: Negative.  Negative for itching and rash.  Neurological:  Negative for dizziness, tingling, focal weakness, weakness and headaches.  Endo/Heme/Allergies:  Does not bruise/bleed easily.  Psychiatric/Behavioral:  Negative for depression. The patient is not nervous/anxious and does not have insomnia.     MEDICAL HISTORY:  Past Medical History:  Diagnosis Date   Atrial fib/flutter, transient    1 episode, resolved, never on coumadin   Cataract 2019   left eye   Clotting disorder ([place])    on xarelto   Diverticulosis 03/1997   Dyspnea    Dysrhythmia    atrial fibrilation   GERD (gastroesophageal reflux disease) 1997   good on prilosec   Hepatitis    hx of hep B (late 1970s)   Hyperlipidemia 08/1996   Hypothyroidism 1984   Migraine    w/o aura   Osteopenia 11/2002   via dexa   Presence of dental prosthetic device    temporary while waiting for implant   Skin cancer    skin cancer   Sleep apnea    Stroke ([place]) 08/2011   TIA    SURGICAL HISTORY: Past Surgical History:  Procedure Laterality Date   ABDOMINAL HYSTERECTOMY     ANAL FISSURE REPAIR     CATARACT EXTRACTION W/PHACO Right 02/06/2018   Procedure: CATARACT EXTRACTION PHACO AND INTRAOCULAR LENS PLACEMENT ([place]) RIGHT;  Surgeon: Leandrew Koyanagi, MD;  Location: [place];  Service: Ophthalmology;  Laterality: Right;   CHOLECYSTECTOMY  07/2006   LYMPH NODE BIOPSY Right 03/16/2021   Procedure: LYMPH NODE BIOPSY;  Surgeon: Katha Cabal, MD;  Location: [facility];  Service: Vascular;  Laterality: Right;   MOHS SURGERY  12/05/2006   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 Hulda Humphrey    SOCIAL HISTORY: Social History   Socioeconomic History   Marital status: Married    Spouse name: Not on file   Number of children: 2   Years of education: Not on file   Highest education level: Not on file  Occupational History   Occupation: Ecologist: RETIRED    Comment: 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     Married 1979   2 daughters from prev relationship   [employer] fan, [employer] fan.   Social Determinants of Health   Financial Resource Strain: Not on file  Food Insecurity: Not on file  Transportation Needs: Not on file  Physical Activity: Not on file  Stress: Not on file  Social Connections: Not on file  Intimate Partner Violence: Not on file    FAMILY HISTORY:  Family History  Problem Relation Age of Onset   Hypertension Mother    Osteopenia Mother    Stroke Mother        deceased age 30   Colon cancer Father 15       mets   Allergies Sister    Breast cancer Cousin        pat cousin   Rectal cancer Neg Hx     ALLERGIES:  is allergic to celebrex [celecoxib], doxycycline, gold-containing drug products, nickel, other, sodium metabisulfite, and zocor [simvastatin].  MEDICATIONS:  Current Outpatient Medications  Medication Sig Dispense Refill   albuterol (VENTOLIN HFA) 108 (90 Base) MCG/ACT inhaler Inhale 2 puffs into the lungs every 6 (six) hours as needed for wheezing or shortness of breath. (Patient not taking: Reported on 05/09/2021) 8 g 3    Cholecalciferol (VITAMIN D3) 50 MCG (2000 UT) capsule TAKE TWO CAPSULES BY MOUTH DAILY 180 capsule 3   clobetasol ointment (TEMOVATE) 0.05 % Apply topically 2 (two) times daily.     diltiazem (CARDIZEM) 30 MG tablet TAKE ONE TABLET BY MOUTH THREE TIMES DAILY AS NEEDED 90 tablet 1   folic acid (FOLVITE) 1 MG tablet Take 1 mg by mouth daily.     levothyroxine (SYNTHROID) 150 MCG tablet 1 pill a day except for 2 pills on Sundays. 110 tablet 3   loperamide (IMODIUM A-D) 2 MG tablet Take 2 mg by mouth 2 (two) times daily as needed for diarrhea or loose stools.     methotrexate 2.5 MG tablet TAKE 5 TABLETS BY MOUTH ONCE A WEEK - TAKE FOLIC ACID ALL OTHER DAYS OF THE WEEK     omeprazole (PRILOSEC) 20 MG capsule TAKE ONE CAPSULE BY MOUTH DAILY 90 capsule 3   propranolol ER (INDERAL LA) 120 MG 24 hr capsule TAKE ONE CAPSULE BY MOUTH AT BEDTIME 90 capsule 3   rosuvastatin (CRESTOR) 5 MG tablet Take 1 tablet (5 mg total) by mouth at bedtime. 90 tablet 3   traMADol (ULTRAM) 50 MG tablet Take 1-2 tablets (50-100 mg total) by mouth every 12 (twelve) hours as needed. CAUTION SEDATION 50 tablet 1   traMADol (ULTRAM) 50 MG tablet tramadol 50 mg tablet  Take 1 tablet every 6-8 hours by oral route as needed for pain for 5 days.     XARELTO 20 MG TABS tablet TAKE ONE TABLET BY MOUTH IN THE EVENING WITH SUPPER 90 tablet 3   diphenhydrAMINE (BENADRYL) 25 MG tablet Take 25 mg by mouth every 8 (eight) hours as needed for itching.     promethazine (PHENERGAN) 25 MG tablet TAKE ONE TABLET BY MOUTH EVERY 6 HOURS AS NEEDED FOR NAUSEA 30 tablet 3   No current facility-administered medications for this visit.      Marland Kitchen  PHYSICAL EXAMINATION:  Vitals:   04/07/21 1138  BP: (!) 157/73  Pulse: 68  Resp: 18  Temp: 98.3 F (36.8 C)  SpO2: 98%   Filed Weights   04/07/21 1138  Weight: 220 lb (99.8 kg)    Physical Exam Vitals and nursing note reviewed.  HENT:     Head: Normocephalic and atraumatic.      Mouth/Throat:     Pharynx: Oropharynx is clear.  Eyes:     Extraocular Movements: Extraocular movements intact.     Pupils: Pupils are equal, round, and reactive to light.  Cardiovascular:     Rate and Rhythm: Normal rate and regular rhythm.  Pulmonary:     Comments: Decreased breath sounds bilaterally.  Abdominal:     Palpations: Abdomen is soft.  Musculoskeletal:        General: Normal range of motion.     Cervical back: Normal range of motion.  Skin:    General: Skin is warm.  Neurological:     General: No focal deficit present.     Mental Status: She is alert and oriented to person, place, and time.  Psychiatric:        Behavior: Behavior normal.        Judgment: Judgment normal.     LABORATORY DATA:  I have reviewed the data as listed Lab Results  Component Value Date   WBC 6.1 04/07/2021   HGB 11.3 (L) 04/07/2021   HCT 36.0 04/07/2021   MCV 81.3 04/07/2021   PLT 170 04/07/2021   Recent Labs    08/09/20 1723 09/13/20 0945 03/15/21 1603 04/07/21 1231  NA 135 140 137 139  K 3.7 4.0 3.6 3.7  CL 97* 103 100 101  CO2 28 31 30 31   GLUCOSE 106* 113* 109* 118*  BUN 22 8 13 11   CREATININE 1.10* 0.62 0.79 0.68  CALCIUM 9.2 9.2 8.8* 8.8*  GFRNONAA 53*  --  >60 >60  PROT 7.5  --   --  6.8  ALBUMIN 4.1  --   --  3.8  AST 23  --   --  17  ALT 18  --   --  13  ALKPHOS 82  --   --  73  BILITOT 1.0  --   --  1.0    RADIOGRAPHIC STUDIES: I have personally reviewed the radiological images as listed and agreed with the findings in the report. No results found.  Lymphadenopathy of head and neck #Chronic generalized lymphadenopathy-biopsy of the retroperitoneal lymph node-nonnecrotizing granulomatous inflammation.  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  #However etiology of above inflammatory generalized lymphadenopathy is unclear.  Given the absence of any  malignancy-limits any oncologic intervention/recommendation.  I would defer further work-up to PCP/pulmonary at this time.  Consider rheumatologic evaluation for any underlying autoimmune conditions.  Thank you Dr.Duncan for allowing me to participate in the care of your pleasant patient. Please do not hesitate to contact me with questions or concerns in the interim.   DISPOSITION: # labs today-CBC CMP LDH ACE levels.  Addendum: ACE normal; labs unremarkable.  Recommend continued follow-up with PCP.  All questions were answered. The patient knows to call the clinic with any problems, questions or concerns.    Cammie Sickle, MD 05/10/2021 9:49 AM

## 2021-04-07 NOTE — Progress Notes (Addendum)
Contacted patient to reschedule missed initial appointment. New appointment scheduled for 05/09/21 with [name].    [name], CPP notified  Margaretmary Dys, [place] [facility] Assistant [phone]

## 2021-04-08 LAB — ANGIOTENSIN CONVERTING ENZYME: Angiotensin-Converting Enzyme: 31 U/L (ref 14–82)

## 2021-04-25 ENCOUNTER — Other Ambulatory Visit: Payer: Self-pay | Admitting: Family Medicine

## 2021-04-25 DIAGNOSIS — Z1231 Encounter for screening mammogram for malignant neoplasm of breast: Secondary | ICD-10-CM

## 2021-05-02 ENCOUNTER — Telehealth: Payer: Self-pay

## 2021-05-02 NOTE — Progress Notes (Addendum)
Chronic Care Management Pharmacy Assistant   Name: Kara Wright  MRN: [id number] DOB: 09-24-1945  Reason for Encounter: CCM (Initial Questions)  Recent office visits:  01/10/2021 - Elsie Stain, MD - Patient presented for neck swelling.Possible lipoma will get MRI. No medication changes. 12/22/2020 - Family Medicine - Patient presented for a rash on bilateral upper and lower extremities.Stop taking Benadryl.Start Zyrtec 10 mg once nightly and famotidine (Pepcid) 20 mg once nightly for itching.  09/28/2020 - Elsie Stain, MD - Patient presented for right arm pain,xrays,splint no medication changes.  Recent consult visits:  04/07/2021 - [facility] - Patient presented for Lymphadenopathy. Labs: CBC, CMP, Angiotensin Converting Enzyme and Lactate Dehydrogenase. No medication changes.  03/31/2021 - Vascular Surgery - Patient presented for 2 wk [facility] post Lymph node biopsy. 03/07/2021 - Vascular Surgery - Patient presented for new referral for lymphadenopathy. MR discussed, and biopsy planned,hold xarelto for 2 days. 01/07/2021 - Dermatology - no data found 12/14/2020 - Ophthalmology - no data found 10/21/2020 - Ophthalmology- no data found  09/23/2020 - Cardiology-Patient presented for follow up paroxysmal atrial fibrillation.Most recent electrolytes at goal with renal function stable and H&H stable,no medication changes follow up 1 year.  Hospital visits:  03/16/21-[facility]-Patient presented for lymph node biopsy-no admission  Medications: Outpatient Encounter Medications as of 05/02/2021  Medication Sig   albuterol (VENTOLIN HFA) 108 (90 Base) MCG/ACT inhaler Inhale 2 puffs into the lungs every 6 (six) hours as needed for wheezing or shortness of breath.   Cholecalciferol (VITAMIN D3) 50 MCG (2000 UT) capsule TAKE TWO CAPSULES BY MOUTH DAILY   clobetasol ointment (TEMOVATE) 0.05 % Apply topically 2 (two) times daily.   diltiazem (CARDIZEM) 30 MG tablet TAKE ONE TABLET BY MOUTH THREE  TIMES DAILY AS NEEDED   diphenhydrAMINE (BENADRYL) 25 MG tablet Take 25 mg by mouth every 8 (eight) hours as needed for itching. (Patient not taking: Reported on 76/73/4193)   folic acid (FOLVITE) 1 MG tablet Take 1 mg by mouth daily.   HYDROcodone-acetaminophen (NORCO) 5-325 MG tablet Take 1 tablet by mouth every 6 (six) hours as needed for moderate pain. (Patient not taking: Reported on 04/07/2021)   ibuprofen (ADVIL) 600 MG tablet Take 600 mg by mouth as needed. (Patient not taking: Reported on 04/07/2021)   levothyroxine (SYNTHROID) 150 MCG tablet 1 pill a day except for 2 pills on Sundays.   loperamide (IMODIUM A-D) 2 MG tablet Take 2 mg by mouth 2 (two) times daily as needed for diarrhea or loose stools.   methotrexate 2.5 MG tablet TAKE 5 TABLETS BY MOUTH ONCE A WEEK - TAKE FOLIC ACID ALL OTHER DAYS OF THE WEEK   omeprazole (PRILOSEC) 20 MG capsule TAKE ONE CAPSULE BY MOUTH DAILY   promethazine (PHENERGAN) 25 MG tablet TAKE ONE TABLET BY MOUTH EVERY 6 HOURS AS NEEDED FOR NAUSEA   propranolol ER (INDERAL LA) 120 MG 24 hr capsule TAKE ONE CAPSULE BY MOUTH AT BEDTIME   rosuvastatin (CRESTOR) 5 MG tablet Take 1 tablet (5 mg total) by mouth at bedtime.   traMADol (ULTRAM) 50 MG tablet Take 1-2 tablets (50-100 mg total) by mouth every 12 (twelve) hours as needed. CAUTION SEDATION   traMADol (ULTRAM) 50 MG tablet tramadol 50 mg tablet  Take 1 tablet every 6-8 hours by oral route as needed for pain for 5 days.   XARELTO 20 MG TABS tablet TAKE ONE TABLET BY MOUTH IN THE EVENING WITH SUPPER   No facility-administered encounter medications on file as of  05/02/2021.    Lab Results  Component Value Date/Time   HGBA1C 5.6 09/13/2020 09:45 AM   HGBA1C 5.5 08/13/2019 10:11 AM     BP Readings from Last 3 Encounters:  04/07/21 (!) 157/73  03/31/21 (!) 159/79  03/16/21 126/60   Patient contacted to review initial questions prior to visit with [name].  Have you seen any other providers  since your last visit with PCP? Yes  Any changes in your medications or health? No  Any side effects from any medications? No  Do you have an symptoms or problems not managed by your medications? No  Any concerns about your health right now? Yes - Patient states she has a spot on her neck with her lymphnodes being swollen   Has your provider asked that you check blood pressure, blood sugar, or follow special diet at home? No  Do you get any type of exercise on a regular basis? No  Can you think of a goal you would like to reach for your health? Yes Patient states she would   Do you have any problems getting your medications? No  Is there anything that you would like to discuss during the appointment? Yes - Patient stats she lost her husband last month on 03/24/2021. She is not sleeping and would like to know if there is anything she can do.   Spoke with patient and reminded them to have all medications, supplements and any blood glucose and blood pressure readings available for review with pharmacist, at their telephone visit on 05/09/2021 at 2:00.   Star Rating Drugs:  Medication:  Last Fill: Day Supply Rosuvastatin 5mg        04/07/2021     30  Care Gaps: Annual wellness visit in last year? Yes 09/16/2020 Most Recent BP reading: 157/73 on 04/07/2021  [name], CPP notified  [name], [place] Assistant [phone]  I have reviewed the care management and care coordination activities outlined in this encounter and I am certifying that I agree with the content of this note. No further action required.  [name], PharmD Clinical Pharmacist [facility] [phone]

## 2021-05-09 ENCOUNTER — Other Ambulatory Visit: Payer: Self-pay

## 2021-05-09 ENCOUNTER — Other Ambulatory Visit: Payer: Self-pay | Admitting: Family Medicine

## 2021-05-09 ENCOUNTER — Ambulatory Visit (INDEPENDENT_AMBULATORY_CARE_PROVIDER_SITE_OTHER): Payer: PPO

## 2021-05-09 DIAGNOSIS — M810 Age-related osteoporosis without current pathological fracture: Secondary | ICD-10-CM

## 2021-05-09 DIAGNOSIS — E7849 Other hyperlipidemia: Secondary | ICD-10-CM

## 2021-05-09 DIAGNOSIS — I10 Essential (primary) hypertension: Secondary | ICD-10-CM

## 2021-05-09 NOTE — Progress Notes (Signed)
Chronic Care Management Pharmacy Note  05/09/2021 Name:  Osha Rane MRN:  [id number] DOB:  [birth year]  Summary: Initial CCM visit. All medications reviewed. Discussed HTN, last clinic BP elevated. Pt thinks due to stress/loss of spouse, trouble sleeping. She has not checked home BP. Asked her to do this and update [place]. Osteoporosis, candidate for treatment. Deferred last year 08/2020. Pt open to discussion of therapy. Will schedule 1 month follow up to address HTN and osteoporosis.  Recommendations: No changes today, limit caffeine as this may worsen nightmares and sleep quality  Plan: Follow up 1 month, [name]   Subjective: Adilen Pavelko is an 75 y.o. year old female who is a primary patient of Damita Dunnings, Elveria Rising, MD.  The CCM team was consulted for assistance with disease management and care coordination needs.    Engaged with patient by telephone for initial visit in response to provider referral for pharmacy case   Consent to Services:  The patient was given the following information about Chronic Care Management services today, agreed to services, and gave verbal consent: 1. CCM service includes personalized support from designated clinical staff supervised by the primary care provider, including individualized plan of care and coordination with other care providers 2. 24/7 contact phone numbers for assistance for urgent and routine care needs. 3. Service will only be billed when office clinical staff spend 20 minutes or more in a month to coordinate care. 4. Only one practitioner may furnish and bill the service in a calendar month. 5.The patient may stop CCM services at any time (effective at the end of the month) by phone call to the office staff. 6. The patient will be responsible for cost sharing (co-pay) of up to 20% of the service fee (after annual deductible is met). Patient agreed to services and consent obtained.  Patient Care Team: Tonia Ghent, MD as PCP -  General (Family Medicine) Rockey Situ Kathlene November, MD as PCP - Cardiology (Cardiology) Dasher, Rayvon Char, MD as Consulting Physician (Dermatology) Minna Merritts, MD as Consulting Physician (Cardiology) Ocie Doyne, OD as Consulting Physician (Optometry) Dale Gasquet, DDS as Consulting Physician (Dentistry) [name], [facility] as Pharmacist (Pharmacist)  Recent office visits:  01/10/2021 - Elsie Stain, MD - Patient presented for neck swelling. Possible lipoma will get MRI, check with radiology. No medication changes. 12/22/2020 - Family Medicine - Patient presented for a rash on bilateral upper and lower extremities.Stop taking Benadryl.Start Zyrtec 10 mg once nightly and famotidine (Pepcid) 20 mg once nightly for itching.  09/28/2020 - Elsie Stain, MD - Patient presented for right arm pain, x-rays, splint no medication changes.   Recent consult visits:  04/07/2021 - [place] - Patient presented for Lymphadenopathy. Labs: CBC, CMP, Angiotensin Converting Enzyme and Lactate Dehydrogenase. No medication changes.  03/31/2021 - Vascular Surgery - Patient presented for 2 week [facility] post lymph node biopsy. 03/07/2021 - Vascular Surgery - Patient presented for new referral for lymphadenopathy. MR discussed, and biopsy planned,hold xarelto for 2 days. 09/23/2020 - Cardiology - Patient presented for follow up paroxysmal atrial fibrillation. Most recent electrolytes at goal with renal function stable and H&H stable, no medication changes follow up 1 year.   Hospital visits:  03/16/21 [facility] - Patient presented for lymph node biopsy.  Objective:  Lab Results  Component Value Date   CREATININE 0.68 04/07/2021   BUN 11 04/07/2021   GFR 87.91 09/13/2020   GFRNONAA >60 04/07/2021   GFRAA >60 10/28/2016   NA 139 04/07/2021  K 3.7 04/07/2021   CALCIUM 8.8 (L) 04/07/2021   CO2 31 04/07/2021   GLUCOSE 118 (H) 04/07/2021    Lab Results  Component Value Date/Time   HGBA1C 5.6 09/13/2020 09:45 AM    HGBA1C 5.5 08/13/2019 10:11 AM   GFR 87.91 09/13/2020 09:45 AM   GFR 89.34 08/13/2019 10:11 AM   Lab Results  Component Value Date   CHOL 110 09/13/2020   HDL 34.70 (L) 09/13/2020   LDLCALC 45 09/13/2020   LDLDIRECT 94.9 06/15/2011   TRIG 154.0 (H) 09/13/2020   CHOLHDL 3 09/13/2020    Hepatic Function Latest Ref Rng & Units 04/07/2021 08/09/2020 08/13/2019  Total Protein 6.5 - 8.1 g/dL 6.8 7.5 6.1  Albumin 3.5 - 5.0 g/dL 3.8 4.1 3.7  AST 15 - 41 U/L 17 23 14   ALT 0 - 44 U/L 13 18 12   Alk Phosphatase 38 - 126 U/L 73 82 68  Total Bilirubin 0.3 - 1.2 mg/dL 1.0 1.0 0.7  Bilirubin, Direct 0.1 - 0.5 mg/dL - - -    Lab Results  Component Value Date/Time   TSH 5.50 (H) 09/13/2020 09:45 AM   TSH 1.60 08/13/2019 10:11 AM   FREET4 0.70 06/07/2010 09:03 AM   FREET4 1.2 05/17/2009 12:00 AM    CBC Latest Ref Rng & Units 04/07/2021 03/15/2021 09/30/2020  WBC 4.0 - 10.5 K/uL 6.1 6.8 6.6  Hemoglobin 12.0 - 15.0 g/dL 11.3(L) 11.3(L) 12.2  Hematocrit 36.0 - 46.0 % 36.0 35.5(L) 38.3  Platelets 150 - 400 K/uL 170 171 172   Lab Results  Component Value Date/Time   VD25OH 36.15 09/13/2020 09:45 AM   VD25OH 32.68 08/13/2019 10:11 AM   Clinical ASCVD: Yes  The ASCVD Risk score (Arnett DK, et al., 2019) failed to calculate for the following reasons:   The valid total cholesterol range is 130 to 320 mg/dL    Depression screen [facility] 2/9 01/10/2021 08/21/2019 08/08/2018  Decreased Interest 0 0 0  Down, Depressed, Hopeless 0 0 0  PHQ - 2 Score 0 0 0  Altered sleeping - 0 0  Tired, decreased energy - 0 0  Change in appetite - 0 0  Feeling bad or failure about yourself  - 0 0  Trouble concentrating - 0 0  Moving slowly or fidgety/restless - 0 0  Suicidal thoughts - 0 0  PHQ-9 Score - 0 0  Difficult doing work/chores - Not difficult at all Not difficult at all  Some recent data might be hidden    Social History   Tobacco Use  Smoking Status Former   Packs/day: 1.00   Types: Cigarettes    Quit date: 06/26/1986   Years since quitting: 34.9  Smokeless Tobacco Never   BP Readings from Last 3 Encounters:  05/10/21 (!) 141/69  04/07/21 (!) 157/73  03/31/21 (!) 159/79   Pulse Readings from Last 3 Encounters:  05/10/21 76  04/07/21 68  03/31/21 79   Wt Readings from Last 3 Encounters:  04/07/21 220 lb (99.8 kg)  03/31/21 223 lb (101.2 kg)  03/16/21 224 lb 6.9 oz (101.8 kg)   BMI Readings from Last 3 Encounters:  04/07/21 37.76 kg/m  03/31/21 38.28 kg/m  03/16/21 38.05 kg/m    Assessment/Interventions: Review of patient past medical history, allergies, medications, health status, including review of consultants reports, laboratory and other test data, was performed as part of comprehensive evaluation and provision of chronic care management services.   SDOH:  (Social Determinants of Health) assessments and interventions performed:  Yes SDOH Interventions    Flowsheet Row Most Recent Value  SDOH Interventions   Financial Strain Interventions Intervention Not Indicated      SDOH Screenings   Alcohol Screen: Not on file  Depression (PHQ2-9): Low Risk    PHQ-2 Score: 0  Financial Resource Strain: Low Risk    Difficulty of Paying Living Expenses: Not very hard  Food Insecurity: Not on file  Housing: Not on file  Physical Activity: Not on file  Social Connections: Not on file  Stress: Not on file  Tobacco Use: Medium Risk   Smoking Tobacco Use: Former   Smokeless Tobacco Use: Never   Passive Exposure: Not on file  Transportation Needs: Not on file    CCM Care Plan  Allergies  Allergen Reactions   Celebrex [Celecoxib]     REACTION: nausea: GI UPSET   Doxycycline Other (See Comments)    GI upset- not an allergy.     Gold-Containing Drug Products    Nickel     Skin rash, allergy   Other     Fragrance, phenylenediamine, contrast metal agents, gold   Sodium Metabisulfite Other (See Comments)    Positive patch test   Zocor [Simvastatin]     Aches but  tolerates crestor    Medications Reviewed Today     Reviewed by Etheleen Mayhew, RN (Registered Nurse) on 05/10/21 at [place] List Status: <None>   Medication Order Taking? Sig Documenting Provider Last Dose Status Informant  albuterol (VENTOLIN HFA) 108 (90 Base) MCG/ACT inhaler [id number]  Inhale 2 puffs into the lungs every 6 (six) hours as needed for wheezing or shortness of breath.  Patient not taking: Reported on 05/09/2021   Garner Nash, DO  Active   Cholecalciferol (VITAMIN D3) 50 MCG (2000 UT) capsule [id number]  TAKE TWO CAPSULES BY MOUTH DAILY Tonia Ghent, MD  Active   clobetasol ointment (TEMOVATE) 0.05 % [id number]  Apply topically 2 (two) times daily. [provider]  Active   diltiazem (CARDIZEM) 30 MG tablet [id number]  TAKE ONE TABLET BY MOUTH THREE TIMES DAILY AS NEEDED Tonia Ghent, MD  Active   diphenhydrAMINE (BENADRYL) 25 MG tablet [id number]  Take 25 mg by mouth every 8 (eight) hours as needed for itching. [provider]  Active   folic acid (FOLVITE) 1 MG tablet [id number]  Take 1 mg by mouth daily. [provider]  Active   levothyroxine (SYNTHROID) 150 MCG tablet [id number]  1 pill a day except for 2 pills on Sundays. Tonia Ghent, MD  Active   loperamide (IMODIUM A-D) 2 MG tablet [id number]  Take 2 mg by mouth 2 (two) times daily as needed for diarrhea or loose stools. [provider]  Active   methotrexate 2.5 MG tablet [id number]  TAKE 5 TABLETS BY MOUTH ONCE A WEEK - TAKE FOLIC ACID ALL OTHER DAYS OF THE WEEK [provider]  Active   omeprazole (PRILOSEC) 20 MG capsule [id number]  TAKE ONE CAPSULE BY MOUTH DAILY Tonia Ghent, MD  Active   promethazine (PHENERGAN) 25 MG tablet [id number]  TAKE ONE TABLET BY MOUTH EVERY 6 HOURS AS NEEDED FOR NAUSEA Tonia Ghent, MD  Active   propranolol ER (INDERAL LA) 120 MG 24 hr capsule [id number]  TAKE ONE CAPSULE BY MOUTH AT BEDTIME Tonia Ghent, MD  Active    rosuvastatin (CRESTOR) 5 MG tablet [id number]  Take 1 tablet (5 mg total) by mouth at bedtime. Tonia Ghent, MD  Active   traMADol (ULTRAM) 50 MG tablet [id number]  Take 1-2 tablets (50-100 mg total) by mouth every 12 (twelve) hours as needed. CAUTION SEDATION Tonia Ghent, MD  Active   traMADol Veatrice Bourbon) 50 MG tablet [id number]  tramadol 50 mg tablet  Take 1 tablet every 6-8 hours by oral route as needed for pain for 5 days. [provider]  Active   XARELTO 20 MG TABS tablet [id number]  TAKE ONE TABLET BY MOUTH IN THE EVENING WITH SUPPER Tonia Ghent, MD  Active             Patient Active Problem List   Diagnosis Date Noted   Neck mass 01/12/2021   Right wrist pain 09/29/2020   Lymphadenopathy of head and neck 09/29/2020   Hearing loss 09/28/2020   Chest wall pain 09/19/2020   Sleep apnea 01/19/2020   Lung nodule 01/19/2020   SUI (stress urinary incontinence, female) 08/24/2019   Itching 08/24/2019   SOB (shortness of breath) 08/24/2019   Exposure to 2019 novel coronavirus 09/18/2018   Cough 10/28/2017   Health care maintenance 08/13/2017   Vitamin D deficiency 04/10/2017   Hip pain 04/10/2017   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   Low back pain 08/04/2016   Dysuria 09/02/2015   Morbid obesity ([place]) 04/05/2015   Advance care planning 08/07/2014   Anxiety 07/16/2012   Esophagitis 04/17/2012   TIA (transient ischemic attack) 08/27/2011   Medicare annual wellness visit, subsequent 06/25/2011   Atrial fibrillation ([place]) 10/31/2010   HTN (hypertension) 10/31/2010   VARICOSE VEINS, LOWER EXTREMITIES 08/10/2008   Hypothyroidism 12/15/2006   HLD (hyperlipidemia) 12/15/2006   MIGRAINE HEADACHE 12/15/2006   GERD (gastroesophageal reflux disease) 12/15/2006   DIVERTICULOSIS, COLON 12/15/2006   Osteoporosis 12/15/2006    Immunization History  Administered Date(s) Administered   Fluad Quad(high Dose 65+)  03/21/2019   Influenza Split 04/13/2011, 04/26/2012   Influenza Whole 04/07/2009, 04/13/2010   Influenza, High Dose Seasonal PF 04/09/2017, 04/18/2018   Influenza,inj,Quad PF,6+ Mos 05/01/2013, 04/30/2014, 04/05/2015, 06/05/2016   Influenza-Unspecified 04/10/2020   PFIZER(Purple Top)SARS-COV-2 Vaccination 08/06/2019, 08/27/2019, 08/03/2020   Pneumococcal Conjugate-13 08/06/2014   Pneumococcal Polysaccharide-23 07/16/2012   Td 04/04/2006, 02/20/2012   Zoster Recombinat (Shingrix) 03/18/2020, 08/03/2020   Zoster, Live 05/02/2007    Conditions to be addressed/monitored:  Hypertension, Hyperlipidemia, Atrial Fibrillation, GERD, Hypothyroidism, and Osteoporosis  Care Plan : [place]  Updates made by [name], [place] since 05/16/2021 12:00 AM     Problem: CHL AMB "PATIENT-SPECIFIC PROBLEM"      Long-Range Goal: Disease Management   Start Date: 05/09/2021  Priority: High  Note:   Current Barriers:  Suboptimal therapeutic regimen for osteoporosis Unable to achieve control of blood pressure  Pharmacist Clinical Goal(s):  Patient will contact provider office for questions/concerns as evidenced notation of same in electronic health record through collaboration with PharmD and provider.   Interventions: 1:1 collaboration with Tonia Ghent, MD regarding development and update of comprehensive plan of care as evidenced by provider attestation and co-signature Inter-disciplinary care team collaboration (see longitudinal plan of care) Comprehensive medication review performed; medication list updated in electronic medical record  Hypertension (BP goal <140/90) -Not ideally controlled - pt reports BP may have been up recently due to stress with passing of husband, she has not checked home BP -Current treatment: Propranolol 120 mg - 1 tablet daily at bedtime  -Medications previously tried: none  -Current home readings: she has a  home monitor, has not checked -Denies  hypotensive/hypertensive symptoms -Wears CPAP routinely  -Educated on BP goals and benefits of medications for prevention of heart attack, stroke and kidney damage; -Counseled to monitor BP at home weekly, document, and provide log at future appointments -Recommended to continue current medication; Check home BP when rested. Call us with an update.  Hyperlipidemia: (LDL goal < 70) -Controlled - LDL within goal, pt affirms adherence -Current treatment: Crestor 5 mg daily -Medications previously tried: none reported  -Current dietary patterns: not discussed -Current exercise habits: watches her grandbaby 2-3 days a week, stays active  -Educated on Cholesterol goals;  -Recommended to continue current medication  Atrial Fibrillation (Goal: prevent stroke and major bleeding) -Controlled -CHADSVASC: 6 This patients CHA2DS2-VASc Score and unadjusted Ischemic Stroke Rate (% per year) is equal to 9.7 % stroke rate/year from a score of 6 -Current treatment: Rate control: Propranolol daily, diltiazem PRN  Anticoagulation: Xarelto 20 mg daily -Medications previously tried: none reported -Home BP and HR readings: none reported  -Xarelto dose appropriate for renal function -Counseled on importance of adherence to anticoagulant exactly as prescribed; -Recommended to continue current medication  Sleep disturbance (Goal: Improve sleep maintenance, WASO) -New problem, Not ideally controlled - Pt reports vivid dreams (started after husband's passing 01/2021). These wake her up at night. She is unable to fall back asleep.  -Current treatment: None  -Medications previously tried/failed: melatonin has helped with sleep in the past -PHQ9: 0 (01/10/21) - pt denies anxiety/depression today -GAD7: none  -Educated on Benefits of medication for symptom control Benefits of cognitive-behavioral therapy with or without medication -She denies interested in grief counseling. She talks to her sister for  support. -She is in bed from 11 PM to 8:30 AM most nights. May also wake up 2-3 times to urinate.  -Recommended limit caffeine as it may exacerbate vivid dreams. She may also try melatonin 3-5 mg nightly.    Hypothyroidism (Goal: TSH, T4 within goal) -Query Controlled (dose increased after last TSH mildly elevated) -Current treatment  Levothyroxine 150 mcg - 1 tablet daily and 2 on Sundays -Medications previously tried: none  -Pt affirms correct dosing. Denies symptoms. -Recommended to continue current medication  Osteoporosis / Osteopenia (Goal Improve bone density, prevent fractures) -Not ideally controlled -Last DEXA Scan: 07/20/20  -Pt reports she fell and broke her ribs a few months ago. She admits to poor balance, does not use support devices, just tries to be careful. No falls since this incident.   T-Score femoral neck: -2.2   T-Score total hip: n/a  T-Score lumbar spine: -2.3  T-Score forearm radius: n/a  10-year probability of major osteoporotic fracture: 31.4%  10-year probability of hip fracture: 16.1% -Patient is a candidate for pharmacologic treatment due to T-Score -1.0 to -2.5 and 10-year risk of major osteoporotic fracture > 20% and T-Score -1.0 to -2.5 and 10-year risk of hip fracture > 3%. Fosamax deferred for now per PCP visit 09/16/20 (pain, etc). -Current treatment  Vitamin D3 2000 IU - 2 caps daily -Medications previously tried: none  -Recommend [phone] units of vitamin D daily. Recommend 1200 mg of calcium daily from dietary and supplemental sources. Discussed medication treatment. I told her I would review DEXA and we would discuss if DEXA due. She actually had a recent DEXA and treatment recommended.  -Recommended to continue current medication; Recommend discussion of treatment options at follow up visit.  GERD (Goal: Improve symptoms) -Controlled - per patient report -Query safe, may increase risk of decrease  bone density/increase risk of fractures    -Current treatment  Omeprazole 20 mg - 1 capsule daily -Medications previously tried: none  -Recommended to continue current medication; Consider taper off therapy if symptoms controlled without breakthrough.  Patient Goals/Self-Care Activities Patient will:  - take medications as prescribed as evidenced by patient report and record review check blood pressure this week, document, and provide at future appointments  Follow Up Plan: CCM follow up 1 month for osteoporosis/HTN     Medication Assistance: None required.  Patient affirms current coverage meets needs. Discussed Xarelto, she may apply next year. Able to afford at this time.  Compliance/Adherence/Medication fill history: Care Gaps: Mammogram - appt scheduled   Star-Rating Drugs: Medication:                Last Fill:         Day Supply Rosuvastatin 73m       04/07/2021     30  [facility] [address]- [address] [id number]: [phone]: [address]- [address]Phone: [phone]: [phone] BAlvis Lemmings([facility] - pill packs  All medications come through mail/packs except folic acid, methotrexate, and tramadol [facility] Uses pill box? Yes Pt endorses 100% compliance  We discussed: Benefits of medication synchronization, packaging and delivery as well as enhanced pharmacist oversight with [employer]. Not discussed as patient is doing well with current delivery/pill packs.  Care Plan and Follow Up Patient Decision:  Patient agrees to Care Plan and Follow-up.  MDebbora Dus PharmD Clinical Pharmacist [facility] at [phone]

## 2021-05-10 ENCOUNTER — Telehealth: Payer: Self-pay | Admitting: Internal Medicine

## 2021-05-10 ENCOUNTER — Other Ambulatory Visit: Payer: Self-pay

## 2021-05-10 ENCOUNTER — Ambulatory Visit (INDEPENDENT_AMBULATORY_CARE_PROVIDER_SITE_OTHER): Payer: PPO

## 2021-05-10 ENCOUNTER — Encounter: Payer: Self-pay | Admitting: Emergency Medicine

## 2021-05-10 ENCOUNTER — Ambulatory Visit
Admission: EM | Admit: 2021-05-10 | Discharge: 2021-05-10 | Disposition: A | Discharge status: Home / Self Care | Payer: PPO | Attending: Physician Assistant | Admitting: Physician Assistant

## 2021-05-10 ENCOUNTER — Telehealth: Payer: Self-pay | Admitting: Family Medicine

## 2021-05-10 ENCOUNTER — Telehealth: Payer: Self-pay

## 2021-05-10 DIAGNOSIS — E785 Hyperlipidemia, unspecified: Secondary | ICD-10-CM | POA: Diagnosis not present

## 2021-05-10 DIAGNOSIS — G4730 Sleep apnea, unspecified: Secondary | ICD-10-CM | POA: Diagnosis not present

## 2021-05-10 DIAGNOSIS — I10 Essential (primary) hypertension: Secondary | ICD-10-CM | POA: Insufficient documentation

## 2021-05-10 DIAGNOSIS — I4891 Unspecified atrial fibrillation: Secondary | ICD-10-CM | POA: Insufficient documentation

## 2021-05-10 DIAGNOSIS — R059 Cough, unspecified: Secondary | ICD-10-CM

## 2021-05-10 DIAGNOSIS — R52 Pain, unspecified: Secondary | ICD-10-CM | POA: Diagnosis not present

## 2021-05-10 DIAGNOSIS — R5383 Other fatigue: Secondary | ICD-10-CM | POA: Diagnosis not present

## 2021-05-10 DIAGNOSIS — Z20822 Contact with and (suspected) exposure to covid-19: Secondary | ICD-10-CM | POA: Diagnosis not present

## 2021-05-10 DIAGNOSIS — R051 Acute cough: Secondary | ICD-10-CM | POA: Insufficient documentation

## 2021-05-10 DIAGNOSIS — R591 Generalized enlarged lymph nodes: Secondary | ICD-10-CM

## 2021-05-10 DIAGNOSIS — Z7901 Long term (current) use of anticoagulants: Secondary | ICD-10-CM | POA: Diagnosis not present

## 2021-05-10 DIAGNOSIS — Z8673 Personal history of transient ischemic attack (TIA), and cerebral infarction without residual deficits: Secondary | ICD-10-CM | POA: Diagnosis not present

## 2021-05-10 DIAGNOSIS — E039 Hypothyroidism, unspecified: Secondary | ICD-10-CM | POA: Diagnosis not present

## 2021-05-10 DIAGNOSIS — R079 Chest pain, unspecified: Secondary | ICD-10-CM

## 2021-05-10 NOTE — Telephone Encounter (Signed)
[place] [facility] - Client TELEPHONE ADVICE RECORD Access[facility]urse Patient Name: Kara Wright Gender: Female DOB: [birth year] Age: 75 Y 9 M 11 D Return Phone Number: [phone] (Primary), [phone] (Secondary) Address: [address] City/ State/ Zip: [place]  [address] Client [name] - Client Client Site [place] - Day Provider Renford Dills - MD Contact Type Call Who Is Calling Patient / Member / Family / Caregiver Call Type Triage / Clinical Relationship To Patient Self Return Phone Number [phone] (Primary) Chief Complaint CHEST PAIN - pain, pressure, heaviness or tightness Reason for Call Symptomatic / Request for [place] was transferred by office Caller had flu, Covid booster and the tetanus shot over the weekend. Caller has a cough, sneezing, congestion, chills, body aches, and chest pain that feels like a broken rib. Caller has painful breathing. [place] [facility] Translation No Nurse Assessment Nurse: Ysidro Evert, RN, [name] Date/Time [name] Time): 05/10/2021 3:00:53 PM Confirm and document reason for call. If symptomatic, describe symptoms. ---Caller states she is having cough and congestion that started Saturday. She got a flu shot, covid shot and tetanus shot on Friday evening. Injections sites are looking ok. Her chest is hurting Does the patient have any new or worsening symptoms? ---Yes Will a triage be completed? ---Yes Related visit to physician within the last 2 weeks? ---No Does the PT have any chronic conditions? (i.e. diabetes, asthma, this includes High risk factors for pregnancy, etc.) ---No Is this a behavioral health or substance abuse call? ---No Guidelines Guideline Title Affirmed Question Affirmed Notes Nurse Date/Time ([place] Time) COVID-19 - Diagnosed or Suspected Chest pain  or pressure (Exception: MILD central chest pain, present only when coughing) Ysidro Evert, RN, [name] 05/10/2021 3:03:46 PM PLEASE NOTE: All timestamps contained within this report are represented as [place] Standard Time. CONFIDENTIALTY NOTICE: This fax transmission is intended only for the addressee. It contains information that is legally privileged, confidential or otherwise protected from use or disclosure. If you are not the intended recipient, you are strictly prohibited from reviewing, disclosing, copying using or disseminating any of this information or taking any action in reliance on or regarding this information. If you have received this fax in error, please notify us immediately by telephone so that we can arrange for its return to [place]. Phone: [phone], Toll-Free: [phone], Fax: [phone] Page: 2 of 2 Call Id: [id number] [place]. Time [name] Time) Disposition Final User 05/10/2021 2:57:23 PM Send to Urgent Queue Achilles Dunk 05/10/2021 3:06:35 PM Go to ED Now (or PCP triage) Yes Ysidro Evert, RN, [place] Shutter Disagree/Comply Comply Caller Understands Yes PreDisposition Did not know what to do Care Advice Given Per Guideline GO TO ED NOW (OR PCP TRIAGE): * IF NO PCP (PRIMARY CARE PROVIDER) SECOND-LEVEL TRIAGE: You need to be seen within the next hour. Go to the [place] at _____________ [place] as soon as you can. CARE ADVICE given per COVID-19 - DIAGNOSED OR SUSPECTED (Adult) guideline. Referrals GO TO FACILITY OTHER - SPECIF

## 2021-05-10 NOTE — ED Triage Notes (Signed)
Pt presents today with c/o nasal congestion, cough and fever since Saturday. She reports getting Flu, Covid and Tetanus vaccine (all three) on Friday.

## 2021-05-10 NOTE — Telephone Encounter (Signed)
I spoke with pt and she is getting ready to go to [facility] in [place] now. Pt said she thought every day she was going to feel better but instead she feels worse. Sending note to Dr Damita Dunnings and Janett Billow CMA.

## 2021-05-10 NOTE — Telephone Encounter (Signed)
Discussed with Dr. 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  Recommend evaluation with rheumatology for further work-up/consideration/recommendation for lymphadenopathy.    However given the complicated medical history recommend discussion at the tumor conference.   GB

## 2021-05-10 NOTE — ED Provider Notes (Signed)
[facility]    CSN: [id number] Arrival date & time: 05/10/21  1630      History   Chief Complaint Chief Complaint  Patient presents with   Cough   Nasal Congestion    HPI Kara Wright is a 75 y.o. female presenting for 3-day history of feeling feverish, fatigue, body aches, cough, congestion and sore throat.  Patient reports receiving COVID booster, flu shot and tetanus vaccine the day before symptom onset.  She is unsure if that is potentially related.  She has not recorded a fever.  She denies any breathing difficulty or wheezing.  No vomiting or diarrhea but has felt nauseous at times.  Patient has not taken any OTC meds for symptoms other than Mucinex.  She is currently afebrile.  No known COVID or flu exposure.  She does have significant medical history for hypertension, hyperlipidemia, hypothyroidism, previous stroke, sleep apnea, atrial fibrillation.  Patient is anticoagulated with Xarelto.  She has no other complaints.  HPI  Past Medical History:  Diagnosis Date   Atrial fib/flutter, transient    1 episode, resolved, never on coumadin   Cataract 2019   left eye   Clotting disorder ([place])    on xarelto   Diverticulosis 03/1997   Dyspnea    Dysrhythmia    atrial fibrilation   GERD (gastroesophageal reflux disease) 1997   good on prilosec   Hepatitis    hx of hep B (late 1970s)   Hyperlipidemia 08/1996   Hypothyroidism 1984   Migraine    w/o aura   Osteopenia 11/2002   via dexa   Presence of dental prosthetic device    temporary while waiting for implant   Skin cancer    skin cancer   Sleep apnea    Stroke ([place]) 08/2011   TIA    Patient Active Problem List   Diagnosis Date Noted   Neck mass 01/12/2021   Right wrist pain 09/29/2020   Lymphadenopathy of head and neck 09/29/2020   Hearing loss 09/28/2020   Chest wall pain 09/19/2020   Sleep apnea 01/19/2020   Lung nodule 01/19/2020   SUI (stress urinary incontinence, female) 08/24/2019    Itching 08/24/2019   SOB (shortness of breath) 08/24/2019   Exposure to 2019 novel coronavirus 09/18/2018   Cough 10/28/2017   Health care maintenance 08/13/2017   Vitamin D deficiency 04/10/2017   Hip pain 04/10/2017   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   Low back pain 08/04/2016   Dysuria 09/02/2015   Morbid obesity ([place]) 04/05/2015   Advance care planning 08/07/2014   Anxiety 07/16/2012   Esophagitis 04/17/2012   TIA (transient ischemic attack) 08/27/2011   Medicare annual wellness visit, subsequent 06/25/2011   Atrial fibrillation ([place]) 10/31/2010   HTN (hypertension) 10/31/2010   VARICOSE VEINS, LOWER EXTREMITIES 08/10/2008   Hypothyroidism 12/15/2006   HLD (hyperlipidemia) 12/15/2006   MIGRAINE HEADACHE 12/15/2006   GERD (gastroesophageal reflux disease) 12/15/2006   DIVERTICULOSIS, COLON 12/15/2006   Osteoporosis 12/15/2006    Past Surgical History:  Procedure Laterality Date   ABDOMINAL HYSTERECTOMY     ANAL FISSURE REPAIR     CATARACT EXTRACTION W/PHACO Right 02/06/2018   Procedure: CATARACT EXTRACTION PHACO AND INTRAOCULAR LENS PLACEMENT ([place]) RIGHT;  Surgeon: Leandrew Koyanagi, MD;  Location: [facility];  Service: Ophthalmology;  Laterality: Right;   CHOLECYSTECTOMY  07/2006   LYMPH NODE BIOPSY Right 03/16/2021   Procedure: LYMPH NODE BIOPSY;  Surgeon: [name]  G, MD;  Location: [facility];  Service: Vascular;  Laterality: Right;   MOHS SURGERY  12/05/2006   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 Hulda Humphrey    OB History   No obstetric history on file.      Home Medications    Prior to Admission medications   Medication Sig Start Date End Date Taking? Authorizing Provider  albuterol (VENTOLIN  HFA) 108 (90 Base) MCG/ACT inhaler Inhale 2 puffs into the lungs every 6 (six) hours as needed for wheezing or shortness of breath. Patient not taking: Reported on 05/09/2021 12/18/18   Garner Nash, DO  Cholecalciferol (VITAMIN D3) 50 MCG (2000 UT) capsule TAKE TWO CAPSULES BY MOUTH DAILY 09/07/20   Tonia Ghent, MD  clobetasol ointment (TEMOVATE) 0.05 % Apply topically 2 (two) times daily. 01/07/21   [provider]  diltiazem (CARDIZEM) 30 MG tablet TAKE ONE TABLET BY MOUTH THREE TIMES DAILY AS NEEDED 08/05/20   Tonia Ghent, MD  diphenhydrAMINE (BENADRYL) 25 MG tablet Take 25 mg by mouth every 8 (eight) hours as needed for itching.    [provider]  folic acid (FOLVITE) 1 MG tablet Take 1 mg by mouth daily. 06/07/20   [provider]  levothyroxine (SYNTHROID) 150 MCG tablet 1 pill a day except for 2 pills on Sundays. 09/16/20   Tonia Ghent, MD  loperamide (IMODIUM A-D) 2 MG tablet Take 2 mg by mouth 2 (two) times daily as needed for diarrhea or loose stools.    [provider]  methotrexate 2.5 MG tablet TAKE 5 TABLETS BY MOUTH ONCE A WEEK - TAKE FOLIC ACID ALL OTHER DAYS OF THE WEEK 07/16/20   [provider]  omeprazole (PRILOSEC) 20 MG capsule TAKE ONE CAPSULE BY MOUTH DAILY 09/07/20   Tonia Ghent, MD  promethazine (PHENERGAN) 25 MG tablet TAKE ONE TABLET BY MOUTH EVERY 6 HOURS AS NEEDED FOR NAUSEA 05/10/21   Tonia Ghent, MD  propranolol ER (INDERAL LA) 120 MG 24 hr capsule TAKE ONE CAPSULE BY MOUTH AT BEDTIME 09/07/20   Tonia Ghent, MD  rosuvastatin (CRESTOR) 5 MG tablet Take 1 tablet (5 mg total) by mouth at bedtime. 09/16/20   Tonia Ghent, MD  traMADol (ULTRAM) 50 MG tablet Take 1-2 tablets (50-100 mg total) by mouth every 12 (twelve) hours as needed. CAUTION SEDATION 09/16/20   Tonia Ghent, MD  traMADol Veatrice Bourbon) 50 MG tablet tramadol 50 mg tablet  Take 1 tablet every 6-8 hours by oral route as needed for pain  for 5 days.    [provider]  XARELTO 20 MG TABS tablet TAKE ONE TABLET BY MOUTH IN THE EVENING WITH SUPPER 09/07/20   Tonia Ghent, MD    Family History Family History  Problem Relation Age of Onset   Hypertension Mother    Osteopenia Mother    Stroke Mother        deceased age 33   Colon cancer Father 51       mets   Allergies Sister    Breast cancer Cousin        pat cousin   Rectal cancer Neg Hx     Social History Social History   Tobacco Use  Smoking status: Former    Packs/day: 1.00    Types: Cigarettes    Quit date: 06/26/1986    Years since quitting: 34.8   Smokeless tobacco: Never  Vaping Use   Vaping Use: Never used  Substance Use Topics   Alcohol use: No   Drug use: No     Allergies   Celebrex [celecoxib], Doxycycline, Gold-containing drug products, Nickel, Other, Sodium metabisulfite, and Zocor [simvastatin]   Review of Systems Review of Systems  Constitutional:  Positive for fatigue. Negative for chills, diaphoresis and fever.  HENT:  Positive for congestion, rhinorrhea and sore throat. Negative for ear pain, sinus pressure and sinus pain.   Respiratory:  Positive for cough. Negative for shortness of breath.   Gastrointestinal:  Negative for abdominal pain, nausea and vomiting.  Musculoskeletal:  Positive for myalgias. Negative for arthralgias.  Skin:  Negative for rash.  Neurological:  Negative for weakness and headaches.  Hematological:  Negative for adenopathy.    Physical Exam Triage Vital Signs ED Triage Vitals  Enc Vitals Group     BP 05/10/21 1648 (!) 141/69     Pulse Rate 05/10/21 1648 76     Resp 05/10/21 1648 18     Temp 05/10/21 1648 98.7 F (37.1 C)     Temp Source 05/10/21 1648 Oral     SpO2 05/10/21 1648 96 %     Weight --      Height --      Head Circumference --      Peak Flow --      Pain Score 05/10/21 1650 8     Pain Loc --      Pain Edu? --      Excl. in [place]? --    No data found.  Updated Vital  Signs BP (!) 141/69 (BP Location: Left Arm)   Pulse 76   Temp 98.7 F (37.1 C) (Oral)   Resp 18   SpO2 96%      Physical Exam Vitals and nursing note reviewed.  Constitutional:      General: She is not in acute distress.    Appearance: Normal appearance. She is obese. She is ill-appearing. She is not toxic-appearing.  HENT:     Head: Normocephalic and atraumatic.     Nose: Congestion and rhinorrhea (trace clear drainage) present.     Mouth/Throat:     Mouth: Mucous membranes are moist.     Pharynx: Oropharynx is clear.  Eyes:     General: No scleral icterus.       Right eye: No discharge.        Left eye: No discharge.     Conjunctiva/sclera: Conjunctivae normal.  Cardiovascular:     Rate and Rhythm: Normal rate and regular rhythm.     Heart sounds: Normal heart sounds.  Pulmonary:     Effort: Pulmonary effort is normal. No respiratory distress.     Breath sounds: Normal breath sounds. No wheezing, rhonchi or rales.  Chest:     Chest wall: Tenderness (Diffuse chest wall tenderness) present.  Musculoskeletal:     Cervical back: Neck supple.  Skin:    General: Skin is dry.  Neurological:     General: No focal deficit present.     Mental Status: She is alert. Mental status is at baseline.     Motor: No weakness.     Gait: Gait normal.  Psychiatric:        Mood and Affect: Mood normal.  Behavior: Behavior normal.        Thought Content: Thought content normal.     UC Treatments / Results  Labs (all labs ordered are listed, but only abnormal results are displayed) Labs Reviewed  SARS CORONAVIRUS 2 (TAT 6-24 HRS)    EKG   Radiology DG Chest 2 View  Result Date: 05/10/2021 CLINICAL DATA:  Cough chest pain EXAM: CHEST - 2 VIEW COMPARISON:  08/22/2019, CT 07/12/2020 FINDINGS: No focal opacity or pleural effusion. Stable cardiomediastinal silhouette with enlarged central pulmonary vessels. Aortic atherosclerosis. No pneumothorax. IMPRESSION: No active  cardiopulmonary disease. Enlarged central pulmonary arteries suggestive of arterial hypertension. Electronically Signed   By: Donavan Foil M.D.   On: 05/10/2021 19:22    Procedures Procedures (including critical care time)  Medications Ordered in UC Medications - No data to display  Initial Impression / Assessment and Plan / UC Course  I have reviewed the triage vital signs and the nursing notes.  Pertinent labs & imaging results that were available during my care of the patient were reviewed by me and considered in my medical decision making (see chart for details).  75 year old female presenting for feeling fatigued and feverish, cough, congestion and sore throat as well as nausea for the past 3 days.  Symptom onset the day after she received COVID booster, flu shot and tetanus immunization.  Vitals are stable today.  She is mildly ill-appearing but nontoxic.  She has congestion on exam.  She does have tenderness palpation of her chest and bilateral deltoids where she received the injections.  Chest clear to auscultation heart regular rate and rhythm.  Chest x-ray obtained today since she complained that her chest was a bit achy.  Chest x-ray reviewed by me.  No evidence of pneumonia.  No changes on her chest x-ray compared to recent imaging with CT.  Advised patient I believe her symptoms are secondary to the multiple immunizations that she received and I did advise supportive care with increasing rest and fluids, Tylenol and Mucinex.  Additionally we did test her for COVID-19.  Current CDC guidelines, isolation protocol and ED precautions reviewed if positive for COVID-19.  Advised that she would be candidate for antiviral medication and we will call her if that is the case.  Otherwise, supportive care and follow-up for any acute worsening of symptoms.  ED precautions reviewed.   Final Clinical Impressions(s) / UC Diagnoses   Final diagnoses:  Acute cough  Body aches  Other fatigue      Discharge Instructions      -I will call if there is any sign of pneumonia on your chest x-ray or other concerning findings but I did not see any definite pneumonia. - COVID test obtained.  Results to be back tomorrow.  If positive need to be isolated 5 days.Then wear a mask x 5 days.  If you are positive for COVID we can send antiviral medication.  Someone will contact you. - If COVID is negative, as we discussed this could be related to a vaccine side effects versus a viral illness. - Supportive care advised with over-the-counter Tylenol or low-dose Motrin, rest and fluids.  Also over-the-counter Mucinex or other cough medication as needed. - If any fever, increased chest discomfort or breathing trouble need to be seen again.     ED Prescriptions   None    PDMP not reviewed this encounter.   Danton Clap, PA-C 05/10/21 2024

## 2021-05-10 NOTE — Discharge Instructions (Signed)
-  I will call if there is any sign of pneumonia on your chest x-ray or other concerning findings but I did not see any definite pneumonia. - COVID test obtained.  Results to be back tomorrow.  If positive need to be isolated 5 days.Then wear a mask x 5 days.  If you are positive for COVID we can send antiviral medication.  Someone will contact you. - If COVID is negative, as we discussed this could be related to a vaccine side effects versus a viral illness. - Supportive care advised with over-the-counter Tylenol or low-dose Motrin, rest and fluids.  Also over-the-counter Mucinex or other cough medication as needed. - If any fever, increased chest discomfort or breathing trouble need to be seen again.

## 2021-05-10 NOTE — Assessment & Plan Note (Addendum)
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  #However etiology of above inflammatory generalized lymphadenopathy is unclear.  Given the absence of any malignancy-limits any oncologic intervention/recommendation.  I would defer further work-up to PCP/pulmonary at this time.  Consider rheumatologic evaluation for any underlying autoimmune conditions.  Thank you Dr.Duncan for allowing me to participate in the care of your pleasant patient. Please do not hesitate to contact me with questions or concerns in the interim.   DISPOSITION: # labs today-CBC CMP LDH ACE levels.  Addendum: ACE normal; labs unremarkable.  Recommend continued follow-up with PCP.

## 2021-05-10 NOTE — Telephone Encounter (Signed)
Noted. Thanks.  Will await the UC notes.

## 2021-05-10 NOTE — Telephone Encounter (Signed)
Please call pt.  I talked with Dr. Rogue Bussing with hematology about her situation.   Since she didn't have a cause for the lymphadenopathy discovered with hematology, he thought it was reasonable to see if an inflammatory condition could be contributing and recommended her to see rheumatology.  I think this makes sense.  Please let me know if she consents and I can put in the referral to get this started.  Thanks.

## 2021-05-11 LAB — SARS CORONAVIRUS 2 (TAT 6-24 HRS): SARS Coronavirus 2: NEGATIVE

## 2021-05-11 NOTE — Telephone Encounter (Signed)
Called patient reviewed all information and repeated back to me. Will call if any questions.  She agrees with referral would like to be sent to rheumatology in [place]. Informed patient if she does not hear from their office in 2 weeks to let us know so that we can follow up on it for her.

## 2021-05-15 NOTE — Addendum Note (Signed)
Addended by: Tonia Ghent on: 05/15/2021 04:21 PM   Modules accepted: Orders

## 2021-05-15 NOTE — Telephone Encounter (Signed)
Referral placed  Thanks!

## 2021-05-16 NOTE — Patient Instructions (Signed)
Dear Kara Wright,  It was a pleasure meeting you during our initial appointment on May 09, 2021. Below is a summary of the goals we discussed and components of chronic care management. Please contact me anytime with questions or concerns.   Visit Information  Patient Care Plan: CCM Pharmacy Care Plan     Problem Identified: CHL AMB "PATIENT-SPECIFIC PROBLEM"      Long-Range Goal: Disease Management   Start Date: 05/09/2021  Priority: High  Note:   Current Barriers:  Suboptimal therapeutic regimen for osteoporosis Unable to achieve control of blood pressure  Pharmacist Clinical Goal(s):  Patient will contact provider office for questions/concerns as evidenced notation of same in electronic health record through collaboration with PharmD and provider.   Interventions: 1:1 collaboration with Kara Ghent, MD regarding development and update of comprehensive plan of care as evidenced by provider attestation and co-signature Inter-disciplinary care team collaboration (see longitudinal plan of care) Comprehensive medication review performed; medication list updated in electronic medical record  Hypertension (BP goal <140/90) -Not ideally controlled - pt reports BP may have been up recently due to stress with passing of husband, she has not checked home BP -Current treatment: Propranolol 120 mg - 1 tablet daily at bedtime  -Medications previously tried: none  -Current home readings: she has a home monitor, has not checked -Denies hypotensive/hypertensive symptoms -Wears CPAP routinely  -Educated on BP goals and benefits of medications for prevention of heart attack, stroke and kidney damage; -Counseled to monitor BP at home weekly, document, and provide log at future appointments -Recommended to continue current medication; Check home BP when rested. Call us with an update.  Hyperlipidemia: (LDL goal < 70) -Controlled - LDL within goal, pt affirms adherence -Current  treatment: Crestor 5 mg daily -Medications previously tried: none reported  -Current dietary patterns: not discussed -Current exercise habits: watches her grandbaby 2-3 days a week, stays active  -Educated on Cholesterol goals;  -Recommended to continue current medication  Atrial Fibrillation (Goal: prevent stroke and major bleeding) -Controlled -CHADSVASC: 6 This patients CHA2DS2-VASc Score and unadjusted Ischemic Stroke Rate (% per year) is equal to 9.7 % stroke rate/year from a score of 6 -Current treatment: Rate control: Propranolol daily, diltiazem PRN  Anticoagulation: Xarelto 20 mg daily -Medications previously tried: none reported -Home BP and HR readings: none reported  -Xarelto dose appropriate for renal function -Counseled on importance of adherence to anticoagulant exactly as prescribed; -Recommended to continue current medication  Sleep disturbance (Goal: Improve sleep maintenance, WASO) -New problem, Not ideally controlled - Pt reports vivid dreams (started after husband's passing 01/2021). These wake her up at night. She is unable to fall back asleep.  -Current treatment: None  -Medications previously tried/failed: melatonin has helped with sleep in the past -PHQ9: 0 (01/10/21) - pt denies anxiety/depression today -GAD7: none  -Educated on Benefits of medication for symptom control Benefits of cognitive-behavioral therapy with or without medication -She denies interested in grief counseling. She talks to her sister for support. -She is in bed from 11 PM to 8:30 AM most nights. May also wake up 2-3 times to urinate.  -Recommended limit caffeine as it may exacerbate vivid dreams. She may also try melatonin 3-5 mg nightly.    Hypothyroidism (Goal: TSH, T4 within goal) -Query Controlled (dose increased after last TSH mildly elevated) -Current treatment  Levothyroxine 150 mcg - 1 tablet daily and 2 on Sundays -Medications previously tried: none  -Pt affirms correct  dosing. Denies symptoms. -Recommended to continue  current medication  Osteoporosis / Osteopenia (Goal Improve bone density, prevent fractures) -Not ideally controlled -Last DEXA Scan: 07/20/20  -Pt reports she fell and broke her ribs a few months ago. She admits to poor balance, does not use support devices, just tries to be careful. No falls since this incident.   T-Score femoral neck: -2.2   T-Score total hip: n/a  T-Score lumbar spine: -2.3  T-Score forearm radius: n/a  10-year probability of major osteoporotic fracture: 31.4%  10-year probability of hip fracture: 16.1% -Patient is a candidate for pharmacologic treatment due to T-Score -1.0 to -2.5 and 10-year risk of major osteoporotic fracture > 20% and T-Score -1.0 to -2.5 and 10-year risk of hip fracture > 3%. Fosamax deferred for now per PCP visit 09/16/20 (pain, etc). -Current treatment  Vitamin D3 2000 IU - 2 caps daily -Medications previously tried: none  -Recommend [id number] units of vitamin D daily. Recommend 1200 mg of calcium daily from dietary and supplemental sources. Discussed medication treatment. I told her I would review DEXA and we would discuss if DEXA due. She actually had a recent DEXA and treatment recommended.  -Recommended to continue current medication; Recommend discussion of treatment options at follow up visit.  GERD (Goal: Improve symptoms) -Controlled - per patient report -Query safe, may increase risk of decrease bone density/increase risk of fractures   -Current treatment  Omeprazole 20 mg - 1 capsule daily -Medications previously tried: none  -Recommended to continue current medication; Consider taper off therapy if symptoms controlled without breakthrough.  Patient Goals/Self-Care Activities Patient will:  - take medications as prescribed as evidenced by patient report and record review check blood pressure this week, document, and provide at future appointments  Follow Up Plan: CCM follow up 1  month for osteoporosis/HTN     Kara Wright was given information about Chronic Care Management services today including:  CCM service includes personalized support from designated clinical staff supervised by her physician, including individualized plan of care and coordination with other care providers 24/7 contact phone numbers for assistance for urgent and routine care needs. Standard insurance, coinsurance, copays and deductibles apply for chronic care management only during months in which we provide at least 20 minutes of these services. Most insurances cover these services at 100%, however patients may be responsible for any copay, coinsurance and/or deductible if applicable. This service may help you avoid the need for more expensive face-to-face services. Only one practitioner may furnish and bill the service in a calendar month. The patient may stop CCM services at any time (effective at the end of the month) by phone call to the office staff.  Patient agreed to services and verbal consent obtained.   Patient verbalizes understanding of instructions provided today and agrees to view in [place].   [name], PharmD Clinical Pharmacist [facility] [address] [phone]

## 2021-05-17 ENCOUNTER — Telehealth: Payer: Self-pay | Admitting: Family Medicine

## 2021-05-17 MED ORDER — AMOXICILLIN-POT CLAVULANATE 875-125 MG PO TABS
1.0000 | ORAL_TABLET | Freq: Two times a day (BID) | ORAL | 0 refills | Status: DC
Start: 2021-05-17 — End: 2021-05-25

## 2021-05-17 NOTE — Telephone Encounter (Signed)
Pt called in stated she has ongoing cough and congestion  for over a week  neg. Covid test. Wants to know what can she do . Please Advise [phone]

## 2021-05-17 NOTE — Telephone Encounter (Signed)
Spoke with patient and advised on below. She will do as advised and will call back if she does not get any better in the augmentin.

## 2021-05-17 NOTE — Telephone Encounter (Signed)
Given the duration, would start augmentin.  Rx sent.   I would avoid albuterol given her h/o A fib.  Would keep taking mucinex and drinking plenty of fluid.   If not better soon, it would make sense to get checked here at clinic.  I hope she feels better soon. Thanks.

## 2021-05-17 NOTE — Telephone Encounter (Signed)
Patient states she mainly has the congestion now and coughing up mucus at night. She did have a fever but that is now gone. She had been taking tylenol, musinex and cough drops for her sx. Patient states she does not feel better or worse but feels the same as she did when she went to UC.

## 2021-05-17 NOTE — Telephone Encounter (Signed)
Please check with patient. Fever? Sputum? Wheeze? Overall worse, same, or is she slowly getting better? What did she due for the cough so far? Please let me know.  Thanks.

## 2021-05-17 NOTE — Telephone Encounter (Signed)
LMTCB

## 2021-05-17 NOTE — Addendum Note (Signed)
Addended by: Tonia Ghent on: 05/17/2021 05:02 PM   Modules accepted: Orders

## 2021-05-18 DIAGNOSIS — E7849 Other hyperlipidemia: Secondary | ICD-10-CM

## 2021-05-18 DIAGNOSIS — I4891 Unspecified atrial fibrillation: Secondary | ICD-10-CM | POA: Diagnosis not present

## 2021-05-18 DIAGNOSIS — M810 Age-related osteoporosis without current pathological fracture: Secondary | ICD-10-CM | POA: Diagnosis not present

## 2021-05-18 DIAGNOSIS — I10 Essential (primary) hypertension: Secondary | ICD-10-CM

## 2021-05-19 ENCOUNTER — Other Ambulatory Visit: Payer: PPO

## 2021-05-19 NOTE — Progress Notes (Signed)
Tumor Board Documentation  Anahy Esh was presented by Dr Rogue Bussing at our Tumor Board on 05/19/2021, which included representatives from medical oncology, radiation oncology, internal medicine, navigation, pathology, radiology, surgical, genetics, research, palliative care, pulmonology, pharmacy.  Kimyetta currently presents as a new patient, for [place] with history of the following treatments: active survellience, surgical intervention(s).  Additionally, we reviewed previous medical and familial history, history of present illness, and recent lab results along with all available histopathologic and imaging studies. The tumor board considered available treatment options and made the following recommendations:   Fu with PCP, Refer to Rheumatology  The following procedures/referrals were also placed: No orders of the defined types were placed in this encounter.   Clinical Trial Status: not discussed   Staging used: Not Applicable  National site-specific guidelines   were discussed with respect to the case.  Tumor board is a meeting of clinicians from various specialty areas who evaluate and discuss patients for whom a multidisciplinary approach is being considered. Final determinations in the plan of care are those of the provider(s). The responsibility for follow up of recommendations given during tumor board is that of the provider.   Today's extended care, comprehensive team conference, Jaquelin was not present for the discussion and was not examined.   Multidisciplinary Tumor Board is a multidisciplinary case peer review process.  Decisions discussed in the Multidisciplinary Tumor Board reflect the opinions of the specialists present at the conference without having examined the patient.  Ultimately, treatment and diagnostic decisions rest with the primary provider(s) and the patient.

## 2021-05-20 ENCOUNTER — Telehealth: Payer: Self-pay | Admitting: Internal Medicine

## 2021-05-20 NOTE — Telephone Encounter (Signed)
Patient's imaging/pathology reviewed at the tumor conference-chronic findings dating back to 2012.  Given the absence of any malignancy noted on the pathology-unlikely any malignant concerns noted.   Discussed with the patient; follow-up with [place] only as needed.  Consider rheumatology evaluation.  FYI-Dr.Duncan.  GB

## 2021-05-22 NOTE — Telephone Encounter (Signed)
Noted.  Rheumatology consult request placed previously.

## 2021-05-25 ENCOUNTER — Encounter: Payer: Self-pay | Admitting: Internal Medicine

## 2021-05-25 ENCOUNTER — Other Ambulatory Visit: Payer: Self-pay

## 2021-05-25 ENCOUNTER — Ambulatory Visit (INDEPENDENT_AMBULATORY_CARE_PROVIDER_SITE_OTHER): Payer: PPO | Admitting: Internal Medicine

## 2021-05-25 DIAGNOSIS — H25012 Cortical age-related cataract, left eye: Secondary | ICD-10-CM | POA: Diagnosis not present

## 2021-05-25 DIAGNOSIS — J988 Other specified respiratory disorders: Secondary | ICD-10-CM

## 2021-05-25 NOTE — Assessment & Plan Note (Signed)
Occurred day after multiple vaccines--unclear if just reaction to this Could have been flu or other viral infection Unclear if any bacterial infection--though seemed reasonable to try antibiotic ??Mycoplasma possible with the ear findings---but would not try macrolide unless she worsens  Stop the mucinex Can try OTC antihistamine prn Tylenol prn

## 2021-05-25 NOTE — Progress Notes (Signed)
Subjective:    Patient ID: Kara Wright, female    DOB: 1946/02/16, 75 y.o.   MRN: [id number]  HPI Here for follow up of urgent care visit ~2 weeks ago for respiratory symptoms  Was sent email to follow up from her urgent care visit Not back to normal  Had the 3 vaccines----Td, flu, COVID Awoke the next day with cough and runny nose Had chest pain from all the coughing---and headache  Reviewed urgent care visit COVID negative and CXR negative Told to continue guafenisin and tylenol  Ongoing bad congestion Rx with augmentin on 11/29 by Dr Damita Dunnings for persistent symptoms  Cough is better Still feels throat and chest congestion--some in nose Still tired and weak---limited in walking (like when in store)  Current Outpatient Medications on File Prior to Visit  Medication Sig Dispense Refill   albuterol (VENTOLIN HFA) 108 (90 Base) MCG/ACT inhaler Inhale 2 puffs into the lungs every 6 (six) hours as needed for wheezing or shortness of breath. 8 g 3   amoxicillin-clavulanate (AUGMENTIN) 875-125 MG tablet Take 1 tablet by mouth 2 (two) times daily. 20 tablet 0   Cholecalciferol (VITAMIN D3) 50 MCG (2000 UT) capsule TAKE TWO CAPSULES BY MOUTH DAILY 180 capsule 3   clobetasol ointment (TEMOVATE) 0.05 % Apply topically 2 (two) times daily.     diltiazem (CARDIZEM) 30 MG tablet TAKE ONE TABLET BY MOUTH THREE TIMES DAILY AS NEEDED 90 tablet 1   diphenhydrAMINE (BENADRYL) 25 MG tablet Take 25 mg by mouth every 8 (eight) hours as needed for itching.     folic acid (FOLVITE) 1 MG tablet Take 1 mg by mouth daily.     levothyroxine (SYNTHROID) 150 MCG tablet 1 pill a day except for 2 pills on Sundays. 110 tablet 3   loperamide (IMODIUM A-D) 2 MG tablet Take 2 mg by mouth 2 (two) times daily as needed for diarrhea or loose stools.     methotrexate 2.5 MG tablet TAKE 5 TABLETS BY MOUTH ONCE A WEEK - TAKE FOLIC ACID ALL OTHER DAYS OF THE WEEK     omeprazole (PRILOSEC) 20 MG capsule TAKE ONE  CAPSULE BY MOUTH DAILY 90 capsule 3   promethazine (PHENERGAN) 25 MG tablet TAKE ONE TABLET BY MOUTH EVERY 6 HOURS AS NEEDED FOR NAUSEA 30 tablet 3   propranolol ER (INDERAL LA) 120 MG 24 hr capsule TAKE ONE CAPSULE BY MOUTH AT BEDTIME 90 capsule 3   rosuvastatin (CRESTOR) 5 MG tablet Take 1 tablet (5 mg total) by mouth at bedtime. 90 tablet 3   traMADol (ULTRAM) 50 MG tablet Take 1-2 tablets (50-100 mg total) by mouth every 12 (twelve) hours as needed. CAUTION SEDATION 50 tablet 1   XARELTO 20 MG TABS tablet TAKE ONE TABLET BY MOUTH IN THE EVENING WITH SUPPER 90 tablet 3   No current facility-administered medications on file prior to visit.    Allergies  Allergen Reactions   Celebrex [Celecoxib]     REACTION: nausea: GI UPSET   Doxycycline Other (See Comments)    GI upset- not an allergy.     Gold-Containing Drug Products    Nickel     Skin rash, allergy   Other     Fragrance, phenylenediamine, contrast metal agents, gold   Sodium Metabisulfite Other (See Comments)    Positive patch test   Zocor [Simvastatin]     Aches but tolerates crestor    Past Medical History:  Diagnosis Date   Atrial fib/flutter, transient  1 episode, resolved, never on coumadin   Cataract 2019   left eye   Clotting disorder ([place])    on xarelto   Diverticulosis 03/1997   Dyspnea    Dysrhythmia    atrial fibrilation   GERD (gastroesophageal reflux disease) 1997   good on prilosec   Hepatitis    hx of hep B (late 1970s)   Hyperlipidemia 08/1996   Hypothyroidism 1984   Migraine    w/o aura   Osteopenia 11/2002   via dexa   Presence of dental prosthetic device    temporary while waiting for implant   Skin cancer    skin cancer   Sleep apnea    Stroke ([place]) 08/2011   TIA    Past Surgical History:  Procedure Laterality Date   ABDOMINAL HYSTERECTOMY     ANAL FISSURE REPAIR     CATARACT EXTRACTION W/PHACO Right 02/06/2018   Procedure: CATARACT EXTRACTION PHACO AND INTRAOCULAR LENS  PLACEMENT ([place]) RIGHT;  Surgeon: Leandrew Koyanagi, MD;  Location: [place];  Service: Ophthalmology;  Laterality: Right;   CHOLECYSTECTOMY  07/2006   LYMPH NODE BIOPSY Right 03/16/2021   Procedure: LYMPH NODE BIOPSY;  Surgeon: Katha Cabal, MD;  Location: [facility];  Service: Vascular;  Laterality: Right;   MOHS SURGERY  12/05/2006   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 Hulda Humphrey    Family History  Problem Relation Age of Onset   Hypertension Mother    Osteopenia Mother    Stroke Mother        deceased age 54   Colon cancer Father 73       mets   Allergies Sister    Breast cancer Cousin        pat cousin   Rectal cancer Neg Hx     Social History   Socioeconomic History   Marital status: Married    Spouse name: Not on file   Number of children: 2   Years of education: Not on file   Highest education level: Not on file  Occupational History   Occupation: Ecologist: RETIRED    Comment: 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     Married 1979   2 daughters from prev relationship   [employer] fan, [employer] fan.   Social Determinants of Health   Financial Resource Strain: Low Risk    Difficulty of Paying Living Expenses: Not very hard  Food Insecurity: Not on file   Transportation Needs: Not on file  Physical Activity: Not on file  Stress: Not on file  Social Connections: Not on file  Intimate Partner Violence: Not on file  Review of Systems Diarrhea throughout the illness--no worse with augmentin (loose-not watery). Sight increased frequency (2-3 times a day) Having vaginal itching---monistat cream helping Some trouble sleeping ---especially since husband died    Objective:   Physical Exam Constitutional:      Appearance: Normal appearance.  HENT:     Head:     Comments: No sinus tenderness    Right Ear: Tympanic membrane and ear canal normal.     Ears:     Comments: Left TM is bulging superiorly and slight inflamed (bullous myrigitis)    Nose:     Comments: Moderate nasal congestion    Mouth/Throat:     Pharynx: No oropharyngeal exudate or posterior oropharyngeal erythema.  Pulmonary:     Effort: Pulmonary effort is normal.     Breath sounds: Normal breath sounds. No wheezing or rales.  Musculoskeletal:     Cervical back: Neck supple.  Lymphadenopathy:     Cervical: No cervical adenopathy.  Neurological:     Mental Status: She is alert.           Assessment & Plan:

## 2021-05-26 ENCOUNTER — Ambulatory Visit (INDEPENDENT_AMBULATORY_CARE_PROVIDER_SITE_OTHER): Payer: PPO | Admitting: Vascular Surgery

## 2021-05-31 NOTE — Telephone Encounter (Signed)
error 

## 2021-06-02 ENCOUNTER — Ambulatory Visit (INDEPENDENT_AMBULATORY_CARE_PROVIDER_SITE_OTHER): Payer: PPO | Admitting: Vascular Surgery

## 2021-06-02 ENCOUNTER — Other Ambulatory Visit: Payer: Self-pay

## 2021-06-02 ENCOUNTER — Encounter (INDEPENDENT_AMBULATORY_CARE_PROVIDER_SITE_OTHER): Payer: Self-pay | Admitting: Vascular Surgery

## 2021-06-02 VITALS — BP 153/82 | HR 60 | Resp 16 | Wt 220.6 lb

## 2021-06-02 DIAGNOSIS — M96843 Postprocedural seroma of a musculoskeletal structure following other procedure: Secondary | ICD-10-CM | POA: Diagnosis not present

## 2021-06-02 DIAGNOSIS — I10 Essential (primary) hypertension: Secondary | ICD-10-CM | POA: Diagnosis not present

## 2021-06-02 DIAGNOSIS — R591 Generalized enlarged lymph nodes: Secondary | ICD-10-CM

## 2021-06-02 DIAGNOSIS — I25118 Atherosclerotic heart disease of native coronary artery with other forms of angina pectoris: Secondary | ICD-10-CM

## 2021-06-02 DIAGNOSIS — I480 Paroxysmal atrial fibrillation: Secondary | ICD-10-CM | POA: Diagnosis not present

## 2021-06-02 NOTE — Progress Notes (Signed)
MRN : [id number]  Kara Wright is a 75 y.o. ([birth year]) female who presents with chief complaint of check neck.  History of Present Illness:   Procedure 03/16/2021: Excisional lymph node biopsy right neck  Patient states that the area seems to be significantly smaller.  She still has some numbness over the shoulder.  She occasionally gets a kind of shooting pain but this seems self-limited.  She also notes there is a fair bit of itching.  She has seen the cancer center and they have discharged her.  The plan is now to follow-up with rheumatology.  No outpatient medications have been marked as taking for the 06/02/21 encounter (Appointment) with Delana Meyer, Dolores Lory, MD.    Past Medical History:  Diagnosis Date   Atrial fib/flutter, transient    1 episode, resolved, never on coumadin   Cataract 2019   left eye   Clotting disorder ([place])    on xarelto   Diverticulosis 03/1997   Dyspnea    Dysrhythmia    atrial fibrilation   GERD (gastroesophageal reflux disease) 1997   good on prilosec   Hepatitis    hx of hep B (late 1970s)   Hyperlipidemia 08/1996   Hypothyroidism 1984   Migraine    w/o aura   Osteopenia 11/2002   via dexa   Presence of dental prosthetic device    temporary while waiting for implant   Skin cancer    skin cancer   Sleep apnea    Stroke ([place]) 08/2011   TIA    Past Surgical History:  Procedure Laterality Date   ABDOMINAL HYSTERECTOMY     ANAL FISSURE REPAIR     CATARACT EXTRACTION W/PHACO Right 02/06/2018   Procedure: CATARACT EXTRACTION PHACO AND INTRAOCULAR LENS PLACEMENT ([place]) RIGHT;  Surgeon: Leandrew Koyanagi, MD;  Location: [place];  Service: Ophthalmology;  Laterality: Right;   CHOLECYSTECTOMY  07/2006   LYMPH NODE BIOPSY Right 03/16/2021   Procedure: LYMPH NODE BIOPSY;  Surgeon: Katha Cabal, MD;  Location: [facility];  Service: Vascular;  Laterality: Right;   MOHS SURGERY  12/05/2006   left middle finger SCC excision  of fingernail   nasolabial fold skin flap surgery  08/02/2007   [facility]   PARTIAL HYSTERECTOMY  1978-79   POLYPECTOMY  04/06/97   colon, benign by pathology   varicose vein ablation sclerotherapy  09/2008   Dr. Hulda Humphrey    Social History Social History   Tobacco Use   Smoking status: Former    Packs/day: 1.00    Types: Cigarettes    Quit date: 06/26/1986    Years since quitting: 34.9   Smokeless tobacco: Never  Vaping Use   Vaping Use: Never used  Substance Use Topics   Alcohol use: No   Drug use: No    Family History Family History  Problem Relation Age of Onset   Hypertension Mother    Osteopenia Mother    Stroke Mother        deceased age 89   Colon cancer Father 37       mets   Allergies Sister    Breast cancer Cousin        pat cousin   Rectal cancer Neg Hx     Allergies  Allergen Reactions   Celebrex [Celecoxib]     REACTION: nausea: GI UPSET   Doxycycline Other (See Comments)    GI upset- not an allergy.     Gold-Containing Drug Products    Nickel  Skin rash, allergy   Other     Fragrance, phenylenediamine, contrast metal agents, gold   Sodium Metabisulfite Other (See Comments)    Positive patch test   Zocor [Simvastatin]     Aches but tolerates crestor     REVIEW OF SYSTEMS (Negative unless checked)  Constitutional: [] Weight loss  [] Fever  [] Chills Cardiac: [] Chest pain   [] Chest pressure   [] Palpitations   [] Shortness of breath when laying flat   [] Shortness of breath with exertion. Vascular:  [] Pain in legs with walking   [] Pain in legs at rest  [] History of DVT   [] Phlebitis   [x] Swelling in legs   [] Varicose veins   [] Non-healing ulcers Pulmonary:   [] Uses home oxygen   [] Productive cough   [] Hemoptysis   [] Wheeze  [] COPD   [] Asthma Neurologic:  [] Dizziness   [] Seizures   [] History of stroke   [] History of TIA  [] Aphasia   [] Vissual changes   [] Weakness or numbness in arm   [] Weakness or numbness in leg Musculoskeletal:   [] Joint swelling    [] Joint pain   [] Low back pain Hematologic:  [] Easy bruising  [] Easy bleeding   [] Hypercoagulable state   [] Anemic Gastrointestinal:  [] Diarrhea   [] Vomiting  [] Gastroesophageal reflux/heartburn   [] Difficulty swallowing. Genitourinary:  [] Chronic kidney disease   [] Difficult urination  [] Frequent urination   [] Blood in urine Skin:  [] Rashes   [] Ulcers  Psychological:  [] History of anxiety   []  History of major depression.  Physical Examination  There were no vitals filed for this visit. There is no height or weight on file to calculate BMI. Gen: WD/WN, NAD Head: [place]/AT, No temporalis wasting.  Ear/Nose/Throat: Hearing grossly intact, nares w/o erythema or drainage, pinna without lesions Eyes: PER, EOMI, sclera nonicteric.  Neck: Supple, no gross masses.  No JVD.  Pulmonary:  Good air movement, no audible wheezing, no use of accessory muscles.  Cardiac: RRR, precordium not hyperdynamic. Vascular: Right shoulder incision is well-healed and looks quite nice cosmetically.  The previously noted prominent mass has decreased by more than 50%.  It is difficult to appreciate by palpation today. Vessel Right Left  Radial Palpable Palpable  Gastrointestinal: soft, non-distended. No guarding/no peritoneal signs.  Musculoskeletal: M/S 5/5 throughout.  No deformity.  Neurologic: CN 2-12 intact. Pain and light touch intact in extremities.  Symmetrical.  Speech is fluent. Motor exam as listed above. Psychiatric: Judgment intact, Mood & affect appropriate for pt's clinical situation. Dermatologic: Venous rashes no ulcers noted.  No changes consistent with cellulitis. Lymph : No lichenification or skin changes of chronic lymphedema.  CBC Lab Results  Component Value Date   WBC 6.1 04/07/2021   HGB 11.3 (L) 04/07/2021   HCT 36.0 04/07/2021   MCV 81.3 04/07/2021   PLT 170 04/07/2021    BMET    Component Value Date/Time   NA 139 04/07/2021 1231   NA 139 08/26/2011 1334   K 3.7 04/07/2021 1231    K 3.8 08/26/2011 1334   CL 101 04/07/2021 1231   CL 100 08/26/2011 1334   CO2 31 04/07/2021 1231   CO2 26 08/26/2011 1334   GLUCOSE 118 (H) 04/07/2021 1231   GLUCOSE 129 (H) 08/26/2011 1334   BUN 11 04/07/2021 1231   BUN 12 08/26/2011 1334   CREATININE 0.68 04/07/2021 1231   CREATININE 0.80 08/26/2011 1334   CALCIUM 8.8 (L) 04/07/2021 1231   CALCIUM 9.2 08/26/2011 1334   GFRNONAA >60 04/07/2021 1231   GFRNONAA >60 08/26/2011 1334  GFRAA >60 10/28/2016 0246   GFRAA >60 08/26/2011 1334   CrCl cannot be calculated (Patient's most recent lab result is older than the maximum 21 days allowed.).  COAG Lab Results  Component Value Date   INR 1.1 09/30/2020   INR 1.01 08/28/2011    Radiology DG Chest 2 View  Result Date: 05/10/2021 CLINICAL DATA:  Cough chest pain EXAM: CHEST - 2 VIEW COMPARISON:  08/22/2019, CT 07/12/2020 FINDINGS: No focal opacity or pleural effusion. Stable cardiomediastinal silhouette with enlarged central pulmonary vessels. Aortic atherosclerosis. No pneumothorax. IMPRESSION: No active cardiopulmonary disease. Enlarged central pulmonary arteries suggestive of arterial hypertension. Electronically Signed   By: Donavan Foil M.D.   On: 05/10/2021 19:22     Assessment/Plan 1. Lymphadenopathy of head and neck The lymph node biopsy site appears to be significantly improved.  It is almost unnoticeable.  The incision is healed well.  There was excellent news from the cancer center as this does not appear to be a malignancy and she will follow-up with rheumatology next.  No further intervention is recommended at this time no aspiration is recommended at this time.  I will follow-up with her in 4 months and my hope is that some of these nerve related complaints will have resolved  2. Postprocedural seroma of a musculoskeletal structure following other procedure See #1  3. Paroxysmal atrial fibrillation (HCC) Continue antiarrhythmia medications as already ordered,  these medications have been reviewed and there are no changes at this time.  Continue anticoagulation as ordered by Cardiology Service   4. Primary hypertension Continue antihypertensive medications as already ordered, these medications have been reviewed and there are no changes at this time.   5. Coronary artery disease of native heart with stable angina pectoris, unspecified vessel or lesion type ([place]) Continue cardiac and antihypertensive medications as already ordered and reviewed, no changes at this time.  Continue statin as ordered and reviewed, no changes at this time  Nitrates PRN for chest pain     [name], MD  06/02/2021 12:18 PM

## 2021-06-24 ENCOUNTER — Telehealth: Payer: Self-pay

## 2021-06-24 NOTE — Progress Notes (Signed)
° ° °  Chronic Care Management Pharmacy Assistant   Name: Kara Wright  MRN: [id number] DOB: 12/31/1945  Reason for Encounter: CCM (Appointment Reminder)   Medications: Outpatient Encounter Medications as of 06/24/2021  Medication Sig   albuterol (VENTOLIN HFA) 108 (90 Base) MCG/ACT inhaler Inhale 2 puffs into the lungs every 6 (six) hours as needed for wheezing or shortness of breath.   Cholecalciferol (VITAMIN D3) 50 MCG (2000 UT) capsule TAKE TWO CAPSULES BY MOUTH DAILY   clobetasol ointment (TEMOVATE) 0.05 % Apply topically 2 (two) times daily.   diltiazem (CARDIZEM) 30 MG tablet TAKE ONE TABLET BY MOUTH THREE TIMES DAILY AS NEEDED   folic acid (FOLVITE) 1 MG tablet Take 1 mg by mouth daily.   levothyroxine (SYNTHROID) 150 MCG tablet 1 pill a day except for 2 pills on Sundays.   loperamide (IMODIUM A-D) 2 MG tablet Take 2 mg by mouth 2 (two) times daily as needed for diarrhea or loose stools.   methotrexate 2.5 MG tablet TAKE 5 TABLETS BY MOUTH ONCE A WEEK - TAKE FOLIC ACID ALL OTHER DAYS OF THE WEEK   omeprazole (PRILOSEC) 20 MG capsule TAKE ONE CAPSULE BY MOUTH DAILY   promethazine (PHENERGAN) 25 MG tablet TAKE ONE TABLET BY MOUTH EVERY 6 HOURS AS NEEDED FOR NAUSEA   propranolol ER (INDERAL LA) 120 MG 24 hr capsule TAKE ONE CAPSULE BY MOUTH AT BEDTIME   rosuvastatin (CRESTOR) 5 MG tablet Take 1 tablet (5 mg total) by mouth at bedtime.   traMADol (ULTRAM) 50 MG tablet Take 1-2 tablets (50-100 mg total) by mouth every 12 (twelve) hours as needed. CAUTION SEDATION   XARELTO 20 MG TABS tablet TAKE ONE TABLET BY MOUTH IN THE EVENING WITH SUPPER   No facility-administered encounter medications on file as of 06/24/2021.   Lanier Felty was contacted to remind of upcoming telephone visit with [name] on 06/30/2021 at 1:00 pm. Patient was reminded to have all medications, supplements and any blood glucose and blood pressure readings available for review at appointment. If unable to  reach, a voicemail was left for patient.   Star Rating Drugs: Medication:  Last Fill: Day Supply Rosuvastatin 5 mg 12/192022 30  Fill date verified with [place], CPP notified  [name], [place] Pharmacy Assistant [phone]

## 2021-06-27 ENCOUNTER — Telehealth: Payer: PPO

## 2021-06-28 ENCOUNTER — Ambulatory Visit
Admission: RE | Admit: 2021-06-28 | Discharge: 2021-06-28 | Disposition: A | Discharge status: Home / Self Care | Payer: PPO | Source: Ambulatory Visit | Attending: Family Medicine | Admitting: Family Medicine

## 2021-06-28 ENCOUNTER — Other Ambulatory Visit: Payer: Self-pay

## 2021-06-28 DIAGNOSIS — Z1231 Encounter for screening mammogram for malignant neoplasm of breast: Secondary | ICD-10-CM | POA: Diagnosis not present

## 2021-06-29 DIAGNOSIS — Z114 Encounter for screening for human immunodeficiency virus [HIV]: Secondary | ICD-10-CM | POA: Diagnosis not present

## 2021-06-29 DIAGNOSIS — R591 Generalized enlarged lymph nodes: Secondary | ICD-10-CM | POA: Diagnosis not present

## 2021-06-30 ENCOUNTER — Other Ambulatory Visit: Payer: Self-pay

## 2021-06-30 ENCOUNTER — Ambulatory Visit (INDEPENDENT_AMBULATORY_CARE_PROVIDER_SITE_OTHER): Payer: PPO | Admitting: Pharmacist

## 2021-06-30 ENCOUNTER — Telehealth: Payer: Self-pay | Admitting: Pharmacist

## 2021-06-30 DIAGNOSIS — M810 Age-related osteoporosis without current pathological fracture: Secondary | ICD-10-CM

## 2021-06-30 DIAGNOSIS — E039 Hypothyroidism, unspecified: Secondary | ICD-10-CM

## 2021-06-30 DIAGNOSIS — I10 Essential (primary) hypertension: Secondary | ICD-10-CM

## 2021-06-30 DIAGNOSIS — E7849 Other hyperlipidemia: Secondary | ICD-10-CM

## 2021-06-30 NOTE — Progress Notes (Signed)
Chronic Care Management Pharmacy Note  05/09/2021 Name:  Kara Wright MRN:  [id number] DOB:  [birth year]  Summary: -BP at rheumatology appt yesterday 112/82 -Last DEXA 07/2020 showed worsening T score and high FRAX - anti-resorptive therapy is recommended; pt previously deferred Fosamax but today agrees to start medication -Annual PCP visit due March 2023; pt needs repeat TSH in particular (levothyroxine was increased 08/2020 and TSH has not been repeated yet)  Recommendations/Changes made from today's visit: -Recommend Alendronate 70 mg weekly (see telephone note) -Recommend repeat TSH; Advised pt to schedule annual PCP visit March 2023  Plan: -[facility] will call patient 1 month for alendronate tolerability -Pharmacist follow up televisit scheduled for 6 months   Subjective: Kara Wright is an 76 y.o. year old female who is a primary patient of Damita Dunnings, Elveria Rising, MD.  The CCM team was consulted for assistance with disease management and care coordination needs.    Engaged with patient by telephone for follow up visit in response to provider referral for pharmacy case   Consent to Services:  The patient was given information about Chronic Care Management services, agreed to services, and gave verbal consent prior to initiation of services.  Please see initial visit note for detailed documentation.   Patient Care Team: Tonia Ghent, MD as PCP - General (Family Medicine) Rockey Situ Kathlene November, MD as PCP - Cardiology (Cardiology) Dasher, Rayvon Char, MD as Consulting Physician (Dermatology) Minna Merritts, MD as Consulting Physician (Cardiology) Ocie Doyne, OD as Consulting Physician (Optometry) Dale [place], DDS as Consulting Physician (Dentistry) Charlton Haws, [facility] as Pharmacist (Pharmacist)  Recent office visits:  05/25/21 Dr Silvio Pate OV: respiratory infxn, UC f/u - stop mucinex. Try antihistamine, tylenol.  01/10/2021 - Elsie Stain, MD - Patient  presented for neck swelling. Possible lipoma will get MRI, check with radiology. No medication changes. 12/22/2020 - Family Medicine - Patient presented for a rash on bilateral upper and lower extremities.Stop taking Benadryl.Start Zyrtec 10 mg once nightly and famotidine (Pepcid) 20 mg once nightly for itching.  09/28/2020 - Elsie Stain, MD - Patient presented for right arm pain, x-rays, splint no medication changes.   Recent consult visits:  06/29/21 Dr Posey Pronto [facility]): eval lymphadenopathy - checking labs. RTC 3 weeks.  06/02/21 Dr Delana Meyer (vasc surg): f/u lymphadenopathy - much improved. No further intervention.  05/22/21 Tumor Board - no evidence of malignancy. Rec Rheumatology eval  05/10/2021 [place] [facility]: c/o cough, body aches. Sx day after covid booster, flu shot, TDAP vaccine. Covid negative. CXR negative.  04/07/2021 - [place] - Patient presented for Lymphadenopathy. Labs: CBC, CMP, Angiotensin Converting Enzyme and Lactate Dehydrogenase. No medication changes.  03/31/2021 - Vascular Surgery - Patient presented for 2 week [facility] post lymph node biopsy. 03/07/2021 - Vascular Surgery - Patient presented for new referral for lymphadenopathy. MR discussed, and biopsy planned,hold xarelto for 2 days. 09/23/2020 - Cardiology - Patient presented for follow up paroxysmal atrial fibrillation. Most recent electrolytes at goal with renal function stable and H&H stable, no medication changes follow up 1 year.   Hospital visits:  03/16/21 [facility] - Patient presented for lymph node biopsy.  Objective:  Lab Results  Component Value Date   CREATININE 0.68 04/07/2021   BUN 11 04/07/2021   GFR 87.91 09/13/2020   GFRNONAA >60 04/07/2021   GFRAA >60 10/28/2016   NA 139 04/07/2021   K 3.7 04/07/2021   CALCIUM 8.8 (L) 04/07/2021   CO2 31 04/07/2021   GLUCOSE  118 (H) 04/07/2021    Lab Results  Component Value Date/Time   HGBA1C 5.6 09/13/2020 09:45 AM   HGBA1C 5.5  08/13/2019 10:11 AM   GFR 87.91 09/13/2020 09:45 AM   GFR 89.34 08/13/2019 10:11 AM   Lab Results  Component Value Date   CHOL 110 09/13/2020   HDL 34.70 (L) 09/13/2020   LDLCALC 45 09/13/2020   LDLDIRECT 94.9 06/15/2011   TRIG 154.0 (H) 09/13/2020   CHOLHDL 3 09/13/2020    Hepatic Function Latest Ref Rng & Units 04/07/2021 08/09/2020 08/13/2019  Total Protein 6.5 - 8.1 g/dL 6.8 7.5 6.1  Albumin 3.5 - 5.0 g/dL 3.8 4.1 3.7  AST 15 - 41 U/L 17 23 14   ALT 0 - 44 U/L 13 18 12   Alk Phosphatase 38 - 126 U/L 73 82 68  Total Bilirubin 0.3 - 1.2 mg/dL 1.0 1.0 0.7  Bilirubin, Direct 0.1 - 0.5 mg/dL - - -    Lab Results  Component Value Date/Time   TSH 5.50 (H) 09/13/2020 09:45 AM   TSH 1.60 08/13/2019 10:11 AM   FREET4 0.70 06/07/2010 09:03 AM   FREET4 1.2 05/17/2009 12:00 AM    CBC Latest Ref Rng & Units 04/07/2021 03/15/2021 09/30/2020  WBC 4.0 - 10.5 K/uL 6.1 6.8 6.6  Hemoglobin 12.0 - 15.0 g/dL 11.3(L) 11.3(L) 12.2  Hematocrit 36.0 - 46.0 % 36.0 35.5(L) 38.3  Platelets 150 - 400 K/uL 170 171 172   Lab Results  Component Value Date/Time   VD25OH 36.15 09/13/2020 09:45 AM   VD25OH 32.68 08/13/2019 10:11 AM   Clinical ASCVD: Yes  The ASCVD Risk score (Arnett DK, et al., 2019) failed to calculate for the following reasons:   The valid total cholesterol range is 130 to 320 mg/dL    Depression screen [facility] 2/9 01/10/2021 08/21/2019 08/08/2018  Decreased Interest 0 0 0  Down, Depressed, Hopeless 0 0 0  PHQ - 2 Score 0 0 0  Altered sleeping - 0 0  Tired, decreased energy - 0 0  Change in appetite - 0 0  Feeling bad or failure about yourself  - 0 0  Trouble concentrating - 0 0  Moving slowly or fidgety/restless - 0 0  Suicidal thoughts - 0 0  PHQ-9 Score - 0 0  Difficult doing work/chores - Not difficult at all Not difficult at all  Some recent data might be hidden    Social History   Tobacco Use  Smoking Status Former   Packs/day: 1.00   Types: Cigarettes   Quit date:  06/26/1986   Years since quitting: 35.0  Smokeless Tobacco Never   BP Readings from Last 3 Encounters:  06/02/21 (!) 153/82  05/25/21 138/84  05/10/21 (!) 141/69   Pulse Readings from Last 3 Encounters:  06/02/21 60  05/25/21 61  05/10/21 76   Wt Readings from Last 3 Encounters:  06/02/21 220 lb 9.6 oz (100.1 kg)  05/25/21 219 lb (99.3 kg)  04/07/21 220 lb (99.8 kg)   BMI Readings from Last 3 Encounters:  06/02/21 37.87 kg/m  05/25/21 37.59 kg/m  04/07/21 37.76 kg/m    Assessment/Interventions: Review of patient past medical history, allergies, medications, health status, including review of consultants reports, laboratory and other test data, was performed as part of comprehensive evaluation and provision of chronic care management services.   SDOH:  (Social Determinants of Health) assessments and interventions performed: Yes   SDOH Screenings   Alcohol Screen: Not on file  Depression (NTZ0-0): Low Risk  PHQ-2 Score: 0  Financial Resource Strain: Low Risk    Difficulty of Paying Living Expenses: Not very hard  Food Insecurity: Not on file  Housing: Not on file  Physical Activity: Not on file  Social Connections: Not on file  Stress: Not on file  Tobacco Use: Medium Risk   Smoking Tobacco Use: Former   Smokeless Tobacco Use: Never   Passive Exposure: Not on file  Transportation Needs: Not on file    CCM Care Plan  Allergies  Allergen Reactions   Celebrex [Celecoxib]     REACTION: nausea: GI UPSET   Doxycycline Other (See Comments)    GI upset- not an allergy.     Gold-Containing Drug Products    Nickel     Skin rash, allergy   Other     Fragrance, phenylenediamine, contrast metal agents, gold   Sodium Metabisulfite Other (See Comments)    Positive patch test   Zocor [Simvastatin]     Aches but tolerates crestor    Medications Reviewed Today     Reviewed by Charlton Haws, [place] (Pharmacist) on 06/30/21 at 1315  Med List Status: <None>    Medication Order Taking? Sig Documenting Provider Last Dose Status Informant  albuterol (VENTOLIN HFA) 108 (90 Base) MCG/ACT inhaler [id number] Yes Inhale 2 puffs into the lungs every 6 (six) hours as needed for wheezing or shortness of breath. Garner Nash, DO Taking Active   Cholecalciferol (VITAMIN D3) 50 MCG (2000 UT) capsule [id number] Yes TAKE TWO CAPSULES BY MOUTH DAILY Tonia Ghent, MD Taking Active   clobetasol ointment (TEMOVATE) 0.05 % [id number] Yes Apply topically 2 (two) times daily. [provider] Taking Active   diltiazem (CARDIZEM) 30 MG tablet [id number] Yes TAKE ONE TABLET BY MOUTH THREE TIMES DAILY AS NEEDED Tonia Ghent, MD Taking Active   folic acid (FOLVITE) 1 MG tablet [id number] Yes Take 1 mg by mouth daily. [provider] Taking Active   levothyroxine (SYNTHROID) 150 MCG tablet [id number] Yes 1 pill a day except for 2 pills on Sundays. Tonia Ghent, MD Taking Active   loperamide (IMODIUM A-D) 2 MG tablet [id number] Yes Take 2 mg by mouth 2 (two) times daily as needed for diarrhea or loose stools. [provider] Taking Active   methotrexate 2.5 MG tablet [id number] Yes TAKE 5 TABLETS BY MOUTH ONCE A WEEK - TAKE FOLIC ACID ALL OTHER DAYS OF THE WEEK [provider] Taking Active   omeprazole (PRILOSEC) 20 MG capsule [id number] Yes TAKE ONE CAPSULE BY MOUTH DAILY Tonia Ghent, MD Taking Active   promethazine (PHENERGAN) 25 MG tablet [id number] Yes TAKE ONE TABLET BY MOUTH EVERY 6 HOURS AS NEEDED FOR NAUSEA Tonia Ghent, MD Taking Active   propranolol ER (INDERAL LA) 120 MG 24 hr capsule [id number] Yes TAKE ONE CAPSULE BY MOUTH AT BEDTIME Tonia Ghent, MD Taking Active   rosuvastatin (CRESTOR) 5 MG tablet [id number] Yes Take 1 tablet (5 mg total) by mouth at bedtime. Tonia Ghent, MD Taking Active   traMADol Veatrice [place]bon) 50 MG tablet [id number] Yes Take 1-2 tablets (50-100 mg total) by mouth every 12 (twelve) hours as  needed. 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 06/02/2021   Neck mass 01/12/2021   Right wrist pain 09/29/2020   Lymphadenopathy of head and neck 09/29/2020   Hearing loss 09/28/2020   Chest wall pain 09/19/2020   Sleep apnea 01/19/2020   Lung nodule 01/19/2020   SUI (stress urinary incontinence, female) 08/24/2019   Itching 08/24/2019   SOB (shortness of breath) 08/24/2019   Exposure to 2019 novel coronavirus 09/18/2018   Cough 10/28/2017   Health care maintenance 08/13/2017   Vitamin D deficiency 04/10/2017   Hip pain 04/10/2017   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[place]) 11/01/2016   Low back pain 08/04/2016   Dysuria 09/02/2015   Morbid obesity ([place]) 04/05/2015   Advance care planning 08/07/2014   Anxiety 07/16/2012   Esophagitis 04/17/2012   TIA (transient ischemic attack) 08/27/2011   Respiratory infection 07/13/2011   Medicare annual wellness visit, subsequent 06/25/2011   Atrial fibrillation ([place]) 10/31/2010   HTN (hypertension) 10/31/2010   VARICOSE VEINS, LOWER EXTREMITIES 08/10/2008   Hypothyroidism 12/15/2006   HLD (hyperlipidemia) 12/15/2006   MIGRAINE HEADACHE 12/15/2006   GERD (gastroesophageal reflux disease) 12/15/2006   DIVERTICULOSIS, COLON 12/15/2006   Osteoporosis 12/15/2006    Immunization History  Administered Date(s) Administered   Fluad Quad(high Dose 65+) 03/21/2019   Influenza Split 04/13/2011, 04/26/2012   Influenza Whole 04/07/2009, 04/13/2010   Influenza, High Dose Seasonal PF 04/09/2017, 04/18/2018   Influenza,inj,Quad PF,6+ Mos 05/01/2013, 04/30/2014, 04/05/2015, 06/05/2016   Influenza-Unspecified 04/10/2020,  05/06/2021   PFIZER(Purple Top)SARS-COV-2 Vaccination 08/06/2019, 08/27/2019, 08/03/2020   Pfizer Covid-19 Vaccine Bivalent Booster 9yr & up 05/06/2021   Pneumococcal Conjugate-13 08/06/2014   Pneumococcal Polysaccharide-23 07/16/2012   Td 04/04/2006, 02/20/2012   Tdap 05/06/2021   Zoster Recombinat (Shingrix) 03/18/2020, 08/03/2020   Zoster, Live 05/02/2007    Conditions to be addressed/monitored:  Hypertension, Hyperlipidemia, Atrial Fibrillation, GERD, Hypothyroidism, and Osteoporosis  Care Plan : [place] Updates made by FCharlton Haws RCliosince 06/30/2021 12:00 AM     Problem: Hypertension, Hyperlipidemia, Atrial Fibrillation, GERD, Hypothyroidism, and Osteoporosis   Priority: High     Long-Range Goal: Disease Management   Start Date: 05/09/2021  Expected End Date: 06/30/2022  This Visit's Progress: On track  Priority: High  Note:   Current Barriers:  Unable to independently monitor therapeutic efficacy Suboptimal therapeutic regimen for Osteoporosis  Pharmacist Clinical Goal(s):  Patient will achieve adherence to monitoring guidelines and medication adherence to achieve therapeutic efficacy adhere to plan to optimize therapeutic regimen for Osteoporosis as evidenced by report of adherence to recommended medication management changes through collaboration with PharmD and provider.   Interventions: 1:1 collaboration with DTonia Ghent MD regarding development and update of comprehensive plan of care as evidenced by provider attestation and co-signature Inter-disciplinary care team collaboration (see longitudinal plan of care) Comprehensive medication review performed; medication list updated in electronic medical record  Hypertension (BP goal <140/90) -Controlled- BP was at goal at office visit yesterday (112/82); she has not been checking BP at home -Hx OSA on CPAP; endorses compliance with CPAP -Current treatment: Propranolol ER 120 mg  daily HS - Appropriate (given hx migraines), Effective, Safe, Accessible -Medications previously tried: none  -Educated on BP goals and benefits of medications for prevention of heart attack, stroke and kidney damage; -Counseled to monitor BP at home weekly, -Recommended to continue current medication  Hyperlipidemia / CAD / hx TIA: (LDL goal < 70) -Controlled - LDL within goal, pt  affirms adherence -Hx CAD; 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) -Current treatment: Rosuvastatin 5 mg daily - Appropriate, Effective, Safe, Accessible -Medications previously tried: none reported  -Current dietary patterns: not discussed -Current exercise habits: watches her grandbaby 2-3 days a week, stays active  -Educated on Cholesterol goals;  -Recommended to continue current medication  Atrial Fibrillation (Goal: prevent stroke and major bleeding) -Controlled -CHADSVASC: 6 -Current treatment: Propranolol ER 120 mg daily - Appropriate, Effective, Safe, Accessible Diltiazem  30 mg PRN - Appropriate, Effective, Safe, Accessible Xarelto 20 mg daily - Appropriate, Effective, Safe, Accessible -Xarelto dose appropriate for renal function -Counseled on importance of adherence to anticoagulant exactly as prescribed; -Recommended to continue current medication  Sleep disturbance (Goal: Improve sleep maintenance, WASO) -Improved - Pt reports vivid dreams (started after husband's passing 01/2021). These wake her up at night. She is unable to fall back asleep. She denies interested in grief counseling. She talks to her sister for support. -She is in bed from 11 PM to 8:30 AM most nights. May also wake up 2-3 times to urinate 06/30/21 update: pt reports improved sleep, she did not try melatonin as recommended but seems to be doing fine without it -PHQ9: 0 (01/10/21) - pt denies anxiety/depression today -GAD7: not on file -Advised to contact provider if she needs help with sleep in future  Hypothyroidism (Goal: TSH, T4 within  goal) -Unable to assess - TSH was elevated 08/2020 and dose was increased, pt has not had repeat TSH since; she is taking levothyroxine 30 min before food/other meds -Current treatment  Levothyroxine 150 mcg - 1 tablet daily and 2 on Sundays - Appropriate, Query Effective (repeat TSH due), Safe, Accessible -Recommended to continue current medication; repeat TSH due  Osteoporosis (Goal Improve bone density, prevent fractures) -Not ideally controlled - she has discussed fosamax with PCP 08/2020 but deferred due to multiple other issues at the time -Pt reports she fell and broke her ribs Fall 2022; She admits to poor balance, does not use support devices, just tries to be careful. No falls since this incident.  -Last DEXA Scan: 07/20/20   T-Score femoral neck: -2.2   T-Score total hip: n/a  T-Score lumbar spine: -2.3  T-Score forearm radius: n/a  10-year probability of major osteoporotic fracture: 31.4%  10-year probability of hip fracture: 16.1% -Patient is a candidate for pharmacologic treatment due to T-Score -1.0 to -2.5 and 10-year risk of major osteoporotic fracture > 20% and T-Score -1.0 to -2.5 and 10-year risk of hip fracture > 3% -Current treatment  Vitamin D3 2000 IU - 2 caps daily -Medications previously tried: none  -Discussed previous DEXA scan and indication for treatment; pt agreed to start treatment today -Counseled on oral bisphosphonate administration: take in the morning, 30 minutes prior to food with 6-8 oz of water. Do not lie down for at least 30 minutes after taking. -Recommend Alendronate 70 mg weekly  GERD (Goal: Improve symptoms) -Controlled - per patient report  -Current treatment  Omeprazole 20 mg - 1 capsule daily -Appropriate, Effective, Query Safe (bone density) -Discussed chronic PPI can lead to reduced bone density and should attempt to taper; given we are about to start alendronate, would defer PPI taper until after she is stable on bisphosphonate -Recommended  to continue current medication  Health Maintenance -Vaccine gaps: none -Pt is undergoing workup for lymphadenopathy on neck- has seen oncology, vascular, pulmonary and now seeing rheumatology -Pt has been on methotrexate 12.5 mg weekly for rash/dermatitis per dermatology  Patient Goals/Self-Care Activities Patient  will:  - take medications as prescribed as evidenced by patient report and record review      Medication Assistance: None required.  Patient affirms current coverage meets needs. Discussed Xarelto, she may apply next year. Able to afford at this time.  Compliance/Adherence/Medication fill history: Care Gaps: None  Star-Rating Drugs: Medication:                Last Fill:         Day Supply Rosuvastatin 6m       06/06/21     30 PDC 99%  Patient's preferred pharmacy is: [facility] [place] [id number]: [phone]: [id number]Phone: [phone]: [phone] B[facility]vis Lemmings([facility] - pill packs  All medications come through mail/packs except folic acid, methotrexate, and tramadol [facility] Uses pill box? Yes Pt endorses 100% compliance  We discussed: Benefits of medication synchronization, packaging and delivery as well as enhanced pharmacist oversight with [employer]. Not discussed as patient is doing well with current delivery/pill packs.  Care Plan and Follow Up Patient Decision:  Patient agrees to Care Plan and Follow-up.  Follow Up Plan: Telephone follow up appointment with care management team member scheduled for: 6 months  LCharlene Brooke PharmD, BCACP Clinical Pharmacist [facility] CityPrimary [phone]

## 2021-06-30 NOTE — Patient Instructions (Addendum)
Visit Information  Phone number for Pharmacist: [phone]   Goals Addressed             This Visit's Progress    Prevent Falls and Broken Bones-Osteoporosis       Timeframe:  Long-Range Goal Priority:  High Start Date:            06/30/21                 Expected End Date:        06/30/22               Follow Up Date July 2023   - always use handrails on the stairs - always wear shoes or slippers with non-slip sole - keep cell phone with me always - make an emergency alert plan in case I fall - pick up clutter from the floors - use a cane or walker - use a nightlight in the bathroom    Why is this important?   When you fall, there are 3 things that control if a bone breaks or not.  These are the fall itself, how hard and the direction that you fall and how fragile your bones are.  Preventing falls is very important for you because of fragile bones.     Notes:         Care Plan : [place]  Updates made by Charlton Haws, RPH since 06/30/2021 12:00 AM     Problem: Hypertension, Hyperlipidemia, Atrial Fibrillation, GERD, Hypothyroidism, and Osteoporosis   Priority: High     Long-Range Goal: Disease Management   Start Date: 05/09/2021  Expected End Date: 06/30/2022  This Visit's Progress: On track  Priority: High  Note:   Current Barriers:  Unable to independently monitor therapeutic efficacy Suboptimal therapeutic regimen for Osteoporosis  Pharmacist Clinical Goal(s):  Patient will achieve adherence to monitoring guidelines and medication adherence to achieve therapeutic efficacy adhere to plan to optimize therapeutic regimen for Osteoporosis as evidenced by report of adherence to recommended medication management changes through collaboration with PharmD and provider.   Interventions: 1:1 collaboration with Tonia Ghent, MD regarding development and update of comprehensive plan of care as evidenced by provider attestation and  co-signature Inter-disciplinary care team collaboration (see longitudinal plan of care) Comprehensive medication review performed; medication list updated in electronic medical record  Hypertension (BP goal <140/90) -Controlled- BP was at goal at office visit yesterday (112/82); she has not been checking BP at home -Hx OSA on CPAP; endorses compliance with CPAP -Current treatment: Propranolol ER 120 mg daily HS - Appropriate (given hx migraines), Effective, Safe, Accessible -Medications previously tried: none  -Educated on BP goals and benefits of medications for prevention of heart attack, stroke and kidney damage; -Counseled to monitor BP at home weekly, -Recommended to continue current medication  Hyperlipidemia / CAD / hx TIA: (LDL goal < 70) -Controlled - LDL within goal, pt affirms adherence -Hx CAD; 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) -Current treatment: Rosuvastatin 5 mg daily - Appropriate, Effective, Safe, Accessible -Medications previously tried: none reported  -Current dietary patterns: not discussed -Current exercise habits: watches her grandbaby 2-3 days a week, stays active  -Educated on Cholesterol goals;  -Recommended to continue current medication  Atrial Fibrillation (Goal: prevent stroke and major bleeding) -Controlled -CHADSVASC: 6 -Current treatment: Propranolol ER 120 mg daily - Appropriate, Effective, Safe, Accessible Diltiazem  30 mg PRN - Appropriate, Effective, Safe, Accessible Xarelto 20 mg daily - Appropriate, Effective, Safe, Accessible -Xarelto  dose appropriate for renal function -Counseled on importance of adherence to anticoagulant exactly as prescribed; -Recommended to continue current medication  Sleep disturbance (Goal: Improve sleep maintenance, WASO) -Improved - Pt reports vivid dreams (started after husband's passing 01/2021). These wake her up at night. She is unable to fall back asleep. She denies interested in grief counseling. She talks to her sister  for support. -She is in bed from 11 PM to 8:30 AM most nights. May also wake up 2-3 times to urinate 06/30/21 update: pt reports improved sleep, she did not try melatonin as recommended but seems to be doing fine without it -PHQ9: 0 (01/10/21) - pt denies anxiety/depression today -GAD7: not on file -Advised to contact provider if she needs help with sleep in future  Hypothyroidism (Goal: TSH, T4 within goal) -Unable to assess - TSH was elevated 08/2020 and dose was increased, pt has not had repeat TSH since; she is taking levothyroxine 30 min before food/other meds -Current treatment  Levothyroxine 150 mcg - 1 tablet daily and 2 on Sundays - Appropriate, Query Effective (repeat TSH due), Safe, Accessible -Recommended to continue current medication; repeat TSH due  Osteoporosis (Goal Improve bone density, prevent fractures) -Not ideally controlled - she has discussed fosamax with PCP 08/2020 but deferred due to multiple other issues at the time -Pt reports she fell and broke her ribs Fall 2022; She admits to poor balance, does not use support devices, just tries to be careful. No falls since this incident.  -Last DEXA Scan: 07/20/20   T-Score femoral neck: -2.2   T-Score total hip: n/a  T-Score lumbar spine: -2.3  T-Score forearm radius: n/a  10-year probability of major osteoporotic fracture: 31.4%  10-year probability of hip fracture: 16.1% -Patient is a candidate for pharmacologic treatment due to T-Score -1.0 to -2.5 and 10-year risk of major osteoporotic fracture > 20% and T-Score -1.0 to -2.5 and 10-year risk of hip fracture > 3% -Current treatment  Vitamin D3 2000 IU - 2 caps daily -Medications previously tried: none  -Discussed previous DEXA scan and indication for treatment; pt agreed to start treatment today -Counseled on oral bisphosphonate administration: take in the morning, 30 minutes prior to food with 6-8 oz of water. Do not lie down for at least 30 minutes after  taking. -Recommend Alendronate 70 mg weekly  GERD (Goal: Improve symptoms) -Controlled - per patient report  -Current treatment  Omeprazole 20 mg - 1 capsule daily -Appropriate, Effective, Query Safe (bone density) -Discussed chronic PPI can lead to reduced bone density and should attempt to taper; given we are about to start alendronate, would defer PPI taper until after she is stable on bisphosphonate -Recommended to continue current medication  Health Maintenance -Vaccine gaps: none -Pt is undergoing workup for lymphadenopathy on neck- has seen oncology, vascular, pulmonary and now seeing rheumatology -Pt has been on methotrexate 12.5 mg weekly for rash/dermatitis per dermatology  Patient Goals/Self-Care Activities Patient will:  - take medications as prescribed as evidenced by patient report and record review      Patient verbalizes understanding of instructions and care plan provided today and agrees to view in [place]. Active MyChart status confirmed with patient.   Telephone follow up appointment with pharmacy team member scheduled for: 6 months  [name], PharmD, [facility] Clinical Pharmacist [facility] at [facility] [phone]

## 2021-06-30 NOTE — Telephone Encounter (Signed)
Last DEXA scan 07/2020 showed worsening T score and high FRAX - anti-resorptive therapy is recommended; pt previously deferred Fosamax but today agrees to start medication. She has been counseled on Fosamax administration and side effects.  Last DEXA Scan: 07/20/20   T-Score femoral neck: -2.2   T-Score lumbar spine: -2.3  10-year probability of major osteoporotic fracture: 31.4%  10-year probability of hip fracture: 16.1%  Recommend alendronate 70 mg weekly. Fowarding to PCP for approval.  Pharmacy: [facility] [place], [place] - [address] [place] [address] Phone: [phone] Fax: [phone]

## 2021-07-01 MED ORDER — ALENDRONATE SODIUM 70 MG PO TABS: 70.0000 mg | ORAL_TABLET | ORAL | 3 refills | Status: DC

## 2021-07-01 NOTE — Addendum Note (Signed)
Addended by: Tonia Ghent on: 07/01/2021 07:45 AM   Modules accepted: Orders

## 2021-07-01 NOTE — Telephone Encounter (Signed)
Patient notified rx was sent and advised to contact Dr. Damita Dunnings if she develops any dysphagia, abd discomfort or increase GERD on med.

## 2021-07-01 NOTE — Telephone Encounter (Addendum)
Noted. Thanks. Sent.  Please have her update me if any dysphagia or abdominal discomfort/inc in GERD on med.

## 2021-07-19 DIAGNOSIS — E7849 Other hyperlipidemia: Secondary | ICD-10-CM

## 2021-07-19 DIAGNOSIS — M810 Age-related osteoporosis without current pathological fracture: Secondary | ICD-10-CM

## 2021-07-19 DIAGNOSIS — I10 Essential (primary) hypertension: Secondary | ICD-10-CM | POA: Diagnosis not present

## 2021-07-19 DIAGNOSIS — E039 Hypothyroidism, unspecified: Secondary | ICD-10-CM

## 2021-07-21 ENCOUNTER — Ambulatory Visit: Payer: PPO | Attending: Infectious Diseases | Admitting: Infectious Diseases

## 2021-07-21 ENCOUNTER — Encounter: Payer: Self-pay | Admitting: Infectious Diseases

## 2021-07-21 ENCOUNTER — Other Ambulatory Visit: Payer: Self-pay

## 2021-07-21 ENCOUNTER — Other Ambulatory Visit
Admission: RE | Admit: 2021-07-21 | Discharge: 2021-07-21 | Disposition: A | Discharge status: Home / Self Care | Payer: PPO | Attending: Infectious Diseases | Admitting: Infectious Diseases

## 2021-07-21 ENCOUNTER — Telehealth: Payer: Self-pay

## 2021-07-21 VITALS — BP 157/88 | HR 71 | Temp 97.6°F | Wt 224.0 lb

## 2021-07-21 DIAGNOSIS — E669 Obesity, unspecified: Secondary | ICD-10-CM | POA: Diagnosis not present

## 2021-07-21 DIAGNOSIS — Z8701 Personal history of pneumonia (recurrent): Secondary | ICD-10-CM | POA: Insufficient documentation

## 2021-07-21 DIAGNOSIS — E039 Hypothyroidism, unspecified: Secondary | ICD-10-CM | POA: Insufficient documentation

## 2021-07-21 DIAGNOSIS — Z79890 Hormone replacement therapy (postmenopausal): Secondary | ICD-10-CM | POA: Diagnosis not present

## 2021-07-21 DIAGNOSIS — Z8673 Personal history of transient ischemic attack (TIA), and cerebral infarction without residual deficits: Secondary | ICD-10-CM | POA: Insufficient documentation

## 2021-07-21 DIAGNOSIS — R161 Splenomegaly, not elsewhere classified: Secondary | ICD-10-CM | POA: Insufficient documentation

## 2021-07-21 DIAGNOSIS — R590 Localized enlarged lymph nodes: Secondary | ICD-10-CM | POA: Insufficient documentation

## 2021-07-21 DIAGNOSIS — X58XXXD Exposure to other specified factors, subsequent encounter: Secondary | ICD-10-CM | POA: Insufficient documentation

## 2021-07-21 DIAGNOSIS — Z9989 Dependence on other enabling machines and devices: Secondary | ICD-10-CM | POA: Diagnosis not present

## 2021-07-21 DIAGNOSIS — Z9181 History of falling: Secondary | ICD-10-CM | POA: Insufficient documentation

## 2021-07-21 DIAGNOSIS — Z6838 Body mass index (BMI) 38.0-38.9, adult: Secondary | ICD-10-CM | POA: Insufficient documentation

## 2021-07-21 DIAGNOSIS — S2231XD Fracture of one rib, right side, subsequent encounter for fracture with routine healing: Secondary | ICD-10-CM | POA: Insufficient documentation

## 2021-07-21 DIAGNOSIS — Z8616 Personal history of COVID-19: Secondary | ICD-10-CM | POA: Diagnosis not present

## 2021-07-21 DIAGNOSIS — G4733 Obstructive sleep apnea (adult) (pediatric): Secondary | ICD-10-CM | POA: Insufficient documentation

## 2021-07-21 DIAGNOSIS — Q828 Other specified congenital malformations of skin: Secondary | ICD-10-CM | POA: Diagnosis not present

## 2021-07-21 DIAGNOSIS — I4891 Unspecified atrial fibrillation: Secondary | ICD-10-CM | POA: Insufficient documentation

## 2021-07-21 DIAGNOSIS — R918 Other nonspecific abnormal finding of lung field: Secondary | ICD-10-CM | POA: Diagnosis not present

## 2021-07-21 DIAGNOSIS — R591 Generalized enlarged lymph nodes: Secondary | ICD-10-CM | POA: Insufficient documentation

## 2021-07-21 NOTE — Progress Notes (Signed)
Chronic Care Management Pharmacy Assistant   Name: Kara Wright  MRN: [id number] DOB: 07/12/1945  Reason for Encounter: CCM (General Adherence)   Recent office visits:  None since last CCM contact  Recent consult visits:  07/21/2021 - Kara Billing, MD - Infectious Diseases - Orders: Bartonella antibody, CMV DNA, RPR, Toxoplasma antibodies and Fungal antibodies.  07/04/2021 - Kara Patel, MD - Rheumatology - Patient presented for elevated EBV antibody titer quantitative. Referral to Infectious Disease.   Hospital visits:  None since last CCM contact  Medications: Outpatient Encounter Medications as of 07/21/2021  Medication Sig   albuterol (VENTOLIN HFA) 108 (90 Base) MCG/ACT inhaler Inhale 2 puffs into the lungs every 6 (six) hours as needed for wheezing or shortness of breath.   alendronate (FOSAMAX) 70 MG tablet Take 1 tablet (70 mg total) by mouth every 7 (seven) days. Take with a full glass of water on an empty stomach.   Cholecalciferol (VITAMIN D3) 50 MCG (2000 UT) capsule TAKE TWO CAPSULES BY MOUTH DAILY   clobetasol ointment (TEMOVATE) 0.05 % Apply topically 2 (two) times daily.   diltiazem (CARDIZEM) 30 MG tablet TAKE ONE TABLET BY MOUTH THREE TIMES DAILY AS NEEDED   folic acid (FOLVITE) 1 MG tablet Take 1 mg by mouth daily.   levothyroxine (SYNTHROID) 150 MCG tablet 1 pill a day except for 2 pills on Sundays.   loperamide (IMODIUM A-D) 2 MG tablet Take 2 mg by mouth 2 (two) times daily as needed for diarrhea or loose stools.   methotrexate 2.5 MG tablet TAKE 5 TABLETS BY MOUTH ONCE A WEEK - TAKE FOLIC ACID ALL OTHER DAYS OF THE WEEK   omeprazole (PRILOSEC) 20 MG capsule TAKE ONE CAPSULE BY MOUTH DAILY   promethazine (PHENERGAN) 25 MG tablet TAKE ONE TABLET BY MOUTH EVERY 6 HOURS AS NEEDED FOR NAUSEA   propranolol ER (INDERAL LA) 120 MG 24 hr capsule TAKE ONE CAPSULE BY MOUTH AT BEDTIME   rosuvastatin (CRESTOR) 5 MG tablet Take 1 tablet (5 mg total) by mouth at  bedtime.   traMADol (ULTRAM) 50 MG tablet Take 1-2 tablets (50-100 mg total) by mouth every 12 (twelve) hours as needed. CAUTION SEDATION   XARELTO 20 MG TABS tablet TAKE ONE TABLET BY MOUTH IN THE EVENING WITH SUPPER   No facility-administered encounter medications on file as of 07/21/2021.   Contacted Kara Wright on 07/21/2021 for general disease state and medication adherence call.   Patient is not more than 5 days past due for refill on the following medications per chart history:  Star Medications: Medication Name/mg Last Fill Days Supply Rosuvastatin 5 mg  07/06/2021 30  What concerns do you have about your medications? No concerns. Patient just stared taking Alendronate 70 mg on Monday, 07/18/2021. So far patient is not having any side effects or concerns.   The patient denies side effects with their medications.   How often do you forget or accidentally miss a dose? Never  Do you use a pillbox? No - Patient uses pill packs.   Are you having any problems getting your medications from your pharmacy? No  Has the cost of your medications been a concern? No  Since last visit with CPP, no interventions have been made.   The patient has not had an ED visit since last contact.   The patient denies problems with their health.   Patient denies concerns or questions for Kara Wright, PharmD at this time.   Counseled patient on:  Great job taking medications  Care Gaps: Annual wellness visit in last year? Yes 09/16/2020 Most Recent BP reading: 157/88 on 07/21/2021  Upcoming appointments: No appointments scheduled within the next 30 days.  Kara Wright, CPP notified  Kara Wright, [place] Clinical Pharmacy Assistant [phone]  Time Spent: 30 Minutes

## 2021-07-21 NOTE — Progress Notes (Signed)
NAME: Shirlene Andaya  DOB: 11-04-1945  MRN: [id number]  Date/Time: 07/21/2021 9:43 AM  Subjective:  Referred to me by Dr.Patel Rheumatologist for high EBV antibodies and lymphadenopathy ? Aerika Groll is a 76 y.o. female with a history of Afib, OSA, hypothyroidism, prior TIA Is referred to me for a high EBV antibodies as part of work up for lymphadenopathy, She has also been having fatigue. Pt says this all started in  may 2018 when she had a resp illness and was at [facility] for 8 days and diagnosed as multilobar bronchopneumonia . Left upper lobe nodule was seen on CT chest . Her PCP was doing follow up Cts She went to [place] in April 2019 and was fine May 2019 colonoscopy Aug 2019 cataract surgery Feb 2020 pulmonary consult March 2020 COVID Sept 2020- PFT restrictive pattern thought to be due to obesity hypovntialtion syndrome Sleep medicine and diagnosed with OSA - CPAP since sept 2020 09/02/19 a repeat CT high resolution ordered by pulmonary showed Multiple new bilateral pulmonary nodules  worrisome for metastatic disease. Hence a PET scan was ordered 09/11/19 PET scan Hypermetabolic activity within the left upper lobe pulmonary nodule 2. Pelvic adenopathy and retroperitoneal adenopathy with hypermetabolic features.   09/12/20 had a Fall and [facility] ED 09/13/20 CT abd and pelvis There was retroperitoneal adenopathy.  left para-aortic lymph node which measures 2.2 x 1.4 cm. There is additional left iliac chain adenopathy. For example, there is a left external iliac lymph node which measures approximately 0.9 x 1.3 cm. There is nonspecific retroperitoneal adenopathy and mild splenomegaly  09/19/20 CTA- --Minimally displaced right anterolateral seventh rib fracture of indeterminate age. No pneumothorax.   --Mild dilation of the main pulmonary artery up to 3.4 cm, which can be seen with pulmonary arterial hypertension.   09/30/20  underwent retroperitoneal lymphnode biopsy by IR Pathology showed  Core biopsy sections display lymphoid tissue with an apparent intact follicular architecture.  Scattered nonnecrotizing epithelioid granulomas are identified within the sampling.  Only minimal fibrosis is present.  No abnormal large cell population is identified. Concurrent flow cytometric studies show no monoclonal B cells and no atypical T-cell population is detected.   AFB and GMS special stains are negative for acid fast and fungal organisms, respectively. She then noted swelling on the supraclavicular area and On 03/16/21 underwent excisional biopsy of left supraclavicular node by vascular surgeon- HPE showed LYMPH NODES WITH VASCULAR TRANSFORMATION OF THE SINUSES (NODAL  ANGIOMATOSIS).  - NEGATIVE FOR GRANULOMATOUS INFLAMMATION AND MALIGNANCY (0/11).  - CONGO RED STAIN IS NEGATIVE;  She saw heme onc Dr.Brahmanday on 04/07/21 and because of waxing and waning pulmonary nodules and 2 LN biopsies not showing any malignancy he referred her to Rheumatology to r/o autoimmune conditions. She saw Dr.Patel on 06/29/21 and he ordered extensive workup including ANA, CCP, Rh factor, ANCA, Immunoglobulin, Ig4, serum electrophoresis, complements, CRP, ESR, quant gold, EBV/CMV HIV, hepatitis The result showed a high EBV  VCA IgG > 600 and EBNA IgG >500, IgA was low ( 34 ) N 64-422 Because of high EBV antibody she is referred to me She has no cough, fever, nausea, vomiting diarrhea. No weight loss Had 2 cats before No travel No outdoor activities   Past Medical History:  Diagnosis Date   Atrial fib/flutter, transient    1 episode, resolved, never on coumadin   Cataract 2019   left eye   Clotting disorder ([place])    on xarelto   Diverticulosis 03/1997  Dyspnea    Dysrhythmia    atrial fibrilation   GERD (gastroesophageal reflux disease) 1997   good on prilosec   Hepatitis    hx of hep B (late 1970s)   Hyperlipidemia 08/1996   Hypothyroidism 1984   Migraine    w/o aura   Osteopenia 11/2002   via  dexa   Presence of dental prosthetic device    temporary while waiting for implant   Skin cancer    skin cancer   Sleep apnea    Stroke ([place]) 08/2011   TIA    Past Surgical History:  Procedure Laterality Date   ABDOMINAL HYSTERECTOMY     ANAL FISSURE REPAIR     CATARACT EXTRACTION W/PHACO Right 02/06/2018   Procedure: CATARACT EXTRACTION PHACO AND INTRAOCULAR LENS PLACEMENT ([place]) RIGHT;  Surgeon: Leandrew Koyanagi, MD;  Location: [facility] [facility];  Service: Ophthalmology;  Laterality: Right;   CHOLECYSTECTOMY  07/2006   LYMPH NODE BIOPSY Right 03/16/2021   Procedure: LYMPH NODE BIOPSY;  Surgeon: Katha Cabal, MD;  Location: [facility];  Service: Vascular;  Laterality: Right;   MOHS SURGERY  12/05/2006   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 Hulda Humphrey    Social History   Socioeconomic History   Marital status: Widowed    Spouse name: Not on file   Number of children: 2   Years of education: Not on file   Highest education level: Not on file  Occupational History   Occupation: Ecologist: RETIRED    Comment: 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     Married 1979---widowed 10/22   2 daughters from prev relationship   [employer] fan, [employer] fan.   Social Determinants of Health    Financial Resource Strain: Low Risk    Difficulty of Paying Living Expenses: Not very hard  Food Insecurity: Not on file  Transportation Needs: Not on file  Physical Activity: Not on file  Stress: Not on file  Social Connections: Not on file  Intimate Partner Violence: Not on file    Family History  Problem Relation Age of Onset   Hypertension Mother    Osteopenia Mother    Stroke Mother        deceased age 65   Colon cancer Father 55       mets   Allergies Sister    Breast cancer Cousin        pat cousin   Rectal cancer Neg Hx    Allergies  Allergen Reactions   Celebrex [Celecoxib]     REACTION: nausea: GI UPSET   Doxycycline Other (See Comments)    GI upset- not  an allergy.     Gold-Containing Drug Products    Nickel     Skin rash, allergy   Other     Fragrance, phenylenediamine, contrast metal agents, gold   Sodium Metabisulfite Other (See Comments)    Positive patch test   Zocor [Simvastatin]     Aches but tolerates crestor   I? Current Outpatient Medications  Medication Sig Dispense Refill   albuterol (VENTOLIN HFA) 108 (90 Base) MCG/ACT inhaler Inhale 2 puffs into the lungs every 6 (six) hours as needed for wheezing or shortness of breath. 8 g 3   alendronate (FOSAMAX) 70 MG tablet Take 1 tablet (70 mg total) by mouth every 7 (seven) days. Take with a full glass of water on an empty stomach. 13 tablet 3   Cholecalciferol (VITAMIN D3) 50 MCG (2000 UT) capsule TAKE TWO CAPSULES BY MOUTH DAILY 180 capsule 3   clobetasol ointment (TEMOVATE) 0.05 % Apply topically 2 (two) times daily.     diltiazem (CARDIZEM) 30 MG tablet TAKE ONE TABLET BY MOUTH THREE TIMES DAILY AS NEEDED 90 tablet 1   folic acid (FOLVITE) 1 MG tablet Take 1 mg by mouth daily.     levothyroxine (SYNTHROID) 150 MCG tablet 1 pill a day except for 2 pills on Sundays. 110 tablet 3   loperamide (IMODIUM A-D) 2 MG tablet Take 2 mg by mouth 2 (two) times daily as needed for diarrhea or loose stools.      methotrexate 2.5 MG tablet TAKE 5 TABLETS BY MOUTH ONCE A WEEK - TAKE FOLIC ACID ALL OTHER DAYS OF THE WEEK     omeprazole (PRILOSEC) 20 MG capsule TAKE ONE CAPSULE BY MOUTH DAILY 90 capsule 3   promethazine (PHENERGAN) 25 MG tablet TAKE ONE TABLET BY MOUTH EVERY 6 HOURS AS NEEDED FOR NAUSEA 30 tablet 3   propranolol ER (INDERAL LA) 120 MG 24 hr capsule TAKE ONE CAPSULE BY MOUTH AT BEDTIME 90 capsule 3   rosuvastatin (CRESTOR) 5 MG tablet Take 1 tablet (5 mg total) by mouth at bedtime. 90 tablet 3   traMADol (ULTRAM) 50 MG tablet Take 1-2 tablets (50-100 mg total) by mouth every 12 (twelve) hours as needed. CAUTION SEDATION 50 tablet 1   XARELTO 20 MG TABS tablet TAKE ONE TABLET BY MOUTH IN THE EVENING WITH SUPPER 90 tablet 3   No current facility-administered medications for this visit.     Abtx:  Anti-infectives (From admission, onward)    None       REVIEW OF SYSTEMS:  Const: negative fever, negative chills, negative weight loss Eyes: negative diplopia or visual changes, negative eye pain ENT: negative coryza, negative sore throat Resp: negative cough, hemoptysis, some dyspnea on exertion Cards: negative for chest pain, palpitations, lower extremity edema GU: negative for frequency, dysuria and hematuria GI: Negative for abdominal pain, diarrhea, bleeding, constipation Skin: negative for rash and pruritus Heme: negative for easy bruising and gum/nose bleeding MS: fatigue Neurolo:negative for headaches, dizziness, vertigo, memory problems  Psych: negative for feelings of anxiety, depression  Endocrine has hypothyroidism Allergy/Immunology- allergies as reported Objective:  VITALS:  BP (!) 157/88    Pulse 71    Temp 97.6 F (36.4 C) (Oral)    Wt 224 lb (101.6 kg)    BMI 38.45 kg/m   Wt 224 lb (101.6 kg)    BMI 38.45 kg/m  PHYSICAL EXAM:  General: Alert, cooperative, no distress, appears stated age. obese Head: Normocephalic, without obvious abnormality,  atraumatic. Eyes: Conjunctivae clear, anicteric  sclerae. Pupils are equal ENT Nares normal. No drainage or sinus tenderness. Lips, mucosa, and tongue normal. No Thrush Neck: supraclavicular pad of fat no carotid bruit and no JVD. Back: No CVA tenderness. Lungs: Clear to auscultation bilaterally. No Wheezing or Rhonchi. No rales. Heart: irregular well controlled Abdomen: Soft, non-tender,not distended. Bowel sounds normal. No masses Extremities: atraumatic, no cyanosis. No edema. No clubbing Skin: No rashes or lesions. Or bruising Lymph: Cervical, supraclavicular normal. Neurologic: Grossly non-focal Pertinent Labs ANA, CCP, Rh factor, ANCA, Immunoglobulin, Ig4, serum electrophoresis, complements, CRP, ESR, quant gold, EBV/CMV HIV, hepatitis    IMAGING RESULTS: See above Reviewed Cts and PET I have personally reviewed the films ? Impression/Recommendation ?76 yr female with constellation of findings  imaging/pathology/clinical  Lymphadenopathy ( retroperitoneal/supraclavicular) ?Waxing and waning pulmonary nodules Biopsy of LN - nodal anginosis of the supraclavicular node and  Noncaseating epitheloid granuloma of the retroperitoneum Unclear etiology. ? kikuchi ANA, CCP, Rh factor, ANCA, Immunoglobulin, Ig4, serum electrophoresis, complements, CRP, ESR, quant gold, /CMV /HIV, hepatitis- negative  High EBV IgG antibodies but not IgM- not indicative of ongoing EBV infection. Will get EBV DNA CMV DNA Fungal antibodies RPR Bartonella Toxoplasma  Afib on diltiazem  Hypothyroidism-on synthroid  Increased BMI   ? ___________________________________________________ Discussed with patient in detail Will let her know of the results Follow PRN Note:  This document was prepared using Dragon voice recognition software and may include unintentional dictation errors.

## 2021-07-21 NOTE — Patient Instructions (Signed)
You have been referred to me for lymphadenopathy and high EBV titer- today I am doing a few blood work- will discuss with you when we get the results

## 2021-07-22 LAB — BARTONELLA ANTIBODY PANEL
B Quintana IgM: NEGATIVE titer
B henselae IgG: NEGATIVE titer
B henselae IgM: NEGATIVE titer
B quintana IgG: NEGATIVE titer

## 2021-07-22 LAB — RPR: RPR Ser Ql: NONREACTIVE

## 2021-07-23 LAB — CMV DNA, QUANTITATIVE, PCR
CMV DNA Quant: NEGATIVE IU/mL
Log10 CMV Qn DNA Pl: UNDETERMINED log10 IU/mL

## 2021-07-23 LAB — TOXOPLASMA ANTIBODIES- IGG AND  IGM
Toxoplasma Antibody- IgM: <3 AU/mL (ref 0.0–7.9)
Toxoplasma IgG Ratio: <3 IU/mL (ref 0.0–7.1)

## 2021-07-24 LAB — MISC LABCORP TEST (SEND OUT): Labcorp test code: 164284

## 2021-07-25 LAB — MISC LABCORP TEST (SEND OUT): Labcorp test code: 138230

## 2021-07-26 ENCOUNTER — Telehealth: Payer: Self-pay

## 2021-07-26 NOTE — Telephone Encounter (Signed)
-----   Message from Tsosie Billing, MD sent at 07/26/2021 12:52 PM EST ----- Please let the patient know that Infectious Disease labs done for work up of lymphadenopathy is negative . Even the EBV virus DNA PCR was negative- so no cause of infection found. Dr. Posey Pronto EBV not the cause for her lymphadenopathy ----- Message ----- From: Interface, Lab In [place] Sent: 07/22/2021  11:53 AM EST To: Tsosie Billing, MD

## 2021-07-26 NOTE — Telephone Encounter (Signed)
Patient informed of lab results and verbalized understanding. Patient had no questions. Richardson Shawnte Winton, CMA

## 2021-07-27 LAB — MISC LABCORP TEST (SEND OUT): Labcorp test code: 139310

## 2021-08-02 DIAGNOSIS — R591 Generalized enlarged lymph nodes: Secondary | ICD-10-CM | POA: Diagnosis not present

## 2021-08-02 DIAGNOSIS — M7918 Myalgia, other site: Secondary | ICD-10-CM | POA: Diagnosis not present

## 2021-08-05 ENCOUNTER — Other Ambulatory Visit: Payer: Self-pay | Admitting: Family Medicine

## 2021-08-15 DIAGNOSIS — X32XXXA Exposure to sunlight, initial encounter: Secondary | ICD-10-CM | POA: Diagnosis not present

## 2021-08-15 DIAGNOSIS — D2271 Melanocytic nevi of right lower limb, including hip: Secondary | ICD-10-CM | POA: Diagnosis not present

## 2021-08-15 DIAGNOSIS — L570 Actinic keratosis: Secondary | ICD-10-CM | POA: Diagnosis not present

## 2021-08-15 DIAGNOSIS — D2261 Melanocytic nevi of right upper limb, including shoulder: Secondary | ICD-10-CM | POA: Diagnosis not present

## 2021-08-15 DIAGNOSIS — L2084 Intrinsic (allergic) eczema: Secondary | ICD-10-CM | POA: Diagnosis not present

## 2021-08-15 DIAGNOSIS — Z79899 Other long term (current) drug therapy: Secondary | ICD-10-CM | POA: Diagnosis not present

## 2021-08-15 DIAGNOSIS — Z85828 Personal history of other malignant neoplasm of skin: Secondary | ICD-10-CM | POA: Diagnosis not present

## 2021-08-15 DIAGNOSIS — D2262 Melanocytic nevi of left upper limb, including shoulder: Secondary | ICD-10-CM | POA: Diagnosis not present

## 2021-09-20 ENCOUNTER — Telehealth: Payer: Self-pay

## 2021-09-20 ENCOUNTER — Other Ambulatory Visit: Payer: Self-pay | Admitting: Primary Care

## 2021-09-20 ENCOUNTER — Encounter: Payer: Self-pay | Admitting: Primary Care

## 2021-09-20 ENCOUNTER — Ambulatory Visit (INDEPENDENT_AMBULATORY_CARE_PROVIDER_SITE_OTHER): Payer: PPO | Admitting: Primary Care

## 2021-09-20 VITALS — BP 130/80 | HR 104 | Temp 97.9°F | Ht 64.0 in | Wt 216.6 lb

## 2021-09-20 DIAGNOSIS — R197 Diarrhea, unspecified: Secondary | ICD-10-CM

## 2021-09-20 DIAGNOSIS — R112 Nausea with vomiting, unspecified: Secondary | ICD-10-CM

## 2021-09-20 NOTE — Telephone Encounter (Addendum)
Joelene Millin RN with access nurse called to see if available appt at [facility] within 3 hours. Pt started on 09/18/21 with diarrhea, chills and H/A. Pt has not taken temp. Joelene Millin said that pt is able to drink liquids and has been urinating appropriately. No covid testing. Scheduled appt with Gentry Fitz NP on 09/20/21 at 3 PM with pt arriving at 2:45. UC & ED precautions given and Joelene Millin voiced understanding. Sending note to Gentry Fitz NP and [facility] CMA. When access nurse note is available will attach to this note.  ? ? ?[facility] - Client ?TELEPHONE ADVICE RECORD ?AccessNurse? ?Patient ?Name: ?Kara Wright ?LER ?Gender: Female ?DOB: 07/16/1945 ?Age: 76 Y 1 M 24 D ?Return ?Phone ?Number: ?[id number] ?(Primary), ?[id number] ?(Secondary) ?Address: [address] ?City/ ?State/ ?Zip: ?[place] ? [address] ?Client [facility] - Client ?Client Site [facility] ?Provider Renford Dills - MD ?Contact Type Call ?Who Is Calling Patient / Member / Family / Caregiver ?Call Type Triage / Clinical ?Relationship To Patient Self ?Return Phone Number [phone] (Primary) ?Chief Complaint Vomiting ?Reason for Call Symptomatic / Request for Health Information ?Initial Comment Cindy from the office transferred a patient ?to be triaged. She said there are no available ?appointments today to be seen She is experiencing ?diarrhea, vomiting and Headaches She have chills ?Translation No ?Nurse Assessment ?Nurse: Breeding, RN, [place] Date/Time (Eastern Time): 09/20/2021 11:31:27 AM ?Confirm and document reason for call. If ?symptomatic, describe symptoms. ?---Caller notes she is experiencing diarrhea, vomiting, ?chills and headaches. Caller notes s/s started at 9:30 on ?Sunday. No vomiting today. Caller denies any fever at ?this time. Caller denies any other s/s at this time. ?Does the patient have any new or worsening ?symptoms? ---Yes ?Will a triage be completed?  ---Yes ?Related visit to physician within the last 2 weeks? ---No ?Does the PT have any chronic conditions? (i.e. ?diabetes, asthma, this includes High risk factors for ?pregnancy, etc.) ?---Yes ?List chronic conditions. ---Afib ?Is this a behavioral health or substance abuse call? ---No ?Guidelines ?Guideline Title Affirmed Question Affirmed Notes Nurse Date/Time (Eastern ?Time) ?Diarrhea [1] SEVERE diarrhea ?(e.g., 7 or more ?times / day more than ?normal) AND [2] age ?> 61 years ?Breeding, Therapist, sports, ?[place] ?09/20/2021 11:33:42 ?AM ?Disp. Time (Eastern ?Time) Disposition Final User ?PLEASE NOTE: All timestamps contained within this report are represented as [place] Standard Time. ?CONFIDENTIALTY NOTICE: This fax transmission is intended only for the addressee. It contains information that is legally privileged, confidential or ?otherwise protected from use or disclosure. If you are not the intended recipient, you are strictly prohibited from reviewing, disclosing, copying using ?or disseminating any of this information or taking any action in reliance on or regarding this information. If you have received this fax in error, please ?notify us immediately by telephone so that we can arrange for its return to [place]. Phone: [phone], Toll-Free: [phone], Fax: [phone] ?Page: 2 of 2 ?Call Id: [id number] ?09/20/2021 11:38:10 AM See HCP within 4 Hours (or ?PCP triage) ?Yes Breeding, RN, [place] ?Caller Disagree/Comply Comply ?Caller Understands Yes ?PreDisposition Home Care ?Care Advice Given Per Guideline ?SEE HCP (OR PCP TRIAGE) WITHIN 4 HOURS: * IF OFFICE WILL BE OPEN: You need to be seen within the next 3 or 4 ?hours. Call your doctor (or NP/PA) now or as soon as the office opens. CLEAR FLUIDS: * Drink more fluids. * Sip water or a halfstrength sports drink (e.g., Gatorade, Powerade; mix half and  half with water). CALL BACK IF: * You become worse ?Comments ?User: Kara Reichmann, RN Date/Time [name] Time):  09/20/2021 11:44:34 AM ?Called backline and caller was able to make an appointment for 3:00pm today with Dr. Carlis Abbott. Advised caller of ?appointment. She verbalized understanding. ?Referrals ?REFERRED TO PCP OFFIC ?

## 2021-09-20 NOTE — Assessment & Plan Note (Signed)
Symptoms represent viral illness at this point, especially since her grandson had similar symptoms. ? ?Long discussion today regarding the importance of proper hydration and risks for dehydration given her medical conditions. ? ?Overall she appears stable.  Discussed to switch to low calorie Gatorade, water, chicken broth if tolerated. ? ?Checking labs today including CBC, CMP, stool studies. Will include C-diff given recent antibiotic use.  ? ?We will call to check on her tomorrow, we also discussed strict ED precautions if she gets worse overnight. ? ?Use promethazine as needed. ?

## 2021-09-20 NOTE — Progress Notes (Signed)
? ?Subjective:  ? ? Patient ID: Kara Wright, female    DOB: [birth year], 76 y.o.   MRN: [id number] ? ?HPI ? ?Kara Wright is a very pleasant 76 y.o. female patient of Dr. Damita Dunnings with a history of atrial fibrillation, hypertension, TIA, hypothyroidism, urinary incontinence who presents today to discuss nausea, vomiting, diarrhea. ? ?Symptoms began two days ago with nausea which then lead into vomiting, chills, headache. Nausea and vomiting lasted for about 24 hours, relieved with a dose of her promethazine. Over the past 24 hours she's experienced diarrhea and lower mid abdominal cramping. Her last episode of vomiting was yesterday afternoon. She continues to notice diarrhea, at least 7-8 episodes daily. Her last episode of diarrhea was 35 mins ago.  ? ?She's been sipping on Ginger Ale over the last two days, anytime she takes a sip she has an episode of diarrhea. She's not drinking anything else except for Ginger Ale. She's not had anything to eat for three days.  ? ?Overall she's feel weak. She's noticed palpitations from her atrial fibrillation. She may have had a fever two nights ago, didn't check her temperature.  ? ?Exposed to her grandson four days ago who had similar symptoms. She was on antibiotics per her dentist about 4 weeks ago, does not recall the name of the medication. ? ? ?Review of Systems  ?Constitutional:  Positive for chills and fatigue.  ?Cardiovascular:  Positive for palpitations.  ?Gastrointestinal:  Positive for abdominal pain, diarrhea and nausea. Negative for vomiting.  ? ?   ? ? ?Past Medical History:  ?Diagnosis Date  ? Atrial fib/flutter, transient   ? 1 episode, resolved, never on coumadin  ? Cataract 2019  ? left eye  ? Clotting disorder ([place])   ? on xarelto  ? Diverticulosis 03/1997  ? Dyspnea   ? Dysrhythmia   ? atrial fibrilation  ? GERD (gastroesophageal reflux disease) 1997  ? good on prilosec  ? Hepatitis   ? hx of hep B (late 1970s)  ? Hyperlipidemia 08/1996  ?  Hypothyroidism 1984  ? Migraine   ? w/o aura  ? Osteopenia 11/2002  ? via dexa  ? Presence of dental prosthetic device   ? temporary while waiting for implant  ? Skin cancer   ? skin cancer  ? Sleep apnea   ? Stroke [facility]) 08/2011  ? TIA  ? ? ?Social History  ? ?Socioeconomic History  ? Marital status: Widowed  ?  Spouse name: Not on file  ? Number of children: 2  ? Years of education: Not on file  ? Highest education level: Not on file  ?Occupational History  ? Occupation: Engineer, structural  ?  Employer: RETIRED  ?  Comment: [employer]  ? Occupation: Financial trader  ?  Employer: [employer]  ?Tobacco Use  ? Smoking status: Former  ?  Packs/day: 1.00  ?  Types: Cigarettes  ?  Quit date: 06/26/1986  ?  Years since quitting: 35.2  ? Smokeless tobacco: Never  ?Vaping Use  ? Vaping Use: Never used  ?Substance and Sexual Activity  ? Alcohol use: No  ? Drug use: No  ? Sexual activity: Not Currently  ?Other Topics Concern  ? Not on file  ?Social History Narrative  ? Worked part time at [employer] office  ? 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    ? Married 1979---widowed 10/22  ? 2 daughters from prev relationship  ? [employer] fan, [employer] fan.  ? ?  Social Determinants of Health  ? ?Financial Resource Strain: Low Risk   ? Difficulty of Paying Living Expenses: Not very hard  ?Food Insecurity: Not on file  ?Transportation Needs: Not on file  ?Physical Activity: Not on file  ?Stress: Not on file  ?Social Connections: Not on file  ?Intimate Partner Violence: Not on file  ? ? ?Past Surgical History:  ?Procedure Laterality Date  ? ABDOMINAL HYSTERECTOMY    ? ANAL FISSURE REPAIR    ? CATARACT EXTRACTION W/PHACO Right 02/06/2018  ? Procedure: CATARACT EXTRACTION PHACO AND INTRAOCULAR LENS PLACEMENT ([place]) RIGHT;  Surgeon: Leandrew Koyanagi, MD;  Location: [facility];  Service: Ophthalmology;  Laterality: Right;  ? CHOLECYSTECTOMY  07/2006  ? LYMPH NODE BIOPSY Right 03/16/2021  ? Procedure: LYMPH NODE BIOPSY;  Surgeon:  Katha Cabal, MD;  Location: [facility];  Service: Vascular;  Laterality: Right;  ? MOHS SURGERY  12/05/2006  ? 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 nasolabial fold skin flap surgery  08/02/2007  ? [facility]  ? PARTIAL HYSTERECTOMY  1978-79  ? POLYPECTOMY  04/06/97  ? colon, benign by pathology  ? varicose vein ablation sclerotherapy  09/2008  ? Dr. Hulda Humphrey  ? ? ?Family History  ?Problem Relation Age of Onset  ? Hypertension Mother   ? Osteopenia Mother   ? Stroke Mother   ?     deceased age 71  ? Colon cancer Father 45  ?     mets  ? Allergies Sister   ? Breast cancer Cousin   ?     pat cousin  ? Rectal cancer Neg Hx   ? ? ?Allergies  ?Allergen Reactions  ? Celebrex [Celecoxib]   ?  REACTION: nausea: GI UPSET  ? Doxycycline Other (See Comments)  ?  GI upset- not an allergy.    ? Gold-Containing Drug Products   ? Nickel   ?  Skin rash, allergy  ? Other   ?  Fragrance, phenylenediamine, contrast metal agents, gold  ? Sodium Metabisulfite Other (See Comments)  ?  Positive patch test  ? Zocor [Simvastatin]   ?  Aches but tolerates crestor  ? ? ?Current Outpatient Medications on File Prior to Visit  ?Medication Sig Dispense Refill  ? albuterol (VENTOLIN HFA) 108 (90 Base) MCG/ACT inhaler Inhale 2 puffs into the lungs every 6 (six) hours as needed for wheezing or shortness of breath. 8 g 3  ? alendronate (FOSAMAX) 70 MG tablet Take 1 tablet (70 mg total) by mouth every 7 (seven) days. Take with a full glass of water on an empty stomach. 13 tablet 3  ? clobetasol ointment (TEMOVATE) 0.05 % Apply topically 2 (two) times daily.    ? D3 SUPER STRENGTH 50 MCG (2000 UT) CAPS TAKE TWO CAPSULES BY MOUTH DAILY 180 capsule 3  ? diltiazem (CARDIZEM) 30 MG tablet TAKE ONE TABLET BY MOUTH THREE TIMES DAILY AS NEEDED 90 tablet 1  ? folic acid (FOLVITE) 1 MG tablet Take 1 mg by mouth daily.    ? levothyroxine (SYNTHROID) 150 MCG tablet 1 pill a day except for 2 pills on Sundays. 110 tablet 3  ? loperamide (IMODIUM A-D) 2 MG  tablet Take 2 mg by mouth 2 (two) times daily as needed for diarrhea or loose stools.    ? methotrexate 2.5 MG tablet TAKE 5 TABLETS BY MOUTH ONCE A WEEK - TAKE FOLIC ACID ALL OTHER DAYS OF THE WEEK    ? omeprazole (PRILOSEC) 20 MG capsule  TAKE ONE CAPSULE BY MOUTH DAILY 90 capsule 3  ? promethazine (PHENERGAN) 25 MG tablet TAKE ONE TABLET BY MOUTH EVERY 6 HOURS AS NEEDED FOR NAUSEA 30 tablet 3  ? propranolol ER (INDERAL LA) 120 MG 24 hr capsule TAKE ONE CAPSULE BY MOUTH AT BEDTIME 90 capsule 3  ? rosuvastatin (CRESTOR) 5 MG tablet Take 1 tablet (5 mg total) by mouth at bedtime. 90 tablet 3  ? traMADol (ULTRAM) 50 MG tablet Take 1-2 tablets (50-100 mg total) by mouth every 12 (twelve) hours as needed. CAUTION SEDATION 50 tablet 1  ? XARELTO 20 MG TABS tablet TAKE ONE TABLET BY MOUTH IN THE EVENING WITH DINNER 90 tablet 3  ? ?No current facility-administered medications on file prior to visit.  ? ? ?BP 130/80   Pulse (!) 104   Temp 97.9 ?F (36.6 ?C)   Ht '5\' 4"'$  (1.626 m)   Wt 216 lb 9.6 oz (98.2 kg)   SpO2 97%   BMI 37.18 kg/m?  ?Objective:  ? Physical Exam ?Constitutional:   ?   General: She is not in acute distress. ?Cardiovascular:  ?   Rate and Rhythm: Normal rate and regular rhythm.  ?Pulmonary:  ?   Effort: Pulmonary effort is normal.  ?   Breath sounds: Normal breath sounds.  ?Abdominal:  ?   General: Bowel sounds are normal.  ?   Palpations: Abdomen is soft.  ?   Tenderness: There is no abdominal tenderness.  ?Musculoskeletal:  ?   Cervical back: Neck supple.  ?Skin: ?   General: Skin is warm and dry.  ? ? ? ? ? ?   ?Assessment & Plan:  ? ? ? ? ?This visit occurred during the SARS-CoV-2 public health emergency.  Safety protocols were in place, including screening questions prior to the visit, additional usage of staff PPE, and extensive cleaning of exam room while observing appropriate contact time as indicated for disinfecting solutions.  ?

## 2021-09-20 NOTE — Telephone Encounter (Signed)
Noted, will evaluate. 

## 2021-09-20 NOTE — Patient Instructions (Signed)
Stop by the lab prior to leaving today. I will notify you of your results once received.  ? ?Return the stool kit as soon as possible. ? ?Be sure to hydrate well. Focus on low calorie Gatorade and water.  You can also try some chicken broth. ? ?We will call you tomorrow with lab results. We will also ask for an update. ? ?If you get worse, go to the hospital. ? ?It was a pleasure to see you today! ? ? ?

## 2021-09-21 LAB — COMPREHENSIVE METABOLIC PANEL
ALT: 14 U/L (ref 0–35)
AST: 24 U/L (ref 0–37)
Albumin: 4.4 g/dL (ref 3.5–5.2)
Alkaline Phosphatase: 61 U/L (ref 39–117)
BUN: 23 mg/dL (ref 6–23)
CO2: 28 mEq/L (ref 19–32)
Calcium: 9.7 mg/dL (ref 8.4–10.5)
Chloride: 98 mEq/L (ref 96–112)
Creatinine, Ser: 1.21 mg/dL — ABNORMAL HIGH (ref 0.40–1.20)
GFR: 43.95 mL/min — ABNORMAL LOW (ref >60.00)
Glucose, Bld: 114 mg/dL — ABNORMAL HIGH (ref 70–99)
Potassium: 4.2 mEq/L (ref 3.5–5.1)
Sodium: 136 mEq/L (ref 135–145)
Total Bilirubin: 0.9 mg/dL (ref 0.2–1.2)
Total Protein: 7.3 g/dL (ref 6.0–8.3)

## 2021-09-21 LAB — CBC WITH DIFFERENTIAL/PLATELET
Basophils Absolute: 0 10*3/uL (ref 0.0–0.1)
Basophils Relative: 0.6 % (ref 0.0–3.0)
Eosinophils Absolute: 0 10*3/uL (ref 0.0–0.7)
Eosinophils Relative: 0.5 % (ref 0.0–5.0)
HCT: 43.5 % (ref 36.0–46.0)
Hemoglobin: 14.1 g/dL (ref 12.0–15.0)
Lymphocytes Relative: 22.3 % (ref 12.0–46.0)
Lymphs Abs: 1.8 10*3/uL (ref 0.7–4.0)
MCHC: 32.5 g/dL (ref 30.0–36.0)
MCV: 77.4 fl — ABNORMAL LOW (ref 78.0–100.0)
Monocytes Absolute: 1 10*3/uL (ref 0.1–1.0)
Monocytes Relative: 11.8 % (ref 3.0–12.0)
Neutro Abs: 5.3 10*3/uL (ref 1.4–7.7)
Neutrophils Relative %: 64.8 % (ref 43.0–77.0)
Platelets: 223 10*3/uL (ref 150.0–400.0)
RBC: 5.62 Mil/uL — ABNORMAL HIGH (ref 3.87–5.11)
RDW: 18.3 % — ABNORMAL HIGH (ref 11.5–15.5)
WBC: 8.2 10*3/uL (ref 4.0–10.5)

## 2021-09-22 ENCOUNTER — Other Ambulatory Visit: Payer: PPO

## 2021-09-22 DIAGNOSIS — R112 Nausea with vomiting, unspecified: Secondary | ICD-10-CM

## 2021-09-22 DIAGNOSIS — R197 Diarrhea, unspecified: Secondary | ICD-10-CM | POA: Diagnosis not present

## 2021-09-26 ENCOUNTER — Telehealth: Payer: Self-pay

## 2021-09-26 NOTE — Telephone Encounter (Signed)
I spoke with Ok Edwards at [facility] to verify the norovirus was the called report and she said yes; advised in future if weekend or holiday and needs to leave a called report [facility] can speak with Oncall provider. Ok Edwards voiced understanding. I spoke with Terri in [facility] lab and since did not take report it does not need to be listed in called report log book. ? ? I spoke with pt; pt had not seen my chart lab result note; pt notified as instructed and pt voiced understanding. Pt said vomiting and diarrhea stopped on 09/22/21. Pt said she is doing good and feels like a human again. Sending note as FYI to Gentry Fitz NP and [place] CMA. ? ? ?

## 2021-09-26 NOTE — Telephone Encounter (Signed)
[facility] Night - Client ?Nonclinical Telephone Record  ?AccessNurse? ?Client [facility] - Client ?Client Site [place] ?Provider Alma Friendly - NP ?Contact Type Call ?Who Is Calling Lab / Radiology ?Lab Name [facility] ?Lab Phone Number [phone] ?Lab Tech Name Bonnita Nasuti ?Lab Reference Number [id number] ?Patient Name Kara Wright ?Patient DOB [birth year] ?Call Type Lab Message Only ?Reason for Call Report lab results ?Initial Comment Caller states they have results to report. ?Disp. Time Disposition Final User ?09/24/2021 3:16:45 PM General Information Provided Yes [place], [place] ?Call Closed By: Gracy Bruins ?Transaction Date/Time: 09/24/2021 3:13:15 PM (ET ?

## 2021-09-26 NOTE — Telephone Encounter (Signed)
Noted and appreciate all who were involved in the care of this patient! Glad she is better! ?

## 2021-09-27 LAB — C. DIFFICILE GDH AND TOXIN A/B
GDH ANTIGEN: NOT DETECTED
MICRO NUMBER:: 13231469
SPECIMEN QUALITY:: ADEQUATE
TOXIN A AND B: NOT DETECTED

## 2021-09-27 LAB — GASTROINTESTINAL PATHOGEN PNL
CampyloBacter Group: NOT DETECTED
Norovirus GI/GII: DETECTED — AB
Rotavirus A: NOT DETECTED
Salmonella species: NOT DETECTED
Shiga Toxin 1: NOT DETECTED
Shiga Toxin 2: NOT DETECTED
Shigella Species: NOT DETECTED
Vibrio Group: NOT DETECTED
Yersinia enterocolitica: NOT DETECTED

## 2021-09-27 LAB — GIARDIA ANTIGEN
MICRO NUMBER:: 13231828
RESULT:: NOT DETECTED
SPECIMEN QUALITY:: ADEQUATE

## 2021-09-29 ENCOUNTER — Other Ambulatory Visit: Payer: Self-pay | Admitting: Family Medicine

## 2021-10-03 ENCOUNTER — Ambulatory Visit (INDEPENDENT_AMBULATORY_CARE_PROVIDER_SITE_OTHER): Payer: PPO | Admitting: Vascular Surgery

## 2021-10-03 ENCOUNTER — Encounter (INDEPENDENT_AMBULATORY_CARE_PROVIDER_SITE_OTHER): Payer: Self-pay | Admitting: Vascular Surgery

## 2021-10-03 VITALS — BP 152/81 | HR 55 | Resp 16 | Wt 225.4 lb

## 2021-10-03 DIAGNOSIS — I10 Essential (primary) hypertension: Secondary | ICD-10-CM

## 2021-10-03 DIAGNOSIS — I25118 Atherosclerotic heart disease of native coronary artery with other forms of angina pectoris: Secondary | ICD-10-CM

## 2021-10-03 DIAGNOSIS — I480 Paroxysmal atrial fibrillation: Secondary | ICD-10-CM | POA: Diagnosis not present

## 2021-10-03 DIAGNOSIS — K219 Gastro-esophageal reflux disease without esophagitis: Secondary | ICD-10-CM | POA: Diagnosis not present

## 2021-10-03 DIAGNOSIS — R591 Generalized enlarged lymph nodes: Secondary | ICD-10-CM

## 2021-10-03 NOTE — Progress Notes (Signed)
? ? ? ? ?MRN : [id number] ? ?Kara Wright is a 76 y.o. (1945/09/17) female who presents with chief complaint of check carotid arteries. ? ?History of Present Illness:  ? ?Procedure 03/16/2021: ?Excisional lymph node biopsy right neck ? ?Patient states that the area seems to be significantly smaller.  She still has some numbness over the shoulder.  She still occasionally gets a kind of shooting pain but this seems self-limited.  She also notes there is a fair bit of itching but states that is common for her. ? ?She has seen the cancer center and they have discharged her.  Her follow-up with rheumatology has also been negative to date. ? ?No outpatient medications have been marked as taking for the 10/03/21 encounter (Appointment) with Delana Meyer, Dolores Lory, MD.  ? ? ?Past Medical History:  ?Diagnosis Date  ? Atrial fib/flutter, transient   ? 1 episode, resolved, never on coumadin  ? Cataract 2019  ? left eye  ? Clotting disorder ([place])   ? on xarelto  ? Diverticulosis 03/1997  ? Dyspnea   ? Dysrhythmia   ? atrial fibrilation  ? GERD (gastroesophageal reflux disease) 1997  ? good on prilosec  ? Hepatitis   ? hx of hep B (late 1970s)  ? Hyperlipidemia 08/1996  ? Hypothyroidism 1984  ? Migraine   ? w/o aura  ? Osteopenia 11/2002  ? via dexa  ? Presence of dental prosthetic device   ? temporary while waiting for implant  ? Skin cancer   ? skin cancer  ? Sleep apnea   ? Stroke [facility]) 08/2011  ? TIA  ? ? ?Past Surgical History:  ?Procedure Laterality Date  ? ABDOMINAL HYSTERECTOMY    ? ANAL FISSURE REPAIR    ? CATARACT EXTRACTION W/PHACO Right 02/06/2018  ? Procedure: CATARACT EXTRACTION PHACO AND INTRAOCULAR LENS PLACEMENT ([place]) RIGHT;  Surgeon: Leandrew Koyanagi, MD;  Location: [facility];  Service: Ophthalmology;  Laterality: Right;  ? CHOLECYSTECTOMY  07/2006  ? LYMPH NODE BIOPSY Right 03/16/2021  ? Procedure: LYMPH NODE BIOPSY;  Surgeon: Katha Cabal, MD;  Location: [facility];  Service: Vascular;   Laterality: Right;  ? MOHS SURGERY  12/05/2006  ? 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 nasolabial fold skin flap surgery  08/02/2007  ? [facility]  ? PARTIAL HYSTERECTOMY  1978-79  ? POLYPECTOMY  04/06/97  ? colon, benign by pathology  ? varicose vein ablation sclerotherapy  09/2008  ? Dr. Hulda Humphrey  ? ? ?Social History ?Social History  ? ?Tobacco Use  ? Smoking status: Former  ?  Packs/day: 1.00  ?  Types: Cigarettes  ?  Quit date: 06/26/1986  ?  Years since quitting: 35.2  ? Smokeless tobacco: Never  ?Vaping Use  ? Vaping Use: Never used  ?Substance Use Topics  ? Alcohol use: No  ? Drug use: No  ? ? ?Family History ?Family History  ?Problem Relation Age of Onset  ? Hypertension Mother   ? Osteopenia Mother   ? Stroke Mother   ?     deceased age 41  ? Colon cancer Father 53  ?     mets  ? Allergies Sister   ? Breast cancer Cousin   ?     pat cousin  ? Rectal cancer Neg Hx   ? ? ?Allergies  ?Allergen Reactions  ? Celebrex [Celecoxib]   ?  REACTION: nausea: GI UPSET  ? Doxycycline Other (See Comments)  ?  GI upset- not  an allergy.    ? Gold-Containing Drug Products   ? Nickel   ?  Skin rash, allergy  ? Other   ?  Fragrance, phenylenediamine, contrast metal agents, gold  ? Sodium Metabisulfite Other (See Comments)  ?  Positive patch test  ? Zocor [Simvastatin]   ?  Aches but tolerates crestor  ? ? ? ?REVIEW OF SYSTEMS (Negative unless checked) ? ?Constitutional: '[]'$ Weight loss  '[]'$ Fever  '[]'$ Chills ?Cardiac: '[]'$ Chest pain   '[]'$ Chest pressure   '[]'$ Palpitations   '[]'$ Shortness of breath when laying flat   '[]'$ Shortness of breath with exertion. ?Vascular:  '[x]'$ Pain in legs with walking   '[]'$ Pain in legs at rest  '[]'$ History of DVT   '[]'$ Phlebitis   '[]'$ Swelling in legs   '[]'$ Varicose veins   '[]'$ Non-healing ulcers ?Pulmonary:   '[]'$ Uses home oxygen   '[]'$ Productive cough   '[]'$ Hemoptysis   '[]'$ Wheeze  '[]'$ COPD   '[]'$ Asthma ?Neurologic:  '[]'$ Dizziness   '[]'$ Seizures   '[]'$ History of stroke   '[]'$ History of TIA  '[]'$ Aphasia   '[]'$ Vissual changes   '[]'$ Weakness or numbness  in arm   '[]'$ Weakness or numbness in leg ?Musculoskeletal:   '[]'$ Joint swelling   '[]'$ Joint pain   '[]'$ Low back pain ?Hematologic:  '[]'$ Easy bruising  '[]'$ Easy bleeding   '[]'$ Hypercoagulable state   '[]'$ Anemic ?Gastrointestinal:  '[]'$ Diarrhea   '[]'$ Vomiting  '[x]'$ Gastroesophageal reflux/heartburn   '[]'$ Difficulty swallowing. ?Genitourinary:  '[]'$ Chronic kidney disease   '[]'$ Difficult urination  '[]'$ Frequent urination   '[]'$ Blood in urine ?Skin:  '[]'$ Rashes   '[]'$ Ulcers  ?Psychological:  '[]'$ History of anxiety   '[]'$  History of major depression. ? ?Physical Examination ? ?There were no vitals filed for this visit. ?There is no height or weight on file to calculate BMI. ?Gen: WD/WN, NAD ?Head: [place]/AT, No temporalis wasting.  ?Ear/Nose/Throat: Hearing grossly intact, nares w/o erythema or drainage ?Eyes: PER, EOMI, sclera nonicteric.  ?Neck: Supple, no masses.  No bruit or JVD.  ?Pulmonary:  Good air movement, no audible wheezing, no use of accessory muscles.  ?Cardiac: RRR, normal S1, S2, no Murmurs. ?Vascular:  right side of the supraclavicular area is still a bit larger compared to the left but it remains soft and the skin is normal ?Vessel Right Left  ?Radial Palpable Palpable  ?Carotid  Palpable  Palpable  ?Subclav  Palpable Palpable  ?Gastrointestinal: soft, non-distended. No guarding/no peritoneal signs.  ?Musculoskeletal: M/S 5/5 throughout.  No visible deformity.  ?Neurologic: CN 2-12 intact. Pain and light touch intact in extremities.  Symmetrical.  Speech is fluent. Motor exam as listed above. ?Psychiatric: Judgment intact, Mood & affect appropriate for pt's clinical situation. ?Dermatologic: No rashes or ulcers noted.  No changes consistent with cellulitis. ? ? ?CBC ?Lab Results  ?Component Value Date  ? WBC 8.2 09/20/2021  ? HGB 14.1 09/20/2021  ? HCT 43.5 09/20/2021  ? MCV 77.4 (L) 09/20/2021  ? PLT 223.0 09/20/2021  ? ? ?BMET ?   ?Component Value Date/Time  ? NA 136 09/20/2021 1533  ? NA 139 08/26/2011 1334  ? K 4.2 09/20/2021 1533  ? K 3.8  08/26/2011 1334  ? CL 98 09/20/2021 1533  ? CL 100 08/26/2011 1334  ? CO2 28 09/20/2021 1533  ? CO2 26 08/26/2011 1334  ? GLUCOSE 114 (H) 09/20/2021 1533  ? GLUCOSE 129 (H) 08/26/2011 1334  ? BUN 23 09/20/2021 1533  ? BUN 12 08/26/2011 1334  ? CREATININE 1.21 (H) 09/20/2021 1533  ? CREATININE 0.80 08/26/2011 1334  ? CALCIUM 9.7 09/20/2021 1533  ? CALCIUM 9.2 08/26/2011 1334  ?  GFRNONAA >60 04/07/2021 1231  ? GFRNONAA >60 08/26/2011 1334  ? GFRAA >60 10/28/2016 0246  ? GFRAA >60 08/26/2011 1334  ? ?Estimated Creatinine Clearance: 45.7 mL/min (A) (by C-G formula based on SCr of 1.21 mg/dL (H)). ? ?COAG ?Lab Results  ?Component Value Date  ? INR 1.1 09/30/2020  ? INR 1.01 08/28/2011  ? ? ?Radiology ?No results found. ? ? ?Assessment/Plan ?1. Lymphadenopathy of head and neck ?The lymph node biopsy site appears to be stable to slightly improved.  It is minimally noticeable.  The incision is healed well.  There was excellent news from the cancer center as this does not appear to be a malignancy.  I remain hopeful that the numbness will resolve with time ? ?No further intervention is recommended at this time no aspiration is recommended at this time. ? ?She will follow up PRN ? ?2. Coronary artery disease of native heart with stable angina pectoris, unspecified vessel or lesion type ([place]) ?Continue cardiac and antihypertensive medications as already ordered and reviewed, no changes at this time. ? ?Continue statin as ordered and reviewed, no changes at this time ? ?Nitrates PRN for chest pain  ? ?3. Paroxysmal atrial fibrillation (HCC) ?Continue antiarrhythmia medications as already ordered, these medications have been reviewed and there are no changes at this time. ? ?Continue anticoagulation as ordered by Cardiology Service  ? ?4. Primary hypertension ?Continue antihypertensive medications as already ordered, these medications have been reviewed and there are no changes at this time.  ? ?5. Gastroesophageal reflux  disease without esophagitis ?Continue PPI as already ordered, this medication has been reviewed and there are no changes at this time. ? ?Avoidence of caffeine and alcohol ? ?Moderate elevation of the head of the bed   ?

## 2021-10-10 ENCOUNTER — Other Ambulatory Visit (INDEPENDENT_AMBULATORY_CARE_PROVIDER_SITE_OTHER): Payer: PPO

## 2021-10-10 DIAGNOSIS — E039 Hypothyroidism, unspecified: Secondary | ICD-10-CM

## 2021-10-10 LAB — TSH: TSH: 4.67 u[IU]/mL (ref 0.35–5.50)

## 2021-10-13 ENCOUNTER — Encounter: Payer: Self-pay | Admitting: Family Medicine

## 2021-10-13 ENCOUNTER — Ambulatory Visit (INDEPENDENT_AMBULATORY_CARE_PROVIDER_SITE_OTHER): Payer: PPO | Admitting: Family Medicine

## 2021-10-13 VITALS — BP 128/76 | HR 56 | Temp 98.2°F | Ht 64.0 in | Wt 227.0 lb

## 2021-10-13 DIAGNOSIS — M810 Age-related osteoporosis without current pathological fracture: Secondary | ICD-10-CM

## 2021-10-13 DIAGNOSIS — E039 Hypothyroidism, unspecified: Secondary | ICD-10-CM

## 2021-10-13 DIAGNOSIS — M25559 Pain in unspecified hip: Secondary | ICD-10-CM | POA: Diagnosis not present

## 2021-10-13 DIAGNOSIS — R591 Generalized enlarged lymph nodes: Secondary | ICD-10-CM | POA: Diagnosis not present

## 2021-10-13 DIAGNOSIS — Z0000 Encounter for general adult medical examination without abnormal findings: Secondary | ICD-10-CM

## 2021-10-13 DIAGNOSIS — I480 Paroxysmal atrial fibrillation: Secondary | ICD-10-CM

## 2021-10-13 DIAGNOSIS — Z7189 Other specified counseling: Secondary | ICD-10-CM

## 2021-10-13 DIAGNOSIS — E7849 Other hyperlipidemia: Secondary | ICD-10-CM | POA: Diagnosis not present

## 2021-10-13 MED ORDER — TRAMADOL HCL 50 MG PO TABS: 50.0000 mg | ORAL_TABLET | Freq: Two times a day (BID) | ORAL | 1 refills | Status: DC | PRN

## 2021-10-13 NOTE — Progress Notes (Signed)
I have personally reviewed the Medicare Annual Wellness questionnaire and have noted ?1. The patient's medical and social history ?2. Their use of alcohol, tobacco or illicit drugs ?3. Their current medications and supplements ?4. The patient's functional ability including ADL's, fall risks, home safety risks and hearing or visual ?            impairment. ?5. Diet and physical activities ?6. Evidence for depression or mood disorders ? ?The patients weight, height, BMI have been recorded in the chart and visual acuity is per eye clinic.  ?I have made referrals, counseling and provided education to the patient based review of the above and I have provided the pt with a written personalized care plan for preventive services. ? ?Provider list updated- see scanned forms.  Routine anticipatory guidance given to patient.  See health maintenance. The possibility exists that previously documented standard health maintenance information may have been brought forward from a previous encounter into this note.  If needed, that same information has been updated to reflect the current situation based on today's encounter.   ? ?Flu previously done ?Shingles previously done ?PNA previously done ?Tetanus 2020/09/03 ?COVID-vaccine previously done ?Colonoscopy 09-03-17 ?Breast cancer screening 2021/09/03 ?DXA 09-03-2020 ?Advance directive- daughter Selmer Dominion designated if patient were incapacitated.   ?Cognitive function addressed- see scanned forms- and if abnormal then additional documentation follows.  ? ?In addition to [facility] Wellness, follow up visit for the below conditions: ? ?===================================================== ?D/w pt about vascular eval: ?1. Lymphadenopathy of head and neck ?The lymph node biopsy site appears to be stable to slightly improved.  It is minimally noticeable.  The incision is healed well.  There was excellent news from the cancer center as this does not appear to be a malignancy.  I remain hopeful that the  numbness will resolve with time ? ?No further intervention is recommended at this time no aspiration is recommended at this time. ?  ?She will follow up PRN ?===================================================== ? ?She'll have f/u CT this summer per pulmonary.   ? ?Her husband died in 2020/09/03, d/w pt.  Her grandson is staying with patient.  ? ?AF.  Anticoagulated.  She noted more episodes with covid and since.  DOE since having covid but not acutely worse, ie more recently stable at new baseline.  Rare sputum, not discolored.  No CP.   ? ?Had used tramadol for hip pain.  No ADE on med.  It helped.  Used prn.  Rx sent.   ? ?Hypothyroidism.  Compliant.  TSH wnl.  No dysphagia.   ? ?Elevated Cholesterol: ?Using medications without problems: yes ?Muscle aches: no ?Diet compliance: d/w pt.  ?Exercise: d/w pt.   ? ?Osteoporosis.  Still on Fosamax at baseline.  No ADE on med.  D/w pt about continuing.  Cautions d/w pt.   ? ?She has tolerable RLS sx and she'll update me as needed.   ? ?PMH and SH reviewed ? ?Meds, vitals, and allergies reviewed.  ? ?ROS: Per HPI.  Unless specifically indicated otherwise in HPI, the patient denies: ? ?General: fever. ?Eyes: acute vision changes ?ENT: sore throat ?Cardiovascular: chest pain ?Respiratory: SOB ?GI: vomiting ?GU: dysuria ?Musculoskeletal: acute back pain ?Derm: acute rash ?Neuro: acute motor dysfunction ?Psych: worsening mood ?Endocrine: polydipsia ?Heme: bleeding ?Allergy: hayfever ? ?GEN: nad, alert and oriented ?HEENT: ncat ?NECK: supple w/o LA ?CV: rrr. ?PULM: ctab, no inc wob ?ABD: soft, +bs ?EXT: no edema ?SKIN: no acute rash ?

## 2021-10-13 NOTE — Patient Instructions (Addendum)
Fasting lab visit in about 4 months.   ?Take care.  Glad to see you. ?Use tramadol if needed.   ?Please call about follow up with cardiology.  ?Pulmonary should call about a CT this summer.   ?

## 2021-10-16 NOTE — Assessment & Plan Note (Signed)
Flu previously done ?Shingles previously done ?PNA previously done ?Tetanus 2022 ?COVID-vaccine previously done ?Colonoscopy 2019 ?Breast cancer screening 2023 ?DXA 2022 ?Advance directive- daughter Selmer Dominion designated if patient were incapacitated.   ?Cognitive function addressed- see scanned forms- and if abnormal then additional documentation follows.  ?

## 2021-10-16 NOTE — Assessment & Plan Note (Signed)
Advance directive- daughter Selmer Dominion designated if patient were incapacitated.   ?

## 2021-10-16 NOTE — Assessment & Plan Note (Signed)
Continue Crestor 

## 2021-10-16 NOTE — Assessment & Plan Note (Signed)
Continue tramadol as needed 

## 2021-10-16 NOTE — Assessment & Plan Note (Signed)
Anticoagulated.  She noted more episodes with covid and since.  DOE since having covid but not acutely worse, ie more recently stable at new baseline.  Rare sputum, not discolored.   Continue diltiazem 3 times a day as needed.  Continue propranolol Crestor and Xarelto.  She is going to follow-up with cardiology.  Lungs are clear.  Not having chest pain.  Still okay for outpatient follow-up. ?

## 2021-10-16 NOTE — Assessment & Plan Note (Signed)
TSH normal ?- Continue levothyroxine ?

## 2021-10-16 NOTE — Assessment & Plan Note (Signed)
Still on Fosamax at baseline.  No ADE on med.  D/w pt about continuing.  Cautions d/w pt. continue Fosamax. ?

## 2021-10-16 NOTE — Assessment & Plan Note (Signed)
Discussed that this appears to be benign.  She is going to update me as needed. ?

## 2021-10-24 ENCOUNTER — Telehealth: Payer: Self-pay

## 2021-10-25 ENCOUNTER — Telehealth: Payer: Self-pay

## 2021-10-25 NOTE — Telephone Encounter (Signed)
Prior Auth for Tramadol started on Cover my meds on 10/24/21.  Response was:   ?(Key: [id number]) ?Outcome ?N/A ?Electronic Prior Authorization not supported. Submit via other methods ?Next Steps ?The plan will fax you a determination, typically within 1 to 5 business days. ? ?Drug ?traMADol HCl '50MG'$  tablets ?

## 2021-10-26 NOTE — Telephone Encounter (Signed)
Per TE on 10/24/21 PA was approved by insurance from 10/24/21 to 06/18/22 ?

## 2021-10-27 DIAGNOSIS — G4733 Obstructive sleep apnea (adult) (pediatric): Secondary | ICD-10-CM | POA: Diagnosis not present

## 2021-11-24 NOTE — Progress Notes (Signed)
Cardiology Office Note  Date:  11/25/2021   ID:  Kara Wright, Kara Wright Sep 04, 1945, MRN [id number]  PCP:  Tonia Ghent, MD   Chief Complaint  Patient presents with   Other    12 month f/u c/o fatigue not feeling well lately. Pt would like to discuss infection in tooth. Meds reviewed verbally with pt.    HPI:  Ms. Daughdrill is a very pleasant 76 year-old woman with  morbid obesity,  hypothyroidism,  remote smoking history for less than 10 years,  prior TIA,CVA.  OSA on CPAP atrial fibrillation seen on Holter monitor in 2013 She presents today for follow-up of her paroxysmal atrial fibrillation  Last seen in clinic by myself March 2021 Seen by one of our providers April 2022  In follow-up today reports she has had significant paroxysmal atrial fibrillation, worse this year Has been taking extra diltiazem 30 mg pills She is concerned about a tooth infection Notes that she has no energy, On CPAP, nasal pillow  Works Warehouse manager at [employer] To lose weight, no regular exercise program  EKG personally reviewed by myself on todays visit Normal sinus rhythm rate 77 bpm no significant ST T wave changes  History reviewed Covid 08/2018 More atrial fib in recovery Echo 07/2018 1. The left ventricle has normal systolic function, with an ejection  fraction of 55-60%. The cavity size was normal. Left ventricular diastolic  parameters were normal.   atrial fibrillation April 03, 2018 2 hour spell on beach trip Took diltiazem x 3, propranolol 20 x1,  Finally went back to normal sinus rhythm Typically episodes of atrial fibrillation will last 20 to 30 minutes, this was the longest Prior episode before that was 3 to 4 months earlier  Hospitalization May 2018 Pneumonia with COPD exacerbation Treated with antibiotics and steroids -CT high-resolution without ILD findings  -Echocardiogram with normal EF and diastolic function  -D-dimer negative, low suspicion for PE  -Possible component of  COPD, although no previous diagnosis.    February 22nd 2016 she had 30 minutes of tachycardia. She is concerned this could have been atrial fibrillation    08/25/2011,  right eye vision changes, right arm weakness and numbness lasting for 30-40 minutes. Symptoms seemed to resolve. The next day, on Saturday, she had some mouth and facial numbness and she went to the emergency room. She also had headache. They did a EKG and CT scan of the head that showed no stroke. Early in the week, she continued to have headache and mouth issues and she was taken to [facility]. She had MRA that showed small vessel disease, carotid ultrasound that showed mild plaque. She reports that she was in the hospital for 3 days. No atrial fibrillation was seen.   PMH:   has a past medical history of Atrial fib/flutter, transient, Cataract (2019), Clotting disorder ([place]), Diverticulosis (03/1997), Dyspnea, Dysrhythmia, GERD (gastroesophageal reflux disease) (1997), Hepatitis, Hyperlipidemia (08/1996), Hypothyroidism (1984), Migraine, Osteopenia (11/2002), Presence of dental prosthetic device, Skin cancer, Sleep apnea, and Stroke ([place]) (08/2011).  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 Hulda Humphrey    Current Outpatient Medications  Medication Sig Dispense Refill   albuterol (VENTOLIN HFA) 108 (90 Base) MCG/ACT inhaler Inhale 2 puffs into the lungs every 6 (six) hours as needed for wheezing or shortness of breath. 8 g 3   alendronate (FOSAMAX) 70 MG tablet Take 1 tablet (70 mg total) by mouth every 7 (seven) days. Take with a full glass of water on an empty stomach. 13 tablet 3   clobetasol ointment (TEMOVATE) 0.05 % Apply topically 2 (two) times daily.     D3 SUPER STRENGTH 50 MCG (2000 UT) CAPS TAKE TWO CAPSULES BY MOUTH DAILY 180 capsule 3   diltiazem (CARDIZEM) 30 MG tablet TAKE ONE TABLET BY MOUTH THREE TIMES DAILY AS NEEDED 90 tablet 1   folic acid (FOLVITE) 1 MG tablet Take 1 mg by mouth daily.     levothyroxine (SYNTHROID) 150 MCG tablet TAKE ONE TABLET BY MOUTH ON MONDAY THROUGH SATURDAY EACH WEEK and TAKE TWO TABLETS ONCE WEEKLY ON SUNDAY 110 tablet 3   loperamide (IMODIUM A-D) 2 MG tablet Take 2 mg by mouth 2 (two) times daily as needed for diarrhea or loose stools.     methotrexate 2.5 MG tablet TAKE 5 TABLETS BY MOUTH ONCE A WEEK - TAKE FOLIC ACID ALL OTHER DAYS OF THE WEEK     omeprazole (PRILOSEC) 20 MG capsule TAKE ONE CAPSULE BY MOUTH DAILY 90 capsule 3   promethazine (PHENERGAN) 25 MG tablet TAKE ONE TABLET BY MOUTH EVERY 6 HOURS AS NEEDED FOR NAUSEA 30 tablet 3   propranolol ER (INDERAL LA) 120 MG 24 hr capsule TAKE ONE CAPSULE BY MOUTH AT BEDTIME 90 capsule 3   rosuvastatin (CRESTOR) 5 MG tablet TAKE ONE TABLET BY MOUTH AT BEDTIME 90 tablet 3   traMADol (ULTRAM) 50 MG tablet Take 1-2 tablets (50-100 mg total) by mouth every 12 (twelve) hours as needed. CAUTION SEDATION 50 tablet 1   XARELTO 20 MG TABS tablet TAKE ONE TABLET BY MOUTH IN THE EVENING WITH DINNER 90 tablet 3   No current facility-administered medications for this visit.     Allergies:   Aurothioglucose, Celebrex [celecoxib], Doxycycline, Gold-containing drug products,  Nickel, Other, Simvastatin, and Sodium metabisulfite   Social History:  The patient  reports that she quit smoking about 35 years ago. Her smoking use included cigarettes. She smoked an average of 1 pack per day. She has never used smokeless tobacco. She reports that she does not drink alcohol and does not use drugs.   Family History:   family history includes Allergies in her sister; Breast cancer in her cousin; Colon cancer (age of onset: 46) in her father; Hypertension in her mother; Osteopenia in her mother; Stroke in her mother.    Review of Systems: Review of Systems  Constitutional: Negative.   Respiratory: Negative.    Cardiovascular: Negative.   Gastrointestinal: Negative.   Musculoskeletal:  Positive for joint pain.  Neurological: Negative.   Psychiatric/Behavioral: Negative.    All other systems reviewed and are negative.   PHYSICAL EXAM: VS:  BP 124/70 (BP Location: Left Arm, Patient Position:  Sitting, Cuff Size: Large)   Pulse 77   Ht 5' 4.5" (1.638 m)   Wt 232 lb (105.2 kg)   SpO2 98%   BMI 39.21 kg/m  , BMI Body mass index is 39.21 kg/m.  Constitutional:  oriented to person, place, and time. No distress.  HENT:  Head: Grossly normal Eyes:  no discharge. No scleral icterus.  Neck: No JVD, no carotid bruits  Cardiovascular: Regular rate and rhythm, no murmurs appreciated Pulmonary/Chest: Clear to auscultation bilaterally, no wheezes or rails Abdominal: Soft.  no distension.  no tenderness.  Musculoskeletal: Normal range of motion Neurological:  normal muscle tone. Coordination normal. No atrophy Skin: Skin warm and dry Psychiatric: normal affect, pleasant  Recent Labs: 09/20/2021: ALT 14; BUN 23; Creatinine, Ser 1.21; Hemoglobin 14.1; Platelets 223.0; Potassium 4.2; Sodium 136 10/10/2021: TSH 4.67    Lipid Panel Lab Results  Component Value Date   CHOL 110 09/13/2020   HDL 34.70 (L) 09/13/2020   LDLCALC 45 09/13/2020   TRIG 154.0 (H) 09/13/2020    Wt  Readings from Last 3 Encounters:  11/25/21 232 lb (105.2 kg)  10/13/21 227 lb (103 kg)  10/03/21 225 lb 6.4 oz (102.2 kg)      ASSESSMENT AND PLAN:  Paroxysmal atrial fibrillation ([place]) Reports having frequent episodes atrial fibrillation past several months Options include adding diltiazem ER 120 daily with slightly decreased propranolol ER dosing continuing to take diltiazem as needed versus adding antiarrhythmic such as flecainide twice daily The time being she prefers to continue diltiazem as needed Recommend she call us if symptoms get worse -Continue anticoagulation  Pure hypercholesterolemia Cholesterol is at goal on the current lipid regimen. No changes to the medications were made.  Essential hypertension Blood pressure is well controlled on today's visit. No changes made to the medications.  Morbid obesity ([place]) We have encouraged continued exercise, careful diet management in an effort to lose weight.  SOB Recommended walking program for conditioning, chair exercises    Total encounter time more than 30 minutes  Greater than 50% was spent in counseling and coordination of care with the patient    No orders of the defined types were placed in this encounter.    Signed, Esmond Plants, M.D., Ph.D. 11/25/2021  [place], [place]

## 2021-11-25 ENCOUNTER — Encounter: Payer: Self-pay | Admitting: Cardiovascular Disease

## 2021-11-25 ENCOUNTER — Ambulatory Visit: Payer: PPO | Admitting: Cardiovascular Disease

## 2021-11-25 VITALS — BP 124/70 | HR 77 | Ht 64.5 in | Wt 232.0 lb

## 2021-11-25 DIAGNOSIS — E039 Hypothyroidism, unspecified: Secondary | ICD-10-CM | POA: Diagnosis not present

## 2021-11-25 DIAGNOSIS — I10 Essential (primary) hypertension: Secondary | ICD-10-CM | POA: Diagnosis not present

## 2021-11-25 DIAGNOSIS — I480 Paroxysmal atrial fibrillation: Secondary | ICD-10-CM

## 2021-11-25 DIAGNOSIS — Z8673 Personal history of transient ischemic attack (TIA), and cerebral infarction without residual deficits: Secondary | ICD-10-CM

## 2021-11-25 DIAGNOSIS — E7849 Other hyperlipidemia: Secondary | ICD-10-CM | POA: Diagnosis not present

## 2021-11-25 DIAGNOSIS — G4730 Sleep apnea, unspecified: Secondary | ICD-10-CM

## 2021-11-25 DIAGNOSIS — Z87891 Personal history of nicotine dependence: Secondary | ICD-10-CM

## 2021-11-25 DIAGNOSIS — Z9181 History of falling: Secondary | ICD-10-CM

## 2021-11-25 NOTE — Patient Instructions (Signed)
Medication Instructions:  No changes  Call if you would like arrhythmia medication for atrial fibrillation  If you need a refill on your cardiac medications before your next appointment, please call your pharmacy.   Lab work: No new labs needed  Testing/Procedures: No new testing needed  Follow-Up: At [facility], you and your health needs are our priority.  As part of our continuing mission to provide you with exceptional heart care, we have created designated Provider Care Teams.  These Care Teams include your primary Cardiologist (physician) and Advanced Practice Providers (APPs -  Physician Assistants and Nurse Practitioners) who all work together to provide you with the care you need, when you need it.  You will need a follow up appointment in 12 months  Providers on your designated Care Team:   Murray Hodgkins, NP Christell Faith, PA-C Cadence Kathlen Mody, [place]  COVID-19 Vaccine Information can be found at: [email or web address].uk For questions related to vaccine distribution or appointments, please email [email or web address] or call [phone].

## 2021-12-28 ENCOUNTER — Telehealth: Payer: PPO

## 2021-12-29 ENCOUNTER — Other Ambulatory Visit: Payer: Self-pay | Admitting: Family Medicine

## 2022-01-11 ENCOUNTER — Telehealth: Payer: Self-pay | Admitting: Family Medicine

## 2022-01-11 NOTE — Telephone Encounter (Signed)
She has had both updated shingles vaccines so she does not need those. What about the pneumonia?

## 2022-01-11 NOTE — Telephone Encounter (Signed)
Called patient and let her know her vaccines are all up to date. Patient stated she will call the pharmacy and let them know she does not need any shots.

## 2022-01-11 NOTE — Telephone Encounter (Signed)
Up to date and doesn't need to repeat shingles or PNA.  Thanks.

## 2022-01-11 NOTE — Telephone Encounter (Signed)
Patient called and said the drug store called her and said she was due for shingles shot and pneumonia shot and she wanted to know if she needed to  get these done. Call back is [phone]

## 2022-01-17 ENCOUNTER — Ambulatory Visit
Admission: RE | Admit: 2022-01-17 | Discharge: 2022-01-17 | Disposition: A | Discharge status: Home / Self Care | Payer: PPO | Source: Ambulatory Visit | Attending: Family Medicine | Admitting: Family Medicine

## 2022-01-17 DIAGNOSIS — R911 Solitary pulmonary nodule: Secondary | ICD-10-CM | POA: Diagnosis not present

## 2022-01-17 DIAGNOSIS — R918 Other nonspecific abnormal finding of lung field: Secondary | ICD-10-CM | POA: Diagnosis not present

## 2022-02-01 ENCOUNTER — Telehealth: Payer: Self-pay | Admitting: Cardiovascular Disease

## 2022-02-01 DIAGNOSIS — H1013 Acute atopic conjunctivitis, bilateral: Secondary | ICD-10-CM | POA: Diagnosis not present

## 2022-02-01 NOTE — Telephone Encounter (Signed)
Spoke with patient and she stated that a few weeks ago she started having episodes where she could not get the words out clearly to explain what she was thinking. She stated that a couple days ago she started seeing blurry spots. She stated that she is at the eye doctor now and is about to be seen.  I advised that she discuss all her symptoms with the doctor and that they will probably do a quick stroke assessment on her, and that if recommended she should probably go to the ED. Patient agreed and stated that her grandson could take her to the ED after her appointment. Will route to Dr. Donivan Scull nurse for follow up.

## 2022-02-01 NOTE — Telephone Encounter (Signed)
   Pt c/o BP issue: STAT if pt c/o blurred vision, one-sided weakness or slurred speech  1. What are your last 5 BP readings? Has not taken   2. Are you having any other symptoms (ex. Dizziness, headache, blurred vision, passed out)? Dizziness, blurred vision, right cheek numb  3. What is your BP issue? Pt states that today she has been having some stroke like symptoms. She states that her right cheek is numb, this morning her vision was blurry, and lightheadedness  Call transferred to triage, Ralene Muskrat, RN

## 2022-02-08 NOTE — Progress Notes (Signed)
Tay,   Please let patient know CT chest is stable. She needs a repeat CT Chest wo contrast in 12 months for follow up nodules. Please ensure follow up appt with Eric Form, NP after CT chest complete in 1 year.   Thanks,  BLI  Garner Nash, DO [facility] 02/08/2022 5:40 PM

## 2022-02-09 ENCOUNTER — Other Ambulatory Visit: Payer: Self-pay

## 2022-02-09 DIAGNOSIS — R911 Solitary pulmonary nodule: Secondary | ICD-10-CM

## 2022-02-13 DIAGNOSIS — L820 Inflamed seborrheic keratosis: Secondary | ICD-10-CM | POA: Diagnosis not present

## 2022-02-13 DIAGNOSIS — D2262 Melanocytic nevi of left upper limb, including shoulder: Secondary | ICD-10-CM | POA: Diagnosis not present

## 2022-02-13 DIAGNOSIS — L570 Actinic keratosis: Secondary | ICD-10-CM | POA: Diagnosis not present

## 2022-02-13 DIAGNOSIS — L538 Other specified erythematous conditions: Secondary | ICD-10-CM | POA: Diagnosis not present

## 2022-02-13 DIAGNOSIS — D225 Melanocytic nevi of trunk: Secondary | ICD-10-CM | POA: Diagnosis not present

## 2022-02-13 DIAGNOSIS — D2272 Melanocytic nevi of left lower limb, including hip: Secondary | ICD-10-CM | POA: Diagnosis not present

## 2022-02-13 DIAGNOSIS — Z85828 Personal history of other malignant neoplasm of skin: Secondary | ICD-10-CM | POA: Diagnosis not present

## 2022-03-16 DIAGNOSIS — M1712 Unilateral primary osteoarthritis, left knee: Secondary | ICD-10-CM | POA: Diagnosis not present

## 2022-04-06 ENCOUNTER — Telehealth: Payer: Self-pay | Admitting: *Deleted

## 2022-04-06 NOTE — Patient Outreach (Signed)
  Care Coordination   Initial Visit Note   04/06/2022 Name: Jacqulin Brandenburger MRN: [id number] DOB: 1946-03-24  Jlyn Cerros is a 76 y.o. year old female who sees Tonia Ghent, MD for primary care. I spoke with  [name] by phone today.  What matters to the patients health and wellness today?  Getting flu shot. Managing diet.  RN discussed places to get flu vaccine.   Goals Addressed             This Visit's Progress    Develop Plan of care for management of Hypertension and diet          SDOH assessments and interventions completed:  Yes     Care Coordination Interventions Activated:  Yes  Care Coordination Interventions:  Yes, provided   Follow up plan: Follow up call scheduled for Quinn Plowman 04/19/2022 3:00 PM    Encounter Outcome:  Pt. Scheduled   Johny Shock BSN RN [place] [facility] [phone]

## 2022-04-17 ENCOUNTER — Encounter (INDEPENDENT_AMBULATORY_CARE_PROVIDER_SITE_OTHER): Payer: Self-pay

## 2022-04-19 ENCOUNTER — Ambulatory Visit: Payer: Self-pay

## 2022-04-19 NOTE — Patient Outreach (Signed)
  Care Coordination   Follow Up Visit Note   04/19/2022 Name: Kara Wright MRN: [id number] DOB: 05-Mar-1946  Kara Wright is a 76 y.o. year old female who sees Tonia Ghent, MD for primary care. I spoke with  Kara Wright by phone today.  What matters to the patients health and wellness today?  Patient states she is still having a lack of energy and shortness of breath since having COVID a few years ago.  She reports having her yearly physical with her primary care provider and states she received a good report.  Patient states last year during a hospital admission she was found to have lymph nodes in her lungs that come and go. She reports having extensive work up with testing and seeing several specialist.  Patient states her pulmonologist request she have CT scans of her lung with last scan being 01/2022.  She states she has not had a follow up visit with the pulmonologist since 01/2021.      Goals Addressed             This Visit's Progress    Patient stated: " managing lack of energy and shortness of breath"       Care Coordination Interventions: Evaluation of current treatment plan related to fatigue and shortness of breath and patient's adherence to plan as established by provider Reviewed medications with patient and discussed importance of medication compliance Reviewed scheduled/upcoming provider appointments  Discussed plans with patient for ongoing care management follow up and provided patient with direct contact information for care management team Advised patient to call and schedule follow up appointment with pulmonologist.  Last pulmonary appointment 01/2021.  Discussed ways to manage fatigue:  healthy diet, hydration, increase activity level as tolerated.  Encouraged patient to start slow with incorporating exercise in daily routine.  Advised to do 2-3 10 min walks per day.           SDOH assessments and interventions completed:  No     Care  Coordination Interventions Activated:  Yes  Care Coordination Interventions:  Yes, provided   Follow up plan: Follow up call scheduled for 05/23/22 at 2:30 pm    Encounter Outcome:  Pt. Visit Completed   Quinn Plowman RN,BSN,CCM [place] [phone] direct line

## 2022-04-22 ENCOUNTER — Other Ambulatory Visit: Payer: Self-pay | Admitting: Family Medicine

## 2022-05-05 DIAGNOSIS — Z79899 Other long term (current) drug therapy: Secondary | ICD-10-CM | POA: Diagnosis not present

## 2022-05-23 ENCOUNTER — Ambulatory Visit: Payer: Self-pay

## 2022-05-23 NOTE — Patient Outreach (Signed)
  Care Coordination   Follow Up Visit Note   05/23/2022 Name: Kara Wright MRN: [id number] DOB: Oct 22, 1945  Kara Wright is a 76 y.o. year old female who sees Tonia Ghent, MD for primary care. I spoke with  [name] by phone today.  What matters to the patients health and wellness today?  Patient states she did not get to schedule follow up appointment with pulmonologist.  She states she continues to have fatigue and shortness of breath.  She states if she walks in the grocery store she gets short of breath. Patient states she feels if she could lose weight it may make her feel better.   Patient states she cut her soda intake down to just one can of pepsi per day and the rest of the day drinks water.  She states she is trying to make more changes related to her diet.     Goals Addressed             This Visit's Progress    Patient stated: " managing lack of energy and shortness of breath"       Care Coordination Interventions: Evaluation of current treatment plan related to fatigue and shortness of breath and patient's adherence to plan as established by provider Reviewed medications with patient and discussed importance of medication compliance Reviewed scheduled/upcoming provider appointments  Patient states she has not called to scheduled follow up with pulmonologist.  Advised patient to call and schedule follow up appointment with pulmonologist.  Last pulmonary appointment 01/2021.  Reviewed with patient ways to manage fatigue:  healthy diet, hydration, increase activity level as tolerated.  Encouraged patient to start slow with incorporating exercise in daily routine.  Advised to do 2-3 10 min walks per day.  Discussed conserving energy throughout the day by doing heavy housework, shopping, laundry when you have the most energy. Spread household chores throughout the week and not all in one day.           SDOH assessments and interventions completed:  No      Care Coordination Interventions:  No, not indicated   Follow up plan: Follow up call scheduled for 07/11/22    Encounter Outcome:  Pt. Visit Completed   Quinn Plowman RN,BSN,CCM [place] [phone] direct line

## 2022-06-22 ENCOUNTER — Encounter: Payer: Self-pay | Admitting: Pulmonary Disease

## 2022-06-22 ENCOUNTER — Ambulatory Visit (INDEPENDENT_AMBULATORY_CARE_PROVIDER_SITE_OTHER): Payer: PPO | Admitting: Pulmonary Disease

## 2022-06-22 VITALS — BP 140/80 | HR 57 | Ht 64.0 in | Wt 226.4 lb

## 2022-06-22 DIAGNOSIS — R918 Other nonspecific abnormal finding of lung field: Secondary | ICD-10-CM

## 2022-06-22 DIAGNOSIS — Z6841 Body Mass Index (BMI) 40.0 and over, adult: Secondary | ICD-10-CM | POA: Diagnosis not present

## 2022-06-22 DIAGNOSIS — G4733 Obstructive sleep apnea (adult) (pediatric): Secondary | ICD-10-CM

## 2022-06-22 DIAGNOSIS — Z87891 Personal history of nicotine dependence: Secondary | ICD-10-CM

## 2022-06-22 NOTE — Progress Notes (Signed)
Synopsis: Referred in February 2020 for nodules by Tonia Ghent, MD  Subjective:   PATIENT ID: Kara Wright GENDER: female DOB: [birth year], MRN: [id number]  Chief Complaint  Patient presents with   Follow-up    Past medical history of atrial fib flutter, clotting disorder on Xarelto, hyperlipidemia, hypothyroidism, history of TIA stroke. Patient recently had follow up CT imaging that revealed stable nodule in the LUL and new nodule in the RLL.  In addition the patient has a past 8 to 66-monthhistory of dyspnea on exertion.  She states that she gets "short winded" when she is climbs a hill or climb stairs.  When asked about her sleep her husband pipes up that she does snore at times.  She is not sure if she thinks her sleep is restful or not.  Patient does not wheeze.  She occasionally has random left-sided sharp pains in the chest.  This is been going on for some time.  She follows with cardiology regarding her atrial fibrillation.  Review of her CT imaging today in the office also reveals enlarged pulmonary arteries.  This was discussed with the patient.  OV 12/18/2018: Patient seen here today for follow-up regarding multiple pulmonary nodules.  Patient had CT imaging of the chest completed on 12/13/2018 for follow-up regarding her nodules.  There is a new small 5 mm nodule in the right pulmonary apex a new 4 mm nodule in the anterior left upper lobe prior nodules either resolved or stable. Today, she states that she has breathing better.  She does have some dyspnea on exertion.  She was unable to get her sleep appointment scheduled.  We will try again for this.  She was very anxious about having the CT scan for the follow-up of the nodules.  She is relieved to hear that they are smaller.  She does understand there is a few new ones that need follow-up.  Otherwise doing well.  She had PFTs completed prior to today's office which which we reviewed.  It does reveal a reduced FEV1 and FVC with a  reserved ratio and a reduced ERV of 10%.  DLCO of 74%.  This is likely related to her weight.  BMI of 40.  We discussed this today in the office as well.  She is trying to watch her weight and her dietary intake.  She does understand importance of losing weight.  OV 07/22/2020: Patient here today for follow-up regarding recent CT scan of the chest for follow-up of lung nodules.  Patient had a CT scan of the chest on 07/12/2020 which revealed persistent waxing and waning bilateral lung nodules.  The left lower lobe lung nodule 1 cm in size seems to be the largest left the other upper lobe nodules that was seen previously are no longer present have subsided she also has some small adenopathy within the chest.  She does have enlarged pulmonary arteries consistent with possible underlying PAH.  From respiratory standpoint she is stable.  She does not have cough sputum production night sweats weight loss fevers.    OV 06/22/2022: Here today for follow-up.  She had a CT scan in August which showed stability of her pulmonary nodules.  She has no other significant symptoms at this time.  She fell this morning and out the back door when she was trying to move a dog water dish.  She states she is doing okay from that.  She has been working on her weight.  We talked about  various weight loss options today in the office.      Past Medical History:  Diagnosis Date   Atrial fib/flutter, transient    Cataract 2019   left eye   Clotting disorder ([place])    on xarelto   Diverticulosis 03/1997   Dyspnea    Dysrhythmia    atrial fibrilation   GERD (gastroesophageal reflux disease) 1997   good on prilosec   Hepatitis    hx of hep B (late 1970s)   Hyperlipidemia 08/1996   Hypothyroidism 1984   Migraine    w/o aura   Osteopenia 11/2002   via dexa   Presence of dental prosthetic device    temporary while waiting for implant   Skin cancer    skin cancer   Sleep apnea    Stroke ([place]) 08/2011   TIA     Family  History  Problem Relation Age of Onset   Hypertension Mother    Osteopenia Mother    Stroke Mother        deceased age 61   Colon cancer Father 52       mets   Allergies Sister    Breast cancer Cousin        pat cousin   Rectal cancer Neg Hx      Past Surgical History:  Procedure Laterality Date   ABDOMINAL HYSTERECTOMY     ANAL FISSURE REPAIR     CATARACT EXTRACTION W/PHACO Right 02/06/2018   Procedure: CATARACT EXTRACTION PHACO AND INTRAOCULAR LENS PLACEMENT ([place]) RIGHT;  Surgeon: Leandrew Koyanagi, MD;  Location: [facility];  Service: Ophthalmology;  Laterality: Right;   CHOLECYSTECTOMY  07/2006   LYMPH NODE BIOPSY Right 03/16/2021   Procedure: LYMPH NODE BIOPSY;  Surgeon: Katha Cabal, MD;  Location: [facility];  Service: Vascular;  Laterality: Right;   MOHS SURGERY  12/05/2006   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 Hulda Humphrey    Social History   Socioeconomic History   Marital status: Widowed    Spouse name: Not on file   Number of children: 2   Years of education: Not on file   Highest education level: Not on file  Occupational History   Occupation: Ecologist: RETIRED    Comment: 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     Married 1979---widowed 10/22   2  daughters from prev relationship   [employer] fan, [employer] fan.   Social Determinants of Health   Financial Resource Strain: Low Risk  (05/16/2021)   Overall Financial Resource Strain (CARDIA)    Difficulty of Paying Living Expenses: Not very hard  Food Insecurity: No Food Insecurity (  08/21/2019)   Hunger Vital Sign    Worried About Running Out of Food in the Last Year: Never true    Ran Out of Food in the Last Year: Never true  Transportation Needs: No Transportation Needs (08/21/2019)   PRAPARE - Hydrologist (Medical): No    Lack of Transportation (Non-Medical): No  Physical Activity: Inactive (08/21/2019)   Exercise Vital Sign    Days of Exercise per Week: 0 days    Minutes of Exercise per Session: 0 min  Stress: No Stress Concern Present (08/21/2019)   [place]    Feeling of Stress : Not at all  Social Connections: Not on file  Intimate Partner Violence: Not At Risk (08/21/2019)   Humiliation, Afraid, Rape, and Kick questionnaire    Fear of Current or Ex-Partner: No    Emotionally Abused: No    Physically Abused: No    Sexually Abused: No     Allergies  Allergen Reactions   Aurothioglucose Itching    Other reaction(s): Other (See Comments)   Celebrex [Celecoxib]     REACTION: nausea: GI UPSET   Doxycycline Other (See Comments)    GI upset- not an allergy.     Gold-Containing Drug Products    Nickel     Skin rash, allergy   Other     Fragrance, phenylenediamine, contrast metal agents, gold   Simvastatin Anxiety    Aches but tolerates crestor Other reaction(s): Other (See Comments) Aches but tolerates crestor Aches but tolerates crestor   Sodium Metabisulfite Other (See Comments), Nausea Only and Rash    Positive patch test Other reaction(s): Other (See Comments) Positive patch test Positive patch test Positive patch test     Outpatient Medications Prior to Visit  Medication  Sig Dispense Refill   albuterol (VENTOLIN HFA) 108 (90 Base) MCG/ACT inhaler Inhale 2 puffs into the lungs every 6 (six) hours as needed for wheezing or shortness of breath. 8 g 3   alendronate (FOSAMAX) 70 MG tablet Take 1 tablet (70 mg total) by mouth every 7 (seven) days. Take with a full glass of water on an empty stomach. 13 tablet 3   clobetasol ointment (TEMOVATE) 0.05 % Apply topically 2 (two) times daily.     D3 SUPER STRENGTH 50 MCG (2000 UT) CAPS TAKE TWO CAPSULES BY MOUTH DAILY 180 capsule 3   diltiazem (CARDIZEM) 30 MG tablet TAKE ONE TABLET BY MOUTH THREE TIMES DAILY AS NEEDED 90 tablet 1   folic acid (FOLVITE) 1 MG tablet Take 1 mg by mouth daily.     levothyroxine (SYNTHROID) 150 MCG tablet TAKE ONE TABLET BY MOUTH ON MONDAY THROUGH SATURDAY EACH WEEK and TAKE TWO TABLETS ONCE WEEKLY ON SUNDAY 110 tablet 3   loperamide (IMODIUM A-D) 2 MG tablet Take 2 mg by mouth 2 (two) times daily as needed for diarrhea or loose stools.     methotrexate 2.5 MG tablet TAKE 5 TABLETS BY MOUTH ONCE A WEEK - TAKE FOLIC ACID ALL OTHER DAYS OF THE WEEK     omeprazole (PRILOSEC) 20 MG capsule TAKE ONE CAPSULE BY MOUTH DAILY 90 capsule 3   promethazine (PHENERGAN) 25 MG tablet TAKE ONE TABLET BY MOUTH EVERY 6 HOURS AS NEEDED FOR NAUSEA 30 tablet 3   propranolol ER (INDERAL LA) 120 MG 24 hr capsule TAKE ONE CAPSULE BY MOUTH AT BEDTIME 90 capsule 3   rosuvastatin (CRESTOR) 5 MG tablet TAKE ONE TABLET  BY MOUTH AT BEDTIME 90 tablet 3   traMADol (ULTRAM) 50 MG tablet Take 1-2 tablets (50-100 mg total) by mouth every 12 (twelve) hours as needed. CAUTION SEDATION 50 tablet 1   XARELTO 20 MG TABS tablet TAKE ONE TABLET BY MOUTH IN THE EVENING WITH DINNER 90 tablet 3   No facility-administered medications prior to visit.    Review of Systems  Constitutional:  Negative for chills, fever, malaise/fatigue and weight loss.  HENT:  Negative for hearing loss, sore throat and tinnitus.   Eyes:  Negative for blurred  vision and double vision.  Respiratory:  Positive for shortness of breath. Negative for cough, hemoptysis, sputum production, wheezing and stridor.   Cardiovascular:  Negative for chest pain, palpitations, orthopnea, leg swelling and PND.  Gastrointestinal:  Negative for abdominal pain, constipation, diarrhea, heartburn, nausea and vomiting.  Genitourinary:  Negative for dysuria, hematuria and urgency.  Musculoskeletal:  Negative for joint pain and myalgias.  Skin:  Negative for itching and rash.  Neurological:  Negative for dizziness, tingling, weakness and headaches.  Endo/Heme/Allergies:  Negative for environmental allergies. Does not bruise/bleed easily.  Psychiatric/Behavioral:  Negative for depression. The patient is not nervous/anxious and does not have insomnia.   All other systems reviewed and are negative.    Objective:  Physical Exam Vitals reviewed.  Constitutional:      General: She is not in acute distress.    Appearance: She is well-developed. She is obese.  HENT:     Head: Normocephalic and atraumatic.  Eyes:     General: No scleral icterus.    Conjunctiva/sclera: Conjunctivae normal.     Pupils: Pupils are equal, round, and reactive to light.  Neck:     Vascular: No JVD.     Trachea: No tracheal deviation.  Cardiovascular:     Rate and Rhythm: Normal rate and regular rhythm.     Heart sounds: Normal heart sounds. No murmur heard. Pulmonary:     Effort: Pulmonary effort is normal. No tachypnea, accessory muscle usage or respiratory distress.     Breath sounds: Normal breath sounds. No stridor. No wheezing, rhonchi or rales.  Abdominal:     General: Bowel sounds are normal. There is no distension.     Palpations: Abdomen is soft.     Tenderness: There is no abdominal tenderness.  Musculoskeletal:        General: No tenderness.     Cervical back: Neck supple.     Right lower leg: Edema present.     Left lower leg: Edema present.  Lymphadenopathy:      Cervical: No cervical adenopathy.  Skin:    General: Skin is warm and dry.     Capillary Refill: Capillary refill takes less than 2 seconds.     Findings: Rash present.     Comments: Excoriating rash on the bilateral lower extremities that itches.  She is been scratching it a lot  Neurological:     Mental Status: She is alert and oriented to person, place, and time.  Psychiatric:        Behavior: Behavior normal.      Vitals:   06/22/22 1445  BP: (!) 140/80  Pulse: (!) 57  SpO2: 97%  Weight: 226 lb 6.4 oz (102.7 kg)  Height: '5\' 4"'$  (1.626 m)   97% on RA BMI Readings from Last 3 Encounters:  06/22/22 38.86 kg/m  11/25/21 39.21 kg/m  10/13/21 38.96 kg/m   Wt Readings from Last 3 Encounters:  06/22/22 226  lb 6.4 oz (102.7 kg)  11/25/21 232 lb (105.2 kg)  10/13/21 227 lb (103 kg)     CBC    Component Value Date/Time   WBC 8.2 09/20/2021 1533   RBC 5.62 (H) 09/20/2021 1533   HGB 14.1 09/20/2021 1533   HGB 13.8 08/26/2011 1334   HCT 43.5 09/20/2021 1533   HCT 41.2 08/26/2011 1334   PLT 223.0 09/20/2021 1533   PLT 156 08/26/2011 1334   MCV 77.4 (L) 09/20/2021 1533   MCV 83 08/26/2011 1334   MCH 25.5 (L) 04/07/2021 1231   MCHC 32.5 09/20/2021 1533   RDW 18.3 (H) 09/20/2021 1533   RDW 13.8 08/26/2011 1334   LYMPHSABS 1.8 09/20/2021 1533   MONOABS 1.0 09/20/2021 1533   EOSABS 0.0 09/20/2021 1533   BASOSABS 0.0 09/20/2021 1533    Chest Imaging: 07/29/2018 CTA chest 10 mm right lower lobe pulmonary nodule, 5 mm right upper lobe pulmonary nodule, left upper lobe pulmonary nodule The patient's images have been independently reviewed by me.    June 2020 CT chest: Resolved pulmonary nodule as compared to previous.  New upper lobe pulmonary nodule.  Recommending 55-monthfollow-up. The patient's images have been independently reviewed by me.    CT scan of the chest January 2022: Bilateral waxing and waning pulmonary nodules largest left lower lobe approximately 1  cm in size. The patient's images have been independently reviewed by me.    August 2023 CT: Stable lung nodules \ The patient's images have been independently reviewed by me.    Pulmonary Functions Testing Results:    Latest Ref Rng & Units 12/18/2018    1:49 PM  PFT Results  FVC-Pre L 1.78   FVC-Predicted Pre % 59   FVC-Post L 1.81   FVC-Predicted Post % 60   Pre FEV1/FVC % % 77   Post FEV1/FCV % % 75   FEV1-Pre L 1.37   FEV1-Predicted Pre % 61   FEV1-Post L 1.36   DLCO uncorrected ml/min/mmHg 14.66   DLCO [facility]% % 74   DLVA Predicted % 98   TLC L 4.26   TLC % Predicted % 83   RV % Predicted % 106     FeNO: None   Pathology: None   Echocardiogram:  10/27/2016: Normal ejection fraction 60 to 65%, mild LVH. 08/16/2018: 1. The left ventricle has normal systolic function, with an ejection fraction of 55-60%. The cavity size was normal. Left ventricular diastolic parameters were normal.  2. The right ventricle has normal systolic function. The cavity was normal. There is no increase in right ventricular wall thickness.  3. Left atrial size was moderately dilated.  4. Right atrial size was mildly dilated.  5. The mitral valve is degenerative. Moderate thickening of the mitral valve leaflet. Mild calcification of the mitral valve leaflet.  6. The tricuspid valve is normal in structure.  7. The aortic valve is tricuspid Moderate thickening of the aortic valve Moderate sclerosis of the aortic valve.  Heart Catheterization: None     Assessment & Plan:     ICD-10-CM   1. Multiple pulmonary nodules  R91.8     2. OSA on CPAP  G47.33     3. Former smoker  Z87.891     4. [place] ([place]  E66.01     5. BMI 40.0-44.9, adult ([facility]  Z68.41       Discussion:  This is a 77year old female, past medical history of pulmonary nodules, incidentally found.  We have been  watching for several times they have been stable and waxed and waned.  Pulmonary function test with prism  pattern likely related to her obesity and BMI.  She is on CPAP with her OSA followed by Dr. Jenetta Downer in sleep clinic.  Plan: Continue compliance with CPAP machine and follow-up with Dr. Jenetta Downer. Can follow-up with [place] after her repeat CT chest in August 2024. We talked a lot today about weight loss plans. I encouraged her to keep track of her mobility and consideration for getting a pedometer. She is going to try to cut back on drinking as many Pepsi's as she drinks throughout the day. Can continue albuterol as needed but she rarely uses this.    Current Outpatient Medications:    albuterol (VENTOLIN HFA) 108 (90 Base) MCG/ACT inhaler, Inhale 2 puffs into the lungs every 6 (six) hours as needed for wheezing or shortness of breath., Disp: 8 g, Rfl: 3   alendronate (FOSAMAX) 70 MG tablet, Take 1 tablet (70 mg total) by mouth every 7 (seven) days. Take with a full glass of water on an empty stomach., Disp: 13 tablet, Rfl: 3   clobetasol ointment (TEMOVATE) 0.05 %, Apply topically 2 (two) times daily., Disp: , Rfl:    D3 SUPER STRENGTH 50 MCG (2000 UT) CAPS, TAKE TWO CAPSULES BY MOUTH DAILY, Disp: 180 capsule, Rfl: 3   diltiazem (CARDIZEM) 30 MG tablet, TAKE ONE TABLET BY MOUTH THREE TIMES DAILY AS NEEDED, Disp: 90 tablet, Rfl: 1   folic acid (FOLVITE) 1 MG tablet, Take 1 mg by mouth daily., Disp: , Rfl:    levothyroxine (SYNTHROID) 150 MCG tablet, TAKE ONE TABLET BY MOUTH ON MONDAY THROUGH SATURDAY EACH WEEK and TAKE TWO TABLETS ONCE WEEKLY ON SUNDAY, Disp: 110 tablet, Rfl: 3   loperamide (IMODIUM A-D) 2 MG tablet, Take 2 mg by mouth 2 (two) times daily as needed for diarrhea or loose stools., Disp: , Rfl:    methotrexate 2.5 MG tablet, TAKE 5 TABLETS BY MOUTH ONCE A WEEK - TAKE FOLIC ACID ALL OTHER DAYS OF THE WEEK, Disp: , Rfl:    omeprazole (PRILOSEC) 20 MG capsule, TAKE ONE CAPSULE BY MOUTH DAILY, Disp: 90 capsule, Rfl: 3   promethazine (PHENERGAN) 25 MG tablet, TAKE ONE TABLET BY MOUTH EVERY 6 HOURS AS  NEEDED FOR NAUSEA, Disp: 30 tablet, Rfl: 3   propranolol ER (INDERAL LA) 120 MG 24 hr capsule, TAKE ONE CAPSULE BY MOUTH AT BEDTIME, Disp: 90 capsule, Rfl: 3   rosuvastatin (CRESTOR) 5 MG tablet, TAKE ONE TABLET BY MOUTH AT BEDTIME, Disp: 90 tablet, Rfl: 3   traMADol (ULTRAM) 50 MG tablet, Take 1-2 tablets (50-100 mg total) by mouth every 12 (twelve) hours as needed. CAUTION SEDATION, Disp: 50 tablet, Rfl: 1   XARELTO 20 MG TABS tablet, TAKE ONE TABLET BY MOUTH IN THE EVENING WITH DINNER, Disp: 90 tablet, Rfl: 3   Garner Nash, DO [facility] 06/22/2022 3:07 PM

## 2022-06-22 NOTE — Patient Instructions (Signed)
Thank you for visiting Dr. Valeta Harms at [facility]. Today we recommend the following:  Return to clinic in 8 months after your follow-up CT complete in August 2024.  Return in about 8 months (around 02/21/2023) for with Eric Form, NP.    Please do your part to reduce the spread of COVID-19.

## 2022-06-27 ENCOUNTER — Telehealth: Payer: Self-pay | Admitting: Family Medicine

## 2022-06-27 NOTE — Telephone Encounter (Signed)
error 

## 2022-06-28 ENCOUNTER — Encounter: Payer: Self-pay | Admitting: Internal Medicine

## 2022-06-28 ENCOUNTER — Ambulatory Visit (INDEPENDENT_AMBULATORY_CARE_PROVIDER_SITE_OTHER): Payer: PPO | Admitting: Internal Medicine

## 2022-06-28 ENCOUNTER — Ambulatory Visit: Payer: PPO | Admitting: Internal Medicine

## 2022-06-28 VITALS — BP 138/78 | HR 72 | Temp 98.1°F | Ht 64.0 in | Wt 228.0 lb

## 2022-06-28 DIAGNOSIS — S20212A Contusion of left front wall of thorax, initial encounter: Secondary | ICD-10-CM

## 2022-06-28 NOTE — Progress Notes (Signed)
Subjective:    Patient ID: Kara Wright, female    DOB: [birth year], 77 y.o.   MRN: [id number]  HPI Here due to pain after a fall  Was trying to move dog's dish on step with her foot---almost a week ago Lost balance and fell out door to the patio Able to get up on her own---first to knees and then stood May have hit left lower ribs on steps Bad pain with cough/sneeze/deep breath Trouble in bed  Tried tylenol '1000mg'$  --mostly at night  Scratched up left arm/elbow This seems to be improved Some pain when she puts it down Can use it normally  Current Outpatient Medications on File Prior to Visit  Medication Sig Dispense Refill   albuterol (VENTOLIN HFA) 108 (90 Base) MCG/ACT inhaler Inhale 2 puffs into the lungs every 6 (six) hours as needed for wheezing or shortness of breath. 8 g 3   alendronate (FOSAMAX) 70 MG tablet Take 1 tablet (70 mg total) by mouth every 7 (seven) days. Take with a full glass of water on an empty stomach. 13 tablet 3   clobetasol ointment (TEMOVATE) 0.05 % Apply topically 2 (two) times daily.     D3 SUPER STRENGTH 50 MCG (2000 UT) CAPS TAKE TWO CAPSULES BY MOUTH DAILY 180 capsule 3   diltiazem (CARDIZEM) 30 MG tablet TAKE ONE TABLET BY MOUTH THREE TIMES DAILY AS NEEDED 90 tablet 1   folic acid (FOLVITE) 1 MG tablet Take 1 mg by mouth daily.     levothyroxine (SYNTHROID) 150 MCG tablet TAKE ONE TABLET BY MOUTH ON MONDAY THROUGH SATURDAY EACH WEEK and TAKE TWO TABLETS ONCE WEEKLY ON SUNDAY 110 tablet 3   loperamide (IMODIUM A-D) 2 MG tablet Take 2 mg by mouth 2 (two) times daily as needed for diarrhea or loose stools.     methotrexate 2.5 MG tablet TAKE 5 TABLETS BY MOUTH ONCE A WEEK - TAKE FOLIC ACID ALL OTHER DAYS OF THE WEEK     omeprazole (PRILOSEC) 20 MG capsule TAKE ONE CAPSULE BY MOUTH DAILY 90 capsule 3   promethazine (PHENERGAN) 25 MG tablet TAKE ONE TABLET BY MOUTH EVERY 6 HOURS AS NEEDED FOR NAUSEA 30 tablet 3   propranolol ER (INDERAL LA) 120 MG  24 hr capsule TAKE ONE CAPSULE BY MOUTH AT BEDTIME 90 capsule 3   rosuvastatin (CRESTOR) 5 MG tablet TAKE ONE TABLET BY MOUTH AT BEDTIME 90 tablet 3   traMADol (ULTRAM) 50 MG tablet Take 1-2 tablets (50-100 mg total) by mouth every 12 (twelve) hours as needed. CAUTION SEDATION 50 tablet 1   XARELTO 20 MG TABS tablet TAKE ONE TABLET BY MOUTH IN THE EVENING WITH DINNER 90 tablet 3   No current facility-administered medications on file prior to visit.    Allergies  Allergen Reactions   Aurothioglucose Itching    Other reaction(s): Other (See Comments)   Celebrex [Celecoxib]     REACTION: nausea: GI UPSET   Doxycycline Other (See Comments)    GI upset- not an allergy.     Gold-Containing Drug Products    Nickel     Skin rash, allergy   Other     Fragrance, phenylenediamine, contrast metal agents, gold   Simvastatin Anxiety    Aches but tolerates crestor Other reaction(s): Other (See Comments) Aches but tolerates crestor Aches but tolerates crestor   Sodium Metabisulfite Other (See Comments), Nausea Only and Rash    Positive patch test Other reaction(s): Other (See Comments) Positive patch test Positive  patch test Positive patch test    Past Medical History:  Diagnosis Date   Atrial fib/flutter, transient    Cataract 2019   left eye   Clotting disorder ([place])    on xarelto   Diverticulosis 03/1997   Dyspnea    Dysrhythmia    atrial fibrilation   GERD (gastroesophageal reflux disease) 1997   good on prilosec   Hepatitis    hx of hep B (late 1970s)   Hyperlipidemia 08/1996   Hypothyroidism 1984   Migraine    w/o aura   Osteopenia 11/2002   via dexa   Presence of dental prosthetic device    temporary while waiting for implant   Skin cancer    skin cancer   Sleep apnea    Stroke ([place]) 08/2011   TIA    Past Surgical History:  Procedure Laterality Date   ABDOMINAL HYSTERECTOMY     ANAL FISSURE REPAIR     CATARACT EXTRACTION W/PHACO Right 02/06/2018   Procedure:  CATARACT EXTRACTION PHACO AND INTRAOCULAR LENS PLACEMENT ([place]) RIGHT;  Surgeon: Leandrew Koyanagi, MD;  Location: [facility];  Service: Ophthalmology;  Laterality: Right;   CHOLECYSTECTOMY  07/2006   LYMPH NODE BIOPSY Right 03/16/2021   Procedure: LYMPH NODE BIOPSY;  Surgeon: Katha Cabal, MD;  Location: [facility];  Service: Vascular;  Laterality: Right;   MOHS SURGERY  12/05/2006   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 Hulda Humphrey    Family History  Problem Relation Age of Onset   Hypertension Mother    Osteopenia Mother    Stroke Mother        deceased age 46   Colon cancer Father 57       mets   Allergies Sister    Breast cancer Cousin        pat cousin   Rectal cancer Neg Hx     Social History   Socioeconomic History   Marital status: Widowed    Spouse name: Not on file   Number of children: 2   Years of education: Not on file   Highest education level: Not on file  Occupational History   Occupation: Ecologist: RETIRED    Comment: 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     Married 1979---widowed 10/22   2 daughters from prev relationship   [employer] fan, [employer] fan.   Social Determinants of Health   Financial Resource Strain: Low Risk  (05/16/2021)   Overall  Financial Resource Strain (CARDIA)    Difficulty of Paying Living Expenses: Not very hard  Food Insecurity: No Food Insecurity (08/21/2019)   Hunger Vital Sign  Worried About Charity fundraiser in the Last Year: Never true    [place] in the Last Year: Never true  Transportation Needs: No Transportation Needs (08/21/2019)   PRAPARE - Hydrologist (Medical): No    Lack of Transportation (Non-Medical): No  Physical Activity: Inactive (08/21/2019)   Exercise Vital Sign    Days of Exercise per Week: 0 days    Minutes of Exercise per Session: 0 min  Stress: No Stress Concern Present (08/21/2019)   [place]    Feeling of Stress : Not at all  Social Connections: Not on file  Intimate Partner Violence: Not At Risk (08/21/2019)   Humiliation, Afraid, Rape, and Kick questionnaire    Fear of Current or Ex-Partner: No    Emotionally Abused: No    Physically Abused: No    Sexually Abused: No   Review of Systems Chronic SOB--no change No fever Did have nausea the morning after the fall---related it to pain     Objective:   Physical Exam Constitutional:      Appearance: Normal appearance.  Cardiovascular:     Rate and Rhythm: Normal rate and regular rhythm.     Heart sounds: No murmur heard.    No gallop.  Pulmonary:     Effort: Pulmonary effort is normal.     Breath sounds: Normal breath sounds. No wheezing or rales.     Comments: Localized tenderness over lower left ribs in anterior axillary line Musculoskeletal:     Cervical back: Neck supple.     Comments: Normal strength and ROM in left arm---just some old bruising by elbow (around granulated skin areas)  Lymphadenopathy:     Cervical: No cervical adenopathy.  Neurological:     Mental Status: She is alert.            Assessment & Plan:

## 2022-06-28 NOTE — Assessment & Plan Note (Addendum)
Now 1 week out  Fracture very unlikely but Rx wouldn't change On xarelto so may have more symptoms with bleed--but nothing to suggest pleural effusion Discussed 2 aleve at bedtime for the next few days (normal kidney function other than 1 time with GI bug) Can try heat Can continue tylenol Has done better sleeping in the recliner  If worsens, will check x-ray

## 2022-07-05 ENCOUNTER — Other Ambulatory Visit: Payer: Self-pay | Admitting: Family Medicine

## 2022-07-05 NOTE — Telephone Encounter (Signed)
Patient has been scheduled

## 2022-07-05 NOTE — Telephone Encounter (Signed)
Patient due for medicare wellness visit in April; please call patient to schedule appt.

## 2022-07-11 ENCOUNTER — Ambulatory Visit: Payer: Self-pay

## 2022-07-11 NOTE — Patient Outreach (Signed)
  Care Coordination   Follow Up Visit Note   07/11/2022 Name: Shaquavia Whisonant MRN: [id number] DOB: 02-24-1946  Lois Slagel is a 77 y.o. year old female who sees Tonia Ghent, MD for primary care. I spoke with  [name] by phone today.  What matters to the patients health and wellness today?  Ongoing management of fatigue and shortness of breath.     Goals Addressed             This Visit's Progress    Patient stated: " managing lack of energy and shortness of breath"       Care Coordination Interventions: Evaluation of current treatment plan related to fatigue and shortness of breath and patient's adherence to plan as established by provider: Patient reports having follow up visit with pulmonologist on 06/22/22.  Denies any treatment change.  Patient states provider advised her to start walking 10,000 steps per day.   Patient states she continues to eat more vegetables and fruit. She reports drinking 3-4 bottles of water per day and has decreased her soda intake to just 1 per day.  Patient reports sustaining a fall outside on 06/22/22.  She states she saw the primary provider for follow up.  Feeling better at this time.  Reviewed medications with patient and discussed importance of medication compliance Reviewed scheduled/upcoming provider appointments  Reviewed with patient ways to continue managing fatigue:  healthy diet, hydration, increase activity level as tolerated.  Encouraged patient to start slow with incorporating exercise in daily routine.  Advised to do 2-3 10 min walks per day.  Re-discussed conserving energy throughout the day by doing heavy housework, shopping, laundry when you have the most energy. Spread household chores throughout the week and not all in one day.  Discussed ongoing follow up for care coordination: Patient verbally agreed to next telephone follow up with RNCM for 09/19/22.           SDOH assessments and interventions completed:  No      Care Coordination Interventions:  Yes, provided   Follow up plan: Follow up call scheduled for 4.2.24    Encounter Outcome:  Pt. Visit Completed   Quinn Plowman RN,BSN,CCM [facility] [phone] direct line

## 2022-07-11 NOTE — Telephone Encounter (Signed)
This encounter was created in error - please disregard.

## 2022-08-10 DIAGNOSIS — R591 Generalized enlarged lymph nodes: Secondary | ICD-10-CM | POA: Diagnosis not present

## 2022-08-17 ENCOUNTER — Other Ambulatory Visit: Payer: Self-pay | Admitting: Family Medicine

## 2022-08-17 DIAGNOSIS — D2261 Melanocytic nevi of right upper limb, including shoulder: Secondary | ICD-10-CM | POA: Diagnosis not present

## 2022-08-17 DIAGNOSIS — D225 Melanocytic nevi of trunk: Secondary | ICD-10-CM | POA: Diagnosis not present

## 2022-08-17 DIAGNOSIS — Z08 Encounter for follow-up examination after completed treatment for malignant neoplasm: Secondary | ICD-10-CM | POA: Diagnosis not present

## 2022-08-17 DIAGNOSIS — Z85828 Personal history of other malignant neoplasm of skin: Secondary | ICD-10-CM | POA: Diagnosis not present

## 2022-08-17 DIAGNOSIS — D2262 Melanocytic nevi of left upper limb, including shoulder: Secondary | ICD-10-CM | POA: Diagnosis not present

## 2022-08-17 DIAGNOSIS — L570 Actinic keratosis: Secondary | ICD-10-CM | POA: Diagnosis not present

## 2022-09-04 ENCOUNTER — Ambulatory Visit (INDEPENDENT_AMBULATORY_CARE_PROVIDER_SITE_OTHER): Payer: PPO | Admitting: Family Medicine

## 2022-09-04 ENCOUNTER — Encounter: Payer: Self-pay | Admitting: Family Medicine

## 2022-09-04 VITALS — BP 120/76 | HR 65 | Temp 97.8°F | Ht 64.0 in | Wt 177.0 lb

## 2022-09-04 DIAGNOSIS — J069 Acute upper respiratory infection, unspecified: Secondary | ICD-10-CM | POA: Diagnosis not present

## 2022-09-04 MED ORDER — AMOXICILLIN-POT CLAVULANATE 875-125 MG PO TABS: 1.0000 | ORAL_TABLET | Freq: Two times a day (BID) | ORAL | 0 refills | Status: DC

## 2022-09-04 MED ORDER — BENZONATATE 200 MG PO CAPS: 200.0000 mg | ORAL_CAPSULE | Freq: Three times a day (TID) | ORAL | 1 refills | Status: DC | PRN

## 2022-09-04 MED ORDER — LORATADINE 10 MG PO TABS: 10.0000 mg | ORAL_TABLET | Freq: Every day | ORAL | Status: DC

## 2022-09-04 NOTE — Progress Notes (Unsigned)
duration of symptoms: 3 weeks.   rhinorrhea: yes congestion: yes ear pain: some prev, occ.   sore throat: yes cough: throat clearing, discolored sputum.   myalgias: not today but prev diffusely sore.   Possible fever last night, was sweating.  Some chills.   No vomiting, no diarrhea.  Rare use of SABA. Itchy eyes with discharge, esp in AM.  No h/o springtime allergies.    Per HPI unless specifically indicated in ROS section   Meds, vitals, and allergies reviewed.   GEN: nad, alert and oriented HEENT: mucous membranes moist, R TM w/o erythema, L TM pinkish, nasal epithelium injected, OP with cobblestoning NECK: supple w/o LA CV: rrr. PULM: ctab, no inc wob EXT: no edema R max sinus ttp.

## 2022-09-04 NOTE — Patient Instructions (Signed)
Rest, fluids, plain claritin.  Star augmentin and update [place] as needed.  Take care.  Glad to see you.

## 2022-09-06 DIAGNOSIS — J069 Acute upper respiratory infection, unspecified: Secondary | ICD-10-CM | POA: Insufficient documentation

## 2022-09-06 NOTE — Assessment & Plan Note (Signed)
Presumed sinusitis.  Nontoxic.  Okay for outpatient follow-up. Rest, fluids, plain claritin.  Star augmentin and update [place] as needed.

## 2022-09-15 ENCOUNTER — Other Ambulatory Visit: Payer: Self-pay | Admitting: Family Medicine

## 2022-09-19 ENCOUNTER — Ambulatory Visit: Payer: Self-pay

## 2022-09-19 NOTE — Patient Outreach (Signed)
  Care Coordination   09/19/2022 Name: Kara Wright MRN: [id number] DOB: Nov 17, 1945   Care Coordination Outreach Attempts:  Successful outreach to patient.  Patient states she will not be able to keep telephone appointment with [facility] today because she is still at work.  Patient request call back next week.   Follow Up Plan:  Additional outreach attempts will be made to offer the patient care coordination information and services.   Encounter Outcome:  Pt. Request to Call Back   Care Coordination Interventions:  No, not indicated    Quinn Plowman [facility] [place] [phone] direct line

## 2022-09-24 ENCOUNTER — Other Ambulatory Visit: Payer: Self-pay | Admitting: Family Medicine

## 2022-09-24 DIAGNOSIS — I480 Paroxysmal atrial fibrillation: Secondary | ICD-10-CM

## 2022-09-24 DIAGNOSIS — M810 Age-related osteoporosis without current pathological fracture: Secondary | ICD-10-CM

## 2022-09-24 DIAGNOSIS — I10 Essential (primary) hypertension: Secondary | ICD-10-CM

## 2022-09-25 ENCOUNTER — Ambulatory Visit: Payer: Self-pay

## 2022-09-25 NOTE — Patient Outreach (Signed)
  Care Coordination   Follow Up Visit Note   09/25/2022 Name: Kara Wright MRN: [id number] DOB: 1945/10/09  Kara Wright is a 77 y.o. year old female who sees Kara Nam, MD for primary care. I spoke with  Kara Wright by phone today.  What matters to the patients health and wellness today?  Patient states she is doing well. She states she started back on her job.  Reports adhering to healthy diet. She states she is drinking more water and only having 1 soda per day. She reports shortness of breath has gotten better. Denies any further needs at this time.  Advised to contact primary care provider or RNCM if care coordination services needed in the future.     Goals Addressed             This Visit's Progress    COMPLETED: Patient stated: " managing lack of energy and shortness of breath"       Interventions Today    Flowsheet Row Most Recent Value  Chronic Disease   Chronic disease during today's visit Other  [fatigue, shortness of breath.]  General Interventions   General Interventions Discussed/Reviewed General Interventions Reviewed, Doctor Visits  [evaluation of current treatment plan for fatigue and shortness of breath and patients adherence to plan as recommended by provider. Assessed for ongoing fatigue/ shortness of breath symptoms.]  Doctor Visits Discussed/Reviewed Doctor Visits Reviewed  [reviewed scheduled/ upcoming visits]  Exercise Interventions   Exercise Discussed/Reviewed Physical Activity  Physical Activity Discussed/Reviewed Physical Activity Reviewed  [discussed physical activity.]  Nutrition Interventions   Nutrition Discussed/Reviewed Nutrition Reviewed  Pharmacy Interventions   Pharmacy Dicussed/Reviewed Pharmacy Topics Reviewed  [medications discussed and compliance reviewed.]               SDOH assessments and interventions completed:  No     Care Coordination Interventions:  Yes, provided   Follow up plan: No further  intervention required.   Encounter Outcome:  Pt. Visit Completed   George Ina RN,BSN,CCM [facility] Care Coordination [phone] direct line

## 2022-09-26 IMAGING — DX DG FOREARM 2V*R*
2 series · 2 of 2 positions shown · non-contrast
Comparison: None.

CLINICAL DATA: Wrist forearm pain.  Fall 1 month ago.

EXAM:
RIGHT FOREARM - 2 VIEW

[forearm ap]
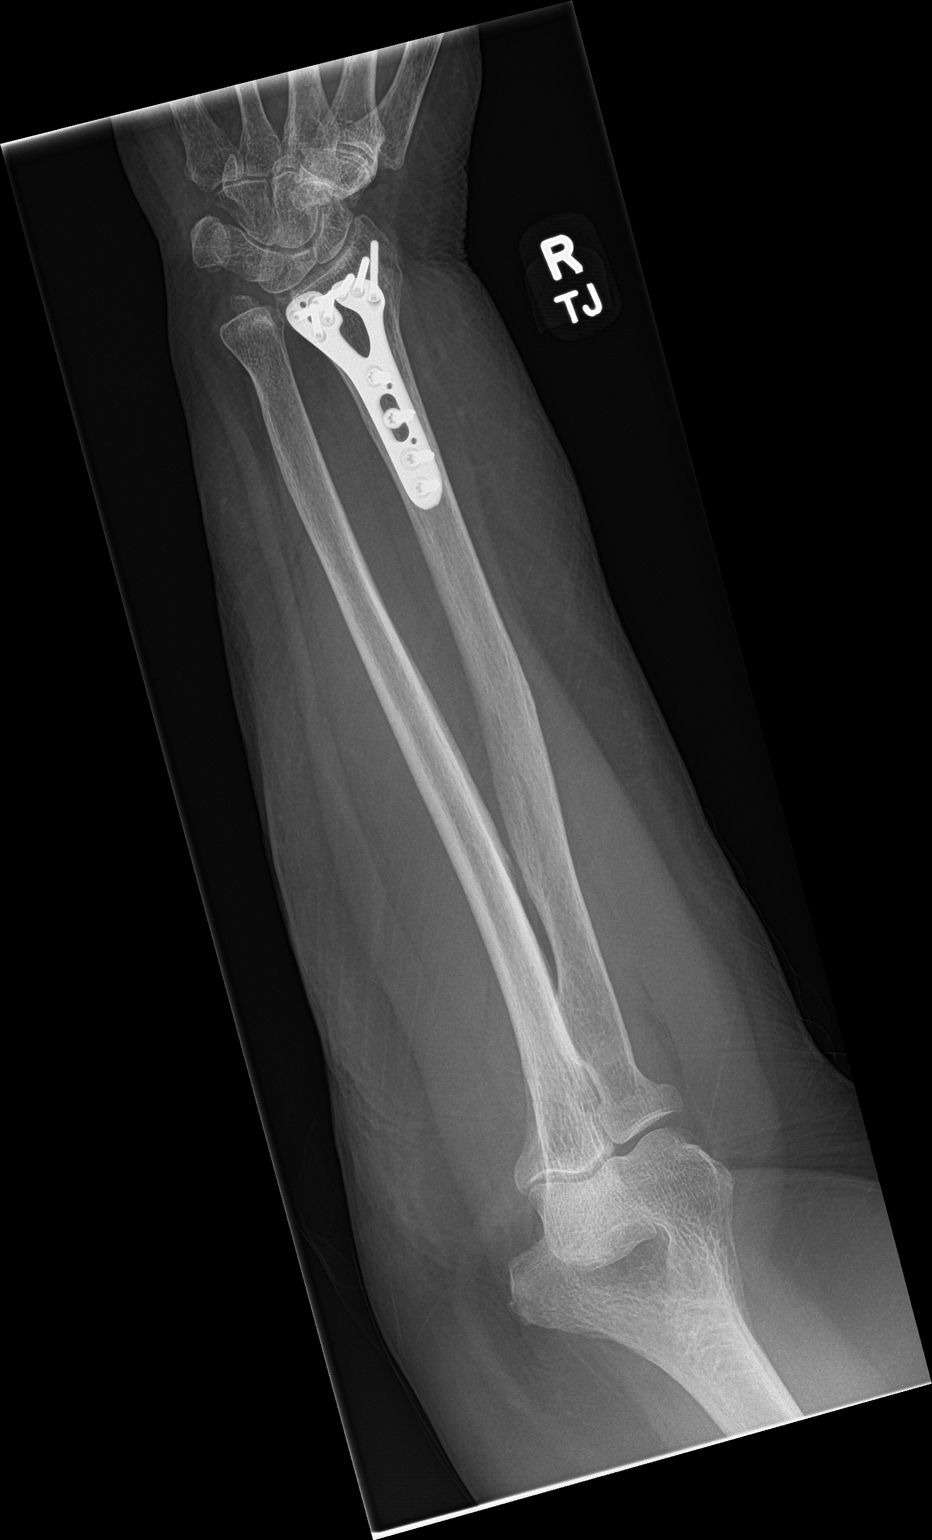

[forearm lat]
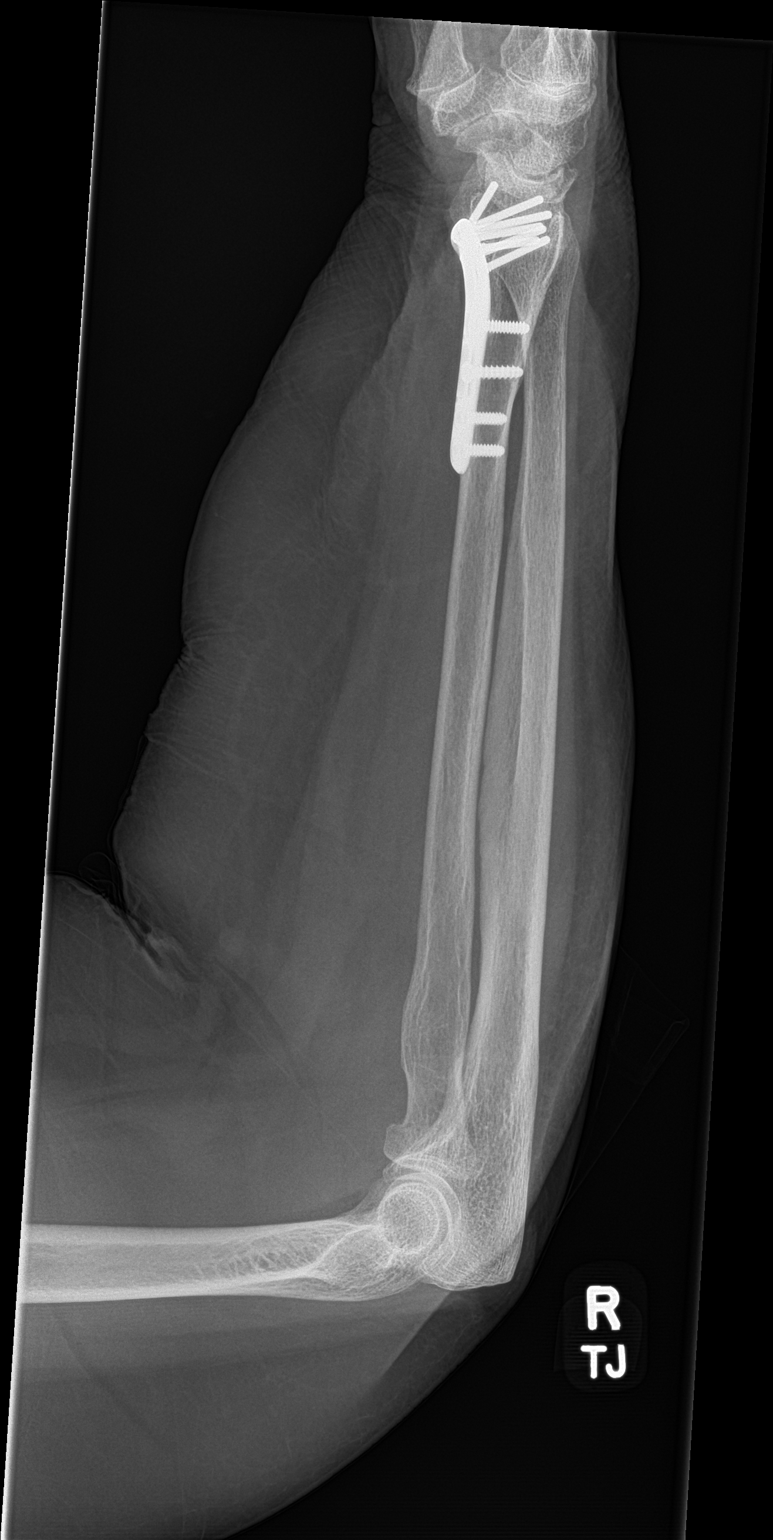

[2 of 2 positions shown; findings below may reference images not displayed]

FINDINGS: Plate and screw fixation seen in the distal right radius. No visible
acute fracture, subluxation or dislocation. No hardware complicating
feature.
IMPRESSION: Postoperative changes in the distal right radius. No acute bony
abnormality.

## 2022-09-26 IMAGING — DX DG WRIST COMPLETE 3+V*R*
4 series · 4 of 4 positions shown · non-contrast
Comparison: Right forearm 09/28/2020

CLINICAL DATA: Right wrist and forearm pain.  History of fall.

EXAM:
RIGHT WRIST - COMPLETE 3+ VIEW

[wrist ap]
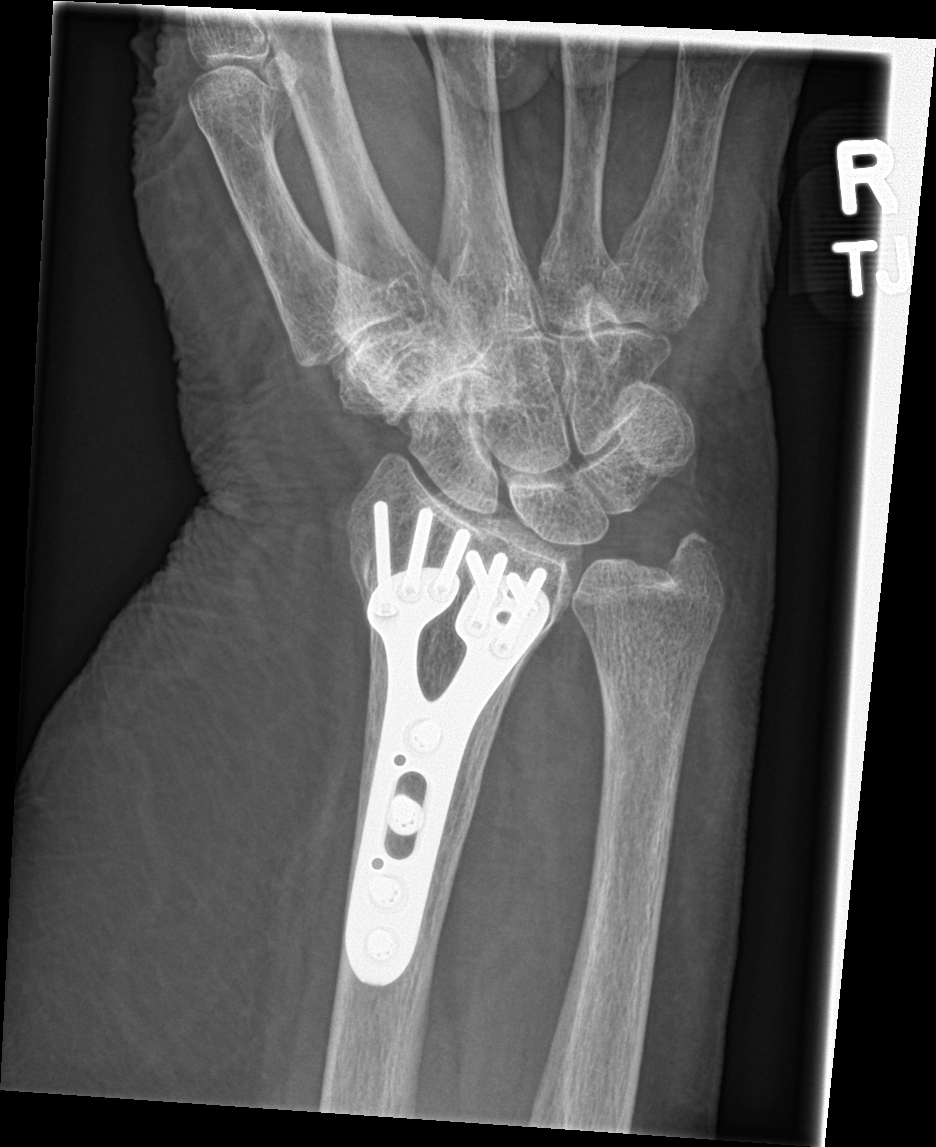

[wrist obl]
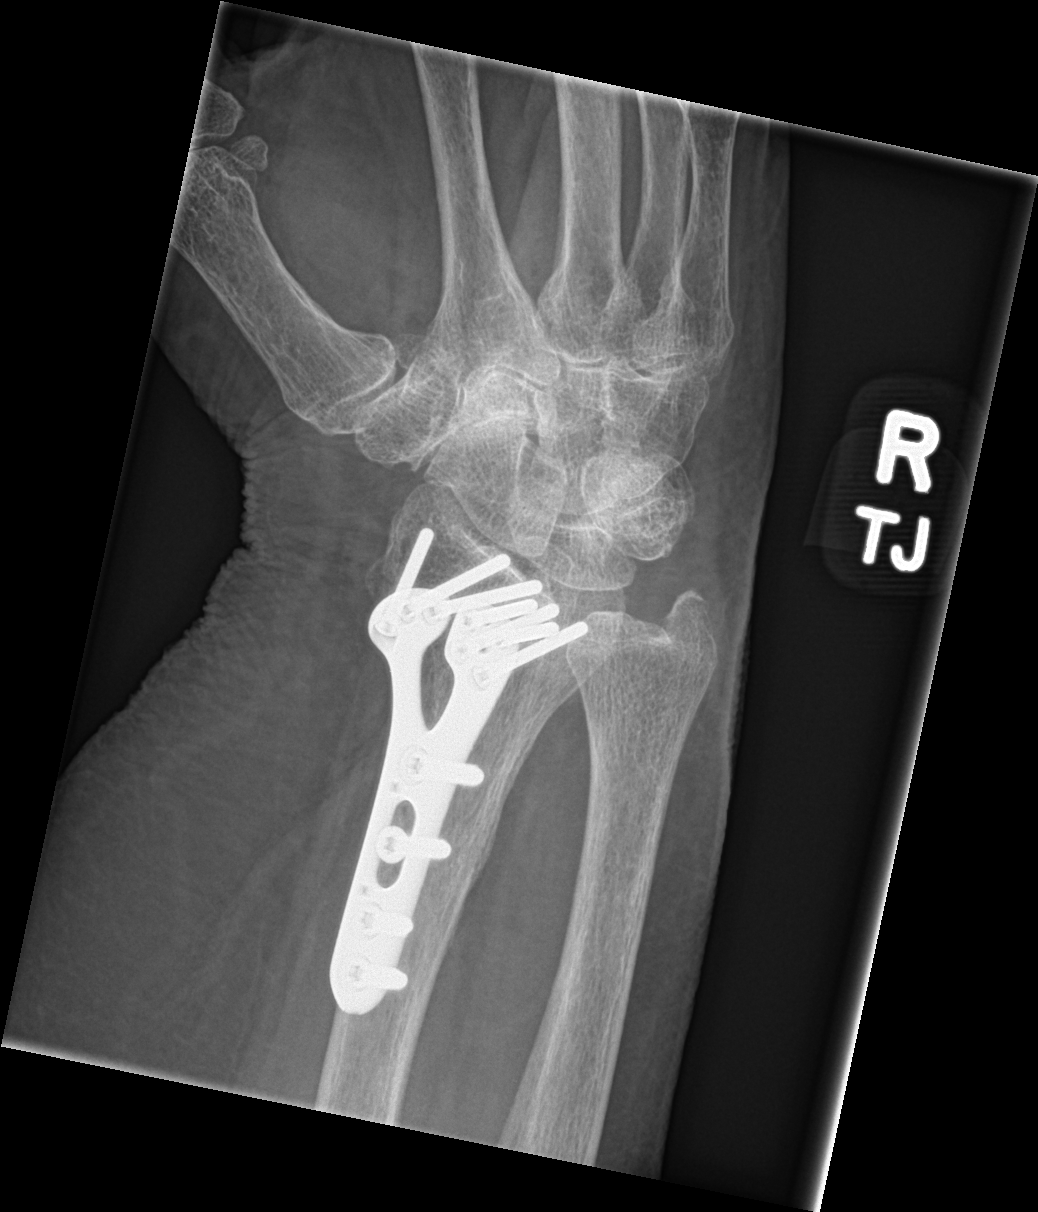

[wrist lat]
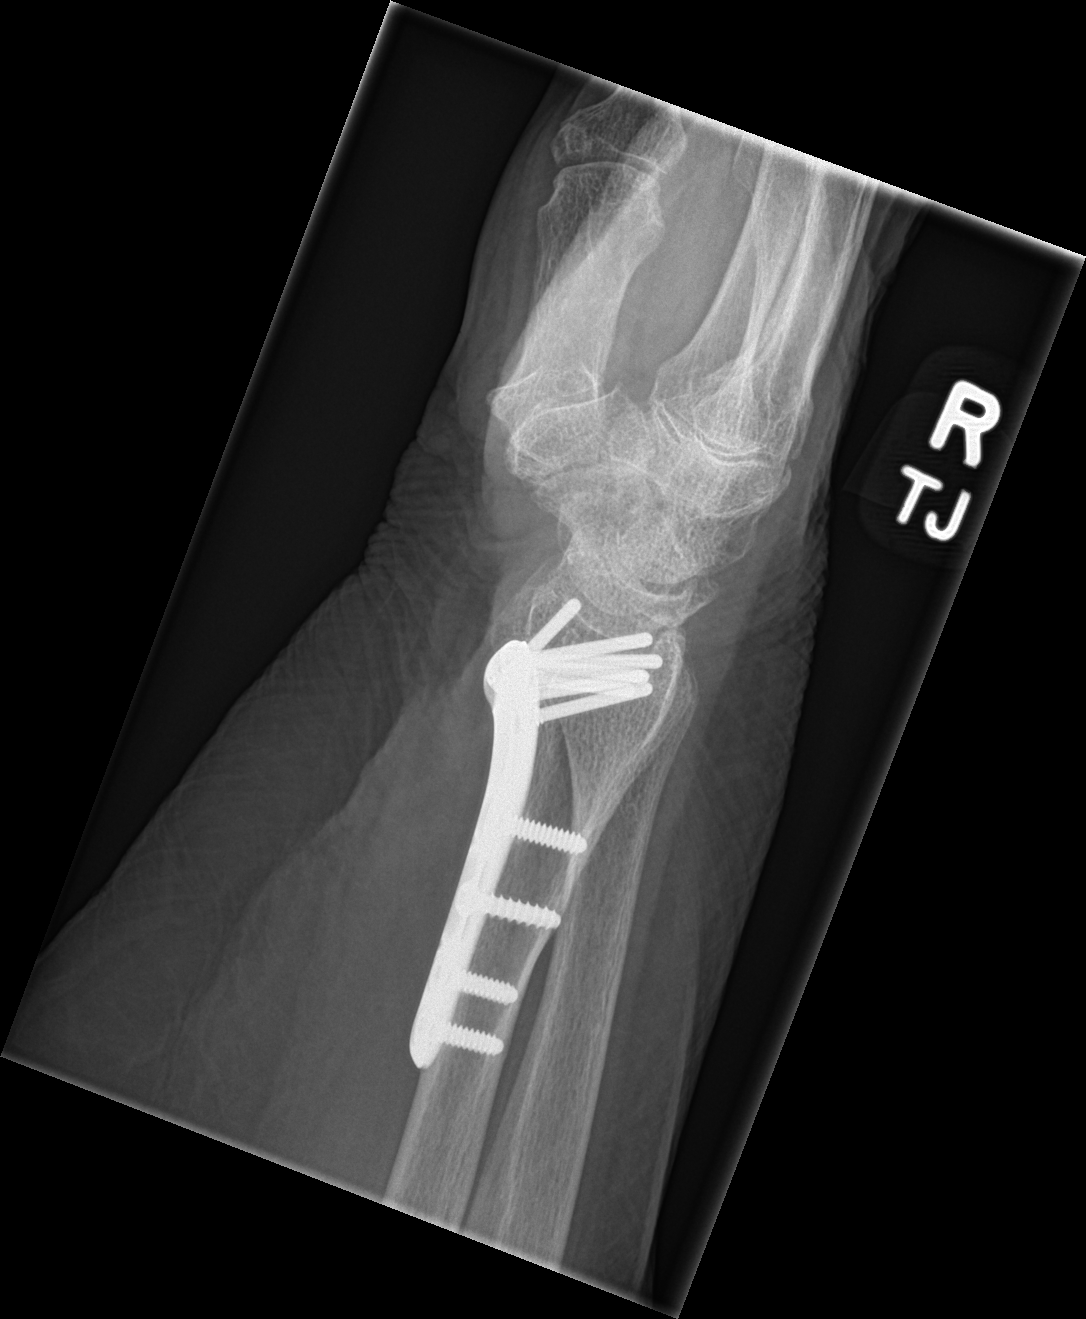

[wrist pa]
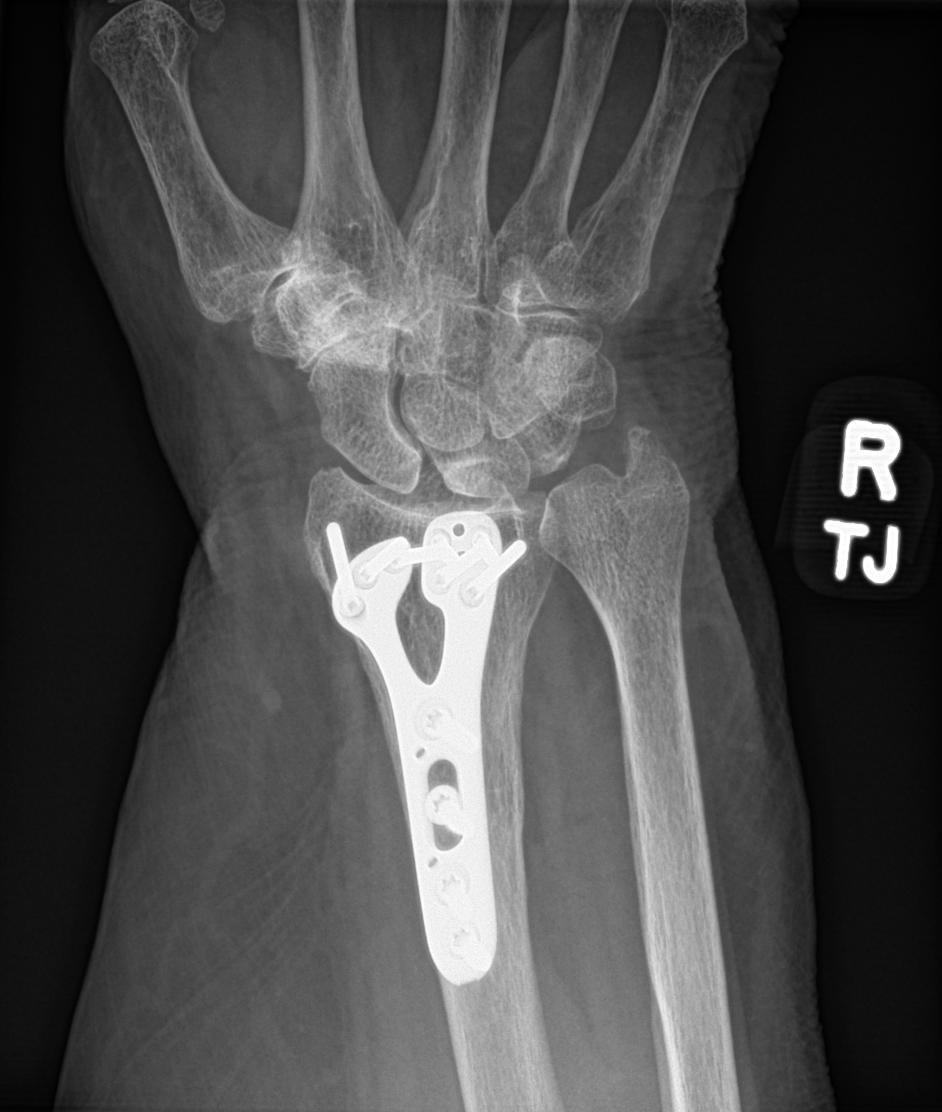

[4 of 4 positions shown; findings below may reference images not displayed]

FINDINGS: Surgical plate and screws along the volar aspect of the distal
radius. Surgical hardware is intact. Negative for acute fracture or
dislocation in the right wrist. Sclerosis and degenerative changes
at the STT joint.
IMPRESSION: 1. No acute bone abnormality to the right wrist. Postsurgical
changes as described.
2. Degenerative changes involving the STT joint.

## 2022-09-29 DIAGNOSIS — H6121 Impacted cerumen, right ear: Secondary | ICD-10-CM | POA: Diagnosis not present

## 2022-09-29 DIAGNOSIS — H903 Sensorineural hearing loss, bilateral: Secondary | ICD-10-CM | POA: Diagnosis not present

## 2022-10-09 ENCOUNTER — Other Ambulatory Visit (INDEPENDENT_AMBULATORY_CARE_PROVIDER_SITE_OTHER): Payer: PPO

## 2022-10-09 DIAGNOSIS — M810 Age-related osteoporosis without current pathological fracture: Secondary | ICD-10-CM

## 2022-10-09 DIAGNOSIS — I480 Paroxysmal atrial fibrillation: Secondary | ICD-10-CM | POA: Diagnosis not present

## 2022-10-09 DIAGNOSIS — I10 Essential (primary) hypertension: Secondary | ICD-10-CM

## 2022-10-09 LAB — CBC WITH DIFFERENTIAL/PLATELET
Basophils Absolute: 0.1 10*3/uL (ref 0.0–0.1)
Basophils Relative: 1 % (ref 0.0–3.0)
Eosinophils Absolute: 0.3 10*3/uL (ref 0.0–0.7)
Eosinophils Relative: 5 % (ref 0.0–5.0)
HCT: 35.3 % — ABNORMAL LOW (ref 36.0–46.0)
Hemoglobin: 11.4 g/dL — ABNORMAL LOW (ref 12.0–15.0)
Lymphocytes Relative: 30.7 % (ref 12.0–46.0)
Lymphs Abs: 1.8 10*3/uL (ref 0.7–4.0)
MCHC: 32.1 g/dL (ref 30.0–36.0)
MCV: 78.5 fl (ref 78.0–100.0)
Monocytes Absolute: 0.5 10*3/uL (ref 0.1–1.0)
Monocytes Relative: 9.1 % (ref 3.0–12.0)
Neutro Abs: 3.1 10*3/uL (ref 1.4–7.7)
Neutrophils Relative %: 54.2 % (ref 43.0–77.0)
Platelets: 192 10*3/uL (ref 150.0–400.0)
RBC: 4.5 Mil/uL (ref 3.87–5.11)
RDW: 18.8 % — ABNORMAL HIGH (ref 11.5–15.5)
WBC: 5.7 10*3/uL (ref 4.0–10.5)

## 2022-10-09 LAB — LIPID PANEL
Cholesterol: 107 mg/dL (ref 0–200)
HDL: 34.5 mg/dL — ABNORMAL LOW (ref >39.00)
LDL Cholesterol: 36 mg/dL (ref 0–99)
NonHDL: 72.38
Total CHOL/HDL Ratio: 3
Triglycerides: 181 mg/dL — ABNORMAL HIGH (ref 0.0–149.0)
VLDL: 36.2 mg/dL (ref 0.0–40.0)

## 2022-10-09 LAB — COMPREHENSIVE METABOLIC PANEL
ALT: 11 U/L (ref 0–35)
AST: 18 U/L (ref 0–37)
Albumin: 3.9 g/dL (ref 3.5–5.2)
Alkaline Phosphatase: 47 U/L (ref 39–117)
BUN: 13 mg/dL (ref 6–23)
CO2: 32 mEq/L (ref 19–32)
Calcium: 9.1 mg/dL (ref 8.4–10.5)
Chloride: 103 mEq/L (ref 96–112)
Creatinine, Ser: 0.71 mg/dL (ref 0.40–1.20)
GFR: 82.72 mL/min (ref >60.00)
Glucose, Bld: 117 mg/dL — ABNORMAL HIGH (ref 70–99)
Potassium: 4 mEq/L (ref 3.5–5.1)
Sodium: 140 mEq/L (ref 135–145)
Total Bilirubin: 0.6 mg/dL (ref 0.2–1.2)
Total Protein: 6.8 g/dL (ref 6.0–8.3)

## 2022-10-09 LAB — HEMOGLOBIN A1C: Hgb A1c MFr Bld: 5.6 % (ref 4.6–6.5)

## 2022-10-09 LAB — VITAMIN D 25 HYDROXY (VIT D DEFICIENCY, FRACTURES): VITD: 48.6 ng/mL (ref 30.00–100.00)

## 2022-10-09 LAB — TSH: TSH: 0.5 u[IU]/mL (ref 0.35–5.50)

## 2022-10-11 DIAGNOSIS — L2089 Other atopic dermatitis: Secondary | ICD-10-CM | POA: Diagnosis not present

## 2022-10-16 ENCOUNTER — Encounter: Payer: Self-pay | Admitting: Internal Medicine

## 2022-10-16 ENCOUNTER — Encounter: Payer: Self-pay | Admitting: Family Medicine

## 2022-10-16 ENCOUNTER — Ambulatory Visit (INDEPENDENT_AMBULATORY_CARE_PROVIDER_SITE_OTHER): Payer: PPO | Admitting: Family Medicine

## 2022-10-16 VITALS — BP 104/54 | HR 66 | Temp 97.0°F | Ht 64.0 in | Wt 223.0 lb

## 2022-10-16 DIAGNOSIS — Z0000 Encounter for general adult medical examination without abnormal findings: Secondary | ICD-10-CM | POA: Diagnosis not present

## 2022-10-16 DIAGNOSIS — Z7189 Other specified counseling: Secondary | ICD-10-CM

## 2022-10-16 DIAGNOSIS — R21 Rash and other nonspecific skin eruption: Secondary | ICD-10-CM

## 2022-10-16 DIAGNOSIS — R059 Cough, unspecified: Secondary | ICD-10-CM

## 2022-10-16 DIAGNOSIS — M25559 Pain in unspecified hip: Secondary | ICD-10-CM

## 2022-10-16 DIAGNOSIS — I480 Paroxysmal atrial fibrillation: Secondary | ICD-10-CM

## 2022-10-16 DIAGNOSIS — M810 Age-related osteoporosis without current pathological fracture: Secondary | ICD-10-CM

## 2022-10-16 DIAGNOSIS — E039 Hypothyroidism, unspecified: Secondary | ICD-10-CM

## 2022-10-16 DIAGNOSIS — Z659 Problem related to unspecified psychosocial circumstances: Secondary | ICD-10-CM

## 2022-10-16 DIAGNOSIS — L2089 Other atopic dermatitis: Secondary | ICD-10-CM | POA: Diagnosis not present

## 2022-10-16 MED ORDER — AMOXICILLIN-POT CLAVULANATE 875-125 MG PO TABS: 1.0000 | ORAL_TABLET | Freq: Two times a day (BID) | ORAL | 0 refills | Status: DC

## 2022-10-16 MED ORDER — TRAMADOL HCL 50 MG PO TABS: 50.0000 mg | ORAL_TABLET | Freq: Two times a day (BID) | ORAL | 1 refills | Status: DC | PRN

## 2022-10-16 MED ORDER — HYDROCODONE BIT-HOMATROP MBR 5-1.5 MG/5ML PO SOLN: 5.0000 mL | Freq: Three times a day (TID) | ORAL | 0 refills | Status: DC | PRN

## 2022-10-16 NOTE — Patient Instructions (Addendum)
Please call about a mammogram and check with the GI clinic about a colonoscopy.  Rest and fluids.  Start augmentin if not better in 1-2 days.   Take care.  Glad to see you.
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
## 2022-10-17 ENCOUNTER — Telehealth: Payer: Self-pay | Admitting: *Deleted

## 2022-10-17 DIAGNOSIS — H903 Sensorineural hearing loss, bilateral: Secondary | ICD-10-CM | POA: Diagnosis not present

## 2022-10-17 NOTE — Progress Notes (Signed)
  Care Coordination   Note   10/17/2022 Name: Kara Wright MRN: [id number] DOB: July 12, 1945  Kara Wright is a 77 y.o. year old female who sees Joaquim Nam, MD for primary care. I reached out to [name] by phone today to offer care coordination services.  [name] was given information about Care Coordination services today including:   The Care Coordination services include support from the care team which includes your Nurse Coordinator, Clinical Social Worker, or Pharmacist.  The Care Coordination team is here to help remove barriers to the health concerns and goals most important to you. Care Coordination services are voluntary, and the patient may decline or stop services at any time by request to their care team member.   Care Coordination Consent Status: Patient agreed to services and verbal consent obtained.   Follow up plan:  Telephone appointment with care coordination team member scheduled for:  10/18/2022  Encounter Outcome:  Pt. Scheduled from referral   Burman Nieves, [facility] Care Coordination Care Guide Direct Dial: [phone]

## 2022-10-18 ENCOUNTER — Telehealth: Payer: Self-pay | Admitting: Family Medicine

## 2022-10-18 ENCOUNTER — Ambulatory Visit: Payer: Self-pay

## 2022-10-18 DIAGNOSIS — R21 Rash and other nonspecific skin eruption: Secondary | ICD-10-CM | POA: Insufficient documentation

## 2022-10-18 DIAGNOSIS — Z659 Problem related to unspecified psychosocial circumstances: Secondary | ICD-10-CM | POA: Insufficient documentation

## 2022-10-18 NOTE — Assessment & Plan Note (Signed)
Would continue on fosamax.  Routine med use d/w pt.  DXA prev done 2022.

## 2022-10-18 NOTE — Telephone Encounter (Signed)
Dr. Neila Gear have your staff check with this patient about getting her follow-up colonoscopy scheduled.  It appears that she is due, she is anticoagulated, and her hemoglobin is slightly lower than prior.  I greatly appreciate your help.

## 2022-10-18 NOTE — Patient Instructions (Signed)
Visit Information  Thank you for taking time to visit with me today. Please don't hesitate to contact me if I can be of assistance to you.   Following are the goals we discussed today:   Goals Addressed   None      If you are experiencing a Mental Health or Behavioral Health Crisis or need someone to talk to, please call 911  Patient verbalizes understanding of instructions and care plan provided today and agrees to view in MyChart. Active MyChart status and patient understanding of how to access instructions and care plan via MyChart confirmed with patient.     No further follow up required:      Lysle Morales, BSW Social Worker [facility] Care Management  [phone]

## 2022-10-18 NOTE — Assessment & Plan Note (Signed)
She is going to follow-up with dermatology today.  I will await that consult note.

## 2022-10-18 NOTE — Assessment & Plan Note (Signed)
No spontaneous bleeding.  Still anticoagulated.  No CP.  Some DOE that is improving in the meantime.  Would continue with as needed diltiazem, scheduled propranolol, and Xarelto.

## 2022-10-18 NOTE — Assessment & Plan Note (Signed)
Continue tramadol as needed as needed.

## 2022-10-18 NOTE — Assessment & Plan Note (Addendum)
Lungs are clear after coughing episode.  She can start Augmentin if she does not clearly improve in the very near future.  Rest and fluids in the meantime.  Okay for outpatient follow-up.  Can use Hycodan if needed.
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
## 2022-10-18 NOTE — Assessment & Plan Note (Signed)
TSH wnl.  Compliant.  Labs d/w pt.  No neck mass.  No dysphagia.  Continue levothyroxine as is. 

## 2022-10-19 ENCOUNTER — Telehealth: Payer: Self-pay | Admitting: Internal Medicine

## 2022-10-19 ENCOUNTER — Other Ambulatory Visit: Payer: Self-pay | Admitting: Family Medicine

## 2022-10-19 DIAGNOSIS — Z1231 Encounter for screening mammogram for malignant neoplasm of breast: Secondary | ICD-10-CM

## 2022-10-19 NOTE — Telephone Encounter (Signed)
Hi Dr. Marina Goodell,  Patient called to schedule her recall colonoscopy due to her age please advise on scheduling.   Thank you

## 2022-10-30 NOTE — Telephone Encounter (Signed)
Noted. Thanks.

## 2022-10-30 NOTE — Telephone Encounter (Signed)
Bonita Quin, Please set this patient up for routine office visit with myself or advanced practitioner regarding possible surveillance colonoscopy. She is on Xarelto. Thanks, Dr. Demetrius Charity

## 2022-10-30 NOTE — Telephone Encounter (Signed)
Pt scheduled to see Dr. Marina Goodell 6/18@4pm . Pt aware of appt.

## 2022-10-30 NOTE — Telephone Encounter (Signed)
My nurse is addressing this issue.  Thanks

## 2022-11-02 ENCOUNTER — Encounter: Payer: Self-pay | Admitting: Family Medicine

## 2022-11-02 ENCOUNTER — Ambulatory Visit (INDEPENDENT_AMBULATORY_CARE_PROVIDER_SITE_OTHER): Payer: PPO | Admitting: Family Medicine

## 2022-11-02 VITALS — BP 144/70 | HR 88 | Temp 98.0°F | Ht 64.0 in | Wt 223.4 lb

## 2022-11-02 DIAGNOSIS — N390 Urinary tract infection, site not specified: Secondary | ICD-10-CM

## 2022-11-02 DIAGNOSIS — R350 Frequency of micturition: Secondary | ICD-10-CM | POA: Diagnosis not present

## 2022-11-02 LAB — POC URINALSYSI DIPSTICK (AUTOMATED)
Bilirubin, UA: NEGATIVE
Glucose, UA: NEGATIVE
Ketones, UA: NEGATIVE
Nitrite, UA: NEGATIVE
Protein, UA: POSITIVE — AB
Spec Grav, UA: <=1.005 — AB (ref 1.010–1.025)
Urobilinogen, UA: 0.2 E.U./dL
pH, UA: 6 (ref 5.0–8.0)

## 2022-11-02 MED ORDER — NITROFURANTOIN MONOHYD MACRO 100 MG PO CAPS
100.0000 mg | ORAL_CAPSULE | Freq: Two times a day (BID) | ORAL | 0 refills | Status: DC
Start: 2022-11-02 — End: 2022-11-28

## 2022-11-02 NOTE — Progress Notes (Signed)
Khamila Bassinger T. Mercedees Convery, MD, CAQ Sports Medicine [facility] at [facility] [address] [employer] [place], [address]  Phone: [phone]  FAX: [phone]  Kara Wright - 77 y.o. female  MRN [id number]  Date of Birth: 22-Oct-1945  Date: 11/02/2022  PCP: Joaquim Nam, MD  Referral: Joaquim Nam, MD  Chief Complaint  Patient presents with   Urinary Frequency   Subjective:   Kara Wright is a 77 y.o. very pleasant female patient with Body mass index is 38.34 kg/m. who presents with the following:  Feels like needs to go to the bathroom all the time.  She does have a sensation of pressure, mild pain in the hypogastric region.  There is some suprapubic pain. Urgency with mild pain Pressure  No nausea, vomiting, diarrhea, fever.  She is otherwise feeling reasonably okay.  Review of Systems is noted in the HPI, as appropriate  Objective:   BP (!) 144/70 (BP Location: Left Arm, Patient Position: Sitting, Cuff Size: Large)   Pulse 88   Temp 98 F (36.7 C) (Temporal)   Ht 5\' 4"  (1.626 m)   Wt 223 lb 6 oz (101.3 kg)   SpO2 93%   BMI 38.34 kg/m   GEN: WDWN HEENT: Atraumatc, normocephalic. CV: RRR, No M/G/R PULM: CTA B, No wheezes, crackles, or rhonchi ABD: S, NT, ND, +BS, no rebound. No CVAT. No suprapubic tenderness.   Laboratory and Imaging Data: Results for orders placed or performed in visit on 11/02/22  POCT Urinalysis Dipstick (Automated)  Result Value Ref Range   Color, UA Straw    Clarity, UA Clear    Glucose, UA Negative Negative   Bilirubin, UA Negative    Ketones, UA Negative    Spec Grav, UA <=1.005 (A) 1.010 - 1.025   Blood, UA Large    pH, UA 6.0 5.0 - 8.0   Protein, UA Positive (A) Negative   Urobilinogen, UA 0.2 0.2 or 1.0 E.U./dL   Nitrite, UA Negative    Leukocytes, UA Small (1+) (A) Negative     Assessment and Plan:     ICD-10-CM   1. Acute lower UTI  N39.0     2. Urinary frequency  R35.0 POCT Urinalysis  Dipstick (Automated)    Urine Culture     Probable UTI based on symptoms and UA.  Treat as such.  Medication Management during today's office visit: Meds ordered this encounter  Medications   nitrofurantoin, macrocrystal-monohydrate, (MACROBID) 100 MG capsule    Sig: Take 1 capsule (100 mg total) by mouth 2 (two) times daily.    Dispense:  14 capsule    Refill:  0   Medications Discontinued During This Encounter  Medication Reason   amoxicillin-clavulanate (AUGMENTIN) 875-125 MG tablet Completed Course   HYDROcodone bit-homatropine (HYCODAN) 5-1.5 MG/5ML syrup Completed Course    Orders placed today for conditions managed today: Orders Placed This Encounter  Procedures   Urine Culture   POCT Urinalysis Dipstick (Automated)    Disposition: No follow-ups on file.  [employer] One speech-to-text software was used for transcription in this dictation.  Possible transcriptional errors can occur using Animal nutritionist.   Signed,  Elpidio Galea. Dayvian Blixt, MD   Outpatient Encounter Medications as of 11/02/2022  Medication Sig   albuterol (VENTOLIN HFA) 108 (90 Base) MCG/ACT inhaler Inhale 2 puffs into the lungs every 6 (six) hours as needed for wheezing or shortness of breath.   alendronate (FOSAMAX) 70 MG tablet TAKE ONE TABLET  BY MOUTH EVERY WEEK Take with a full glass of water on an empty stomach.   clobetasol ointment (TEMOVATE) 0.05 % Apply topically 2 (two) times daily.   D3 SUPER STRENGTH 50 MCG (2000 UT) CAPS TAKE TWO CAPSULES BY MOUTH DAILY   diltiazem (CARDIZEM) 30 MG tablet TAKE ONE TABLET BY MOUTH THREE TIMES DAILY AS NEEDED   folic acid (FOLVITE) 1 MG tablet Take 1 mg by mouth daily.   levothyroxine (SYNTHROID) 150 MCG tablet TAKE ONE TABLET BY MOUTH ON MONDAY THROUGH SATURDAY EACH WEEK and TAKE TWO TABLETS ONCE WEEKLY ON SUNDAY   loperamide (IMODIUM A-D) 2 MG tablet Take 2 mg by mouth 2 (two) times daily as needed for diarrhea or loose stools.   loratadine (CLARITIN) 10  MG tablet Take 1 tablet (10 mg total) by mouth daily.   methotrexate 2.5 MG tablet Take 10 mg by mouth once a week.   nitrofurantoin, macrocrystal-monohydrate, (MACROBID) 100 MG capsule Take 1 capsule (100 mg total) by mouth 2 (two) times daily.   omeprazole (PRILOSEC) 20 MG capsule TAKE ONE CAPSULE BY MOUTH DAILY   promethazine (PHENERGAN) 25 MG tablet TAKE ONE TABLET BY MOUTH EVERY 6 HOURS AS NEEDED FOR NAUSEA   propranolol ER (INDERAL LA) 120 MG 24 hr capsule TAKE ONE TABLET BY MOUTH AT BEDTIME   rosuvastatin (CRESTOR) 5 MG tablet TAKE ONE TABLET BY MOUTH AT BEDTIME   traMADol (ULTRAM) 50 MG tablet Take 1-2 tablets (50-100 mg total) by mouth every 12 (twelve) hours as needed. CAUTION SEDATION   XARELTO 20 MG TABS tablet TAKE ONE TABLET BY MOUTH IN THE EVENING WITH DINNER   [DISCONTINUED] amoxicillin-clavulanate (AUGMENTIN) 875-125 MG tablet Take 1 tablet by mouth 2 (two) times daily.   [DISCONTINUED] HYDROcodone bit-homatropine (HYCODAN) 5-1.5 MG/5ML syrup Take 5 mLs by mouth every 8 (eight) hours as needed for cough.   No facility-administered encounter medications on file as of 11/02/2022.

## 2022-11-03 LAB — URINE CULTURE
MICRO NUMBER:: 14965796
SPECIMEN QUALITY:: ADEQUATE

## 2022-11-14 ENCOUNTER — Ambulatory Visit: Admission: RE | Admit: 2022-11-14 | Payer: PPO | Source: Ambulatory Visit

## 2022-11-21 ENCOUNTER — Other Ambulatory Visit: Payer: Self-pay

## 2022-11-21 MED ORDER — OMEPRAZOLE 20 MG PO CPDR: 20.0000 mg | DELAYED_RELEASE_CAPSULE | Freq: Every day | ORAL | 2 refills | Status: DC

## 2022-11-21 MED ORDER — D3 SUPER STRENGTH 50 MCG (2000 UT) PO CAPS: 2.0000 | ORAL_CAPSULE | Freq: Every day | ORAL | 2 refills | Status: DC

## 2022-11-21 MED ORDER — RIVAROXABAN 20 MG PO TABS: ORAL_TABLET | ORAL | 1 refills | Status: DC

## 2022-11-21 MED ORDER — PROPRANOLOL HCL ER 120 MG PO CP24: 120.0000 mg | ORAL_CAPSULE | Freq: Every day | ORAL | 2 refills | Status: DC

## 2022-11-27 ENCOUNTER — Telehealth: Payer: Self-pay | Admitting: Family Medicine

## 2022-11-27 NOTE — Telephone Encounter (Signed)
After speaking with Dr. Patsy Lager patient was advised that she would need to be seen and re-evaluated.  Patient stated that she started yesterday with urine urgency. Patient scheduled for an office visit tomorrow 11/28/22 with Dr. Ermalene Searing. Patient was advised to make sure that she drinks plenty of water. Patient was given ER precautions and she verbalized understanding.

## 2022-11-27 NOTE — Telephone Encounter (Signed)
Patient was prescribed medication nitrofurantoin, macrocrystal-monohydrate, (MACROBID) 100 MG capsule  for a uti on 5.16.2024. She called in today stating that she finished the course of medications however her symptoms have came back.She would like to know if another antibiotic can be sent in for her or if she needs to come in and be reevaluated.

## 2022-11-28 ENCOUNTER — Ambulatory Visit (INDEPENDENT_AMBULATORY_CARE_PROVIDER_SITE_OTHER): Payer: PPO | Admitting: Family Medicine

## 2022-11-28 ENCOUNTER — Encounter: Payer: Self-pay | Admitting: Family Medicine

## 2022-11-28 VITALS — BP 100/60 | HR 72 | Temp 98.2°F | Ht 64.0 in | Wt 224.5 lb

## 2022-11-28 DIAGNOSIS — R350 Frequency of micturition: Secondary | ICD-10-CM

## 2022-11-28 LAB — POCT UA - MICROSCOPIC ONLY

## 2022-11-28 LAB — POC URINALSYSI DIPSTICK (AUTOMATED)
Bilirubin, UA: NEGATIVE
Glucose, UA: NEGATIVE
Ketones, UA: NEGATIVE
Leukocytes, UA: NEGATIVE
Nitrite, UA: NEGATIVE
Protein, UA: NEGATIVE
Spec Grav, UA: 1.01 (ref 1.010–1.025)
Urobilinogen, UA: 0.2 E.U./dL
pH, UA: 6 (ref 5.0–8.0)

## 2022-11-28 NOTE — Patient Instructions (Addendum)
We will send urine for culture.  Push water, avoid bladder irritants... if culture returns negative and continued symptoms, we will consider referral to urology.

## 2022-11-28 NOTE — Progress Notes (Signed)
Patient ID: Kara Wright, female    DOB: 10/27/1945, 77 y.o.   MRN: [id number]  This visit was conducted in person.  BP 100/60 (BP Location: Left Arm, Patient Position: Sitting, Cuff Size: Large)   Pulse 72   Temp 98.2 F (36.8 C) (Temporal)   Ht 5\' 4"  (1.626 m)   Wt 224 lb 8 oz (101.8 kg)   SpO2 94%   BMI 38.54 kg/m    CC:  Chief Complaint  Patient presents with   Urinary Frequency    Subjective:   HPI: Kara Wright is a 77 y.o. female presenting on 11/28/2022 for Urinary Frequency  Urinary Frequency  This is a new problem. The current episode started in the past 7 days. The problem has been gradually worsening. The pain is mild. There has been no fever. She is Not sexually active. Associated symptoms include frequency and urgency. Pertinent negatives include no chills, flank pain, hematuria, nausea or vomiting. Associated symptoms comments:  Abd discomfort.. feel like not emptying completely.  Has to strain to get urine out but nothing is coming.  No true dysuria.. She has tried increased fluids for the symptoms. There is no history of catheterization or a urological procedure.   Recent office visit with Dr. Patsy Lager Nov 02, 2022 for urinary tract infection treated with nitrofurantoin.  Urine culture returned.  Symptoms returned until yesterday.  Relevant past medical, surgical, family and social history reviewed and updated as indicated. Interim medical history since our last visit reviewed. Allergies and medications reviewed and updated. Outpatient Medications Prior to Visit  Medication Sig Dispense Refill   albuterol (VENTOLIN HFA) 108 (90 Base) MCG/ACT inhaler Inhale 2 puffs into the lungs every 6 (six) hours as needed for wheezing or shortness of breath. 8 g 3   alendronate (FOSAMAX) 70 MG tablet TAKE ONE TABLET BY MOUTH EVERY WEEK Take with a full glass of water on an empty stomach. 13 tablet 2   Cholecalciferol (D3 SUPER STRENGTH) 50 MCG (2000 UT) CAPS Take 2  capsules (4,000 Units total) by mouth daily. 180 capsule 2   clobetasol ointment (TEMOVATE) 0.05 % Apply topically 2 (two) times daily.     diltiazem (CARDIZEM) 30 MG tablet TAKE ONE TABLET BY MOUTH THREE TIMES DAILY AS NEEDED 90 tablet 1   Dupilumab (DUPIXENT) 300 MG/2ML SOPN Inject 300 mg into the skin every 14 (fourteen) days.     folic acid (FOLVITE) 1 MG tablet Take 1 mg by mouth daily.     levothyroxine (SYNTHROID) 150 MCG tablet TAKE ONE TABLET BY MOUTH ON MONDAY THROUGH SATURDAY EACH WEEK and TAKE TWO TABLETS ONCE WEEKLY ON SUNDAY 110 tablet 3   loperamide (IMODIUM A-D) 2 MG tablet Take 2 mg by mouth 2 (two) times daily as needed for diarrhea or loose stools.     loratadine (CLARITIN) 10 MG tablet Take 1 tablet (10 mg total) by mouth daily.     methotrexate 2.5 MG tablet Take 10 mg by mouth once a week.     omeprazole (PRILOSEC) 20 MG capsule Take 1 capsule (20 mg total) by mouth daily. 90 capsule 2   promethazine (PHENERGAN) 25 MG tablet TAKE ONE TABLET BY MOUTH EVERY 6 HOURS AS NEEDED FOR NAUSEA 30 tablet 3   propranolol ER (INDERAL LA) 120 MG 24 hr capsule Take 1 capsule (120 mg total) by mouth at bedtime. 90 capsule 2   rivaroxaban (XARELTO) 20 MG TABS tablet TAKE ONE TABLET BY MOUTH IN THE EVENING  WITH DINNER 90 tablet 1   rosuvastatin (CRESTOR) 5 MG tablet TAKE ONE TABLET BY MOUTH AT BEDTIME 90 tablet 3   traMADol (ULTRAM) 50 MG tablet Take 1-2 tablets (50-100 mg total) by mouth every 12 (twelve) hours as needed. CAUTION SEDATION 50 tablet 1   nitrofurantoin, macrocrystal-monohydrate, (MACROBID) 100 MG capsule Take 1 capsule (100 mg total) by mouth 2 (two) times daily. 14 capsule 0   No facility-administered medications prior to visit.     Per HPI unless specifically indicated in ROS section below Review of Systems  Constitutional:  Negative for chills.  Gastrointestinal:  Negative for nausea and vomiting.  Genitourinary:  Positive for frequency and urgency. Negative for flank  pain and hematuria.   Objective:  BP 100/60 (BP Location: Left Arm, Patient Position: Sitting, Cuff Size: Large)   Pulse 72   Temp 98.2 F (36.8 C) (Temporal)   Ht 5\' 4"  (1.626 m)   Wt 224 lb 8 oz (101.8 kg)   SpO2 94%   BMI 38.54 kg/m   Wt Readings from Last 3 Encounters:  11/28/22 224 lb 8 oz (101.8 kg)  11/02/22 223 lb 6 oz (101.3 kg)  10/16/22 223 lb (101.2 kg)      Physical Exam Constitutional:      General: She is not in acute distress.    Appearance: Normal appearance. She is well-developed. She is not ill-appearing or toxic-appearing.  HENT:     Head: Normocephalic.     Right Ear: Hearing, tympanic membrane, ear canal and external ear normal. Tympanic membrane is not erythematous, retracted or bulging.     Left Ear: Hearing, tympanic membrane, ear canal and external ear normal. Tympanic membrane is not erythematous, retracted or bulging.     Nose: No mucosal edema or rhinorrhea.     Right Sinus: No maxillary sinus tenderness or frontal sinus tenderness.     Left Sinus: No maxillary sinus tenderness or frontal sinus tenderness.     Mouth/Throat:     Pharynx: Uvula midline.  Eyes:     General: Lids are normal. Lids are everted, no foreign bodies appreciated.     Conjunctiva/sclera: Conjunctivae normal.     Pupils: Pupils are equal, round, and reactive to light.  Neck:     Thyroid: No thyroid mass or thyromegaly.     Vascular: No carotid bruit.     Trachea: Trachea normal.  Cardiovascular:     Rate and Rhythm: Normal rate and regular rhythm.     Pulses: Normal pulses.     Heart sounds: Normal heart sounds, S1 normal and S2 normal. No murmur heard.    No friction rub. No gallop.  Pulmonary:     Effort: Pulmonary effort is normal. No tachypnea or respiratory distress.     Breath sounds: Normal breath sounds. No decreased breath sounds, wheezing, rhonchi or rales.  Abdominal:     General: Bowel sounds are normal.     Palpations: Abdomen is soft.     Tenderness:  There is no abdominal tenderness.  Musculoskeletal:     Cervical back: Normal range of motion and neck supple.  Skin:    General: Skin is warm and dry.     Findings: No rash.  Neurological:     Mental Status: She is alert.  Psychiatric:        Mood and Affect: Mood is not anxious or depressed.        Speech: Speech normal.        Behavior: Behavior  normal. Behavior is cooperative.        Thought Content: Thought content normal.        Judgment: Judgment normal.       Results for orders placed or performed in visit on 11/28/22  Urine Culture   Specimen: Urine  Result Value Ref Range   MICRO NUMBER: [id number]    SPECIMEN QUALITY: Adequate    Sample Source URINE    STATUS: FINAL    ISOLATE 1: Escherichia coli (A)       Susceptibility   Escherichia coli - URINE CULTURE, REFLEX    AMOX/CLAVULANIC 4 Sensitive     AMPICILLIN >=32 Resistant     AMPICILLIN/SULBACTAM 4 Sensitive     CEFAZOLIN* <=4 Not Reportable      * For infections other than uncomplicated UTI caused by E. coli, K. pneumoniae or P. mirabilis: Cefazolin is resistant if MIC > or = 8 mcg/mL. (Distinguishing susceptible versus intermediate for isolates with MIC < or = 4 mcg/mL requires additional testing.) For uncomplicated UTI caused by E. coli, K. pneumoniae or P. mirabilis: Cefazolin is susceptible if MIC <32 mcg/mL and predicts susceptible to the oral agents cefaclor, cefdinir, cefpodoxime, cefprozil, cefuroxime, cephalexin and loracarbef.     CEFTAZIDIME <=1 Sensitive     CEFEPIME <=1 Sensitive     CEFTRIAXONE <=1 Sensitive     CIPROFLOXACIN <=0.25 Sensitive     LEVOFLOXACIN <=0.12 Sensitive     GENTAMICIN >=16 Resistant     IMIPENEM <=0.25 Sensitive     NITROFURANTOIN <=16 Sensitive     PIP/TAZO <=4 Sensitive     TOBRAMYCIN 8 Intermediate     TRIMETH/SULFA* >=320 Resistant      * For infections other than uncomplicated UTI caused by E. coli, K. pneumoniae or P. mirabilis: Cefazolin is resistant if  MIC > or = 8 mcg/mL. (Distinguishing susceptible versus intermediate for isolates with MIC < or = 4 mcg/mL requires additional testing.) For uncomplicated UTI caused by E. coli, K. pneumoniae or P. mirabilis: Cefazolin is susceptible if MIC <32 mcg/mL and predicts susceptible to the oral agents cefaclor, cefdinir, cefpodoxime, cefprozil, cefuroxime, cephalexin and loracarbef. Legend: S = Susceptible  I = Intermediate R = Resistant  NS = Not susceptible * = Not tested  NR = Not reported **NN = See antimicrobic comments   POCT Urinalysis Dipstick (Automated)  Result Value Ref Range   Color, UA Yellow    Clarity, UA Clear    Glucose, UA Negative Negative   Bilirubin, UA Negative    Ketones, UA Negative    Spec Grav, UA 1.010 1.010 - 1.025   Blood, UA Small (1+)    pH, UA 6.0 5.0 - 8.0   Protein, UA Negative Negative   Urobilinogen, UA 0.2 0.2 or 1.0 E.U./dL   Nitrite, UA Negative    Leukocytes, UA Negative Negative  POCT UA - Microscopic Only  Result Value Ref Range   WBC, Ur, HPF, POC 5-10 0 - 5   RBC, Urine, Miroscopic 5-10 0 - 2   Bacteria, U Microscopic     Mucus, UA     Epithelial cells, urine per micros moderate    Crystals, Ur, HPF, POC     Casts, Ur, LPF, POC     Yeast, UA      Assessment and Plan  Urinary frequency Assessment & Plan: Acute, not typical of urinary tract infection.  Possible bladder irritation versus structural issue causing problems emptying completely.  May need referral to urology for  evaluation of urethral stricture.   We will send urine for culture.  Push water, avoid bladder irritants... if culture returns negative and continued symptoms, we will consider referral to urology.  Orders: -     POCT Urinalysis Dipstick (Automated) -     POCT UA - Microscopic Only -     Urine Culture    No follow-ups on file.   Kerby Nora, MD

## 2022-11-28 NOTE — Telephone Encounter (Signed)
Agree. Thanks

## 2022-11-30 ENCOUNTER — Other Ambulatory Visit: Payer: Self-pay | Admitting: Family Medicine

## 2022-11-30 LAB — URINE CULTURE
MICRO NUMBER:: 15067910
SPECIMEN QUALITY:: ADEQUATE

## 2022-11-30 MED ORDER — CEPHALEXIN 500 MG PO CAPS
500.0000 mg | ORAL_CAPSULE | Freq: Three times a day (TID) | ORAL | 0 refills | Status: DC
Start: 2022-11-30 — End: 2022-12-28

## 2022-11-30 NOTE — Telephone Encounter (Signed)
Refill request for tramadol 50 mg tablet   LOV - 11/28/22 with Dr. Ermalene Searing Next OV - not scheduled Last refill - 10/16/22 #50/1

## 2022-12-01 DIAGNOSIS — R350 Frequency of micturition: Secondary | ICD-10-CM | POA: Insufficient documentation

## 2022-12-01 NOTE — Assessment & Plan Note (Signed)
Acute, not typical of urinary tract infection.  Possible bladder irritation versus structural issue causing problems emptying completely.  May need referral to urology for evaluation of urethral stricture.   We will send urine for culture.  Push water, avoid bladder irritants... if culture returns negative and continued symptoms, we will consider referral to urology.

## 2022-12-01 NOTE — Telephone Encounter (Signed)
Sent. Thanks.   

## 2022-12-05 ENCOUNTER — Ambulatory Visit: Payer: PPO | Admitting: Internal Medicine

## 2022-12-06 ENCOUNTER — Ambulatory Visit
Admission: RE | Admit: 2022-12-06 | Discharge: 2022-12-06 | Disposition: A | Discharge status: Home / Self Care | Payer: PPO | Source: Ambulatory Visit | Attending: Family Medicine | Admitting: Family Medicine

## 2022-12-06 DIAGNOSIS — Z1231 Encounter for screening mammogram for malignant neoplasm of breast: Secondary | ICD-10-CM | POA: Diagnosis not present

## 2022-12-07 ENCOUNTER — Ambulatory Visit: Payer: PPO | Admitting: Internal Medicine

## 2022-12-25 ENCOUNTER — Telehealth: Payer: Self-pay

## 2022-12-25 NOTE — Telephone Encounter (Signed)
Noted. Thanks.

## 2022-12-25 NOTE — Telephone Encounter (Signed)
I spoke with pt; pt said her BP is better; 12/24/22 BP 170/80 and thinks today BP is OK by way pt feels; pt has not taken BP today.No CP mor difficulty breathing. Per pt she is in no distress at this time.pt also has frequency and urgency of urine. No pain or burning upon urination. Pt said has frequent UTIs.offered pt appt with another provider today but pt only wants to see Dr Para March. Pt scheduled appt with Dr Para March on 12/28/22 at 3 PM. Sending note to Dr Para March and Para March pool.UC & ED precautions given and pt voiced understanding.

## 2022-12-28 ENCOUNTER — Encounter: Payer: Self-pay | Admitting: Family Medicine

## 2022-12-28 ENCOUNTER — Ambulatory Visit (INDEPENDENT_AMBULATORY_CARE_PROVIDER_SITE_OTHER): Payer: PPO | Admitting: Family Medicine

## 2022-12-28 VITALS — BP 126/80 | HR 81 | Temp 97.6°F | Ht 64.0 in | Wt 222.0 lb

## 2022-12-28 DIAGNOSIS — N390 Urinary tract infection, site not specified: Secondary | ICD-10-CM

## 2022-12-28 DIAGNOSIS — R350 Frequency of micturition: Secondary | ICD-10-CM | POA: Diagnosis not present

## 2022-12-28 LAB — POC URINALSYSI DIPSTICK (AUTOMATED)
Bilirubin, UA: NEGATIVE
Blood, UA: 10
Glucose, UA: NEGATIVE
Ketones, UA: NEGATIVE
Leukocytes, UA: NEGATIVE
Nitrite, UA: NEGATIVE
Protein, UA: NEGATIVE
Spec Grav, UA: 1.015 (ref 1.010–1.025)
Urobilinogen, UA: 0.2 E.U./dL
pH, UA: 6 (ref 5.0–8.0)

## 2022-12-28 MED ORDER — CEPHALEXIN 500 MG PO CAPS: 500.0000 mg | ORAL_CAPSULE | Freq: Three times a day (TID) | ORAL | 0 refills | Status: DC

## 2022-12-28 NOTE — Progress Notes (Signed)
dysuria: off and on for months.  Urgency, dysuria.   duration of symptoms: this episode has been going on for a few weeks.  abdominal pain: no fevers:no back pain: some lower back pain in the AMs. No pain now.   vomiting: no Labs abx was about 1 month ago, with ucx pos E coli.    Prev with BP elevation after eating pork.  Normalized today.    Home stressors d/w pt.  Her daughter (76 y/o) and 2 grandsons (71 y/o and 70 y/o) are living with patient.  She is safe at home.   Meds, vitals, and allergies reviewed.  Per HPI unless specifically indicated in ROS section   GEN: nad, alert and oriented HEENT: ncat NECK: supple CV: rrr.  PULM: ctab, no inc wob ABD: soft, +bs, suprapubic area not tender EXT: no edema SKIN: no acute rash BACK: no CVA pain

## 2022-12-28 NOTE — Patient Instructions (Signed)
Drink plenty of water and start the antibiotics today.  We'll contact you with your lab report.  Take care.   You should get a call about seeing urology.

## 2022-12-30 LAB — URINE CULTURE
MICRO NUMBER:: 15188154
SPECIMEN QUALITY:: ADEQUATE

## 2022-12-30 NOTE — Assessment & Plan Note (Signed)
Presumed UTI.  Start Keflex.  See notes on urine culture.  Refer to urology given recurrent symptoms.  She agrees.

## 2023-01-16 ENCOUNTER — Other Ambulatory Visit: Payer: Self-pay | Admitting: Family Medicine

## 2023-01-17 NOTE — Telephone Encounter (Signed)
Refill request for  tramadol 50 mg tablet   LOV - 12/28/22 Next OV - not scheduled Last refill - 12/01/22 #50/1

## 2023-01-18 NOTE — Telephone Encounter (Signed)
Sent. Thanks.   

## 2023-01-22 ENCOUNTER — Ambulatory Visit: Payer: Self-pay | Admitting: Urology

## 2023-01-26 DIAGNOSIS — L2089 Other atopic dermatitis: Secondary | ICD-10-CM | POA: Diagnosis not present

## 2023-02-02 ENCOUNTER — Ambulatory Visit
Admission: RE | Admit: 2023-02-02 | Discharge: 2023-02-02 | Disposition: A | Discharge status: Home / Self Care | Payer: PPO | Source: Ambulatory Visit | Attending: Pulmonary Disease | Admitting: Pulmonary Disease

## 2023-02-02 DIAGNOSIS — R911 Solitary pulmonary nodule: Secondary | ICD-10-CM | POA: Insufficient documentation

## 2023-02-02 DIAGNOSIS — K8689 Other specified diseases of pancreas: Secondary | ICD-10-CM | POA: Diagnosis not present

## 2023-02-02 DIAGNOSIS — R918 Other nonspecific abnormal finding of lung field: Secondary | ICD-10-CM | POA: Diagnosis not present

## 2023-02-05 ENCOUNTER — Telehealth: Payer: Self-pay | Admitting: Pulmonary Disease

## 2023-02-05 NOTE — Telephone Encounter (Signed)
Called and spoke to pt. She states she would not be able to do video visit during her working hours. Advised pt we can wait until the results come through and then decide where we can work pt in. She verbalized understanding and is awaiting our call back regarding her appt.

## 2023-02-05 NOTE — Telephone Encounter (Signed)
Pt had her Ct Scan done on Fri 08/16 but was not aware of her follow up appt fo 08/23. Pt works tue to [employer] from [employer].

## 2023-02-09 ENCOUNTER — Ambulatory Visit: Payer: PPO | Admitting: Acute Care

## 2023-02-13 NOTE — Telephone Encounter (Signed)
Left message for pt to call. Need to offer video visit with Dr Tonia Brooms for 8/28 afternoon.

## 2023-02-13 NOTE — Telephone Encounter (Signed)
Spoke with pt and scheduled Mychart video visit with Dr Tonia Brooms 02/14/23 2:00. Kandice Robinsons, NP did not have availability on her schedule)

## 2023-02-14 ENCOUNTER — Telehealth (INDEPENDENT_AMBULATORY_CARE_PROVIDER_SITE_OTHER): Payer: PPO | Admitting: Pulmonary Disease

## 2023-02-14 VITALS — Wt 222.0 lb

## 2023-02-14 DIAGNOSIS — R918 Other nonspecific abnormal finding of lung field: Secondary | ICD-10-CM

## 2023-02-14 DIAGNOSIS — Z87891 Personal history of nicotine dependence: Secondary | ICD-10-CM | POA: Diagnosis not present

## 2023-02-14 NOTE — Progress Notes (Signed)
Virtual Visit via Video Note  I connected with Kara Wright on 02/14/23 at  2:00 PM EDT by a video enabled telemedicine application and verified that I am speaking with the correct person using two identifiers.  Location: Patient: Office Provider: work    I discussed the limitations of evaluation and management by telemedicine and the availability of in person appointments. The patient expressed understanding and agreed to proceed.  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 Patient recently had follow up CT imaging that revealed stable nodule in the LUL and new nodule in the RLL.  In addition the patient has a past 8 to 75-month history of dyspnea on exertion.  She states that she gets "short winded" when she is climbs a hill or climb stairs.  When asked about her sleep her husband pipes up that she does snore at times.  She is not sure if she thinks her sleep is restful or not.  Patient does not wheeze.  She occasionally has random left-sided sharp pains in the chest.  This is been going on for some time.  She follows with cardiology regarding her atrial fibrillation.  Review of her CT imaging today in the office also reveals enlarged pulmonary arteries.  This was discussed with the patient.   OV 12/18/2018: Patient seen here today for follow-up regarding multiple pulmonary nodules.  Patient had CT imaging of the chest completed on 12/13/2018 for follow-up regarding her nodules.  There is a new small 5 mm nodule in the right pulmonary apex a new 4 mm nodule in the anterior left upper lobe prior nodules either resolved or stable. Today, she states that she has breathing better.  She does have some dyspnea on exertion.  She was unable to get her sleep appointment scheduled.  We will try again for this.  She was very anxious about having the CT scan for the follow-up of the nodules.  She is  relieved to hear that they are smaller.  She does understand there is a few new ones that need follow-up.  Otherwise doing well.  She had PFTs completed prior to today's office which which we reviewed.  It does reveal a reduced FEV1 and FVC with a reserved ratio and a reduced ERV of 10%.  DLCO of 74%.  This is likely related to her weight.  BMI of 40.  We discussed this today in the office as well.  She is trying to watch her weight and her dietary intake.  She does understand importance of losing weight.   OV 07/22/2020: Patient here today for follow-up regarding recent CT scan of the chest for follow-up of lung nodules.  Patient had a CT scan of the chest on 07/12/2020 which revealed persistent waxing and waning bilateral lung nodules.  The left lower lobe lung nodule 1 cm in size seems to be the largest left the other upper lobe nodules that was seen previously are no longer present have subsided she also has some small adenopathy within the chest.  She does have enlarged pulmonary arteries consistent with possible underlying PAH.  From respiratory standpoint she is stable.  She does not have cough sputum production night sweats weight loss fevers.     OV 06/22/2022: Here today for follow-up.  She had a CT scan in August which showed stability of her pulmonary nodules.  She has no other significant symptoms at this time.  She fell this morning and out the back door when she was trying to move a dog water dish.  She states she is doing okay from that.  She has been working on her weight.  We talked about various weight loss options today in the office  OV 02/14/2023: Patient seen today.  Here today for CT follow-up.  Patient had follow-up CT imaging that was complete after 1 year.  She has 4 new nodules.  The largest now in the right upper lobe posterior measuring 9 mm subpleural in nature.  We have had multiple pulmonary nodules that we have followed I have waxed and waned throughout this patient's journey.    Observations/Objective:  Patient in no acute distress resting comfortably at work able to discuss her situation.  CT imaging: August 2024 CT imaging with multiple pulmonary nodules, she has 4 new nodules in comparison to previous imaging. The patient's images have been independently reviewed by me.    Assessment and Plan:  Multiple pulmonary nodules History of OSA on CPAP, former smoker, morbid obesity  Plan: Repeat noncontrasted CT chest in 3-4 months. Return to clinic by the end of the year in December 2024 after CT chest complete.   Follow Up Instructions:    I discussed the assessment and treatment plan with the patient. The patient was provided an opportunity to ask questions and all were answered. The patient agreed with the plan and demonstrated an understanding of the instructions.   The patient was advised to call back or seek an in-person evaluation if the symptoms worsen or if the condition fails to improve as anticipated.  I provided 20 minutes of non-face-to-face time during this encounter.   Josephine Igo, DO

## 2023-02-16 ENCOUNTER — Encounter: Payer: Self-pay | Admitting: Urology

## 2023-02-16 ENCOUNTER — Ambulatory Visit: Payer: PPO | Admitting: Urology

## 2023-02-16 VITALS — BP 159/86 | HR 61 | Ht 64.5 in | Wt 223.0 lb

## 2023-02-16 DIAGNOSIS — N3941 Urge incontinence: Secondary | ICD-10-CM | POA: Diagnosis not present

## 2023-02-16 DIAGNOSIS — N390 Urinary tract infection, site not specified: Secondary | ICD-10-CM | POA: Diagnosis not present

## 2023-02-16 DIAGNOSIS — Z87440 Personal history of urinary (tract) infections: Secondary | ICD-10-CM

## 2023-02-16 DIAGNOSIS — N3281 Overactive bladder: Secondary | ICD-10-CM

## 2023-02-16 LAB — MICROSCOPIC EXAMINATION

## 2023-02-16 LAB — URINALYSIS, COMPLETE
Bilirubin, UA: NEGATIVE
Glucose, UA: NEGATIVE
Ketones, UA: NEGATIVE
Leukocytes,UA: NEGATIVE
Nitrite, UA: NEGATIVE
Specific Gravity, UA: 1.015 (ref 1.005–1.030)
Urobilinogen, Ur: 0.2 mg/dL (ref 0.2–1.0)
pH, UA: 6 (ref 5.0–7.5)

## 2023-02-16 LAB — BLADDER SCAN AMB NON-IMAGING

## 2023-02-16 MED ORDER — GEMTESA 75 MG PO TABS: 1.0000 | ORAL_TABLET | Freq: Every day | ORAL | Status: DC

## 2023-02-16 NOTE — Progress Notes (Signed)
I, [name], acting as a scribe for Kara Altes, MD., have documented all relevant documentation on the behalf of Kara Altes, MD, as directed by Kara Altes, MD while in the presence of Kara Altes, MD.  02/16/2023 1:50 PM   Kara Wright 02-25-1946 [id number]  Referring provider: Joaquim Nam, MD [address] [place],  [place] [address]  Chief Complaint  Patient presents with   New Patient (Initial Visit)   Recurrent UTI    HPI: Kara Wright is a 77 y.o. female referred for evaluation of recurrent UTIs.   Seen by PCP 11/02/22 with complaints of frequency, urgency, and dysuria. Dipstick urinalysis showed small leukocytes. however urine culture had no significant growth. Her symptoms did improve on antibiotic therapy.  Seen 11/28/22 with similar symptoms and urine culture grew E. coli; symptoms resolved with antibiotic therapy, however seen again 12/28/22 with recurrent symptoms in urine culture and growing E. coli.  Again symptoms resolved, however she has been asymptomatic since that time.  She has had overactive bladder symptoms of frequency, urgency, and occasional episodes of urge incontinence, though these have been worse recently.  Denies gross hematuria.  No febrile UTIs. Asymptomatic today with the exception of frequency and urgency.   PMH: Past Medical History:  Diagnosis Date   Atrial fib/flutter, transient (HCC)    Cataract 2019   left eye   Clotting disorder (HCC)    on xarelto   Diverticulosis 03/1997   Dyspnea    Dysrhythmia    atrial fibrilation   GERD (gastroesophageal reflux disease) 1997   good on prilosec   Hepatitis    hx of hep B (late 1970s)   Hyperlipidemia 08/1996   Hypothyroidism 1984   Migraine    w/o aura   Osteopenia 11/2002   via dexa   Presence of dental prosthetic device    temporary while waiting for implant   Skin cancer    skin cancer   Sleep apnea    Stroke (HCC) 08/2011   TIA     Surgical History: Past Surgical History:  Procedure Laterality Date   ABDOMINAL HYSTERECTOMY     ANAL FISSURE REPAIR     CATARACT EXTRACTION W/PHACO Right 02/06/2018   Procedure: CATARACT EXTRACTION PHACO AND INTRAOCULAR LENS PLACEMENT (IOC) RIGHT;  Surgeon: Lockie Mola, MD;  Location: [facility];  Service: Ophthalmology;  Laterality: Right;   CHOLECYSTECTOMY  07/2006   LYMPH NODE BIOPSY Right 03/16/2021   Procedure: LYMPH NODE BIOPSY;  Surgeon: Renford Dills, MD;  Location: [facility];  Service: Vascular;  Laterality: Right;   MOHS SURGERY  12/05/2006   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 Earnestine Leys    Home Medications:  Allergies as of 02/16/2023       Reactions   Aurothioglucose Itching   Other reaction(s): Other (See Comments)   Celebrex [celecoxib]    REACTION: nausea: GI UPSET   Doxycycline Other (See Comments)   GI upset- not an allergy.     Gold-containing Drug Products    Nickel    Skin rash, allergy   Other    Fragrance, phenylenediamine, contrast metal agents, gold   Simvastatin Anxiety   Aches but tolerates crestor Other reaction(s): Other (  See Comments) Aches but tolerates crestor Aches but tolerates crestor   Sodium Metabisulfite Other (See Comments), Nausea Only, Rash   Positive patch test Other reaction(s): Other (See Comments) Positive patch test Positive patch test Positive patch test        Medication List        Accurate as of February 16, 2023  1:50 PM. If you have any questions, ask your nurse or doctor.          STOP taking these medications    cephALEXin 500 MG capsule Commonly known as: KEFLEX Stopped by: Kara Wright       TAKE these medications    albuterol 108 (90 Base) MCG/ACT inhaler Commonly known  as: VENTOLIN HFA Inhale 2 puffs into the lungs every 6 (six) hours as needed for wheezing or shortness of breath.   alendronate 70 MG tablet Commonly known as: FOSAMAX TAKE ONE TABLET BY MOUTH EVERY WEEK Take with a full glass of water on an empty stomach.   clobetasol ointment 0.05 % Commonly known as: TEMOVATE Apply topically 2 (two) times daily.   D3 Super Strength 50 MCG (2000 UT) Caps Generic drug: Cholecalciferol Take 2 capsules (4,000 Units total) by mouth daily.   diltiazem 30 MG tablet Commonly known as: CARDIZEM TAKE ONE TABLET BY MOUTH THREE TIMES DAILY AS NEEDED   Dupixent 300 MG/2ML Sopn Generic drug: Dupilumab Inject 300 mg into the skin every 14 (fourteen) days.   Gemtesa 75 MG Tabs Generic drug: Vibegron Take 1 tablet (75 mg total) by mouth daily. What changed: how much to take Changed by: Kara Wright   levothyroxine 150 MCG tablet Commonly known as: SYNTHROID TAKE ONE TABLET BY MOUTH ON MONDAY THROUGH SATURDAY EACH WEEK and TAKE TWO TABLETS ONCE WEEKLY ON SUNDAY   loperamide 2 MG tablet Commonly known as: IMODIUM A-D Take 2 mg by mouth 2 (two) times daily as needed for diarrhea or loose stools.   loratadine 10 MG tablet Commonly known as: CLARITIN Take 1 tablet (10 mg total) by mouth daily.   methotrexate 2.5 MG tablet Commonly known as: RHEUMATREX Take 10 mg by mouth once a week.   omeprazole 20 MG capsule Commonly known as: PRILOSEC Take 1 capsule (20 mg total) by mouth daily.   promethazine 25 MG tablet Commonly known as: PHENERGAN TAKE ONE TABLET BY MOUTH EVERY 6 HOURS AS NEEDED FOR NAUSEA   propranolol ER 120 MG 24 hr capsule Commonly known as: INDERAL LA Take 1 capsule (120 mg total) by mouth at bedtime.   rivaroxaban 20 MG Tabs tablet Commonly known as: Xarelto TAKE ONE TABLET BY MOUTH IN THE EVENING WITH DINNER   rosuvastatin 5 MG tablet Commonly known as: CRESTOR TAKE ONE TABLET BY MOUTH AT BEDTIME   traMADol 50 MG  tablet Commonly known as: ULTRAM Take 1 tablet (50 mg total) by mouth every 12 (twelve) hours as needed.        Allergies:  Allergies  Allergen Reactions   Aurothioglucose Itching    Other reaction(s): Other (See Comments)   Celebrex [Celecoxib]     REACTION: nausea: GI UPSET   Doxycycline Other (See Comments)    GI upset- not an allergy.     Gold-Containing Drug Products    Nickel     Skin rash, allergy   Other     Fragrance, phenylenediamine, contrast metal agents, gold   Simvastatin Anxiety    Aches but tolerates crestor Other reaction(s): Other (See Comments) Aches but  tolerates crestor Aches but tolerates crestor   Sodium Metabisulfite Other (See Comments), Nausea Only and Rash    Positive patch test Other reaction(s): Other (See Comments) Positive patch test Positive patch test Positive patch test    Family History: Family History  Problem Relation Age of Onset   Hypertension Mother    Osteopenia Mother    Stroke Mother        deceased age 11   Colon cancer Father 25       mets   Allergies Sister    Breast cancer Cousin        pat cousin   Rectal cancer Neg Hx     Social History:  reports that she quit smoking about 36 years ago. Her smoking use included cigarettes. She has been exposed to tobacco smoke. She has never used smokeless tobacco. She reports that she does not drink alcohol and does not use drugs.   Physical Exam: BP (!) 159/86   Pulse 61   Ht 5' 4.5" (1.638 m)   Wt 223 lb (101.2 kg)   BMI 37.69 kg/m   Constitutional:  Alert and oriented, No acute distress. HEENT: [place] AT, moist mucus membranes.  Trachea midline, no masses. Cardiovascular: No clubbing, cyanosis, or edema. Respiratory: Normal respiratory effort, no increased work of breathing. GI: Abdomen is soft, nontender, nondistended, no abdominal masses Skin: No rashes, bruises or suspicious lesions. Neurologic: Grossly intact, no focal deficits, moving all 4  extremities. Psychiatric: Normal mood and affect.   Urinalysis Microscopy negative   Assessment & Plan:    1. Recurrent UTI She has had 2 culture-documented infections within 6 months and meets the AUA criteria for diagnosis of recurrent UTI in women.  We discussed potential causative factors including menopause, bacterial virulence/adherence.  Prevention strategies were reviewed including supplements of D-mannose or cranberry; low-dose vaginal estrogen, and a low-dose antibiotic prophylaxis. The PVR today was 136 mL.  She would initially like to try D-mannose. She was instructed to call for recurrent UTI symptoms.  If she remains asymptomatic, we'll follow up in 3 months and she was instructed to call earlier for recurrent UTI symptoms.   2. Overactive bladder with urge incontinence Symptoms worse recently and may be secondary to her recent infections.  She was entered into trial and medical management and given samples Gemtesa 75 mg daily x4 weeks.  She will call back regarding efficacy and if she desires to continue.  [facility] [address] [place], [place] [address] [phone]

## 2023-02-16 NOTE — Patient Instructions (Signed)
D-Mannose or Cranberry tablets for UTI prevention.

## 2023-02-17 ENCOUNTER — Encounter: Payer: Self-pay | Admitting: Urology

## 2023-02-19 NOTE — Progress Notes (Signed)
Reviewed at previous office appt   Thanks,  BLI  Josephine Igo, DO [facility] 02/19/2023 2:34 AM

## 2023-03-09 ENCOUNTER — Ambulatory Visit: Payer: PPO | Admitting: Acute Care

## 2023-03-16 ENCOUNTER — Encounter: Payer: Self-pay | Admitting: Internal Medicine

## 2023-03-16 ENCOUNTER — Ambulatory Visit: Payer: PPO | Admitting: Internal Medicine

## 2023-03-16 ENCOUNTER — Telehealth: Payer: Self-pay

## 2023-03-16 VITALS — BP 120/80 | HR 90 | Ht 64.5 in | Wt 225.0 lb

## 2023-03-16 DIAGNOSIS — Z800 Family history of malignant neoplasm of digestive organs: Secondary | ICD-10-CM

## 2023-03-16 DIAGNOSIS — Z7901 Long term (current) use of anticoagulants: Secondary | ICD-10-CM | POA: Diagnosis not present

## 2023-03-16 DIAGNOSIS — Z86010 Personal history of colonic polyps: Secondary | ICD-10-CM

## 2023-03-16 DIAGNOSIS — K219 Gastro-esophageal reflux disease without esophagitis: Secondary | ICD-10-CM

## 2023-03-16 MED ORDER — NA SULFATE-K SULFATE-MG SULF 17.5-3.13-1.6 GM/177ML PO SOLN: 1.0000 | Freq: Once | ORAL | 0 refills | Status: AC

## 2023-03-16 NOTE — Patient Instructions (Signed)
You have been scheduled for a colonoscopy. Please follow written instructions given to you at your visit today.   Please pick up your prep supplies at the pharmacy within the next 1-3 days.  If you use inhalers (even only as needed), please bring them with you on the day of your procedure.  DO NOT TAKE 7 DAYS PRIOR TO TEST- Trulicity (dulaglutide) Ozempic, Wegovy (semaglutide) Mounjaro (tirzepatide) Bydureon Bcise (exanatide extended release)  DO NOT TAKE 1 DAY PRIOR TO YOUR TEST Rybelsus (semaglutide) Adlyxin (lixisenatide) Victoza (liraglutide) Byetta (exanatide) ___________________________________________________________________________ _______________________________________________________  If your blood pressure at your visit was 140/90 or greater, please contact your primary care physician to follow up on this.  _______________________________________________________  If you are age 91 or older, your body mass index should be between 23-30. Your Body mass index is 38.02 kg/m. If this is out of the aforementioned range listed, please consider follow up with your Primary Care Provider.  If you are age 81 or younger, your body mass index should be between 19-25. Your Body mass index is 38.02 kg/m. If this is out of the aformentioned range listed, please consider follow up with your Primary Care Provider.   ________________________________________________________  The [facility] providers would like to encourage you to use [facility] to communicate with providers for non-urgent requests or questions.  Due to long hold times on the telephone, sending your provider a message by [facility] may be a faster and more efficient way to get a response.  Please allow 48 business hours for a response.  Please remember that this is for non-urgent requests.  _______________________________________________________

## 2023-03-16 NOTE — Telephone Encounter (Signed)
Call placed to pt regarding surgical clearance and the need for an IN OFFICE appointment.  Pt was driving and couldn't look at her calendar and states she will call back.  Carlyon Shadow, NP, has openings 03/27/23 in the [place] office.

## 2023-03-16 NOTE — Telephone Encounter (Signed)
[facility] Pre-operative Risk Assessment     Request for surgical clearance:     Endoscopy Procedure  What type of surgery is being performed?     Colonoscopy  When is this surgery scheduled?     04/24/2023  What type of clearance is required ?   Pharmacy  Are there any medications that need to be held prior to surgery and how long? Xarelto - 2 days  Practice name and name of physician performing surgery?      [facility]  What is your office phone and fax number?      Phone- [phone]  Fax- [phone]  Anesthesia type (None, local, MAC, general) ?       MAC

## 2023-03-16 NOTE — Progress Notes (Signed)
HISTORY OF PRESENT ILLNESS:  Kara Wright is a 77 y.o. female with multiple medical problems as listed below.  She has a history of paroxysmal atrial fibrillation for which she is on chronic anticoagulation therapy in the form of Xarelto.  Other medical problems as listed below.  She has a family history of colon cancer in her father before age 65 and a personal history of adenomatous colon polyps.  Most recent colonoscopy May 2019.  She was taken off of her Xarelto for that procedure.  Did well.  Follow-up in 5 years recommended.  Patient's please report that she is doing well.  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  Except for occasional constipation and occasional diarrhea she has no GI complaints.  No bleeding.  REVIEW OF SYSTEMS:  All non-GI ROS negative unless otherwise stated in the HPI. Past Medical History:  Diagnosis Date   Atrial fib/flutter, transient (HCC)    Cataract 2019   left eye   Clotting disorder (HCC)    on xarelto   Diverticulosis 03/1997   Dyspnea    Dysrhythmia    atrial fibrilation   GERD (gastroesophageal reflux disease) 1997   good on prilosec   Hepatitis    hx of hep B (late 1970s)   Hyperlipidemia 08/1996   Hypothyroidism 1984   Migraine    w/o aura   Osteopenia 11/2002   via dexa   Presence of dental prosthetic device    temporary while waiting for implant   Skin cancer    skin cancer   Sleep apnea    Stroke (HCC) 08/2011   TIA    Past Surgical History:  Procedure Laterality Date   ABDOMINAL HYSTERECTOMY     ANAL FISSURE REPAIR     CATARACT EXTRACTION W/PHACO Right 02/06/2018   Procedure: CATARACT EXTRACTION PHACO AND INTRAOCULAR LENS PLACEMENT (IOC) RIGHT;  Surgeon: Lockie Mola, MD;  Location: [facility];  Service: Ophthalmology;  Laterality: Right;   CHOLECYSTECTOMY  07/2006   LYMPH NODE BIOPSY Right 03/16/2021   Procedure: LYMPH NODE BIOPSY;  Surgeon: Renford Dills, MD;  Location: [facility];   Service: Vascular;  Laterality: Right;   MOHS SURGERY  12/05/2006   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 Earnestine Leys    Social History Kara Wright  reports that she quit smoking about 36 years ago. Her smoking use included cigarettes. She has been exposed to tobacco smoke. She has never used smokeless tobacco. She reports that she does not drink alcohol and does not use drugs.  family history includes Allergies in her sister; Breast cancer in her cousin; Colon cancer (age of onset: 58) in her father; Hypertension in her mother; Osteopenia in her mother; Stroke in her mother.  Allergies  Allergen Reactions   Aurothioglucose Itching    Other reaction(s): Other (See Comments)   Celebrex [Celecoxib]     REACTION: nausea: GI UPSET   Doxycycline Other (See Comments)    GI upset- not an allergy.     Gold-Containing Drug Products    Nickel     Skin rash, allergy   Other     Fragrance, phenylenediamine, contrast metal agents, gold   Simvastatin Anxiety    Aches but tolerates crestor Other  reaction(s): Other (See Comments) Aches but tolerates crestor Aches but tolerates crestor   Sodium Metabisulfite Other (See Comments), Nausea Only and Rash    Positive patch test Other reaction(s): Other (See Comments) Positive patch test Positive patch test Positive patch test       PHYSICAL EXAMINATION: Vital signs: BP 120/80   Pulse 90   Ht 5' 4.5" (1.638 m)   Wt 225 lb (102.1 kg)   BMI 38.02 kg/m   Constitutional: Pleasant, generally well-appearing, no acute distress Psychiatric: alert and oriented x3, cooperative Eyes: extraocular movements intact, anicteric, conjunctiva pink Mouth: oral pharynx moist, no lesions Neck: supple no lymphadenopathy Cardiovascular: heart regular rate and  rhythm, no murmur Lungs: clear to auscultation bilaterally Abdomen: soft, obese, nontender, nondistended, no obvious ascites, no peritoneal signs, normal bowel sounds, no organomegaly Rectal: Deferred until colonoscopy Extremities: no no clubbing or cyanosis.  Trace lower extremity edema bilaterally Skin: no lesions on visible extremities Neuro: No focal deficits.  Cranial nerves intact  ASSESSMENT:  1.  Personal history of adenomatous colon polyps. 2.  Family history of colon cancer in father less than age 39.  She is high risk for colon cancer. 3.  Chronic anticoagulation 4.  Multiple medical problems 5.  Morbid obesity 6.  GERD.  On PPI.  Asymptomatic   PLAN:  1.  Surveillance colonoscopy.  The patient is High RISK given her comorbidities, BMI, and the need to address chronic anticoagulation therapy.The nature of the procedure, as well as the risks, benefits, and alternatives were carefully and thoroughly reviewed with the patient. Ample time for discussion and questions allowed. The patient understood, was satisfied, and agreed to proceed. 2.  Would like to hold anticoagulation 2 days prior to the procedure as previous.  Anticipate resumption immediately post procedure.  We will check with her cardiologist. 3.  Reflux precautions with attention to weight loss 4.  Continue omeprazole for GERD 5.  Ongoing general medical and specialty care with PCP and other specialists

## 2023-03-16 NOTE — Telephone Encounter (Signed)
   Name: Kara Wright  DOB: Mar 01, 1946  MRN: [id number]  Primary Cardiologist: Julien Nordmann, MD  Chart reviewed as part of pre-operative protocol coverage. Because of Teliah Buffalo Stidd's past medical history and time since last visit, she will require a follow-up in-office visit in order to better assess preoperative cardiovascular risk.  Pre-op covering staff: - Please schedule appointment and call patient to inform them. If patient already had an upcoming appointment within acceptable timeframe, please add "pre-op clearance" to the appointment notes so provider is aware. - Please contact requesting surgeon's office via preferred method (i.e, phone, fax) to inform them of need for appointment prior to surgery.   This request is for [facility] hold only, but we have not seen the patient in over a year. Xarelto currently prescribed by her PCP. Requesting office can either reach out to the PCP for hold recommendations or we would need to wait for an in-office visit.     Roe Rutherford Jahid Weida, PA  03/16/2023, 2:57 PM

## 2023-03-19 ENCOUNTER — Other Ambulatory Visit: Payer: Self-pay | Admitting: Family Medicine

## 2023-03-21 ENCOUNTER — Other Ambulatory Visit: Payer: Self-pay | Admitting: Family Medicine

## 2023-03-21 NOTE — Telephone Encounter (Signed)
Pt has an in-office appt with Dr. Mariah Milling on 10/9 at 3pm.

## 2023-03-27 ENCOUNTER — Other Ambulatory Visit: Payer: Self-pay | Admitting: *Deleted

## 2023-03-27 ENCOUNTER — Telehealth: Payer: Self-pay | Admitting: Urology

## 2023-03-27 MED ORDER — GEMTESA 75 MG PO TABS: 1.0000 | ORAL_TABLET | Freq: Every day | ORAL | 11 refills | Status: DC

## 2023-03-27 NOTE — Telephone Encounter (Signed)
Pt would like a refill of Gemtesa 75 mg sent to [facility] in [place].

## 2023-03-27 NOTE — Progress Notes (Unsigned)
Cardiology Office Note  Date:  03/28/2023   ID:  Kamree, Wiens 1946/03/23, MRN [id number]  PCP:  Joaquim Nam, MD   Chief Complaint  Patient presents with   Pre-op Exam    Pre op clearance before colonoscopy. Patient feels well today. Medications reviewed verbally.     HPI:  Ms. Burgert is a very pleasant 77 year-old woman with  morbid obesity,  hypothyroidism,  remote smoking history for less than 10 years,  prior TIA,CVA.  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, family moved in with her, 76 year old who is not looking for a job  On CPAP, nasal pillow  EKG personally reviewed by myself on todays visit EKG Interpretation Date/Time:  Wednesday March 28 2023 15:32:05 EDT Ventricular Rate:  65 PR Interval:  170 QRS Duration:  82 QT Interval:  404 QTC Calculation: 420 R Axis:   75  Text Interpretation: Normal sinus rhythm with sinus arrhythmia Normal ECG When compared with ECG of 15-Mar-2021 15:59, No significant change was found Confirmed by Julien Nordmann [phone]) on 03/28/2023 3:42:26 PM    History reviewed Covid 08/2018 More atrial fib in recovery Echo 07/2018 1. The left ventricle has normal systolic function, with an ejection  fraction of 55-60%. The cavity size was normal. Left ventricular diastolic  parameters were normal.   atrial fibrillation April 03, 2018 2 hour spell on beach trip Took diltiazem x 3, propranolol 20 x1,  Finally went back to normal sinus rhythm Typically episodes of atrial fibrillation will last 20 to 30  minutes, this was the longest Prior episode before that was 3 to 4 months earlier  Hospitalization May 2018 Pneumonia with COPD exacerbation Treated with antibiotics and steroids -CT high-resolution without ILD findings  -Echocardiogram with normal EF and diastolic function  -D-dimer negative, low suspicion for PE  -Possible component of COPD, although no previous diagnosis.    February 22nd 2016 she had 30 minutes of tachycardia. She is concerned this could have been atrial fibrillation    08/25/2011,  right eye vision changes, right arm weakness and numbness lasting for 30-40 minutes. Symptoms seemed to resolve. The next day, on Saturday, she had some mouth and facial numbness and she went to the emergency room. She also had headache. They did a EKG and CT scan of the head that showed no stroke. Early in the week, she continued to have headache and mouth issues and she was taken to [facility]. She had MRA that showed small vessel disease, carotid ultrasound that showed mild plaque. She reports that she was in the hospital for 3 days. No atrial fibrillation was seen.   PMH:   has a past medical history of Atrial fib/flutter, transient (HCC), Cataract (2019), Clotting disorder (HCC), Diverticulosis (03/1997), Dyspnea, Dysrhythmia, GERD (gastroesophageal reflux disease) (1997), Hepatitis, Hyperlipidemia (08/1996), Hypothyroidism (1984), Migraine, Osteopenia (11/2002), Presence of dental prosthetic device, Skin cancer, Sleep apnea, and Stroke (HCC) (08/2011).  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 Earnestine Leys    Current Outpatient Medications  Medication Sig Dispense Refill   albuterol (VENTOLIN HFA) 108 (90 Base) MCG/ACT inhaler Inhale 2 puffs into the lungs every 6 (six) hours as needed for wheezing or shortness of breath. 8 g 3   alendronate (FOSAMAX) 70 MG tablet TAKE ONE TABLET BY MOUTH IN THE EARLY MORNING ON MONDAY TAKE WITH A FULL GLASS OF WATER ON AN EMPTY STOMACH. 3 tablet 10   Cholecalciferol (D3 SUPER STRENGTH) 50 MCG (2000 UT) CAPS Take 2 capsules (4,000 Units total) by mouth daily. 180 capsule 2   diltiazem (CARDIZEM) 30 MG tablet TAKE ONE TABLET BY MOUTH THREE TIMES DAILY AS NEEDED 90 tablet 1   Dupilumab (DUPIXENT) 300 MG/2ML SOPN Inject 300 mg into the skin every 14 (fourteen) days.     levothyroxine (SYNTHROID) 150 MCG tablet TAKE ONE TABLET BY MOUTH ON MONDAY THROUGH SATURDAY EACH WEEK and TAKE TWO TABLETS ONCE WEEKLY ON SUNDAY 110 tablet 3   loperamide (IMODIUM A-D) 2 MG tablet Take 2 mg by mouth 2 (two) times daily as needed for diarrhea or loose stools.     loratadine (CLARITIN) 10 MG tablet Take 1 tablet (10 mg total) by mouth daily.     Na Sulfate-K Sulfate-Mg Sulf 17.5-3.13-1.6 GM/177ML SOLN SMARTSIG:1 Kit(s) By Mouth Once     omeprazole (PRILOSEC) 20 MG capsule Take 1 capsule (20 mg total) by mouth daily. 90 capsule 2   promethazine (PHENERGAN) 25 MG tablet TAKE ONE TABLET BY MOUTH EVERY 6 HOURS AS NEEDED FOR NAUSEA 30 tablet 3   propranolol ER (INDERAL LA) 120 MG 24 hr capsule Take 1 capsule (120 mg total) by mouth at bedtime. 90 capsule 2   rivaroxaban (XARELTO) 20 MG TABS tablet TAKE ONE TABLET BY  MOUTH IN THE EVENING WITH DINNER 90 tablet 1   rosuvastatin (CRESTOR) 5 MG tablet TAKE ONE TABLET BY MOUTH AT BEDTIME 90 tablet 3   traMADol (ULTRAM) 50 MG tablet Take 1 tablet (50 mg total) by mouth every 12 (twelve) hours as needed. 50 tablet 3   Vibegron (GEMTESA) 75 MG TABS Take 1 tablet (75 mg total) by mouth daily. 30 tablet 11   No current facility-administered medications for this visit.     Allergies:   Aurothioglucose, Celebrex [celecoxib], Doxycycline, Gold-containing drug products, Nickel, Other, Simvastatin, and Sodium metabisulfite   Social History:  The patient  reports that she quit smoking about 36 years ago. Her smoking use included cigarettes. She has been exposed to tobacco smoke. She has never used smokeless tobacco. She reports that she does not drink alcohol and does not use drugs.   Family History:   family history includes Allergies in her sister; Breast cancer in her cousin; Colon cancer (age of onset: 30) in her father;  Hypertension in her mother; Osteopenia in her mother; Stroke in her mother.    Review of Systems: Review of Systems  Constitutional: Negative.   Respiratory: Negative.    Cardiovascular: Negative.   Gastrointestinal: Negative.   Musculoskeletal:  Positive for joint pain.  Neurological: Negative.   Psychiatric/Behavioral: Negative.    All other systems reviewed and are negative.   PHYSICAL EXAM: VS:  BP 116/80 (BP Location: Left Arm, Patient Position: Sitting, Cuff Size: Normal)   Pulse 65   Ht 5' 4.5" (1.638 m)   Wt 219 lb 12.8 oz (99.7 kg)   SpO2 96%   BMI 37.15 kg/m  , BMI Body mass index is 37.15 kg/m.  Constitutional:  oriented to person, place, and time. No distress.  HENT:  Head: Grossly normal Eyes:  no discharge. No scleral icterus.  Neck: No JVD, no carotid bruits  Cardiovascular: Regular rate and rhythm, no murmurs appreciated Pulmonary/Chest: Clear to auscultation bilaterally, no wheezes or rails Abdominal: Soft.  no  distension.  no tenderness.  Musculoskeletal: Normal range of motion Neurological:  normal muscle tone. Coordination normal. No atrophy Skin: Skin warm and dry Psychiatric: normal affect, pleasant  Recent Labs: 10/09/2022: ALT 11; BUN 13; Creatinine, Ser 0.71; Hemoglobin 11.4; Platelets 192.0; Potassium 4.0; Sodium 140; TSH 0.50    Lipid Panel Lab Results  Component Value Date   CHOL 107 10/09/2022   HDL 34.50 (L) 10/09/2022   LDLCALC 36 10/09/2022   TRIG 181.0 (H) 10/09/2022    Wt Readings from Last 3 Encounters:  03/28/23 219 lb 12.8 oz (99.7 kg)  03/16/23 225 lb (102.1 kg)  02/16/23 223 lb (101.2 kg)      ASSESSMENT AND PLAN:  Paroxysmal atrial fibrillation (HCC) Recommend she start  diltiazem ER 120 daily in the morning, continue propranolol ER in the evening in effort to suppress arrhythmia -Continue anticoagulation  Pure hypercholesterolemia Cholesterol is at goal on the current lipid regimen. No changes to the medications were made.  Essential hypertension Add diltiazem ER 120 daily as above  Morbid obesity (HCC) We have encouraged continued exercise, careful diet management in an effort to lose weight.  SOB Recommended walking program for conditioning    Total encounter time more than 30 minutes  Greater than 50% was spent in counseling and coordination of care with the patient    Orders Placed This Encounter  Procedures   EKG 12-Lead     Signed, Dossie Arbour, M.D., Ph.D. 03/28/2023  [facility] [place], [place] [phone]

## 2023-03-27 NOTE — Telephone Encounter (Signed)
Medication sent to [facility].

## 2023-03-28 ENCOUNTER — Encounter: Payer: Self-pay | Admitting: Cardiovascular Disease

## 2023-03-28 ENCOUNTER — Telehealth: Payer: Self-pay | Admitting: Urology

## 2023-03-28 ENCOUNTER — Ambulatory Visit: Payer: PPO | Attending: Cardiovascular Disease | Admitting: Cardiovascular Disease

## 2023-03-28 VITALS — BP 116/80 | HR 65 | Ht 64.5 in | Wt 219.8 lb

## 2023-03-28 DIAGNOSIS — E7849 Other hyperlipidemia: Secondary | ICD-10-CM

## 2023-03-28 DIAGNOSIS — Z87891 Personal history of nicotine dependence: Secondary | ICD-10-CM | POA: Diagnosis not present

## 2023-03-28 DIAGNOSIS — I480 Paroxysmal atrial fibrillation: Secondary | ICD-10-CM

## 2023-03-28 DIAGNOSIS — I10 Essential (primary) hypertension: Secondary | ICD-10-CM

## 2023-03-28 DIAGNOSIS — Z8673 Personal history of transient ischemic attack (TIA), and cerebral infarction without residual deficits: Secondary | ICD-10-CM

## 2023-03-28 DIAGNOSIS — G4730 Sleep apnea, unspecified: Secondary | ICD-10-CM | POA: Diagnosis not present

## 2023-03-28 MED ORDER — DILTIAZEM HCL ER COATED BEADS 120 MG PO CP24
120.0000 mg | ORAL_CAPSULE | Freq: Every day | ORAL | 3 refills | Status: DC
Start: 2023-03-28 — End: 2023-10-22

## 2023-03-28 NOTE — Patient Instructions (Addendum)
Medication Instructions:  Hold the xarelto 3 days before the colonscopy  Please start diltiazem ER 120 mg in the Am  If you need a refill on your cardiac medications before your next appointment, please call your pharmacy.   Lab work: No new labs needed  Testing/Procedures: No new testing needed  Follow-Up: At [facility], you and your health needs are our priority.  As part of our continuing mission to provide you with exceptional heart care, we have created designated Provider Care Teams.  These Care Teams include your primary Cardiologist (physician) and Advanced Practice Providers (APPs -  Physician Assistants and Nurse Practitioners) who all work together to provide you with the care you need, when you need it.  You will need a follow up appointment in 6 months  Providers on your designated Care Team:   Nicolasa Ducking, NP Eula Listen, PA-C Cadence Fransico Michael, [place]  COVID-19 Vaccine Information can be found at: [email or web address] For questions related to vaccine distribution or appointments, please email [email or web address] or call [phone].

## 2023-03-28 NOTE — Telephone Encounter (Signed)
Kara Wright is too expensive and pt wants to know if you can call in an alternative.

## 2023-03-29 MED ORDER — OXYBUTYNIN CHLORIDE ER 10 MG PO TB24: 10.0000 mg | ORAL_TABLET | Freq: Every day | ORAL | 11 refills | Status: DC

## 2023-03-29 NOTE — Addendum Note (Signed)
Addended by: Levada Schilling on: 03/29/2023 02:12 PM   Modules accepted: Orders

## 2023-03-29 NOTE — Telephone Encounter (Signed)
Let her know the side effects of this medication include dry mouth and constipation.  If she wants to try I can send in Rx oxybutynin XL 10 mg daily.  Schedule I month follow-up for symptom check and PVR

## 2023-03-29 NOTE — Telephone Encounter (Signed)
Pt LM on triage line stating that she would like to try Oxybutynin XL as the pharmacist has informed her that this is a tier 2 on her insurance. Please advise.

## 2023-03-29 NOTE — Telephone Encounter (Signed)
Need to find out what OAB meds are on her formulary  Patient is going to call her insurance and let us know .

## 2023-03-29 NOTE — Telephone Encounter (Signed)
Notified patient as instructed, patientwould like to try the medication . I sent medication in

## 2023-04-02 ENCOUNTER — Telehealth: Payer: Self-pay | Admitting: Emergency Medicine

## 2023-04-02 NOTE — Telephone Encounter (Signed)
-----   Message from Julien Nordmann sent at 03/31/2023  9:16 PM EDT ----- Yes she should be fine to hold Xarelto 2 days prior to colonoscopy Made recent medication changes for paroxysmal A-fib so I hope it is quiescent during her procedure Thx TGollan ----- Message ----- From: Hilarie Fredrickson, MD Sent: 03/16/2023   2:45 PM EDT To: Antonieta Iba, MD  Marcial Pacas, Mrs. Petkus is being scheduled for her surveillance colonoscopy.  As you know, she is on chronic Xarelto for A-fib.  We have held this in the past for her procedures.  Would like to do the same if you agree.  Typically hold 2 days prior and resume the day of this procedure.  Thanks for your help. Jonny Ruiz

## 2023-04-02 NOTE — Telephone Encounter (Signed)
Called and spoke with patient. Informed her of the following from Dr. Mariah Milling.  Yes she should be fine to hold Xarelto 2 days prior to colonoscopy Made recent medication changes for paroxysmal A-fib so I hope it is quiescent during her procedure Thx TGollan        Patient verbalizes understanding.

## 2023-04-09 ENCOUNTER — Encounter: Payer: Self-pay | Admitting: Internal Medicine

## 2023-04-12 ENCOUNTER — Telehealth: Payer: Self-pay

## 2023-04-12 NOTE — Telephone Encounter (Signed)
Clarified with patient that, per Dr. Mariah Milling, she could hold her Xarelto for 3 days prior to the procedure.  Patient agreed

## 2023-04-12 NOTE — Telephone Encounter (Signed)
-----   Message from Yancey Flemings sent at 04/01/2023  2:56 PM EDT ----- Thanks! ----- Message ----- From: Antonieta Iba, MD Sent: 03/31/2023   9:17 PM EDT To: Hilarie Fredrickson, MD; Jani Gravel, RN  Yes she should be fine to hold Xarelto 2 days prior to colonoscopy Made recent medication changes for paroxysmal A-fib so I hope it is quiescent during her procedure Thx TGollan ----- Message ----- From: Hilarie Fredrickson, MD Sent: 03/16/2023   2:45 PM EDT To: Antonieta Iba, MD  Marcial Pacas, Mrs. Cappelletti is being scheduled for her surveillance colonoscopy.  As you know, she is on chronic Xarelto for A-fib.  We have held this in the past for her procedures.  Would like to do the same if you agree.  Typically hold 2 days prior and resume the day of this procedure.  Thanks for your help. Jonny Ruiz

## 2023-04-16 ENCOUNTER — Other Ambulatory Visit: Payer: Self-pay | Admitting: Family Medicine

## 2023-04-18 ENCOUNTER — Other Ambulatory Visit: Payer: Self-pay | Admitting: Family Medicine

## 2023-04-19 NOTE — Telephone Encounter (Signed)
     LOV: 12/28/22 NEXT VISIT: nothing scheduled LAST REFILL:    TRAMADOL 50MG  TABS 50 Tablet 01/18/23

## 2023-04-24 ENCOUNTER — Ambulatory Visit: Payer: PPO | Admitting: Internal Medicine

## 2023-04-24 ENCOUNTER — Encounter: Payer: Self-pay | Admitting: Internal Medicine

## 2023-04-24 VITALS — BP 129/54 | HR 65 | Temp 98.2°F | Resp 21 | Ht 64.5 in | Wt 225.0 lb

## 2023-04-24 DIAGNOSIS — D122 Benign neoplasm of ascending colon: Secondary | ICD-10-CM

## 2023-04-24 DIAGNOSIS — Z860100 Personal history of colon polyps, unspecified: Secondary | ICD-10-CM

## 2023-04-24 DIAGNOSIS — Z1211 Encounter for screening for malignant neoplasm of colon: Secondary | ICD-10-CM | POA: Diagnosis not present

## 2023-04-24 DIAGNOSIS — Z800 Family history of malignant neoplasm of digestive organs: Secondary | ICD-10-CM | POA: Diagnosis not present

## 2023-04-24 DIAGNOSIS — I480 Paroxysmal atrial fibrillation: Secondary | ICD-10-CM | POA: Diagnosis not present

## 2023-04-24 DIAGNOSIS — D123 Benign neoplasm of transverse colon: Secondary | ICD-10-CM | POA: Diagnosis not present

## 2023-04-24 DIAGNOSIS — Z860101 Personal history of adenomatous and serrated colon polyps: Secondary | ICD-10-CM | POA: Diagnosis not present

## 2023-04-24 DIAGNOSIS — D120 Benign neoplasm of cecum: Secondary | ICD-10-CM | POA: Diagnosis not present

## 2023-04-24 DIAGNOSIS — Z7901 Long term (current) use of anticoagulants: Secondary | ICD-10-CM | POA: Diagnosis not present

## 2023-04-24 DIAGNOSIS — G4730 Sleep apnea, unspecified: Secondary | ICD-10-CM | POA: Diagnosis not present

## 2023-04-24 MED ORDER — SODIUM CHLORIDE 0.9 % IV SOLN: 500.0000 mL | Freq: Once | INTRAVENOUS | Status: DC

## 2023-04-24 NOTE — Progress Notes (Unsigned)
Expand All Collapse All HISTORY OF PRESENT ILLNESS:   Kara Wright is a 77 y.o. female with multiple medical problems as listed below.  She has a history of paroxysmal atrial fibrillation for which she is on chronic anticoagulation therapy in the form of Xarelto.  Other medical problems as listed below.  She has a family history of colon cancer in her father before age 47 and a personal history of adenomatous colon polyps.  Most recent colonoscopy May 2019.  She was taken off of her Xarelto for that procedure.  Did well.  Follow-up in 5 years recommended.   Patient's please report that she is doing well.  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  Except for occasional constipation and occasional diarrhea she has no GI complaints.  No bleeding.   REVIEW OF SYSTEMS:   All non-GI ROS negative unless otherwise stated in the HPI.     Past Medical History:  Diagnosis Date   Atrial fib/flutter, transient (HCC)     Cataract 2019    left eye   Clotting disorder (HCC)      on xarelto   Diverticulosis 03/1997   Dyspnea     Dysrhythmia      atrial fibrilation   GERD (gastroesophageal reflux disease) 1997    good on prilosec   Hepatitis      hx of hep B (late 1970s)   Hyperlipidemia 08/1996   Hypothyroidism 1984   Migraine      w/o aura   Osteopenia 11/2002    via dexa   Presence of dental prosthetic device      temporary while waiting for implant   Skin cancer      skin cancer   Sleep apnea     Stroke (HCC) 08/2011    TIA               Past Surgical History:  Procedure Laterality Date   ABDOMINAL HYSTERECTOMY       ANAL FISSURE REPAIR       CATARACT EXTRACTION W/PHACO Right 02/06/2018    Procedure: CATARACT EXTRACTION PHACO AND INTRAOCULAR LENS PLACEMENT (IOC) RIGHT;  Surgeon: Lockie Mola, MD;  Location: [facility];  Service: Ophthalmology;  Laterality: Right;   CHOLECYSTECTOMY   07/2006   LYMPH NODE BIOPSY Right 03/16/2021    Procedure:  LYMPH NODE BIOPSY;  Surgeon: Renford Dills, MD;  Location: [facility];  Service: Vascular;  Laterality: Right;   MOHS SURGERY   12/05/2006    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 Earnestine Leys          Social History Kristopher Delk  reports that she quit smoking about 36 years ago. Her smoking use included cigarettes. She has been exposed to tobacco smoke. She has never used smokeless tobacco. She reports that she does not drink alcohol and does not use drugs.   family history includes Allergies in her sister; Breast cancer in her cousin; Colon cancer (age of onset: 25) in her father; Hypertension in her mother; Osteopenia in her mother; Stroke in her mother.   Allergies       Allergies  Allergen Reactions   Aurothioglucose Itching  Other reaction(s): Other (See Comments)   Celebrex [Celecoxib]        REACTION: nausea: GI UPSET   Doxycycline Other (See Comments)      GI upset- not an allergy.     Gold-Containing Drug Products     Nickel        Skin rash, allergy   Other        Fragrance, phenylenediamine, contrast metal agents, gold   Simvastatin Anxiety      Aches but tolerates crestor Other reaction(s): Other (See Comments) Aches but tolerates crestor Aches but tolerates crestor   Sodium Metabisulfite Other (See Comments), Nausea Only and Rash      Positive patch test Other reaction(s): Other (See Comments) Positive patch test Positive patch test Positive patch test            PHYSICAL EXAMINATION: Vital signs: BP 120/80   Pulse 90   Ht 5' 4.5" (1.638 m)   Wt 225 lb (102.1 kg)   BMI 38.02 kg/m   Constitutional: Pleasant, generally well-appearing, no acute distress Psychiatric: alert and oriented x3, cooperative Eyes: extraocular movements intact,  anicteric, conjunctiva pink Mouth: oral pharynx moist, no lesions Neck: supple no lymphadenopathy Cardiovascular: heart regular rate and rhythm, no murmur Lungs: clear to auscultation bilaterally Abdomen: soft, obese, nontender, nondistended, no obvious ascites, no peritoneal signs, normal bowel sounds, no organomegaly Rectal: Deferred until colonoscopy Extremities: no no clubbing or cyanosis.  Trace lower extremity edema bilaterally Skin: no lesions on visible extremities Neuro: No focal deficits.  Cranial nerves intact   ASSESSMENT:   1.  Personal history of adenomatous colon polyps. 2.  Family history of colon cancer in father less than age 98.  She is high risk for colon cancer. 3.  Chronic anticoagulation 4.  Multiple medical problems 5.  Morbid obesity 6.  GERD.  On PPI.  Asymptomatic     PLAN:   1.  Surveillance colonoscopy.  The patient is High RISK given her comorbidities, BMI, and the need to address chronic anticoagulation therapy.The nature of the procedure, as well as the risks, benefits, and alternatives were carefully and thoroughly reviewed with the patient. Ample time for discussion and questions allowed. The patient understood, was satisfied, and agreed to proceed. 2.  Would like to hold anticoagulation 2 days prior to the procedure as previous.  Anticipate resumption immediately post procedure.  We will check with her cardiologist. 3.  Reflux precautions with attention to weight loss 4.  Continue omeprazole for GERD 5.  Ongoing general medical and specialty care with PCP and other specialists

## 2023-04-24 NOTE — Op Note (Signed)
[facility] Patient Name: Kara Wright Procedure Date: 04/24/2023 12:23 PM MRN: [id number] Endoscopist: Wilhemina Bonito. Marina Goodell , MD, [id number] Age: 77 Referring MD:  Date of Birth: 28-Apr-1946 Gender: Female Account #: [id number] Procedure:                Colonoscopy with cold snare polypectomy x 3; biopsy                            polypectomy x 1 Indications:              High risk colon cancer surveillance: Personal                            history of non-advanced adenoma. Family history of                            colon cancer in father less than age 34. Previous                            examinations 2008, 2013, 2019. Medicines:                Monitored Anesthesia Care Procedure:                Pre-Anesthesia Assessment:                           - Prior to the procedure, a History and Physical                            was performed, and patient medications and                            allergies were reviewed. The patient's tolerance of                            previous anesthesia was also reviewed. The risks                            and benefits of the procedure and the sedation                            options and risks were discussed with the patient.                            All questions were answered, and informed consent                            was obtained. Prior Anticoagulants: The patient has                            taken Xarelto (rivaroxaban), last dose was 4 days                            prior to procedure. ASA Grade Assessment: III - A  patient with severe systemic disease. After                            reviewing the risks and benefits, the patient was                            deemed in satisfactory condition to undergo the                            procedure.                           After obtaining informed consent, the colonoscope                            was passed under direct vision. Throughout the                             procedure, the patient's blood pressure, pulse, and                            oxygen saturations were monitored continuously. The                            CF [id number] #[id number] was introduced through the anus                            and advanced to the the cecum, identified by                            appendiceal orifice and ileocecal valve. The                            ileocecal valve, appendiceal orifice, and rectum                            were photographed. The quality of the bowel                            preparation was excellent. The colonoscopy was                            performed without difficulty. The patient tolerated                            the procedure well. The bowel preparation used was                            SUPREP via split dose instruction. Scope In: 12:28:41 PM Scope Out: 12:41:30 PM Scope Withdrawal Time: 0 hours 10 minutes 30 seconds  Total Procedure Duration: 0 hours 12 minutes 49 seconds  Findings:                 Three polyps were found in the descending colon,  ascending colon and cecum. The polyps were 2 to 5                            mm in size. These polyps were removed with a cold                            snare. Resection and retrieval were complete.                           A 1 mm polyp was found in the ascending colon. The                            polyp was removed with a jumbo cold forceps.                            Resection and retrieval were complete.                           Diverticula were found in the sigmoid colon and                            right colon.                           Internal hemorrhoids were found during retroflexion.                           The exam was otherwise without abnormality on                            direct and retroflexion views. Complications:            No immediate complications. Estimated blood loss:                             None. Estimated Blood Loss:     Estimated blood loss: none. Recommendation:           - Repeat colonoscopy is not recommended for                            surveillance.                           - Resume Xarelto (rivaroxaban) today at prior dose.                           - Patient has a contact number available for                            emergencies. The signs and symptoms of potential                            delayed complications were discussed with the  patient. Return to normal activities tomorrow.                            Written discharge instructions were provided to the                            patient.                           - Resume previous diet.                           - Continue present medications.                           - Await pathology results. Wilhemina Bonito. Marina Goodell, MD 04/24/2023 12:48:46 PM This report has been signed electronically.

## 2023-04-24 NOTE — Progress Notes (Unsigned)
Called to room to assist during endoscopic procedure.  Patient ID and intended procedure confirmed with present staff. Received instructions for my participation in the procedure from the performing physician.  

## 2023-04-24 NOTE — Patient Instructions (Addendum)
Handouts Provided:  Polyps and Diverticulosis  RESUME Xarelto today at prior dose.   YOU HAD AN ENDOSCOPIC PROCEDURE TODAY AT THE [facility]:   Refer to the procedure report that was given to you for any specific questions about what was found during the examination.  If the procedure report does not answer your questions, please call your gastroenterologist to clarify.  If you requested that your care partner not be given the details of your procedure findings, then the procedure report has been included in a sealed envelope for you to review at your convenience later.  YOU SHOULD EXPECT: Some feelings of bloating in the abdomen. Passage of more gas than usual.  Walking can help get rid of the air that was put into your GI tract during the procedure and reduce the bloating. If you had a lower endoscopy (such as a colonoscopy or flexible sigmoidoscopy) you may notice spotting of blood in your stool or on the toilet paper. If you underwent a bowel prep for your procedure, you may not have a normal bowel movement for a few days.  Please Note:  You might notice some irritation and congestion in your nose or some drainage.  This is from the oxygen used during your procedure.  There is no need for concern and it should clear up in a day or so.  SYMPTOMS TO REPORT IMMEDIATELY:  Following lower endoscopy (colonoscopy or flexible sigmoidoscopy):  Excessive amounts of blood in the stool  Significant tenderness or worsening of abdominal pains  Swelling of the abdomen that is new, acute  Fever of 100F or higher  For urgent or emergent issues, a gastroenterologist can be reached at any hour by calling [phone]. Do not use MyChart messaging for urgent concerns.    DIET:  We do recommend a small meal at first, but then you may proceed to your regular diet.  Drink plenty of fluids but you should avoid alcoholic beverages for 24 hours.  ACTIVITY:  You should plan to take it easy for the  rest of today and you should NOT DRIVE or use heavy machinery until tomorrow (because of the sedation medicines used during the test).    FOLLOW UP: Our staff will call the number listed on your records the next business day following your procedure.  We will call around 7:15- 8:00 am to check on you and address any questions or concerns that you may have regarding the information given to you following your procedure. If we do not reach you, we will leave a message.     If any biopsies were taken you will be contacted by phone or by letter within the next 1-3 weeks.  Please call us at [phone] if you have not heard about the biopsies in 3 weeks.    SIGNATURES/CONFIDENTIALITY: You and/or your care partner have signed paperwork which will be entered into your electronic medical record.  These signatures attest to the fact that that the information above on your After Visit Summary has been reviewed and is understood.  Full responsibility of the confidentiality of this discharge information lies with you and/or your care-partner.

## 2023-04-24 NOTE — Progress Notes (Unsigned)
Pt's states no medical or surgical changes since previsit or office visit. 

## 2023-04-24 NOTE — Progress Notes (Unsigned)
Vss nad trans to pacu 

## 2023-04-25 ENCOUNTER — Telehealth: Payer: Self-pay

## 2023-04-25 NOTE — Telephone Encounter (Signed)
  Follow up Call-     04/24/2023   11:10 AM  Call back number  Post procedure Call Back phone  # [phone]  Permission to leave phone message Yes     Patient questions:  Do you have a fever, pain , or abdominal swelling? No. Pain Score  0 *  Have you tolerated food without any problems? Yes.    Have you been able to return to your normal activities? Yes.    Do you have any questions about your discharge instructions: Diet   No. Medications  No. Follow up visit  No.  Do you have questions or concerns about your Care? No.  Actions: * If pain score is 4 or above: No action needed, pain <4.

## 2023-04-27 ENCOUNTER — Encounter: Payer: Self-pay | Admitting: Internal Medicine

## 2023-04-27 LAB — SURGICAL PATHOLOGY

## 2023-05-23 ENCOUNTER — Encounter: Payer: Self-pay | Admitting: Urology

## 2023-05-23 ENCOUNTER — Ambulatory Visit: Payer: PPO | Admitting: Urology

## 2023-05-23 VITALS — BP 143/78 | HR 65 | Ht 64.5 in | Wt 225.0 lb

## 2023-05-23 DIAGNOSIS — Z87440 Personal history of urinary (tract) infections: Secondary | ICD-10-CM | POA: Diagnosis not present

## 2023-05-23 DIAGNOSIS — N3941 Urge incontinence: Secondary | ICD-10-CM | POA: Diagnosis not present

## 2023-05-23 DIAGNOSIS — N3281 Overactive bladder: Secondary | ICD-10-CM | POA: Diagnosis not present

## 2023-05-23 DIAGNOSIS — N390 Urinary tract infection, site not specified: Secondary | ICD-10-CM

## 2023-05-23 LAB — BLADDER SCAN AMB NON-IMAGING: Scan Result: 1

## 2023-05-23 MED ORDER — SOLIFENACIN SUCCINATE 10 MG PO TABS: 10.0000 mg | ORAL_TABLET | Freq: Every day | ORAL | 0 refills | Status: DC

## 2023-05-23 NOTE — Progress Notes (Signed)
I, [name], acting as a scribe for [name], MD., have documented all relevant documentation on the behalf of [name], MD, as directed by [name], MD while in the presence of [name], MD.  05/23/2023 3:35 PM   [name] Jul 09, 1945 [id number]  Referring provider: Joaquim Nam, MD [address] [place],  [place] [address]  Chief Complaint  Patient presents with   Recurrent UTI   Urologic history: 1. Recurrent UTI D-mannose   2. Overactive bladder with urge incontinence Beta 3 agonist effective but cost prohibitive On anticholinergic therapy  HPI: Kara Wright is a 77 y.o. female presents for follow-up visit.   Initially seen 02/16/2023 with recurrent UTIs and overactive bladder with urge incontinence.  After discussing options for UTI prevention, she initially elected D-mannose, and since her last visit, has had no urinary tract infections.  She was given a trial of Gemtesa for her OAB symptoms with improvement, though it was cost prohibitive and she requested a trial of oxybutynin XL. Her symptoms have significantly improved, however she does complain of significant dry mouth.   PMH: Past Medical History:  Diagnosis Date   Atrial fib/flutter, transient (HCC)    Cataract 2019   left eye   Clotting disorder (HCC)    on xarelto   Diverticulosis 03/1997   Dyspnea    Dysrhythmia    atrial fibrilation   GERD (gastroesophageal reflux disease) 1997   good on prilosec   Hepatitis    hx of hep B (late 1970s)   Hyperlipidemia 08/1996   Hypothyroidism 1984   Migraine    w/o aura   Osteopenia 11/2002   via dexa   Presence of dental prosthetic device    temporary while waiting for implant   Skin cancer    skin cancer   Sleep apnea    Stroke (HCC) 08/2011   TIA    Surgical History: Past Surgical History:  Procedure Laterality Date   ABDOMINAL HYSTERECTOMY     ANAL FISSURE REPAIR     CATARACT EXTRACTION  W/PHACO Right 02/06/2018   Procedure: CATARACT EXTRACTION PHACO AND INTRAOCULAR LENS PLACEMENT (IOC) RIGHT;  Surgeon: Lockie Mola, MD;  Location: [facility];  Service: Ophthalmology;  Laterality: Right;   CHOLECYSTECTOMY  07/2006   LYMPH NODE BIOPSY Right 03/16/2021   Procedure: LYMPH NODE BIOPSY;  Surgeon: Renford Dills, MD;  Location: [facility];  Service: Vascular;  Laterality: Right;   MOHS SURGERY  12/05/2006   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 Earnestine Leys    Home Medications:  Allergies as of 05/23/2023       Reactions   Aurothioglucose Itching   Other reaction(s): Other (See Comments)   Celebrex [celecoxib]    REACTION: nausea: GI UPSET   Doxycycline Other (See Comments)   GI upset- not an allergy.     Gold-containing Drug Products    Nickel    Skin rash, allergy   Other    Fragrance, phenylenediamine, contrast metal agents, gold   Simvastatin Anxiety   Aches but tolerates crestor Other reaction(s): Other (See Comments) Aches but tolerates crestor Aches but tolerates crestor   Sodium Metabisulfite Other (See Comments), Nausea Only, Rash  Positive patch test Other reaction(s): Other (See Comments) Positive patch test Positive patch test Positive patch test        Medication List        Accurate as of May 23, 2023  3:35 PM. If you have any questions, ask your nurse or doctor.          albuterol 108 (90 Base) MCG/ACT inhaler Commonly known as: VENTOLIN HFA Inhale 2 puffs into the lungs every 6 (six) hours as needed for wheezing or shortness of breath.   alendronate 70 MG tablet Commonly known as: FOSAMAX TAKE ONE TABLET BY MOUTH IN THE EARLY MORNING ON MONDAY TAKE WITH A FULL GLASS OF WATER ON AN EMPTY STOMACH.   D3 Super Strength 50  MCG (2000 UT) Caps Generic drug: Cholecalciferol Take 2 capsules (4,000 Units total) by mouth daily.   diltiazem 120 MG 24 hr capsule Commonly known as: CARDIZEM CD Take 1 capsule (120 mg total) by mouth daily.   diltiazem 30 MG tablet Commonly known as: CARDIZEM TAKE ONE TABLET BY MOUTH THREE TIMES DAILY AS NEEDED   Dupixent 300 MG/2ML Soaj Generic drug: Dupilumab Inject 300 mg into the skin every 14 (fourteen) days.   levothyroxine 150 MCG tablet Commonly known as: SYNTHROID TAKE ONE TABLET BY MOUTH ON MONDAY THROUGH SATURDAY EACH WEEK and TAKE TWO TABLETS ONCE WEEKLY ON SUNDAY   loperamide 2 MG tablet Commonly known as: IMODIUM A-D Take 2 mg by mouth 2 (two) times daily as needed for diarrhea or loose stools.   loratadine 10 MG tablet Commonly known as: CLARITIN Take 1 tablet (10 mg total) by mouth daily.   omeprazole 20 MG capsule Commonly known as: PRILOSEC Take 1 capsule (20 mg total) by mouth daily.   oxybutynin 10 MG 24 hr tablet Commonly known as: DITROPAN-XL Take 1 tablet (10 mg total) by mouth daily.   promethazine 25 MG tablet Commonly known as: PHENERGAN TAKE ONE TABLET BY MOUTH EVERY 6 HOURS AS NEEDED FOR NAUSEA   propranolol ER 120 MG 24 hr capsule Commonly known as: INDERAL LA Take 1 capsule (120 mg total) by mouth at bedtime.   rosuvastatin 5 MG tablet Commonly known as: CRESTOR TAKE ONE TABLET BY MOUTH AT BEDTIME   solifenacin 10 MG tablet Commonly known as: VESICARE Take 1 tablet (10 mg total) by mouth daily. Started by: [name]   traMADol 50 MG tablet Commonly known as: ULTRAM Take 1 tablet (50 mg total) by mouth every 12 (twelve) hours as needed.   Xarelto 20 MG Tabs tablet Generic drug: rivaroxaban TAKE ONE TABLET BY MOUTH IN THE EVENING WITH DINNER        Allergies:  Allergies  Allergen Reactions   Aurothioglucose Itching    Other reaction(s): Other (See Comments)   Celebrex [Celecoxib]     REACTION: nausea: GI  UPSET   Doxycycline Other (See Comments)    GI upset- not an allergy.     Gold-Containing Drug Products    Nickel     Skin rash, allergy   Other     Fragrance, phenylenediamine, contrast metal agents, gold   Simvastatin Anxiety    Aches but tolerates crestor Other reaction(s): Other (See Comments) Aches but tolerates crestor Aches but tolerates crestor   Sodium Metabisulfite Other (See Comments), Nausea Only and Rash    Positive patch test Other reaction(s): Other (See Comments) Positive patch test Positive patch test Positive patch test    Family History: Family History  Problem Relation  Age of Onset   Hypertension Mother    Osteopenia Mother    Stroke Mother        deceased age 44   Colon cancer Father 46       mets   Allergies Sister    Breast cancer Cousin        pat cousin   Rectal cancer Neg Hx     Social History:  reports that she quit smoking about 36 years ago. Her smoking use included cigarettes. She has been exposed to tobacco smoke. She has never used smokeless tobacco. She reports that she does not drink alcohol and does not use drugs.   Physical Exam: BP (!) 143/78   Pulse 65   Ht 5' 4.5" (1.638 m)   Wt 225 lb (102.1 kg)   BMI 38.02 kg/m   Constitutional:  Alert and oriented, No acute distress. HEENT: [place] AT Respiratory: Normal respiratory effort, no increased work of breathing. Psychiatric: Normal mood and affect.   Assessment & Plan:    1. Recurrent UTI Patient asymptomatic without recurrent infection since last visit.  Continue D-mannose.   2. Overactive bladder with urge incontinence We discussed dry mouth as a common side effect of all anticholinergic medications for overactive bladder. She was interested in a trial of a different medication in that class, and Rx solifenacin 10 mg was sent to her pharmacy. If there is less side effects with solifenacin, she will call back for an Rx.  1 year follow-up and instructed to call earlier for  worsening voiding symptoms or recurrent UTI.   I have reviewed the above documentation for accuracy and completeness, and I agree with the above.   [name], MD  [facility] [address], [address] [place], [place] [address] [phone]

## 2023-05-25 ENCOUNTER — Encounter: Payer: Self-pay | Admitting: Urology

## 2023-05-28 ENCOUNTER — Other Ambulatory Visit (HOSPITAL_BASED_OUTPATIENT_CLINIC_OR_DEPARTMENT_OTHER): Payer: PPO

## 2023-05-28 ENCOUNTER — Ambulatory Visit: Payer: PPO

## 2023-06-05 ENCOUNTER — Ambulatory Visit: Payer: PPO

## 2023-06-11 ENCOUNTER — Ambulatory Visit
Admission: RE | Admit: 2023-06-11 | Discharge: 2023-06-11 | Disposition: A | Discharge status: Home / Self Care | Payer: PPO | Source: Ambulatory Visit | Attending: Pulmonary Disease | Admitting: Pulmonary Disease

## 2023-06-11 DIAGNOSIS — R918 Other nonspecific abnormal finding of lung field: Secondary | ICD-10-CM | POA: Diagnosis not present

## 2023-06-11 DIAGNOSIS — I700 Atherosclerosis of aorta: Secondary | ICD-10-CM | POA: Diagnosis not present

## 2023-07-17 ENCOUNTER — Other Ambulatory Visit: Payer: Self-pay | Admitting: Family Medicine

## 2023-07-18 ENCOUNTER — Encounter: Payer: Self-pay | Admitting: Family Medicine

## 2023-07-18 ENCOUNTER — Other Ambulatory Visit: Payer: Self-pay

## 2023-07-18 MED ORDER — D3 SUPER STRENGTH 50 MCG (2000 UT) PO CAPS: 2.0000 | ORAL_CAPSULE | Freq: Every day | ORAL | 2 refills | Status: DC

## 2023-07-18 MED ORDER — OMEPRAZOLE 20 MG PO CPDR: 20.0000 mg | DELAYED_RELEASE_CAPSULE | Freq: Every day | ORAL | 2 refills | Status: DC

## 2023-07-18 MED ORDER — PROPRANOLOL HCL ER 120 MG PO CP24: 120.0000 mg | ORAL_CAPSULE | Freq: Every day | ORAL | 2 refills | Status: DC

## 2023-07-18 NOTE — Telephone Encounter (Signed)
I refilled all except the Tramadol  Last office visit: 12/28/22 Next office visit: nothing scheduled Last refill: TRAMADOL 50MG  TABS 50 Tablet 04/19/2023 60 tablets 2 refills

## 2023-07-23 ENCOUNTER — Institutional Professional Consult (permissible substitution): Payer: PPO | Admitting: Pulmonary Disease

## 2023-08-02 ENCOUNTER — Ambulatory Visit: Payer: PPO | Admitting: Internal Medicine

## 2023-08-02 ENCOUNTER — Encounter: Payer: Self-pay | Admitting: Internal Medicine

## 2023-08-02 VITALS — BP 128/72 | HR 62 | Temp 98.6°F | Resp 14 | Ht 64.5 in | Wt 223.0 lb

## 2023-08-02 DIAGNOSIS — Z87898 Personal history of other specified conditions: Secondary | ICD-10-CM

## 2023-08-02 DIAGNOSIS — G4733 Obstructive sleep apnea (adult) (pediatric): Secondary | ICD-10-CM | POA: Diagnosis not present

## 2023-08-02 NOTE — Progress Notes (Signed)
Past medical history of atrial fib flutter, clotting disorder on Xarelto, hyperlipidemia, hypothyroidism, history of TIA stroke. Patient recently had follow up CT imaging that revealed stable nodule in the LUL and new nodule in the RLL.  In addition the patient has a past 8 to 56-month history of dyspnea on exertion.  She states that she gets "short winded" when she is climbs a hill or climb stairs.  When asked about her sleep her husband pipes up that she does snore at times.  She is not sure if she thinks her sleep is restful or not.  Patient does not wheeze.  She occasionally has random left-sided sharp pains in the chest.  This is been going on for some time.  She follows with cardiology regarding her atrial fibrillation.  Review of her CT imaging today in the office also reveals enlarged pulmonary arteries.  This was discussed with the patient.  OV 12/18/2018: Patient seen here today for follow-up regarding multiple pulmonary nodules.  Patient had CT imaging of the chest completed on 12/13/2018 for follow-up regarding her nodules.  There is a new small 5 mm nodule in the right pulmonary apex a new 4 mm nodule in the anterior left upper lobe prior nodules either resolved or stable. Today, she states that she has breathing better.  She does have some dyspnea on exertion.  She was unable to get her sleep appointment scheduled.  We will try again for this.  She was very anxious about having the CT scan for the follow-up of the nodules.  She is relieved to hear that they are smaller.  She does understand there is a few new ones that need follow-up.  Otherwise doing well.  She had PFTs completed prior to today's office which which we reviewed.  It does reveal a reduced FEV1 and FVC with a reserved ratio and a reduced ERV of 10%.  DLCO of 74%.  This is likely related to her weight.  BMI of 40.  We discussed this today in the office as well.  She is trying to watch her weight and her dietary intake.  She does  understand importance of losing weight.  OV 07/22/2020: Patient here today for follow-up regarding recent CT scan of the chest for follow-up of lung nodules.  Patient had a CT scan of the chest on 07/12/2020 which revealed persistent waxing and waning bilateral lung nodules.  The left lower lobe lung nodule 1 cm in size seems to be the largest left the other upper lobe nodules that was seen previously are no longer present have subsided she also has some small adenopathy within the chest.  She does have enlarged pulmonary arteries consistent with possible underlying PAH.  From respiratory standpoint she is stable.  She does not have cough sputum production night sweats weight loss fevers.    OV 06/22/2022: Here today for follow-up.  She had a CT scan in August which showed stability of her pulmonary nodules.  She has no other significant symptoms at this time.  She fell this morning and out the back door when she was trying to move a dog water dish.  She states she is doing okay from that.  She has been working on her weight.  We talked about various weight loss options today in the office.    CC Follow-up assessment for OSA Follow-up assessment for pulmonary nodules  HPI Assessment for OSA Previous HST shows AHI of 38 Compliance report reviewed in detail with  patient Excellent compliance 100% Patient use and benefits from therapy Patient is having a hard time with the mask We will plan to change masks  CT chest shows a history of pulmonary nodules Most recent CT chest December 2024 shows resolution and stability of pulmonary nodules I do not recommend any more follow-up CT scans   No evidence of heart failure at this time No evidence or signs of infection at this time No respiratory distress No fevers, chills, nausea, vomiting, diarrhea No evidence of lower extremity edema No evidence hemoptysis          Past Medical History:  Diagnosis Date  . Atrial fib/flutter, transient  (HCC)   . Cataract 2019   left eye  . Clotting disorder (HCC)    on xarelto  . Diverticulosis 03/1997  . Dyspnea   . Dysrhythmia    atrial fibrilation  . GERD (gastroesophageal reflux disease) 1997   good on prilosec  . Hepatitis    hx of hep B (late 1970s)  . Hyperlipidemia 08/1996  . Hypothyroidism 1984  . Migraine    w/o aura  . Osteopenia 11/2002   via dexa  . Skin cancer    skin cancer  . Sleep apnea   . Stroke [facility]) 08/2011   TIA     Family History  Problem Relation Age of Onset  . Hypertension Mother   . Osteopenia Mother   . Stroke Mother        deceased age 35  . Colon cancer Father 60       mets  . Allergies Sister   . Breast cancer Cousin        pat cousin  . Rectal cancer Neg Hx      Past Surgical History:  Procedure Laterality Date  . ABDOMINAL HYSTERECTOMY    . ANAL FISSURE REPAIR    . CATARACT EXTRACTION W/PHACO Right 02/06/2018   Procedure: CATARACT EXTRACTION PHACO AND INTRAOCULAR LENS PLACEMENT (IOC) RIGHT;  Surgeon: Lockie Mola, MD;  Location: [facility];  Service: Ophthalmology;  Laterality: Right;  . CHOLECYSTECTOMY  07/2006  . LYMPH NODE BIOPSY Right 03/16/2021   Procedure: LYMPH NODE BIOPSY;  Surgeon: Renford Dills, MD;  Location: [facility];  Service: Vascular;  Laterality: Right;  . 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 nasolabial fold skin flap surgery  08/02/2007   [facility]  . PARTIAL HYSTERECTOMY  1978-79  . POLYPECTOMY  04/06/97   colon, benign by pathology  . varicose vein ablation sclerotherapy  09/2008   Dr. Earnestine Leys    Social History   Socioeconomic History  . Marital status: Widowed    Spouse name: Not on file  . Number of children: 2  . Years of education: Not on file  . Highest education level: Not on file  Occupational History  . Occupation: Chartered certified accountant: RETIRED    Comment: [employer]  . 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 Smoking status: Former    Current packs/day: 0.00    Types: Cigarettes    Quit date: 06/26/1986    Years since quitting: 37.1    Passive exposure: Past  . Smokeless tobacco: Never  Vaping Use  . Vaping status: Never Used  Substance and Sexual Activity  . Alcohol use: No  . Drug use: No  . Sexual activity: Not Currently  Other Topics Concern  .  Not on file  Social History Narrative   Worked part time at Dr. 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     Married 1979---widowed 10/22   2 daughters from prev relationship   [employer] fan, [employer] fan.   Social Drivers of Health   Financial Resource Strain: Low Risk  (05/16/2021)   Overall Financial Resource Strain (CARDIA)   . Difficulty of Paying Living Expenses: Not very hard  Food Insecurity: No Food Insecurity (08/21/2019)   Hunger Vital Sign   . Worried About Programme researcher, broadcasting/film/video in the Last Year: Never true   . Ran Out of Food in the Last Year: Never true  Transportation Needs: No Transportation Needs (08/21/2019)   PRAPARE - Transportation   . Lack of Transportation (Medical): No   . Lack of Transportation (Non-Medical): No  Physical Activity: Inactive (08/21/2019)   Exercise Vital Sign   . Days of Exercise per Week: 0 days   . Minutes of Exercise per Session: 0 min  Stress: No Stress Concern Present (08/21/2019)   [employer] of Occupational Health - Occupational Stress Questionnaire   . Feeling of Stress : Not at all  Social Connections: Not on file  Intimate Partner Violence: Not At Risk (08/21/2019)   Humiliation, Afraid, Rape, and Kick questionnaire   . Fear of Current or Ex-Partner: No   . Emotionally Abused: No   . Physically Abused: No   . Sexually Abused: No     Allergies  Allergen Reactions  . Aurothioglucose Itching    Other reaction(s): Other (See Comments)  . Celebrex [Celecoxib]     REACTION: nausea: GI UPSET  . Doxycycline Other (See Comments)    GI upset- not an  allergy.    . Gold-Containing Drug Products   . Nickel     Skin rash, allergy  . Other     Fragrance, phenylenediamine, contrast metal agents, gold  . Simvastatin Anxiety    Aches but tolerates crestor Other reaction(s): Other (See Comments) Aches but tolerates crestor Aches but tolerates crestor  . Sodium Metabisulfite Other (See Comments), Nausea Only and Rash    Positive patch test Other reaction(s): Other (See Comments) Positive patch test Positive patch test Positive patch test     Outpatient Medications Prior to Visit  Medication Sig Dispense Refill  . albuterol (VENTOLIN HFA) 108 (90 Base) MCG/ACT inhaler Inhale 2 puffs into the lungs every 6 (six) hours as needed for wheezing or shortness of breath. 8 g 3  . alendronate (FOSAMAX) 70 MG tablet TAKE ONE TABLET BY MOUTH IN THE EARLY MORNING ON MONDAY TAKE WITH A FULL GLASS OF WATER ON AN EMPTY STOMACH. 3 tablet 10  . Cholecalciferol (D3 SUPER STRENGTH) 50 MCG (2000 UT) CAPS Take 2 capsules (4,000 Units total) by mouth daily. 180 capsule 2  . diltiazem (CARDIZEM CD) 120 MG 24 hr capsule Take 1 capsule (120 mg total) by mouth daily. 90 capsule 3  . diltiazem (CARDIZEM) 30 MG tablet TAKE ONE TABLET BY MOUTH THREE TIMES DAILY AS NEEDED 90 tablet 1  . Dupilumab (DUPIXENT) 300 MG/2ML SOPN Inject 300 mg into the skin every 14 (fourteen) days.    Marland Kitchen levothyroxine (SYNTHROID) 150 MCG tablet TAKE ONE TABLET BY MOUTH ON MONDAY THROUGH SATURDAY EACH WEEK and TAKE TWO TABLETS ONCE WEEKLY ON SUNDAY 110 tablet 3  . loperamide (IMODIUM A-D) 2 MG tablet Take 2 mg by mouth 2 (two) times daily as needed for diarrhea or loose stools.    Marland Kitchen  loratadine (CLARITIN) 10 MG tablet Take 1 tablet (10 mg total) by mouth daily.    Marland Kitchen omeprazole (PRILOSEC) 20 MG capsule Take 1 capsule (20 mg total) by mouth daily. 90 capsule 2  . oxybutynin (DITROPAN-XL) 10 MG 24 hr tablet Take 1 tablet (10 mg total) by mouth daily. 30 tablet 11  . promethazine (PHENERGAN) 25 MG  tablet TAKE ONE TABLET BY MOUTH EVERY 6 HOURS AS NEEDED FOR NAUSEA 30 tablet 3  . propranolol ER (INDERAL LA) 120 MG 24 hr capsule Take 1 capsule (120 mg total) by mouth at bedtime. 90 capsule 2  . rosuvastatin (CRESTOR) 5 MG tablet TAKE ONE TABLET BY MOUTH AT BEDTIME 90 tablet 3  . solifenacin (VESICARE) 10 MG tablet Take 1 tablet (10 mg total) by mouth daily. 90 tablet 0  . traMADol (ULTRAM) 50 MG tablet TAKE 1 TABLET BY MOUTH EVERY 12 HOURS AS NEEDED 60 tablet 2  . XARELTO 20 MG TABS tablet TAKE ONE TABLET BY MOUTH IN THE EVENING WITH DINNER 30 tablet 3   No facility-administered medications prior to visit.    There were no vitals filed for this visit.    on RA BMI Readings from Last 3 Encounters:  05/23/23 38.02 kg/m  04/24/23 38.02 kg/m  03/28/23 37.15 kg/m   Wt Readings from Last 3 Encounters:  05/23/23 225 lb (102.1 kg)  04/24/23 225 lb (102.1 kg)  03/28/23 219 lb 12.8 oz (99.7 kg)   BP 128/72   Pulse 62   Temp 98.6 F (37 C) (Temporal)   Resp 14   Ht 5' 4.5" (1.638 m)   Wt 223 lb (101.2 kg)   SpO2 93%   BMI 37.69 kg/m     Review of Systems: Gen:  Denies  fever, sweats, chills weight loss  HEENT: Denies blurred vision, double vision, ear pain, eye pain, hearing loss, nose bleeds, sore throat Cardiac:  No dizziness, chest pain or heaviness, chest tightness,edema, No JVD Resp:   No cough, -sputum production, -shortness of breath,-wheezing, -hemoptysis,  Other:  All other systems negative   Physical Examination:   General Appearance: No distress  EYES PERRLA, EOM intact.   NECK Supple, No JVD Pulmonary: normal breath sounds, No wheezing.  CardiovascularNormal S1,S2.  No m/r/g.   Abdomen: Benign, Soft, non-tender. Neurology UE/LE 5/5 strength, no focal deficits Ext pulses intact, cap refill intact ALL OTHER ROS ARE NEGATIVE   CBC    Component Value Date/Time   WBC 5.7 10/09/2022 0938   RBC 4.50 10/09/2022 0938   HGB 11.4 (L) 10/09/2022 0938   HGB  13.8 08/26/2011 1334   HCT 35.3 (L) 10/09/2022 0938   HCT 41.2 08/26/2011 1334   PLT 192.0 10/09/2022 0938   PLT 156 08/26/2011 1334   MCV 78.5 10/09/2022 0938   MCV 83 08/26/2011 1334   MCH 25.5 (L) 04/07/2021 1231   MCHC 32.1 10/09/2022 0938   RDW 18.8 (H) 10/09/2022 0938   RDW 13.8 08/26/2011 1334   LYMPHSABS 1.8 10/09/2022 0938   MONOABS 0.5 10/09/2022 0938   EOSABS 0.3 10/09/2022 0938   BASOSABS 0.1 10/09/2022 0938    Chest Imaging: 07/29/2018 CTA chest 10 mm right lower lobe pulmonary nodule, 5 mm right upper lobe pulmonary nodule, left upper lobe pulmonary nodule The patient's images have been independently reviewed by me.    June 2020 CT chest: Resolved pulmonary nodule as compared to previous.  New upper lobe pulmonary nodule.  Recommending 39-month follow-up. The patient's images have been independently  reviewed by me.    CT scan of the chest January 2022: Bilateral waxing and waning pulmonary nodules largest left lower lobe approximately 1 cm in size. The patient's images have been independently reviewed by me.    August 2023 CT: Stable lung nodules \  CT chest December 2024-independently reviewed by me today Nodules seen on prior study have improved or resolved. Largest residual nodule is 6 mm in the left upper lobe compared to 7 mm previously. This nodule is stable dating back to 01/10/2021 compatible with benign nodule.  No acute cardiopulmonary disease.     Pulmonary Functions Testing Results:    Latest Ref Rng & Units 12/18/2018    1:49 PM  PFT Results  FVC-Pre L 1.78   FVC-Predicted Pre % 59   FVC-Post L 1.81   FVC-Predicted Post % 60   Pre FEV1/FVC % % 77   Post FEV1/FCV % % 75   FEV1-Pre L 1.37   FEV1-Predicted Pre % 61   FEV1-Post L 1.36   DLCO uncorrected ml/min/mmHg 14.66   DLCO [facility]% % 74   DLVA Predicted % 98   TLC L 4.26   TLC % Predicted % 83   RV % Predicted % 106     FeNO: None   Pathology: None   Echocardiogram:   10/27/2016: Normal ejection fraction 60 to 65%, mild LVH. 08/16/2018: 1. The left ventricle has normal systolic function, with an ejection fraction of 55-60%. The cavity size was normal. Left ventricular diastolic parameters were normal.  2. The right ventricle has normal systolic function. The cavity was normal. There is no increase in right ventricular wall thickness.  3. Left atrial size was moderately dilated.  4. Right atrial size was mildly dilated.  5. The mitral valve is degenerative. Moderate thickening of the mitral valve leaflet. Mild calcification of the mitral valve leaflet.  6. The tricuspid valve is normal in structure.  7. The aortic valve is tricuspid Moderate thickening of the aortic valve Moderate sclerosis of the aortic valve.  Heart Catheterization: None     Assessment & Plan:   78 year old morbidly obese white female with underlying diagnosis of sleep apnea with incidental finding of multiple pulmonary nodules that have been waxing and waning over the last several years   Assessment of OSA Initial HST shows severe OSA with AHI of 38  Assessment of Sleep apnea Patient has excellent compliance report Discussed in detail with patient OSA is well-controlled with CPAP Continue current prescription AHI well-controlled down to 2 Auto CPAP 5-18 Will change masks  referral to DME company [id number] M[employer]   Patient Instructions Continue to use CPAP every night, minimum of 4-6 hours a night.  Change equipment every 30 days or as directed by DME.  Wash your tubing with warm soap and water daily, hang to dry. Wash humidifier portion weekly. Use bottled, distilled water and change daily   Be aware of reduced alertness and do not drive or operate heavy machinery if experiencing this or drowsiness.  Exercise encouraged, as tolerated. Encouraged proper weight management.  Important to get eight or more hours of sleep  Limiting the use of the computer  and television before bedtime.  Decrease naps during the day, so night time sleep will become enhanced.  Limit caffeine, and sleep deprivation.  HTN, stroke, uncontrolled diabetes and heart failure are potential risk factors.  Risk of untreated sleep apnea including cardiac arrhthymias, stroke, DM, pulm HTN.    Assessment pulmonary nodules  Patient has waxing and waning pulmonary nodules which are considered to be benign given the fact that they have been stable over the last 4 years based on CT scan findings Most recent CT chest shows resolution of nodules At this time I do not recommend follow-up CT scans unless patient has symptoms or feels uncomfortable and wants to assess pulmonary nodules   Obesity -recommend significant weight loss -recommend changing diet  Deconditioned state -Recommend increased daily activity and exercise   Underlying reactive airways disease related to obesity Use albuterol as needed  Sleep hygiene handout provided    Current Outpatient Medications:  .  albuterol (VENTOLIN HFA) 108 (90 Base) MCG/ACT inhaler, Inhale 2 puffs into the lungs every 6 (six) hours as needed for wheezing or shortness of breath., Disp: 8 g, Rfl: 3 .  alendronate (FOSAMAX) 70 MG tablet, TAKE ONE TABLET BY MOUTH IN THE EARLY MORNING ON MONDAY TAKE WITH A FULL GLASS OF WATER ON AN EMPTY STOMACH., Disp: 3 tablet, Rfl: 10 .  Cholecalciferol (D3 SUPER STRENGTH) 50 MCG (2000 UT) CAPS, Take 2 capsules (4,000 Units total) by mouth daily., Disp: 180 capsule, Rfl: 2 .  diltiazem (CARDIZEM CD) 120 MG 24 hr capsule, Take 1 capsule (120 mg total) by mouth daily., Disp: 90 capsule, Rfl: 3 .  diltiazem (CARDIZEM) 30 MG tablet, TAKE ONE TABLET BY MOUTH THREE TIMES DAILY AS NEEDED, Disp: 90 tablet, Rfl: 1 .  Dupilumab (DUPIXENT) 300 MG/2ML SOPN, Inject 300 mg into the skin every 14 (fourteen) days., Disp: , Rfl:  .  levothyroxine (SYNTHROID) 150 MCG tablet, TAKE ONE TABLET BY MOUTH ON MONDAY  THROUGH SATURDAY EACH WEEK and TAKE TWO TABLETS ONCE WEEKLY ON SUNDAY, Disp: 110 tablet, Rfl: 3 .  loperamide (IMODIUM A-D) 2 MG tablet, Take 2 mg by mouth 2 (two) times daily as needed for diarrhea or loose stools., Disp: , Rfl:  .  loratadine (CLARITIN) 10 MG tablet, Take 1 tablet (10 mg total) by mouth daily., Disp: , Rfl:  .  omeprazole (PRILOSEC) 20 MG capsule, Take 1 capsule (20 mg total) by mouth daily., Disp: 90 capsule, Rfl: 2 .  oxybutynin (DITROPAN-XL) 10 MG 24 hr tablet, Take 1 tablet (10 mg total) by mouth daily., Disp: 30 tablet, Rfl: 11 .  promethazine (PHENERGAN) 25 MG tablet, TAKE ONE TABLET BY MOUTH EVERY 6 HOURS AS NEEDED FOR NAUSEA, Disp: 30 tablet, Rfl: 3 .  propranolol ER (INDERAL LA) 120 MG 24 hr capsule, Take 1 capsule (120 mg total) by mouth at bedtime., Disp: 90 capsule, Rfl: 2 .  rosuvastatin (CRESTOR) 5 MG tablet, TAKE ONE TABLET BY MOUTH AT BEDTIME, Disp: 90 tablet, Rfl: 3 .  solifenacin (VESICARE) 10 MG tablet, Take 1 tablet (10 mg total) by mouth daily., Disp: 90 tablet, Rfl: 0 .  traMADol (ULTRAM) 50 MG tablet, TAKE 1 TABLET BY MOUTH EVERY 12 HOURS AS NEEDED, Disp: 60 tablet, Rfl: 2 .  XARELTO 20 MG TABS tablet, TAKE ONE TABLET BY MOUTH IN THE EVENING WITH DINNER, Disp: 30 tablet, Rfl: 3     MEDICATION ADJUSTMENTS/LABS AND TESTS ORDERED: Referral to DME company [employer] Continue CPAP as prescribed Avoid Allergens and Irritants Avoid secondhand smoke Avoid SICK contacts Recommend  Masking  when appropriate Recommend Keep up-to-date with vaccinations   CURRENT MEDICATIONS REVIEWED AT LENGTH WITH PATIENT TODAY   Patient  satisfied with Plan of action and management. All questions answered   Follow up  3 months   I  spent a total of 45 minutes reviewing chart data, face-to-face evaluation with the patient, counseling and coordination of care as detailed above.      Lucie Leather, M.D.  [facility]  Medical Director [facility] Medical Director [facility] Cardio-Pulmonary Department

## 2023-08-02 NOTE — Patient Instructions (Signed)
Referral to DME company [employer]  Excellent Job A+ GOLD STAR!!  Continue CPAP as prescribed  Avoid Allergens and Irritants Avoid secondhand smoke Avoid SICK contacts Recommend  Masking  when appropriate Recommend Keep up-to-date with vaccinations   Be aware of reduced alertness and do not drive or operate heavy machinery if experiencing this or drowsiness.  Exercise encouraged, as tolerated. Encouraged proper weight management.  Important to get eight or more hours of sleep  Limiting the use of the computer and television before bedtime.  Decrease naps during the day, so night time sleep will become enhanced.  Limit caffeine, and sleep deprivation.

## 2023-08-03 ENCOUNTER — Telehealth: Payer: Self-pay

## 2023-08-03 ENCOUNTER — Other Ambulatory Visit: Payer: Self-pay

## 2023-08-03 MED ORDER — OMEPRAZOLE 20 MG PO CPDR: 20.0000 mg | DELAYED_RELEASE_CAPSULE | Freq: Every day | ORAL | 2 refills | Status: DC

## 2023-08-03 MED ORDER — PROPRANOLOL HCL ER 120 MG PO CP24: 120.0000 mg | ORAL_CAPSULE | Freq: Every day | ORAL | 2 refills | Status: DC

## 2023-08-03 MED ORDER — D3 SUPER STRENGTH 50 MCG (2000 UT) PO CAPS: 2.0000 | ORAL_CAPSULE | Freq: Every day | ORAL | 2 refills | Status: DC

## 2023-08-03 NOTE — Telephone Encounter (Signed)
Copied from CRM [id number]. Topic: Clinical - Medication Question >> Aug 03, 2023 12:59 PM Almira Coaster wrote: Reason for CRM: Patient received a message from her pharmacy stating Dr.Duncan cancelled prescriptions for omeprazole (PRILOSEC) 20 MG capsule,propranolol ER (INDERAL LA) 120 MG 24 hr capsule, Vitamin D, she would like to know why.

## 2023-08-03 NOTE — Telephone Encounter (Signed)
Spoke with [facility] and canceled the 3 rx's that was sent there and sent them to [facility] per patient

## 2023-08-03 NOTE — Telephone Encounter (Signed)
She should have active rx with refills for all 3 at the pharmacy.  These meds were refilled relatively recently.   Please check with pharmacy and update patient.  It doesn't look like these were cancelled.  Thanks.

## 2023-08-06 ENCOUNTER — Ambulatory Visit (INDEPENDENT_AMBULATORY_CARE_PROVIDER_SITE_OTHER): Payer: PPO

## 2023-08-06 VITALS — Ht 64.5 in | Wt 223.0 lb

## 2023-08-06 DIAGNOSIS — Z0000 Encounter for general adult medical examination without abnormal findings: Secondary | ICD-10-CM

## 2023-08-06 NOTE — Progress Notes (Signed)
Please attest and cosign this visit due to patients primary care provider not being in the office at the time the visit was completed.    Subjective:   Kara Wright is a 78 y.o. female who presents for Medicare Annual (Subsequent) preventive examination.  Visit Complete: Virtual I connected with  Kara Wright on 08/06/23 by a audio enabled telemedicine application and verified that I am speaking with the correct person using two identifiers.  Patient Location: Home  Provider Location: Home Office  I discussed the limitations of evaluation and management by telemedicine. The patient expressed understanding and agreed to proceed.  Vital Signs: Because this visit was a virtual/telehealth visit, some criteria may be missing or patient reported. Any vitals not documented were not able to be obtained and vitals that have been documented are patient reported.  Patient Medicare AWV questionnaire was completed by the patient on (not done); I have confirmed that all information answered by patient is correct and no changes since this date.  Cardiac Risk Factors include: advanced age (>15men, >45 women);dyslipidemia;hypertension;obesity (BMI >30kg/m2)     Objective:    Today's Vitals   08/06/23 1013  Weight: 223 lb (101.2 kg)  Height: 5' 4.5" (1.638 m)   Body mass index is 37.69 kg/m.     08/06/2023   10:23 AM 04/07/2021   11:41 AM 03/16/2021    9:43 AM 03/15/2021    4:36 PM 09/30/2020    9:28 AM 08/09/2020    4:39 PM 03/08/2020    8:49 PM  Advanced Directives  Does Patient Have a Medical Advance Directive? No No No No No No No  Would patient like information on creating a medical advance directive?  No - Patient declined No - Patient declined No - Patient declined No - Patient declined  No - Patient declined    Current Medications (verified) Outpatient Encounter Medications as of 08/06/2023  Medication Sig   albuterol (VENTOLIN HFA) 108 (90 Base) MCG/ACT inhaler Inhale 2  puffs into the lungs every 6 (six) hours as needed for wheezing or shortness of breath.   alendronate (FOSAMAX) 70 MG tablet TAKE ONE TABLET BY MOUTH IN THE EARLY MORNING ON MONDAY TAKE WITH A FULL GLASS OF WATER ON AN EMPTY STOMACH.   Cholecalciferol (D3 SUPER STRENGTH) 50 MCG (2000 UT) CAPS Take 2 capsules (4,000 Units total) by mouth daily.   diltiazem (CARDIZEM CD) 120 MG 24 hr capsule Take 1 capsule (120 mg total) by mouth daily.   diltiazem (CARDIZEM) 30 MG tablet TAKE ONE TABLET BY MOUTH THREE TIMES DAILY AS NEEDED   Dupilumab (DUPIXENT) 300 MG/2ML SOPN Inject 300 mg into the skin every 14 (fourteen) days.   levothyroxine (SYNTHROID) 150 MCG tablet TAKE ONE TABLET BY MOUTH ON MONDAY THROUGH SATURDAY EACH WEEK and TAKE TWO TABLETS ONCE WEEKLY ON SUNDAY   loperamide (IMODIUM A-D) 2 MG tablet Take 2 mg by mouth 2 (two) times daily as needed for diarrhea or loose stools.   loratadine (CLARITIN) 10 MG tablet Take 1 tablet (10 mg total) by mouth daily.   omeprazole (PRILOSEC) 20 MG capsule Take 1 capsule (20 mg total) by mouth daily.   oxybutynin (DITROPAN-XL) 10 MG 24 hr tablet Take 1 tablet (10 mg total) by mouth daily.   promethazine (PHENERGAN) 25 MG tablet TAKE ONE TABLET BY MOUTH EVERY 6 HOURS AS NEEDED FOR NAUSEA   propranolol ER (INDERAL LA) 120 MG 24 hr capsule Take 1 capsule (120 mg total) by mouth at  bedtime.   rosuvastatin (CRESTOR) 5 MG tablet TAKE ONE TABLET BY MOUTH AT BEDTIME   solifenacin (VESICARE) 10 MG tablet Take 1 tablet (10 mg total) by mouth daily.   traMADol (ULTRAM) 50 MG tablet TAKE 1 TABLET BY MOUTH EVERY 12 HOURS AS NEEDED   XARELTO 20 MG TABS tablet TAKE ONE TABLET BY MOUTH IN THE EVENING WITH DINNER   No facility-administered encounter medications on file as of 08/06/2023.    Allergies (verified) Aurothioglucose, Celebrex [celecoxib], Doxycycline, Gold-containing drug products, Nickel, Other, Simvastatin, and Sodium metabisulfite   History: Past Medical  History:  Diagnosis Date   Atrial fib/flutter, transient (HCC)    Cataract 2019   left eye   Clotting disorder (HCC)    on xarelto   Diverticulosis 03/1997   Dyspnea    Dysrhythmia    atrial fibrilation   GERD (gastroesophageal reflux disease) 1997   good on prilosec   Hepatitis    hx of hep B (late 1970s)   Hyperlipidemia 08/1996   Hypothyroidism 1984   Migraine    w/o aura   Osteopenia 11/2002   via dexa   Skin cancer    skin cancer   Sleep apnea    Stroke (HCC) 08/2011   TIA   Past Surgical History:  Procedure Laterality Date   ABDOMINAL HYSTERECTOMY     ANAL FISSURE REPAIR     CATARACT EXTRACTION W/PHACO Right 02/06/2018   Procedure: CATARACT EXTRACTION PHACO AND INTRAOCULAR LENS PLACEMENT (IOC) RIGHT;  Surgeon: Lockie Mola, MD;  Location[facility];  Service: Ophthalmology;  Laterality: Right;   CHOLECYSTECTOMY  07/2006   LYMPH NODE BIOPSY Right 03/16/2021   Procedure: LYMPH NODE BIOPSY;  Surgeon: Renford Dills, MD;  Location: [facility];  Service: Vascular;  Laterality: Right;   MOHS SURGERY  12/05/2006   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 Earnestine Leys   Family History  Problem Relation Age of Onset   Hypertension Mother    Osteopenia Mother    Stroke Mother        deceased age 68   Colon cancer Father 2       mets   Allergies Sister    Breast cancer Cousin        pat cousin   Rectal cancer Neg Hx    Social History   Socioeconomic History   Marital status: Widowed    Spouse name: Not on file   Number of children: 2   Years of education: Not on file   Highest education level: Not on file  Occupational History   Occupation: Chartered certified accountant: RETIRED    Comment: 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     Married 1979---widowed 10/22   2 daughters from prev relationship   [employer] fan, [employer] fan.   Social Drivers of Corporate investment banker Strain: Low Risk  (08/06/2023)   Overall Financial Resource Strain (CARDIA)    Difficulty of Paying Living Expenses: Not hard at all  Food Insecurity: No Food Insecurity (08/06/2023)   Hunger Vital Sign    Worried About Running Out of Food in the Last Year: Never true    Ran Out of Food in the Last Year: Never true  Transportation Needs: No Transportation Needs (08/06/2023)   PRAPARE - Administrator, Civil Service (Medical): No    Lack of Transportation (Non-Medical): No  Physical Activity: Insufficiently Active (08/06/2023)   Exercise Vital Sign    Days of Exercise per Week: 3 days    Minutes of Exercise per Session: 30 min  Stress: No Stress Concern Present (08/06/2023)   [employer] of Occupational Health - Occupational Stress Questionnaire    Feeling of Stress : Only a little  Social Connections: Moderately Isolated (08/06/2023)   Social Connection and Isolation Panel [NHANES]    Frequency of Communication with Friends and Family: More than three times a week    Frequency of Social Gatherings with Friends and Family: More than three times a week    Attends Religious Services: More than 4 times per year    Active Member of [employer] or Organizations: No    Attends Banker Meetings: Never    Marital Status: Widowed     Tobacco Counseling Counseling given: Not Answered   Clinical Intake:  Pre-visit preparation completed: Yes  Pain : No/denies pain     BMI - recorded: 37.69 Nutritional Status: BMI > 30  Obese Nutritional Risks: None Diabetes: No  How often do you need to have someone help you when you read instructions, pamphlets, or other written materials from your doctor or pharmacy?: 1 - Never  Interpreter Needed?: No  Comments: otherws live with pt Information entered by :: B.Topanga Alvelo,LPN   Activities of Daily Living    08/06/2023   10:23 AM  In your present state of health, do you have any difficulty performing the following activities:  Hearing? 1  Vision? 0  Difficulty concentrating or making decisions? 0  Walking or climbing stairs? 0  Dressing or bathing? 0  Doing errands, shopping? 0  Preparing Food and eating ? N  Using the Toilet? N  In the past six months, have you accidently leaked urine? Y  Do you have problems with loss of bowel control? N  Managing your Medications? N  Managing your Finances? N  Housekeeping or managing your Housekeeping? N    Patient Care Team: Joaquim Nam, MD as PCP - General (Family Medicine) Mariah Milling Tollie Pizza, MD as PCP - Cardiology (Cardiology) Dasher, Cliffton Asters, MD as Consulting Physician (Dermatology) Antonieta Iba, MD as Consulting Physician (Cardiology) Verne Carrow, OD as Consulting Physician (Optometry) Marisa Sprinkles, DDS as Consulting Physician (Dentistry) Kathyrn Sheriff, [facility] (Inactive) as Pharmacist (Pharmacist)  Indicate any recent Medical Services you may have received from other than [facility] providers in the past year (date may be approximate).     Assessment:   This is a routine wellness examination for Kara Wright.  Hearing/Vision screen Hearing Screening - Comments:: Pt says she has new hearing aids this past year: not helping too much yet Vision  Screening - Comments:: Pt says her vision is good with  glasses Dr Katherina Mires   Goals Addressed             This Visit's Progress    Increase physical activity   Not on track    08/06/23- I will attempt to exercise for 60 minutes twice weekly and to make healthy food choices by eliminating added sugar in an effort to lose weight.      Patient Stated   Not on track    08/06/23- I will try to work on losing some weight and exercising more.        Depression Screen    08/06/2023   10:20 AM 12/28/2022    3:51 PM 10/16/2022   12:40 PM 09/04/2022    2:38 PM 01/10/2021    2:56 PM 08/21/2019    9:51 AM 08/08/2018   12:45 PM  PHQ 2/9 Scores  PHQ - 2 Score 0 0 0 0 0 0 0  PHQ- 9 Score  0 4 3  0 0    Fall Risk    08/06/2023   10:16 AM 12/28/2022    3:51 PM 10/16/2022   12:40 PM 09/04/2022    2:38 PM 10/13/2021   10:22 AM  Fall Risk   Falls in the past year? 0 1 1 0 0  Number falls in past yr: 0 1 1 0 0  Injury with Fall? 0 0 0 0 0  Risk for fall due to : No Fall Risks No Fall Risks No Fall Risks No Fall Risks No Fall Risks  Follow up Education provided;Falls prevention discussed Falls evaluation completed Falls evaluation completed Falls evaluation completed Falls evaluation completed    MEDICARE RISK AT HOME: Medicare Risk at Home Any stairs in or around the home?: Yes If so, are there any without handrails?: Yes Home free of loose throw rugs in walkways, pet beds, electrical cords, etc?: Yes Adequate lighting in your home to reduce risk of falls?: Yes Life alert?: No Use of a cane, walker or w/c?: No Grab bars in the bathroom?: Yes Shower chair or bench in shower?: Yes Elevated toilet seat or a handicapped toilet?: Yes  TIMED UP AND GO:  Was the test performed?  No    Cognitive Function:    08/21/2019    9:52 AM 08/08/2018   12:46 PM 08/06/2017    9:22 AM 08/03/2016    9:30 AM  MMSE - Mini Mental State Exam  Orientation to time 5 5 5 5   Orientation to Place 5 5 5 5   Registration 3 3 3 3   Attention/ Calculation 5 0 0 0   Recall 3 3 3 3   Language- name 2 objects  0 0 0  Language- repeat 1 1 1 1   Language- follow 3 step command  3 3 3   Language- read & follow direction  0 0 0  Write a sentence  0 0 0  Copy design  0 0 0  Total score  20 20 20         08/06/2023   10:24 AM  6CIT Screen  What Year? 0 points  What month? 0 points  What time? 0 points  Count back from 20 0 points  Months in reverse 0 points  Repeat phrase 0 points  Total Score 0 points    Immunizations Immunization History  Administered Date(s) Administered   Fluad Quad(high Dose 65+) 03/21/2019   Influenza Split 04/13/2011, 04/26/2012   Influenza Whole 04/07/2009,  04/13/2010   Influenza, High Dose Seasonal PF 04/09/2017, 04/18/2018, 04/28/2022   Influenza,inj,Quad PF,6+ Mos 05/01/2013, 04/30/2014, 04/05/2015, 06/05/2016   Influenza-Unspecified 04/10/2020, 05/06/2021   PFIZER(Purple Top)SARS-COV-2 Vaccination 08/06/2019, 08/27/2019   Pfizer Covid-19 Vaccine Bivalent Booster 52yrs & up 05/06/2021   Pfizer(Comirnaty)Fall Seasonal Vaccine 12 years and older 06/07/2022   Pneumococcal Conjugate-13 08/06/2014   Pneumococcal Polysaccharide-23 07/16/2012   RSV,unspecified 05/29/2022   Td 04/04/2006, 02/20/2012   Tdap 05/06/2021   Zoster Recombinant(Shingrix) 03/18/2020, 08/03/2020   Zoster, Live 05/02/2007    TDAP status: Up to date  Flu Vaccine status: Up to date  Pneumococcal vaccine status: Up to date  Covid-19 vaccine status: Completed vaccines  Qualifies for Shingles Vaccine? Yes   Zostavax completed Yes   Shingrix Completed?: Yes  Screening Tests Health Maintenance  Topic Date Due   INFLUENZA VACCINE  01/18/2023   COVID-19 Vaccine (5 - 2024-25 season) 02/18/2023   MAMMOGRAM  12/06/2023   Medicare Annual Wellness (AWV)  08/05/2024   DTaP/Tdap/Td (4 - Td or Tdap) 05/07/2031   Pneumonia Vaccine 32+ Years old  Completed   DEXA SCAN  Completed   Hepatitis C Screening  Completed   Zoster Vaccines- Shingrix   Completed   HPV VACCINES  Aged Out   Colonoscopy  Discontinued    Health Maintenance  Health Maintenance Due  Topic Date Due   INFLUENZA VACCINE  01/18/2023   COVID-19 Vaccine (5 - 2024-25 season) 02/18/2023    Colorectal cancer screening: No longer required.   Mammogram status: No longer required due to age.  Bone Density status: Ordered 07/20/2020. Pt provided with contact info and advised to call to schedule appt.  Lung Cancer Screening: (Low Dose CT Chest recommended if Age 16-80 years, 20 pack-year currently smoking OR have quit w/in 15years.) does not qualify.   Lung Cancer Screening Referral: no  Additional Screening:  Hepatitis C Screening: does not qualify; Completed 07/27/2016  Vision Screening: Recommended annual ophthalmology exams for early detection of glaucoma and other disorders of the eye. Is the patient up to date with their annual eye exam?  Yes  Who is the provider or what is the name of the office in which the patient attends annual eye exams? Dr Clearance Coots If pt is not established with a provider, would they like to be referred to a provider to establish care? No .   Dental Screening: Recommended annual dental exams for proper oral hygiene  Diabetic Foot Exam: n/a  Community Resource Referral / Chronic Care Management: CRR required this visit?  No   CCM required this visit?  Appt scheduled with PCP PE and Lab visit made     Plan:     I have personally reviewed and noted the following in the patient's chart:   Medical and social history Use of alcohol, tobacco or illicit drugs  Current medications and supplements including opioid prescriptions. Patient is not currently taking opioid prescriptions. Functional ability and status Nutritional status Physical activity Advanced directives List of other physicians Hospitalizations, surgeries, and ER visits in previous 12 months Vitals Screenings to include cognitive, depression, and falls Referrals and  appointments  In addition, I have reviewed and discussed with patient certain preventive protocols, quality metrics, and best practice recommendations. A written personalized care plan for preventive services as well as general preventive health recommendations were provided to patient.     Sue Lush, LPN   [id number]   After Visit Summary: (MyChart) Due to this being a telephonic visit, the after visit  summary with patients personalized plan was offered to patient via MyChart   Nurse Notes: The patient states she is doing well and has no concerns or questions at this time.

## 2023-08-06 NOTE — Patient Instructions (Signed)
Kara Wright , Thank you for taking time to come for your Medicare Wellness Visit. I appreciate your ongoing commitment to your health goals. Please review the following plan we discussed and let me know if I can assist you in the future.   Referrals/Orders/Follow-Ups/Clinician Recommendations: none  This is a list of the screening recommended for you and due dates:  Health Maintenance  Topic Date Due   Flu Shot  01/18/2023   COVID-19 Vaccine (5 - 2024-25 season) 02/18/2023   Mammogram  12/06/2023   Medicare Annual Wellness Visit  08/05/2024   DTaP/Tdap/Td vaccine (4 - Td or Tdap) 05/07/2031   Pneumonia Vaccine  Completed   DEXA scan (bone density measurement)  Completed   Hepatitis C Screening  Completed   Zoster (Shingles) Vaccine  Completed   HPV Vaccine  Aged Out   Colon Cancer Screening  Discontinued    Advanced directives: (Declined) Advance directive discussed with you today. Even though you declined this today, please call our office should you change your mind, and we can give you the proper paperwork for you to fill out.  Next Medicare Annual Wellness Visit scheduled for next year: Yes 08/06/24 @ 10:10am televisit

## 2023-08-15 ENCOUNTER — Other Ambulatory Visit: Payer: Self-pay | Admitting: Family Medicine

## 2023-08-23 DIAGNOSIS — L918 Other hypertrophic disorders of the skin: Secondary | ICD-10-CM | POA: Diagnosis not present

## 2023-08-23 DIAGNOSIS — D225 Melanocytic nevi of trunk: Secondary | ICD-10-CM | POA: Diagnosis not present

## 2023-08-23 DIAGNOSIS — D2262 Melanocytic nevi of left upper limb, including shoulder: Secondary | ICD-10-CM | POA: Diagnosis not present

## 2023-08-23 DIAGNOSIS — D2261 Melanocytic nevi of right upper limb, including shoulder: Secondary | ICD-10-CM | POA: Diagnosis not present

## 2023-08-23 DIAGNOSIS — Z85828 Personal history of other malignant neoplasm of skin: Secondary | ICD-10-CM | POA: Diagnosis not present

## 2023-08-23 DIAGNOSIS — L2089 Other atopic dermatitis: Secondary | ICD-10-CM | POA: Diagnosis not present

## 2023-08-25 ENCOUNTER — Other Ambulatory Visit: Payer: Self-pay | Admitting: Family Medicine

## 2023-08-25 DIAGNOSIS — R739 Hyperglycemia, unspecified: Secondary | ICD-10-CM

## 2023-08-25 DIAGNOSIS — I10 Essential (primary) hypertension: Secondary | ICD-10-CM

## 2023-08-25 DIAGNOSIS — E039 Hypothyroidism, unspecified: Secondary | ICD-10-CM

## 2023-08-25 DIAGNOSIS — I480 Paroxysmal atrial fibrillation: Secondary | ICD-10-CM

## 2023-08-25 DIAGNOSIS — M810 Age-related osteoporosis without current pathological fracture: Secondary | ICD-10-CM

## 2023-08-29 DIAGNOSIS — K1329 Other disturbances of oral epithelium, including tongue: Secondary | ICD-10-CM | POA: Diagnosis not present

## 2023-09-05 DIAGNOSIS — G4733 Obstructive sleep apnea (adult) (pediatric): Secondary | ICD-10-CM | POA: Diagnosis not present

## 2023-10-12 ENCOUNTER — Other Ambulatory Visit: Payer: Self-pay | Admitting: Family Medicine

## 2023-10-15 ENCOUNTER — Other Ambulatory Visit (INDEPENDENT_AMBULATORY_CARE_PROVIDER_SITE_OTHER): Payer: PPO

## 2023-10-15 DIAGNOSIS — E039 Hypothyroidism, unspecified: Secondary | ICD-10-CM

## 2023-10-15 DIAGNOSIS — R739 Hyperglycemia, unspecified: Secondary | ICD-10-CM | POA: Diagnosis not present

## 2023-10-15 DIAGNOSIS — I10 Essential (primary) hypertension: Secondary | ICD-10-CM | POA: Diagnosis not present

## 2023-10-15 DIAGNOSIS — M810 Age-related osteoporosis without current pathological fracture: Secondary | ICD-10-CM | POA: Diagnosis not present

## 2023-10-15 DIAGNOSIS — I480 Paroxysmal atrial fibrillation: Secondary | ICD-10-CM

## 2023-10-15 LAB — COMPREHENSIVE METABOLIC PANEL WITH GFR
ALT: 9 U/L (ref 0–35)
AST: 16 U/L (ref 0–37)
Albumin: 4 g/dL (ref 3.5–5.2)
Alkaline Phosphatase: 52 U/L (ref 39–117)
BUN: 7 mg/dL (ref 6–23)
CO2: 33 meq/L — ABNORMAL HIGH (ref 19–32)
Calcium: 9.2 mg/dL (ref 8.4–10.5)
Chloride: 101 meq/L (ref 96–112)
Creatinine, Ser: 0.76 mg/dL (ref 0.40–1.20)
GFR: 75.7 mL/min (ref >60.00)
Glucose, Bld: 100 mg/dL — ABNORMAL HIGH (ref 70–99)
Potassium: 4 meq/L (ref 3.5–5.1)
Sodium: 142 meq/L (ref 135–145)
Total Bilirubin: 0.8 mg/dL (ref 0.2–1.2)
Total Protein: 6.5 g/dL (ref 6.0–8.3)

## 2023-10-15 LAB — CBC WITH DIFFERENTIAL/PLATELET
Basophils Absolute: 0 10*3/uL (ref 0.0–0.1)
Basophils Relative: 0.7 % (ref 0.0–3.0)
Eosinophils Absolute: 0.1 10*3/uL (ref 0.0–0.7)
Eosinophils Relative: 1.9 % (ref 0.0–5.0)
HCT: 39.2 % (ref 36.0–46.0)
Hemoglobin: 12.3 g/dL (ref 12.0–15.0)
Lymphocytes Relative: 33.1 % (ref 12.0–46.0)
Lymphs Abs: 2.1 10*3/uL (ref 0.7–4.0)
MCHC: 31.3 g/dL (ref 30.0–36.0)
MCV: 74.3 fl — ABNORMAL LOW (ref 78.0–100.0)
Monocytes Absolute: 0.6 10*3/uL (ref 0.1–1.0)
Monocytes Relative: 8.8 % (ref 3.0–12.0)
Neutro Abs: 3.6 10*3/uL (ref 1.4–7.7)
Neutrophils Relative %: 55.5 % (ref 43.0–77.0)
Platelets: 179 10*3/uL (ref 150.0–400.0)
RBC: 5.28 Mil/uL — ABNORMAL HIGH (ref 3.87–5.11)
RDW: 16.6 % — ABNORMAL HIGH (ref 11.5–15.5)
WBC: 6.5 10*3/uL (ref 4.0–10.5)

## 2023-10-15 LAB — LIPID PANEL
Cholesterol: 116 mg/dL (ref 0–200)
HDL: 37.8 mg/dL — ABNORMAL LOW (ref >39.00)
LDL Cholesterol: 49 mg/dL (ref 0–99)
NonHDL: 78.38
Total CHOL/HDL Ratio: 3
Triglycerides: 148 mg/dL (ref 0.0–149.0)
VLDL: 29.6 mg/dL (ref 0.0–40.0)

## 2023-10-15 LAB — VITAMIN D 25 HYDROXY (VIT D DEFICIENCY, FRACTURES): VITD: 35.44 ng/mL (ref 30.00–100.00)

## 2023-10-15 LAB — TSH: TSH: 0.77 u[IU]/mL (ref 0.35–5.50)

## 2023-10-15 LAB — HEMOGLOBIN A1C: Hgb A1c MFr Bld: 5.8 % (ref 4.6–6.5)

## 2023-10-22 ENCOUNTER — Ambulatory Visit (INDEPENDENT_AMBULATORY_CARE_PROVIDER_SITE_OTHER): Payer: PPO | Admitting: Family Medicine

## 2023-10-22 ENCOUNTER — Encounter: Payer: Self-pay | Admitting: Family Medicine

## 2023-10-22 VITALS — BP 136/64 | HR 81 | Temp 97.9°F | Ht 64.8 in | Wt 222.2 lb

## 2023-10-22 DIAGNOSIS — M25559 Pain in unspecified hip: Secondary | ICD-10-CM | POA: Diagnosis not present

## 2023-10-22 DIAGNOSIS — Z0000 Encounter for general adult medical examination without abnormal findings: Secondary | ICD-10-CM

## 2023-10-22 DIAGNOSIS — M8580 Other specified disorders of bone density and structure, unspecified site: Secondary | ICD-10-CM | POA: Diagnosis not present

## 2023-10-22 DIAGNOSIS — Q383 Other congenital malformations of tongue: Secondary | ICD-10-CM

## 2023-10-22 DIAGNOSIS — I480 Paroxysmal atrial fibrillation: Secondary | ICD-10-CM | POA: Diagnosis not present

## 2023-10-22 DIAGNOSIS — E039 Hypothyroidism, unspecified: Secondary | ICD-10-CM

## 2023-10-22 DIAGNOSIS — E7849 Other hyperlipidemia: Secondary | ICD-10-CM

## 2023-10-22 DIAGNOSIS — Z659 Problem related to unspecified psychosocial circumstances: Secondary | ICD-10-CM | POA: Diagnosis not present

## 2023-10-22 DIAGNOSIS — Z7189 Other specified counseling: Secondary | ICD-10-CM

## 2023-10-22 MED ORDER — LEVOTHYROXINE SODIUM 150 MCG PO TABS: ORAL_TABLET | ORAL | 3 refills | Status: AC

## 2023-10-22 MED ORDER — DILTIAZEM HCL ER COATED BEADS 120 MG PO CP24: 120.0000 mg | ORAL_CAPSULE | Freq: Every day | ORAL | Status: DC

## 2023-10-22 MED ORDER — ROSUVASTATIN CALCIUM 5 MG PO TABS
5.0000 mg | ORAL_TABLET | Freq: Every day | ORAL | 3 refills | Status: AC
Start: 2023-10-22 — End: ?

## 2023-10-22 NOTE — Patient Instructions (Addendum)
 Please call about the follow up bone density test and mammogram.  Take care.  Glad to see you. Update me as needed.

## 2023-10-22 NOTE — Progress Notes (Unsigned)
 She has a whitish change on the R side of the tongue that is being followed by dental clinic.  Present since 04/2023.  Sensitive locally, to spicy foods.  No ulceration.  History of osteopenia, on fosamax  since 2023.  Routine med use d/w pt.  DXA prev done 2022, ordered 2025.    Hypothyroidism. TSH wnl.  Compliant.  Labs d/w pt.  No neck mass.     Elevated Cholesterol: Using medications without problems: yes Muscle aches: no Diet compliance: d/w pt.  Exercise: d/w pt.    Daughter and her family moved in with patient, since last year. D/w pt.  Nausea attributed to social stressors.  No vomiting.  No black or bloody stools.   Leg tingling. Noted with prolonged sitting at night.  Improved with position change.  See exam.  Discussed avoiding prolonged sitting and stretching her back.  She can update me as needed.   AF.  No spontaneous bleeding.  Still anticoagulated.  No CP.  Not SOB.     Taking tramadol  prn for hip pain at night.  No ADE on med.   Flu 2024 Shingles 2022 PNA previously done Tetanus 2022 RSV previously done.   COVID-vaccine previously done. Colonoscopy 2024 Breast cancer screening-due, she can call for mammogram. Bone density test ordered 2025.  Discussed.  She agrees. Advance directive-daughter Cortney designated if patient were incapacitated.  Meds, vitals, and allergies reviewed.   ROS: Per HPI unless specifically indicated in ROS section   GEN: nad, alert and oriented HEENT: mucous membranes moist, OP wnl except for small area with whitish change on the R side of the tongue without ulceration NECK: supple w/o LA CV: rrr.  PULM: ctab, no inc wob ABD: soft, +bs EXT: no edema SKIN: no acute rash SLR neg. strength and sensation grossly intact lower extremities.

## 2023-10-24 ENCOUNTER — Telehealth: Payer: Self-pay | Admitting: Family Medicine

## 2023-10-24 DIAGNOSIS — Q383 Other congenital malformations of tongue: Secondary | ICD-10-CM | POA: Insufficient documentation

## 2023-10-24 NOTE — Assessment & Plan Note (Signed)
 TSH wnl.  Compliant.  Labs d/w pt.  No neck mass.   Continue levothyroxine  as is.

## 2023-10-24 NOTE — Assessment & Plan Note (Signed)
 Daughter and her family moved in with patient, since last year. D/w pt.  Nausea attributed to social stressors.  No vomiting.  No black or bloody stools.  She can update me as needed.

## 2023-10-24 NOTE — Assessment & Plan Note (Signed)
 Continue work on diet.  Continue Crestor .
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
## 2023-10-24 NOTE — Assessment & Plan Note (Signed)
 No bleeding.  Would continue Xarelto .  Update me as needed.

## 2023-10-24 NOTE — Assessment & Plan Note (Signed)
 Flu 2024 Shingles 2022 PNA previously done Tetanus 2022 RSV previously done.   COVID-vaccine previously done. Colonoscopy 2024 Breast cancer screening-due, she can call for mammogram. Bone density test ordered 2025.  Discussed.  She agrees. Advance directive-daughter Cortney designated if patient were incapacitated.

## 2023-10-24 NOTE — Assessment & Plan Note (Signed)
 History of osteopenia, on fosamax  since 2023.  Routine med use d/w pt.  DXA prev done 2022, ordered 2025.

## 2023-10-24 NOTE — Assessment & Plan Note (Signed)
 She does not have routine follow-up with the dental clinic.  Routine cautions given to patient.  Update me as needed.

## 2023-10-24 NOTE — Telephone Encounter (Addendum)
 Please check with patient.  One issue we did not talk about was her rheumatology evaluation last year regarding lymphadenopathy.  Is she going to follow-up with rheumatology this year?  Please let me know.  Thanks.

## 2023-10-24 NOTE — Assessment & Plan Note (Signed)
 Taking tramadol  prn for hip pain at night.  No ADE on med.  Can continue as is.  Update me as needed.

## 2023-10-25 NOTE — Telephone Encounter (Signed)
 Left voicemail for patient to return call to office.

## 2023-10-29 NOTE — Telephone Encounter (Signed)
 Left message for patient to return call.

## 2023-10-30 ENCOUNTER — Other Ambulatory Visit: Payer: Self-pay | Admitting: Family Medicine

## 2023-10-30 DIAGNOSIS — Z1231 Encounter for screening mammogram for malignant neoplasm of breast: Secondary | ICD-10-CM

## 2023-10-30 NOTE — Telephone Encounter (Signed)
 Patient called back and stated she canceled her appt with rheumatologist she didn't think she needed to go back she would like a call back

## 2023-10-30 NOTE — Telephone Encounter (Signed)
 Patient states that she canceled the appointment with rheumatology. She is scheduled for her bone density and mammogram though.

## 2023-10-31 NOTE — Telephone Encounter (Signed)
 Noted. Thanks.

## 2023-11-13 ENCOUNTER — Encounter: Payer: Self-pay | Admitting: Internal Medicine

## 2023-11-13 ENCOUNTER — Other Ambulatory Visit: Payer: Self-pay | Admitting: Family Medicine

## 2023-11-13 ENCOUNTER — Ambulatory Visit: Payer: PPO | Admitting: Internal Medicine

## 2023-11-13 VITALS — BP 120/80 | HR 78 | Temp 98.1°F | Ht 64.5 in | Wt 222.6 lb

## 2023-11-13 DIAGNOSIS — G4733 Obstructive sleep apnea (adult) (pediatric): Secondary | ICD-10-CM

## 2023-11-13 DIAGNOSIS — J4520 Mild intermittent asthma, uncomplicated: Secondary | ICD-10-CM | POA: Diagnosis not present

## 2023-11-13 DIAGNOSIS — R918 Other nonspecific abnormal finding of lung field: Secondary | ICD-10-CM | POA: Diagnosis not present

## 2023-11-13 NOTE — Progress Notes (Signed)
 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 Patient recently had follow up CT imaging that revealed stable nodule in the LUL and new nodule in the RLL.  In addition the patient has a past 8 to 28-month history of dyspnea on exertion.  She states that she gets "short winded" when she is climbs a hill or climb stairs.  When asked about her sleep her husband pipes up that she does snore at times.  She is not sure if she thinks her sleep is restful or not.  Patient does not wheeze.  She occasionally has random left-sided sharp pains in the chest.  This is been going on for some time.  She follows with cardiology regarding her atrial fibrillation.  Review of her CT imaging today in the office also reveals enlarged pulmonary arteries.  This was discussed with the patient.  OV 12/18/2018: Patient seen here today for follow-up regarding multiple pulmonary nodules.  Patient had CT imaging of the chest completed on 12/13/2018 for follow-up regarding her nodules.  There is a new small 5 mm nodule in the right pulmonary apex a new 4 mm nodule in the anterior left upper lobe prior nodules either resolved or stable. Today, she states that she has breathing better.  She does have some dyspnea on exertion.  She was unable to get her sleep appointment scheduled.  We will try again for this.  She was very anxious about having the CT scan for the follow-up of the nodules.  She is relieved to hear that they are smaller.  She does understand there is a few new ones that need follow-up.  Otherwise doing well.  She had PFTs completed prior to today's office which which we reviewed.  It does reveal a reduced FEV1 and FVC with a reserved ratio and a reduced ERV of 10%.  DLCO of 74%.  This is likely related to her weight.  BMI of 40.  We discussed this today in the office as well.  She is trying to watch her weight and her dietary intake.  She does  understand importance of losing weight.  OV 07/22/2020: Patient here today for follow-up regarding recent CT scan of the chest for follow-up of lung nodules.  Patient had a CT scan of the chest on 07/12/2020 which revealed persistent waxing and waning bilateral lung nodules.  The left lower lobe lung nodule 1 cm in size seems to be the largest left the other upper lobe nodules that was seen previously are no longer present have subsided she also has some small adenopathy within the chest.  She does have enlarged pulmonary arteries consistent with possible underlying PAH.  From respiratory standpoint she is stable.  She does not have cough sputum production night sweats weight loss fevers.    OV 06/22/2022: Here today for follow-up.  She had a CT scan in August which showed stability of her pulmonary nodules.  She has no other significant symptoms at this time.  She fell this morning and out the back door when she was trying to move a dog water dish.  She states she is doing okay from that.  She has been working on her weight.  We talked about various weight loss options today in the office.    CC Follow-up assessment for OSA follow-up Assessment for pulmonary nodules Assessment for reactive airways disease   HPI Patient uses and benefits from therapy Using CPAP nightly  and with naps Pressure setting is comfortable and is sleeping well. Previous HST shows AHI of 38 Compliance report reviewed in detail with patient Excellent compliance 100% Patient use and benefits from therapy Patient is having a hard time with the mask Recommend referral to [facility] for mask assessment  CT chest shows a history of pulmonary nodules Most recent CT chest December 2024 shows resolution and stability of pulmonary nodules I do not recommend any more follow-up CT scans  No exacerbation at this time No evidence of heart failure at this time No evidence or signs of infection at this time No respiratory  distress No fevers, chills, nausea, vomiting, diarrhea No evidence of lower extremity edema No evidence hemoptysis  Patient with A-fib on oral anticoagulation Follow-up with cardiology  Patient currently on biological agents Dupixent Patient diagnosed with contact dermatitis         Past Medical History:  Diagnosis Date  . Atrial fib/flutter, transient (HCC)   . Cataract 2019   left eye  . Clotting disorder (HCC)    on xarelto   . Diverticulosis 03/1997  . Dyspnea   . Dysrhythmia    atrial fibrilation  . GERD (gastroesophageal reflux disease) 1997   good on prilosec  . Hepatitis    hx of hep B (late 1970s)  . Hyperlipidemia 08/1996  . Hypothyroidism 1984  . Migraine    w/o aura  . Osteopenia 11/2002   via dexa  . Skin cancer    skin cancer  . Sleep apnea   . Stroke [facility]) 08/2011   TIA     Family History  Problem Relation Age of Onset  . Hypertension Mother   . Osteopenia Mother   . Stroke Mother        deceased age 67  . Colon cancer Father 60       mets  . Allergies Sister   . Breast cancer Cousin        pat cousin  . Colon cancer Nephew   . Rectal cancer Neg Hx      Past Surgical History:  Procedure Laterality Date  . ABDOMINAL HYSTERECTOMY    . ANAL FISSURE REPAIR    . CATARACT EXTRACTION W/PHACO Right 02/06/2018   Procedure: CATARACT EXTRACTION PHACO AND INTRAOCULAR LENS PLACEMENT (IOC) RIGHT;  Surgeon: Annell Kidney, MD;  Location: [facility];  Service: Ophthalmology;  Laterality: Right;  . CHOLECYSTECTOMY  07/2006  . LYMPH NODE BIOPSY Right 03/16/2021   Procedure: LYMPH NODE BIOPSY;  Surgeon: Jackquelyn Mass, MD;  Location: [facility];  Service: Vascular;  Laterality: Right;  . 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 nasolabial fold skin flap surgery  08/02/2007   [facility]  . PARTIAL HYSTERECTOMY  1978-79  . POLYPECTOMY  04/06/97   colon, benign by pathology  . varicose vein ablation  sclerotherapy  09/2008   Dr. Jerre Moots    Social History   Socioeconomic History  . Marital status: Widowed    Spouse name: Not on file  . Number of children: 2  . Years of education: Not on file  . Highest education level: Not on file  Occupational History  . Occupation: Chartered certified accountant: RETIRED    Comment: [employer]  . Occupation: Building control surveyor: [employer]  Tobacco Use  . Smoking status: Former    Current packs/day: 0.00    Types: Cigarettes    Quit date:  06/26/1986    Years since quitting: 37.4    Passive exposure: Past  . Smokeless tobacco: Never  Vaping Use  . Vaping status: Never Used  Substance and Sexual Activity  . Alcohol use: No  . Drug use: No  . Sexual activity: Not Currently  Other Topics Concern  . Not on file  Social History Narrative   Worked part time at 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     Married 1979---widowed 10/22   2 daughters from prev relationship   [employer] fan, [employer] fan.   Social Drivers of Corporate investment banker Strain: Low Risk  (08/06/2023)   Overall Financial Resource Strain (CARDIA)   . Difficulty of Paying Living Expenses: Not hard at all  Food Insecurity: No Food Insecurity (08/06/2023)   Hunger Vital Sign   . Worried About Programme researcher, broadcasting/film/video in the Last Year: Never true   . Ran Out of Food in the Last Year: Never true  Transportation Needs: No Transportation Needs (08/06/2023)   PRAPARE - Transportation   . Lack of Transportation (Medical): No   . Lack of Transportation (Non-Medical): No  Physical Activity: Insufficiently Active (08/06/2023)   Exercise Vital Sign   . Days of Exercise per Week: 3 days   . Minutes of Exercise per Session: 30 min  Stress: No Stress Concern Present (08/06/2023)   [employer] of Occupational Health - Occupational Stress Questionnaire   . Feeling of Stress : Only a little  Social Connections: Moderately Isolated (08/06/2023)    Social Connection and Isolation Panel [NHANES]   . Frequency of Communication with Friends and Family: More than three times a week   . Frequency of Social Gatherings with Friends and Family: More than three times a week   . Attends Religious Services: More than 4 times per year   . Active Member of Clubs or Organizations: No   . Attends Banker Meetings: Never   . Marital Status: Widowed  Intimate Partner Violence: Not At Risk (08/06/2023)   Humiliation, Afraid, Rape, and Kick questionnaire   . Fear of Current or Ex-Partner: No   . Emotionally Abused: No   . Physically Abused: No   . Sexually Abused: No     Allergies  Allergen Reactions  . Aurothioglucose Itching    Other reaction(s): Other (See Comments)  . Celebrex [Celecoxib]     REACTION: nausea: GI UPSET  . Doxycycline  Other (See Comments)    GI upset- not an allergy .    . Gold-Containing Drug Products   . Nickel Dermatitis    Skin rash, allergy   . Other     Fragrance, phenylenediamine, contrast metal agents, gold  . Simvastatin  Anxiety    Aches but tolerates crestor  Other reaction(s): Other (See Comments) Aches but tolerates crestor  Aches but tolerates crestor   . Sodium Metabisulfite Nausea Only, Other (See Comments), Rash and Dermatitis    Positive patch test  Other reaction(s): Other (See Comments)     Outpatient Medications Prior to Visit  Medication Sig Dispense Refill  . albuterol  (VENTOLIN  HFA) 108 (90 Base) MCG/ACT inhaler Inhale 2 puffs into the lungs every 6 (six) hours as needed for wheezing or shortness of breath. 8 g 3  . alendronate  (FOSAMAX ) 70 MG tablet TAKE ONE TABLET BY MOUTH IN THE EARLY MORNING ON MONDAY TAKE WITH A FULL GLASS OF WATER ON AN EMPTY STOMACH. 3 tablet 10  . Cholecalciferol (D3 SUPER STRENGTH) 50 MCG (2000 UT) CAPS  Take 2 capsules (4,000 Units total) by mouth daily. 180 capsule 2  . diltiazem  (CARDIZEM  CD) 120 MG 24 hr capsule Take 1 capsule (120 mg total) by mouth daily.     . diltiazem  (CARDIZEM ) 30 MG tablet TAKE ONE TABLET BY MOUTH THREE TIMES DAILY AS NEEDED 90 tablet 1  . Dupilumab (DUPIXENT) 300 MG/2ML SOPN Inject 300 mg into the skin every 14 (fourteen) days.    . levothyroxine  (SYNTHROID ) 150 MCG tablet TAKE 1 TABLET BY MOUTH ON MONDAY THROUGH SATURDAY AND TAKE 2 TABLETS ONCE WEEKLY ON SUNDAY 105 tablet 3  . loperamide (IMODIUM A-D) 2 MG tablet Take 2 mg by mouth 2 (two) times daily as needed for diarrhea or loose stools.    . loratadine  (CLARITIN ) 10 MG tablet Take 1 tablet (10 mg total) by mouth daily.    . omeprazole  (PRILOSEC) 20 MG capsule Take 1 capsule (20 mg total) by mouth daily. 90 capsule 2  . promethazine  (PHENERGAN ) 25 MG tablet TAKE ONE TABLET BY MOUTH EVERY 6 HOURS AS NEEDED FOR NAUSEA 30 tablet 3  . propranolol  ER (INDERAL  LA) 120 MG 24 hr capsule Take 1 capsule (120 mg total) by mouth at bedtime. 90 capsule 2  . rivaroxaban  (XARELTO ) 20 MG TABS tablet TAKE 1 TABLET BY MOUTH IN THE EVENING WITH DINNER 30 tablet 2  . rosuvastatin  (CRESTOR ) 5 MG tablet Take 1 tablet (5 mg total) by mouth at bedtime. 90 tablet 3  . traMADol  (ULTRAM ) 50 MG tablet TAKE 1 TABLET BY MOUTH EVERY 12 HOURS AS NEEDED 60 tablet 2   No facility-administered medications prior to visit.    Vitals:   11/13/23 1333  BP: 120/80  Pulse: 78  Temp: 98.1 F (36.7 C)  TempSrc: Oral  SpO2: 96%  Weight: 222 lb 9.6 oz (101 kg)  Height: 5' 4.5" (1.638 m)    96% on RA BMI Readings from Last 3 Encounters:  11/13/23 37.62 kg/m  10/22/23 37.20 kg/m  08/06/23 37.69 kg/m   Wt Readings from Last 3 Encounters:  11/13/23 222 lb 9.6 oz (101 kg)  10/22/23 222 lb 3.2 oz (100.8 kg)  08/06/23 223 lb (101.2 kg)   BP 120/80 (BP Location: Left Arm, Patient Position: Sitting, Cuff Size: Large)   Pulse 78   Temp 98.1 F (36.7 C) (Oral)   Ht 5' 4.5" (1.638 m)   Wt 222 lb 9.6 oz (101 kg)   SpO2 96%   BMI 37.62 kg/m      Review of Systems: Gen:  Denies  fever, sweats,  chills weight loss  HEENT: Denies blurred vision, double vision, ear pain, eye pain, hearing loss, nose bleeds, sore throat Cardiac:  No dizziness, chest pain or heaviness, chest tightness,edema, No JVD Resp:   No cough, -sputum production, -shortness of breath,-wheezing, -hemoptysis,  Other:  All other systems negative   Physical Examination:   General Appearance: No distress  EYES PERRLA, EOM intact.   NECK Supple, No JVD Pulmonary: normal breath sounds, No wheezing.  CardiovascularNormal S1,S2.  No m/r/g.   Abdomen: Benign, Soft, non-tender. Neurology UE/LE 5/5 strength, no focal deficits Ext pulses intact, cap refill intact ALL OTHER ROS ARE NEGATIVE  CBC    Component Value Date/Time   WBC 6.5 10/15/2023 1013   RBC 5.28 (H) 10/15/2023 1013   HGB 12.3 10/15/2023 1013   HGB 13.8 08/26/2011 1334   HCT 39.2 10/15/2023 1013   HCT 41.2 08/26/2011 1334   PLT 179.0 10/15/2023 1013   PLT 156  08/26/2011 1334   MCV 74.3 (L) 10/15/2023 1013   MCV 83 08/26/2011 1334   MCH 25.5 (L) 04/07/2021 1231   MCHC 31.3 10/15/2023 1013   RDW 16.6 (H) 10/15/2023 1013   RDW 13.8 08/26/2011 1334   LYMPHSABS 2.1 10/15/2023 1013   MONOABS 0.6 10/15/2023 1013   EOSABS 0.1 10/15/2023 1013   BASOSABS 0.0 10/15/2023 1013    Chest Imaging: 07/29/2018 CTA chest 10 mm right lower lobe pulmonary nodule, 5 mm right upper lobe pulmonary nodule, left upper lobe pulmonary nodule The patient's images have been independently reviewed by me.    June 2020 CT chest: Resolved pulmonary nodule as compared to previous.  New upper lobe pulmonary nodule.  Recommending 10-month follow-up. The patient's images have been independently reviewed by me.    CT scan of the chest January 2022: Bilateral waxing and waning pulmonary nodules largest left lower lobe approximately 1 cm in size. The patient's images have been independently reviewed by me.    August 2023 CT: Stable lung nodules \  CT chest December  2024-independently reviewed by me today Nodules seen on prior study have improved or resolved. Largest residual nodule is 6 mm in the left upper lobe compared to 7 mm previously. This nodule is stable dating back to 01/10/2021 compatible with benign nodule.  No acute cardiopulmonary disease.     Pulmonary Functions Testing Results:    Latest Ref Rng & Units 12/18/2018    1:49 PM  PFT Results  FVC-Pre L 1.78   FVC-Predicted Pre % 59   FVC-Post L 1.81   FVC-Predicted Post % 60   Pre FEV1/FVC % % 77   Post FEV1/FCV % % 75   FEV1-Pre L 1.37   FEV1-Predicted Pre % 61   FEV1-Post L 1.36   DLCO uncorrected ml/min/mmHg 14.66   DLCO [facility]% % 74   DLVA Predicted % 98   TLC L 4.26   TLC % Predicted % 83   RV % Predicted % 106     FeNO: None   Pathology: None   Echocardiogram:  10/27/2016: Normal ejection fraction 60 to 65%, mild LVH. 08/16/2018: 1. The left ventricle has normal systolic function, with an ejection fraction of 55-60%. The cavity size was normal. Left ventricular diastolic parameters were normal.  2. The right ventricle has normal systolic function. The cavity was normal. There is no increase in right ventricular wall thickness.  3. Left atrial size was moderately dilated.  4. Right atrial size was mildly dilated.  5. The mitral valve is degenerative. Moderate thickening of the mitral valve leaflet. Mild calcification of the mitral valve leaflet.  6. The tricuspid valve is normal in structure.  7. The aortic valve is tricuspid Moderate thickening of the aortic valve Moderate sclerosis of the aortic valve.  Heart Catheterization: None     Assessment & Plan:   78 year old morbidly obese white female with underlying diagnosis of sleep apnea with incidental finding of multiple pulmonary nodules that have been waxing and waning over the last several years, with underlying reactive airways disease contact dermatitis and allergic rhinitis   Assessment of OSA Initial HST  shows severe OSA with AHI of 38 Continue CPAP as prescribed  Excellent compliance report Reviewed compliance report in detail with patient Patient definitely benefits the use of CPAP therapy as prescribed Using CPAP nightly and with naps Pressure setting is comfortable and is sleeping well. CPAP prescription 5-18 AHI reduced to 2  No evidence of acute heart failure at  this time No respiratory distress No fevers, chills, nausea, vomiting, diarrhea No evidence hemoptysis  Patient Instructions Continue to use CPAP every night, minimum of 4-6 hours a night.  Change equipment every 30 days or as directed by DME.  Wash your tubing with warm soap and water daily, hang to dry. Wash humidifier portion weekly. Use bottled, distilled water and change daily   Be aware of reduced alertness and do not drive or operate heavy machinery if experiencing this or drowsiness.  Exercise encouraged, as tolerated. Encouraged proper weight management.  Important to get eight or more hours of sleep  Limiting the use of the computer and television before bedtime.  Decrease naps during the day, so night time sleep will become enhanced.  Limit caffeine, and sleep deprivation.  HTN, stroke, uncontrolled diabetes and heart failure are potential risk factors.  Risk of untreated sleep apnea including cardiac arrhthymias, stroke, DM, pulm HTN.   Recommend a mask fitting desensitization referral program  Assessment pulmonary nodules Patient has waxing and waning pulmonary nodules which are considered to be benign given the fact that they have been stable over the last 4 years based on CT scan findings Most recent CT chest shows resolution of nodules At this time I do not recommend follow-up CT scans unless patient has symptoms or feels uncomfortable and wants to assess pulmonary nodules No indication for CT scans at this time  Obesity -recommend significant weight loss -recommend changing diet  Deconditioned  state -Recommend increased daily activity and exercise   Underlying reactive airways disease related to obesity Use albuterol  as needed No indication for antibiotics or prednisone  at this time Avoid Allergens and Irritants Avoid secondhand smoke Avoid SICK contacts Recommend  Masking  when appropriate Recommend Keep up-to-date with vaccinations  Atrial Fibrillation - Sleep apnea can contribute to Atrial Fibrillation, therefore treatment of sleep apnea is important part of A fib management.     Current Outpatient Medications:  .  albuterol  (VENTOLIN  HFA) 108 (90 Base) MCG/ACT inhaler, Inhale 2 puffs into the lungs every 6 (six) hours as needed for wheezing or shortness of breath., Disp: 8 g, Rfl: 3 .  alendronate  (FOSAMAX ) 70 MG tablet, TAKE ONE TABLET BY MOUTH IN THE EARLY MORNING ON MONDAY TAKE WITH A FULL GLASS OF WATER ON AN EMPTY STOMACH., Disp: 3 tablet, Rfl: 10 .  Cholecalciferol (D3 SUPER STRENGTH) 50 MCG (2000 UT) CAPS, Take 2 capsules (4,000 Units total) by mouth daily., Disp: 180 capsule, Rfl: 2 .  diltiazem  (CARDIZEM  CD) 120 MG 24 hr capsule, Take 1 capsule (120 mg total) by mouth daily., Disp: , Rfl:  .  diltiazem  (CARDIZEM ) 30 MG tablet, TAKE ONE TABLET BY MOUTH THREE TIMES DAILY AS NEEDED, Disp: 90 tablet, Rfl: 1 .  Dupilumab (DUPIXENT) 300 MG/2ML SOPN, Inject 300 mg into the skin every 14 (fourteen) days., Disp: , Rfl:  .  levothyroxine  (SYNTHROID ) 150 MCG tablet, TAKE 1 TABLET BY MOUTH ON MONDAY THROUGH SATURDAY AND TAKE 2 TABLETS ONCE WEEKLY ON SUNDAY, Disp: 105 tablet, Rfl: 3 .  loperamide (IMODIUM A-D) 2 MG tablet, Take 2 mg by mouth 2 (two) times daily as needed for diarrhea or loose stools., Disp: , Rfl:  .  loratadine  (CLARITIN ) 10 MG tablet, Take 1 tablet (10 mg total) by mouth daily., Disp: , Rfl:  .  omeprazole  (PRILOSEC) 20 MG capsule, Take 1 capsule (20 mg total) by mouth daily., Disp: 90 capsule, Rfl: 2 .  promethazine  (PHENERGAN ) 25 MG tablet, TAKE ONE  TABLET BY MOUTH EVERY 6 HOURS AS NEEDED FOR NAUSEA, Disp: 30 tablet, Rfl: 3 .  propranolol  ER (INDERAL  LA) 120 MG 24 hr capsule, Take 1 capsule (120 mg total) by mouth at bedtime., Disp: 90 capsule, Rfl: 2 .  rivaroxaban  (XARELTO ) 20 MG TABS tablet, TAKE 1 TABLET BY MOUTH IN THE EVENING WITH DINNER, Disp: 30 tablet, Rfl: 2 .  rosuvastatin  (CRESTOR ) 5 MG tablet, Take 1 tablet (5 mg total) by mouth at bedtime., Disp: 90 tablet, Rfl: 3 .  traMADol  (ULTRAM ) 50 MG tablet, TAKE 1 TABLET BY MOUTH EVERY 12 HOURS AS NEEDED, Disp: 60 tablet, Rfl: 2     MEDICATION ADJUSTMENTS/LABS AND TESTS ORDERED: Referral mask fitting services Continue CPAP as prescribed Avoid Allergens and Irritants Avoid secondhand smoke Avoid SICK contacts Recommend  Masking  when appropriate Recommend Keep up-to-date with vaccinations Recommend weight loss Follow-up with cardiology Follow-up with dermatology Albuterol  as needed   CURRENT MEDICATIONS REVIEWED AT LENGTH WITH PATIENT TODAY   Patient  satisfied with Plan of action and management. All questions answered   Follow-up in 6 months   I spent a total of 48 minutes reviewing chart data, face-to-face evaluation with the patient, counseling and coordination of care as detailed above.      Lady Pier, M.D.  [facility]  Medical Director [facility] Medical Director [facility] Cardio-Pulmonary Department

## 2023-11-13 NOTE — Patient Instructions (Signed)
 Excellent Job A+ GOLD STAR!!  Recommend referral to [facility] for mask fitting desensitization and assessment  Continue CPAP as prescribed  Patient Instructions Continue to use CPAP every night, minimum of 4-6 hours a night.  Change equipment every 30 days or as directed by DME.  Wash your tubing with warm soap and water daily, hang to dry. Wash humidifier portion weekly. Use bottled, distilled water and change daily   Be aware of reduced alertness and do not drive or operate heavy machinery if experiencing this or drowsiness.  Exercise encouraged, as tolerated. Encouraged proper weight management.  Important to get eight or more hours of sleep  Limiting the use of the computer and television before bedtime.  Decrease naps during the day, so night time sleep will become enhanced.  Limit caffeine, and sleep deprivation.    Avoid Allergens and Irritants Avoid secondhand smoke Avoid SICK contacts Recommend  Masking  when appropriate Recommend Keep up-to-date with vaccinations  Use albuterol  as needed Follow-up with dermatologist Follow-up with cardiologist

## 2023-11-15 NOTE — Progress Notes (Unsigned)
 Cardiology Clinic Note   Date: 11/16/2023 ID: Kara Wright, Kara Wright 17-Apr-1946, MRN [id number]  Primary Cardiologist:  Belva Boyden, MD  Chief Complaint   Kara Wright is a 78 y.o. female who presents to the clinic today for routine follow up.   Patient Profile   Kara Wright is followed by Dr. Gollan for the history outlined below.      Past medical history significant for: Coronary calcification. Nuclear stress test 11/16/2016: Lateral ST segment deviation noted during stress.  LVEF 55 to 65%.  No evidence of perfusion defects noted with stress or rest images.  Transient ischemic dilatation is noted with TID ratio of 1.3.  This is occasionally associated with balanced ischemia due to three-vessel CAD. PAF. Echo 08/16/2018: EF 55 to 60%.  Normal diastolic parameters.  Normal RV size/function.  Moderate LAE.  Mild RAE.  Mild calcification/moderate thickening of mitral valve leaflet.  Moderate thickening/sclerosis of aortic valve. Hypertension. Hyperlipidemia. Lipid panel 10/15/2023: LDL 49, HDL 38, TG 148, total 116. TIA. OSA. GERD. Hypothyroidism.  In summary, patient has a history of PAF with cardioversion in 2012.  Echo at that time demonstrated EF 55 to 65%, no RWMA, normal diastolic parameters, normal PA pressure.  Patient had a TIA in March 2013.  Carotid ultrasound at that time showed no evidence of stenosis bilateral ICA.  Echo showed normal LV/RV function with no significant valvular abnormalities.  Patient underwent hospital admission in May 2018 for acute hypoxemic respiratory failure secondary to pneumonia.  Echo at that time showed EF 60 to 65%, normal diastolic parameters, mild LVH, mild MR, mild RVH.  LPa 2018 Dr. Rolm Clos again contacted Dr. Gollan or related to incidental finding of coronary calcifications on chest CT.  Patient was not symptomatic with chest pain.  It was suggested she undergo nuclear stress testing as detailed above.  Patient was last seen in  the office by Dr. Gollan on 03/28/2023 for preop evaluation prior to colonoscopy.  She reported being under some stress at home.  It was recommended she start diltiazem  120 mg in the morning and continue propranolol  in the evening.  No other changes were made.     History of Present Illness    Today, patient reports she is doing well. Patient denies shortness of breath at rest, lower extremity edema, orthopnea or PND. She gets mild dyspnea with heavier exertion or walking extended distances. This is not new and is unchanged from previous. No chest pain, pressure, or tightness. She is very surprised to hear she is in afib today stating "I know I as in afib earlier this week." She reports when in afib she feels a heaviness in her chest "like indigestion" and her heart pounds. She wonders if stress can cause afib, as she is under an increased amount of stress with her daughter and grandson living in her home. She reports she did not use CPAP last night because it was making a funny sound but she normally uses it daily. She reports sometimes missing dose of Xarelto . She is not sure when the last time she missed a dose was but believes it was this week sometime.     ROS: All other systems reviewed and are otherwise negative except as noted in History of Present Illness.  EKGs/Labs Reviewed    EKG Interpretation Date/Time:  Friday Nov 16 2023 14:29:52 EDT Ventricular Rate:  85 PR Interval:    QRS Duration:  84 QT Interval:  376 QTC Calculation: 447 R Axis:  84  Text Interpretation: Atrial fibrillation with a competing junctional pacemaker When compared with ECG of 28-Mar-2023 15:32, Atrial fibrillation has replaced Sinus rhythm Confirmed by Morey Ar [phone]) on 11/16/2023 2:43:44 PM   10/15/2023: ALT 9; AST 16; BUN 7; Creatinine, Ser 0.76; Potassium 4.0; Sodium 142   10/15/2023: Hemoglobin 12.3; WBC 6.5   10/15/2023: TSH 0.77    Risk Assessment/Calculations     CHA2DS2-VASc Score = 6    This indicates a 9.7% annual risk of stroke. The patient's score is based upon: CHF History: 0 HTN History: 1 Diabetes History: 0 Stroke History: 2 Vascular Disease History: 0 Age Score: 2 Gender Score: 1             Physical Exam    VS:  BP 126/68 (BP Location: Left Wrist, Patient Position: Sitting, Cuff Size: Normal)   Pulse 85   Ht 5\' 4"  (1.626 m)   Wt 223 lb 9.6 oz (101.4 kg)   SpO2 97%   BMI 38.38 kg/m  , BMI Body mass index is 38.38 kg/m.  GEN: Well nourished, well developed, in no acute distress. Neck: No JVD or carotid bruits. Cardiac: Irregularly irregular rhythm. No murmurs. No rubs or gallops.   Respiratory:  Respirations regular and unlabored. Clear to auscultation without rales, wheezing or rhonchi. GI: Soft, nontender, nondistended. Extremities: Radials/DP/PT 2+ and equal bilaterally. No clubbing or cyanosis. No edema.  Skin: Warm and dry, no rash. Neuro: Strength intact.  Assessment & Plan   Coronary calcification Nuclear stress test May 2018 with no evidence of perfusion defects noted with stress or rest images (further details in patient profile).  Patient denies chest pain, pressure or tightness.  - Continue rosuvastatin , propranolol .  Not on aspirin  secondary to Xarelto .  PAF Echo February 2020 showed EF 55 to 60%, normal RV size/function, normal diastolic parameters, moderate LAE, mild RAE, calcification/thickening of the mitral valve leaflet, thickening/sclerosis of aortic valve.  Denies spontaneous bleeding concerns.  Patient reports when in afib she feels a heaviness in her chest "like indigestion" and her heart pounds. EKG today shows afib HR 85 bpm. She is shocked she is in afib today, as she is having no symptoms. She reports an episode of afib earlier in the week. She reports being under a lot of stress. She did not use her CPAP last night because it was making a strange noise but she is normally compliant. She reports missing Xarelto  sometimes  but cannot quantify how often. She believes she missed a dose this week. Discussed returning in 3 weeks to repeat EKG and scheduling cardioversion if in persistent afib. She would prefer to not do that. She agrees to wearing a Zio monitor to determine afib burden. Stressed importance of not missing doses of Xarelto .  - 7 day Zio.  - Continue diltiazem , propranolol , as needed immediate release diltiazem , Xarelto .  Hypertension BP today 126/68. No headaches or dizziness reported.  - Continue propranolol , diltiazem .  Hyperlipidemia LDL 49 April 2025, at goal. - Continue rosuvastatin .  Disposition: 7 day Zio. She wants to follow up in 6 months. She will come in sooner if the heart monitor shows anything concerning.          Signed, Lonell Rives. Mattilyn Crites, DNP, NP-C

## 2023-11-16 ENCOUNTER — Ambulatory Visit

## 2023-11-16 ENCOUNTER — Ambulatory Visit: Attending: Student | Admitting: Student

## 2023-11-16 ENCOUNTER — Encounter: Payer: Self-pay | Admitting: Student

## 2023-11-16 VITALS — BP 126/68 | HR 85 | Ht 64.0 in | Wt 223.6 lb

## 2023-11-16 DIAGNOSIS — I2510 Atherosclerotic heart disease of native coronary artery without angina pectoris: Secondary | ICD-10-CM

## 2023-11-16 DIAGNOSIS — I480 Paroxysmal atrial fibrillation: Secondary | ICD-10-CM | POA: Diagnosis not present

## 2023-11-16 DIAGNOSIS — E7800 Pure hypercholesterolemia, unspecified: Secondary | ICD-10-CM | POA: Diagnosis not present

## 2023-11-16 DIAGNOSIS — I10 Essential (primary) hypertension: Secondary | ICD-10-CM | POA: Diagnosis not present

## 2023-11-16 NOTE — Patient Instructions (Signed)
 Medication Instructions:  Your Physician recommend you continue on your current medication as directed.    *If you need a refill on your cardiac medications before your next appointment, please call your pharmacy*  Lab Work: None ordered at this time  If you have labs (blood work) drawn today and your tests are completely normal, you will receive your results only by: MyChart Message (if you have MyChart) OR A paper copy in the mail If you have any lab test that is abnormal or we need to change your treatment, we will call you to review the results.  Testing/Procedures: Your physician has recommended that you wear a Zio monitor.   This monitor is a medical device that records the heart's electrical activity. Doctors most often use these monitors to diagnose arrhythmias. Arrhythmias are problems with the speed or rhythm of the heartbeat. The monitor is a small device applied to your chest. You can wear one while you do your normal daily activities. While wearing this monitor if you have any symptoms to push the button and record what you felt. Once you have worn this monitor for the period of time provider prescribed (7 days), you will return the monitor device in the postage paid box. Once it is returned they will download the data collected and provide us  with a report which the provider will then review and we will call you with those results. Important tips:  Avoid showering during the first 24 hours of wearing the monitor. Avoid excessive sweating to help maximize wear time. Do not submerge the device, no hot tubs, and no swimming pools. Keep any lotions or oils away from the patch. After 24 hours you may shower with the patch on. Take brief showers with your back facing the shower head.  Do not remove patch once it has been placed because that will interrupt data and decrease adhesive wear time. Push the button when you have any symptoms and write down what you were feeling. Once you have  completed wearing your monitor, remove and place into box which has postage paid and place in your outgoing mailbox.  If for some reason you have misplaced your box then call our office and we can provide another box and/or mail it off for you.   Follow-Up: At [facility], you and your health needs are our priority.  As part of our continuing mission to provide you with exceptional heart care, our providers are all part of one team.  This team includes your primary Cardiologist (physician) and Advanced Practice Providers or APPs (Physician Assistants and Nurse Practitioners) who all work together to provide you with the care you need, when you need it.  Your next appointment:   6 month(s)  Provider:   You may see Timothy Gollan, MD or one of the following Advanced Practice Providers on your designated Care Team:   Laneta Pintos, NP Gildardo Labrador, PA-C Varney Gentleman, PA-C Cadence Stafford, PA-C Ronald Cockayne, NP Morey Ar, NP    We recommend signing up for the patient portal called "MyChart".  Sign up information is provided on this After Visit Summary.  MyChart is used to connect with patients for Virtual Visits (Telemedicine).  Patients are able to view lab/test results, encounter notes, upcoming appointments, etc.  Non-urgent messages can be sent to your provider as well.   To learn more about what you can do with MyChart, go to [email or web address].

## 2023-12-05 DIAGNOSIS — I480 Paroxysmal atrial fibrillation: Secondary | ICD-10-CM | POA: Diagnosis not present

## 2023-12-11 ENCOUNTER — Inpatient Hospital Stay: Admission: RE | Admit: 2023-12-11 | Source: Ambulatory Visit

## 2023-12-11 ENCOUNTER — Other Ambulatory Visit

## 2023-12-23 ENCOUNTER — Ambulatory Visit: Payer: Self-pay | Admitting: Student

## 2023-12-23 DIAGNOSIS — I480 Paroxysmal atrial fibrillation: Secondary | ICD-10-CM

## 2023-12-28 ENCOUNTER — Encounter: Payer: Self-pay | Admitting: Student

## 2023-12-28 ENCOUNTER — Ambulatory Visit: Attending: Student | Admitting: Student

## 2023-12-28 VITALS — BP 126/68 | HR 71 | Ht 64.5 in | Wt 221.6 lb

## 2023-12-28 DIAGNOSIS — I2510 Atherosclerotic heart disease of native coronary artery without angina pectoris: Secondary | ICD-10-CM | POA: Diagnosis not present

## 2023-12-28 DIAGNOSIS — I10 Essential (primary) hypertension: Secondary | ICD-10-CM | POA: Diagnosis not present

## 2023-12-28 DIAGNOSIS — I4819 Other persistent atrial fibrillation: Secondary | ICD-10-CM | POA: Diagnosis not present

## 2023-12-28 DIAGNOSIS — E785 Hyperlipidemia, unspecified: Secondary | ICD-10-CM | POA: Diagnosis not present

## 2023-12-28 NOTE — Patient Instructions (Signed)
 Medication Instructions:  No changes at this time.   *If you need a refill on your cardiac medications before your next appointment, please call your pharmacy*  Lab Work: None  If you have labs (blood work) drawn today and your tests are completely normal, you will receive your results only by: MyChart Message (if you have MyChart) OR A paper copy in the mail If you have any lab test that is abnormal or we need to change your treatment, we will call you to review the results.  Testing/Procedures: Your physician has requested that you have an echocardiogram. Echocardiography is a painless test that uses sound waves to create images of your heart. It provides your doctor with information about the size and shape of your heart and how well your heart's chambers and valves are working. This procedure takes approximately one hour. There are no restrictions for this procedure. Please do NOT wear cologne, perfume, aftershave, or lotions (deodorant is allowed). Please arrive 15 minutes prior to your appointment time.  Please note: We ask at that you not bring children with you during ultrasound (echo/ vascular) testing. Due to room size and safety concerns, children are not allowed in the ultrasound rooms during exams. Our front office staff cannot provide observation of children in our lobby area while testing is being conducted. An adult accompanying a patient to their appointment will only be allowed in the ultrasound room at the discretion of the ultrasound technician under special circumstances. We apologize for any inconvenience.   Follow-Up: At [facility], you and your health needs are our priority.  As part of our continuing mission to provide you with exceptional heart care, our providers are all part of one team.  This team includes your primary Cardiologist (physician) and Advanced Practice Providers or APPs (Physician Assistants and Nurse Practitioners) who all work together to  provide you with the care you need, when you need it.  Your next appointment:   4 week(s)  Provider:   Barnie Hila, NP

## 2023-12-28 NOTE — Progress Notes (Signed)
 Cardiology Clinic Note   Date: 12/28/2023 ID: Kara, Wright 28-Sep-1945, MRN [id number]  Primary Cardiologist:  Evalene Lunger, MD  Chief Complaint   Kara Wright is a 78 y.o. female who presents to the clinic today for  follow up after testing.   Patient Profile   Kara Wright is followed by Dr. Gollan for the history outlined below.       Past medical history significant for: Coronary calcification. Nuclear stress test 11/16/2016: Lateral ST segment deviation noted during stress.  LVEF 55 to 65%.  No evidence of perfusion defects noted with stress or rest images.  Transient ischemic dilatation is noted with TID ratio of 1.3.  This is occasionally associated with balanced ischemia due to three-vessel CAD. PAF. Echo 08/16/2018: EF 55 to 60%.  Normal diastolic parameters.  Normal RV size/function.  Moderate LAE.  Mild RAE.  Mild calcification/moderate thickening of mitral valve leaflet.  Moderate thickening/sclerosis of aortic valve. 7-day ZIO 12/06/2023: HR 42 to 167 bpm, average 80 bpm.  A-fib continuous 100% burden.  6 patient triggered events associated with A-fib. Hypertension. Hyperlipidemia. Lipid panel 10/15/2023: LDL 49, HDL 38, TG 148, total 116. TIA. OSA. GERD. Hypothyroidism.  In summary, patient has a history of PAF with cardioversion in 2012.  Echo at that time demonstrated EF 55 to 65%, no RWMA, normal diastolic parameters, normal PA pressure.  Patient had a TIA in March 2013.  Carotid ultrasound at that time showed no evidence of stenosis bilateral ICA.  Echo showed normal LV/RV function with no significant valvular abnormalities.  Patient underwent hospital admission in May 2018 for acute hypoxemic respiratory failure secondary to pneumonia.  Echo at that time showed EF 60 to 65%, normal diastolic parameters, mild LVH, mild MR, mild RVH.  LPa 2018 Dr. Barbaraann again contacted Dr. Gollan or related to incidental finding of coronary calcifications on chest CT.   Patient was not symptomatic with chest pain.  It was suggested she undergo nuclear stress testing as detailed above.  Patient was seen in the office on 03/28/2023 for preop evaluation prior to colonoscopy.  She reported being under some stress at home.  It was recommended she start diltiazem  120 mg in the morning and continue propranolol  in the evening.  No other changes were made.   Patient was last seen in the office by me on 11/16/2023 for routine follow-up.  She was in A-fib at the time of her visit with no cardiac awareness.  She stated at times she felt she was in A-fib she had heaviness in her chest similar to indigestion and felt as though her heart was pounding.  She reported being under an increased amount of stress with her daughter and grandson living in her home.  She reported typically using CPAP although did not use it the night prior secondary to it making a funny sound.  She reported missing doses of Xarelto  at times.  7-day ZIO showed 100% A-fib burden.     History of Present Illness    Today, patient is here alone. She reports feeling more easily winded in the last several months. She also feels her energy has been decreasing particularly in the last 2 months. She reports occasional palpitations described as heart racing but no chest heaviness. Overall she has no awareness of afib. She is compliant with CPAP. Discussed results of heart monitor. She is uncertain if she missed any doses of Xarelto  and states I have tried really hard to remember to take  it.  All questions answered.     ROS: All other systems reviewed and are otherwise negative except as noted in History of Present Illness.  EKGs/Labs Reviewed    EKG Interpretation Date/Time:  Friday December 28 2023 15:13:35 EDT Ventricular Rate:  71 PR Interval:    QRS Duration:  88 QT Interval:  382 QTC Calculation: 415 R Axis:   86  Text Interpretation: Atrial fibrillation When compared with ECG of 16-Nov-2023 14:29, No  significant change was found Confirmed by Loistine Sober [phone]) on 12/28/2023 3:15:36 PM   10/15/2023: ALT 9; AST 16; BUN 7; Creatinine, Ser 0.76; Potassium 4.0; Sodium 142   10/15/2023: Hemoglobin 12.3; WBC 6.5   10/15/2023: TSH 0.77    Risk Assessment/Calculations     CHA2DS2-VASc Score = 6   This indicates a 9.7% annual risk of stroke. The patient's score is based upon: CHF History: 0 HTN History: 1 Diabetes History: 0 Stroke History: 2 Vascular Disease History: 0 Age Score: 2 Gender Score: 1             Physical Exam    VS:  BP 126/68   Pulse 71   Ht 5' 4.5 (1.638 m)   Wt 221 lb 9.6 oz (100.5 kg)   SpO2 96%   BMI 37.45 kg/m  , BMI Body mass index is 37.45 kg/m.  GEN: Well nourished, well developed, in no acute distress. Neck: No JVD or carotid bruits. Cardiac: Irregularly irregular.  No murmur. No rubs or gallops.   Respiratory:  Respirations regular and unlabored. Clear to auscultation without rales, wheezing or rhonchi. GI: Soft, nontender, nondistended. Extremities: Radials/DP/PT 2+ and equal bilaterally. No clubbing or cyanosis. No edema.  Skin: Warm and dry, no rash. Neuro: Strength intact.  Assessment & Plan   Coronary calcification Nuclear stress test May 2018 with no evidence of perfusion defects noted with stress or rest images (further details in patient profile).  Patient denies chest pain, pressure or tightness.  - Continue rosuvastatin , propranolol .  Not on aspirin  secondary to Xarelto .   PAF Echo February 2020 showed EF 55 to 60%, normal RV size/function, normal diastolic parameters, moderate LAE, mild RAE, calcification/thickening of the mitral valve leaflet, thickening/sclerosis of aortic valve.  7-day ZIO June 2025 demonstrated 100% A-fib burden, HR 42 to 167 bpm average 80 bpm. Denies spontaneous bleeding concerns.  Patient reports occasional palpitations but overall she has no awareness of afib. She is uncertain if she has missed any  doses of Xarelto . She would like to undergo cardioversion. She agrees with returning in 3 weeks for repeat EKG and to ensure no missed doses of Xarelto . We will schedule cardioversion at that time. In the meantime will order echo.  - Schedule Echo.  - Continue diltiazem , propranolol , as needed immediate release diltiazem , Xarelto .   Hypertension BP today 126/68. No headaches or dizziness reported.  - Continue propranolol , diltiazem .   Hyperlipidemia LDL 49 April 2025, at goal. - Continue rosuvastatin .  Disposition: Echo. Return in 3 weeks or sooner as needed.          Signed, Sober HERO. Mithran Strike, DNP, NP-C

## 2024-01-15 ENCOUNTER — Encounter: Payer: Self-pay | Admitting: Urology

## 2024-01-16 ENCOUNTER — Telehealth: Payer: Self-pay | Admitting: Student

## 2024-01-16 NOTE — Telephone Encounter (Signed)
 Patient reports worsening shortness of breath with exertion, which she believes is related to the recent increase in temperature. She denies swelling. She notes she is unable to walk more than 50 feet without becoming winded and feeling as though she may pass out. The patient suspects she may be back in afib but does not monitor her heart rate. She notes that, unlike previous episodes, she does not feel her heart "pounding out of her chest."  Will forward to NP for recommendations  Pt scheduled for ECHO on 02/05/24

## 2024-01-16 NOTE — Telephone Encounter (Signed)
 Spoke with the patient. She reports that she does not feel like she is retaining excess fluid and does not have a way to check her heart rate. She confirmed that she has not missed any doses of Xarelto . Appointment rescheduled for first available on 01/29/24. Message will be forwarded to scheduling to inquire the possibility of moving up the echocardiogram.  Will forward to NP to make aware

## 2024-01-16 NOTE — Telephone Encounter (Signed)
 Pt c/o Shortness Of Breath: STAT if SOB developed within the last 24 hours or pt is noticeably SOB on the phone  1. Are you currently SOB (can you hear that pt is SOB on the phone)? No  2. How long have you been experiencing SOB? Month bur getting worse  3. Are you SOB when sitting or when up moving around? Moving around  4. Are you currently experiencing any other symptoms? Pt states that she gets short winded when walking a short distance.

## 2024-01-17 NOTE — Telephone Encounter (Signed)
 There is no opening to move the echo up earlier.

## 2024-01-26 NOTE — H&P (View-Only) (Signed)
 Cardiology Clinic Note   Date: 01/29/2024 ID: Tramaine, Snell Jul 02, 1945, MRN [id number]  Primary Cardiologist:  Evalene Lunger, MD  Chief Complaint   Kara Wright is a 78 y.o. female who presents to the clinic today for evaluation of shortness of breath.   Patient Profile   Kara Wright is followed by Dr. Gollan for the history outlined below.      Past medical history significant for: Coronary calcification. Nuclear stress test 11/16/2016: Lateral ST segment deviation noted during stress.  LVEF 55 to 65%.  No evidence of perfusion defects noted with stress or rest images.  Transient ischemic dilatation is noted with TID ratio of 1.3.  This is occasionally associated with balanced ischemia due to three-vessel CAD. PAF. Echo 08/16/2018: EF 55 to 60%.  Normal diastolic parameters.  Normal RV size/function.  Moderate LAE.  Mild RAE.  Mild calcification/moderate thickening of mitral valve leaflet.  Moderate thickening/sclerosis of aortic valve. 7-day ZIO 12/06/2023: HR 42 to 167 bpm, average 80 bpm.  A-fib continuous 100% burden.  6 patient triggered events associated with A-fib. Hypertension. Hyperlipidemia. Lipid panel 10/15/2023: LDL 49, HDL 38, TG 148, total 116. TIA. OSA. GERD. Hypothyroidism.  In summary, patient has a history of PAF with cardioversion in 2012.  Echo at that time demonstrated EF 55 to 65%, no RWMA, normal diastolic parameters, normal PA pressure.  Patient had a TIA in March 2013.  Carotid ultrasound at that time showed no evidence of stenosis bilateral ICA.  Echo showed normal LV/RV function with no significant valvular abnormalities.  Patient underwent hospital admission in May 2018 for acute hypoxemic respiratory failure secondary to pneumonia.  Echo at that time showed EF 60 to 65%, normal diastolic parameters, mild LVH, mild MR, mild RVH.  LPa 2018 Dr. Barbaraann again contacted Dr. Gollan or related to incidental finding of coronary calcifications on  chest CT.  Patient was not symptomatic with chest pain.  It was suggested she undergo nuclear stress testing as detailed above.  Patient was seen in the office on 03/28/2023 for preop evaluation prior to colonoscopy.  She reported being under some stress at home.  It was recommended she start diltiazem  120 mg in the morning and continue propranolol  in the evening.  No other changes were made.   Patient was seen in the office on 11/16/2023 for routine follow-up.  She was in A-fib at the time of her visit with no cardiac awareness.  She stated at times she felt she was in A-fib she had heaviness in her chest similar to indigestion and felt as though her heart was pounding.  She reported being under an increased amount of stress with her daughter and grandson living in her home.  She reported typically using CPAP although did not use it the night prior secondary to it making a funny sound.  She reported missing doses of Xarelto  at times.  7-day ZIO showed 100% A-fib burden.   Patient was last seen in the office by me on 12/28/2023 to follow-up after testing.  She was uncertain if she had missed any doses of Xarelto  so decision was made to defer scheduling cardioversion for another 3 weeks.  Echo was ordered.  Patient contacted the office on 01/16/2024 with complaints of increased DOE.  She denies lower extremity edema.  She described being unable to walk more than 50 feet without becoming winded and presyncopal.  Echo is scheduled for 02/05/2024 and could not be moved up.  History of Present Illness    Today, patient reports she is still experiencing DOE but it has improved a bit with the cooler weather. She does feel in the last couple of month or more she is becoming more dyspneic with less exertion. She also feels increasingly fatigued. Patient denies lower extremity edema, orthopnea or PND. No chest pain, pressure, or tightness. No palpitations. She has no cardiac awareness of afib. She has not missed  any doses of Xarelto . Her medications come prepackaged. She did drop the Xarelto  one night and could not find it so she took a dose from later in the month. She requests samples so she can recover that lost pill.    ROS: All other systems reviewed and are otherwise negative except as noted in History of Present Illness.  EKGs/Labs Reviewed    EKG Interpretation Date/Time:  Tuesday January 29 2024 15:02:04 EDT Ventricular Rate:  74 PR Interval:    QRS Duration:  84 QT Interval:  388 QTC Calculation: 430 R Axis:   78  Text Interpretation: Atrial fibrillation Low voltage QRS When compared with ECG of 28-Dec-2023 15:13, No significant change was found Confirmed by Loistine Sober [phone]) on 01/29/2024 3:13:06 PM   10/15/2023: ALT 9; AST 16; BUN 7; Creatinine, Ser 0.76; Potassium 4.0; Sodium 142   10/15/2023: Hemoglobin 12.3; WBC 6.5   10/15/2023: TSH 0.77    Risk Assessment/Calculations     CHA2DS2-VASc Score = 6   This indicates a 9.7% annual risk of stroke. The patient's score is based upon: CHF History: 0 HTN History: 1 Diabetes History: 0 Stroke History: 2 Vascular Disease History: 0 Age Score: 2 Gender Score: 1             Physical Exam    VS:  BP 139/77   Pulse 74   Ht 5' 4 (1.626 m)   Wt 225 lb 3.2 oz (102.2 kg)   SpO2 96%   BMI 38.66 kg/m  , BMI Body mass index is 38.66 kg/m.  GEN: Well nourished, well developed, in no acute distress. Neck: No JVD or carotid bruits. Cardiac: Irregularly irregular. No murmur. No rubs or gallops.   Respiratory:  Respirations regular and unlabored. Clear to auscultation without rales, wheezing or rhonchi. GI: Soft, nontender, nondistended. Extremities: Radials/DP/PT 2+ and equal bilaterally. No clubbing or cyanosis. No edema.  Skin: Warm and dry, no rash. Neuro: Strength intact.  Assessment & Plan   Coronary calcification Nuclear stress test May 2018 with no evidence of perfusion defects noted with stress or rest  images (further details in patient profile).  Patient denies chest pain, pressure or tightness. - Continue rosuvastatin , propranolol .  Not on aspirin  secondary to Xarelto .   PAF Echo February 2020 showed EF 55 to 60%, normal RV size/function, normal diastolic parameters, moderate LAE, mild RAE, calcification/thickening of the mitral valve leaflet, thickening/sclerosis of aortic valve.  7-day ZIO June 2025 demonstrated 100% A-fib burden, HR 42 to 167 bpm average 80 bpm. Denies spontaneous bleeding concerns.  Patient denies palpitations. She endorses increased dyspnea with minimal exertion and increased fatigue. She has no cardiac awareness of afib. EKG shows afib heart rate 74 bpm. She has not missed any doses of Xarelto . She lost one pill and requests samples today. Unfortunately, we have no samples. Will provide an Rx for 5 pills so she does not miss any doses.  - Echo pending for 8/19 - Continue diltiazem , propranolol , as needed immediate release diltiazem , Xarelto . - Schedule DCCV. - CBC and BMP.  Hypertension BP today 139/77 on intake and 136/78 on my recheck. No headaches or dizziness reported.  - Continue propranolol , diltiazem .   Hyperlipidemia LDL 49 April 2025, at goal. - Continue rosuvastatin .  Disposition: CBC and BMP today. Schedule DCCV. Return in 3 weeks or sooner as needed.      Informed Consent   Shared Decision Making/Informed Consent The risks (stroke, cardiac arrhythmias rarely resulting in the need for a temporary or permanent pacemaker, skin irritation or burns and complications associated with conscious sedation including aspiration, arrhythmia, respiratory failure and death), benefits (restoration of normal sinus rhythm) and alternatives of a direct current cardioversion were explained in detail to Kara Wright and she agrees to proceed.        Signed, Barnie HERO. Kailoni Vahle, DNP, NP-C

## 2024-01-26 NOTE — Progress Notes (Unsigned)
 Cardiology Clinic Note   Date: 01/26/2024 ID: [name], [name] [birth year], MRN [id number]  Primary Cardiologist:  Evalene Lunger, MD  Chief Complaint   Kara Wright is a 78 y.o. female who presents to the clinic today for ***  Patient Profile   Kara Wright is followed by *** for the history outlined below.      Past medical history significant for: Coronary calcification. Nuclear stress test 11/16/2016: Lateral ST segment deviation noted during stress.  LVEF 55 to 65%.  No evidence of perfusion defects noted with stress or rest images.  Transient ischemic dilatation is noted with TID ratio of 1.3.  This is occasionally associated with balanced ischemia due to three-vessel CAD. PAF. Echo 08/16/2018: EF 55 to 60%.  Normal diastolic parameters.  Normal RV size/function.  Moderate LAE.  Mild RAE.  Mild calcification/moderate thickening of mitral valve leaflet.  Moderate thickening/sclerosis of aortic valve. 7-day ZIO 12/06/2023: HR 42 to 167 bpm, average 80 bpm.  A-fib continuous 100% burden.  6 patient triggered events associated with A-fib. Hypertension. Hyperlipidemia. Lipid panel 10/15/2023: LDL 49, HDL 38, TG 148, total 116. TIA. OSA. GERD. Hypothyroidism.  In summary, patient has a history of PAF with cardioversion in 2012.  Echo at that time demonstrated EF 55 to 65%, no RWMA, normal diastolic parameters, normal PA pressure.  Patient had a TIA in March 2013.  Carotid ultrasound at that time showed no evidence of stenosis bilateral ICA.  Echo showed normal LV/RV function with no significant valvular abnormalities.  Patient underwent hospital admission in May 2018 for acute hypoxemic respiratory failure secondary to pneumonia.  Echo at that time showed EF 60 to 65%, normal diastolic parameters, mild LVH, mild MR, mild RVH.  LPa 2018 Dr. Barbaraann again contacted Dr. Gollan or related to incidental finding of coronary calcifications on chest CT.  Patient was not symptomatic with  chest pain.  It was suggested she undergo nuclear stress testing as detailed above.  Patient was seen in the office on 03/28/2023 for preop evaluation prior to colonoscopy.  She reported being under some stress at home.  It was recommended she start diltiazem  120 mg in the morning and continue propranolol  in the evening.  No other changes were made.   Patient was seen in the office on 11/16/2023 for routine follow-up.  She was in A-fib at the time of her visit with no cardiac awareness.  She stated at times she felt she was in A-fib she had heaviness in her chest similar to indigestion and felt as though her heart was pounding.  She reported being under an increased amount of stress with her daughter and grandson living in her home.  She reported typically using CPAP although did not use it the night prior secondary to it making a funny sound.  She reported missing doses of Xarelto  at times.  7-day ZIO showed 100% A-fib burden.   Patient was last seen in the office by me on 12/28/2023 to follow-up after testing.  She was uncertain if she had missed any doses of Xarelto  so decision was made to defer scheduling cardioversion for another 3 weeks.  Echo was ordered.  Patient contacted the office on 01/16/2024 with complaints of increased DOE.  She denies lower extremity edema.  She described being unable to walk more than 50 feet without becoming winded and presyncopal.  Echo is scheduled for 02/05/2024 and could not be moved up.      History of Present Illness  Today, patient ***  Coronary calcification Nuclear stress test May 2018 with no evidence of perfusion defects noted with stress or rest images (further details in patient profile).  Patient denies chest pain, pressure or tightness.*** - Continue rosuvastatin , propranolol .  Not on aspirin  secondary to Xarelto .   PAF Echo February 2020 showed EF 55 to 60%, normal RV size/function, normal diastolic parameters, moderate LAE, mild RAE,  calcification/thickening of the mitral valve leaflet, thickening/sclerosis of aortic valve.  7-day ZIO June 2025 demonstrated 100% A-fib burden, HR 42 to 167 bpm average 80 bpm. Denies spontaneous bleeding concerns.  Patient*** - Echo pending for 8/19 - Continue diltiazem , propranolol , as needed immediate release diltiazem , Xarelto . - Schedule DCCV.   Hypertension BP today***. No headaches or dizziness reported.  - Continue propranolol , diltiazem .   Hyperlipidemia LDL 49 April 2025, at goal.*** - Continue rosuvastatin .  ROS: All other systems reviewed and are otherwise negative except as noted in History of Present Illness.  EKGs/Labs Reviewed        10/15/2023: ALT 9; AST 16; BUN 7; Creatinine, Ser 0.76; Potassium 4.0; Sodium 142   10/15/2023: Hemoglobin 12.3; WBC 6.5   10/15/2023: TSH 0.77   No results found for requested labs within last 365 days.  ***  Risk Assessment/Calculations    {Does this patient have ATRIAL FIBRILLATION?:[phone]} No BP recorded.  {Refresh Note OR Click here to enter BP  :1}***        Physical Exam    VS:  There were no vitals taken for this visit. , BMI There is no height or weight on file to calculate BMI.  GEN: Well nourished, well developed, in no acute distress. Neck: No JVD or carotid bruits. Cardiac: *** RRR. *** No murmur. No rubs or gallops.   Respiratory:  Respirations regular and unlabored. Clear to auscultation without rales, wheezing or rhonchi. GI: Soft, nontender, nondistended. Extremities: Radials/DP/PT 2+ and equal bilaterally. No clubbing or cyanosis. No edema ***  Skin: Warm and dry, no rash. Neuro: Strength intact.  Assessment & Plan   ***  Disposition: ***     {Are you ordering a CV Procedure (e.g. stress test, cath, DCCV, TEE, etc)?   Press F2        :[id number]}   Signed, Barnie HERO. Rose-Marie Hickling, DNP, NP-C

## 2024-01-29 ENCOUNTER — Ambulatory Visit: Attending: Student | Admitting: Student

## 2024-01-29 ENCOUNTER — Encounter: Payer: Self-pay | Admitting: Student

## 2024-01-29 VITALS — BP 139/77 | HR 74 | Ht 64.0 in | Wt 225.2 lb

## 2024-01-29 DIAGNOSIS — E785 Hyperlipidemia, unspecified: Secondary | ICD-10-CM

## 2024-01-29 DIAGNOSIS — Z01810 Encounter for preprocedural cardiovascular examination: Secondary | ICD-10-CM

## 2024-01-29 DIAGNOSIS — I10 Essential (primary) hypertension: Secondary | ICD-10-CM

## 2024-01-29 DIAGNOSIS — I4819 Other persistent atrial fibrillation: Secondary | ICD-10-CM

## 2024-01-29 DIAGNOSIS — I2510 Atherosclerotic heart disease of native coronary artery without angina pectoris: Secondary | ICD-10-CM | POA: Diagnosis not present

## 2024-01-29 MED ORDER — RIVAROXABAN 20 MG PO TABS
20.0000 mg | ORAL_TABLET | Freq: Every day | ORAL | Status: AC
Start: 2024-01-29 — End: 2024-02-01

## 2024-01-29 NOTE — Patient Instructions (Signed)
 Medication Instructions:  Your physician recommends that you continue on your current medications as directed. Please refer to the Current Medication list given to you today.   *If you need a refill on your cardiac medications before your next appointment, please call your pharmacy*  Lab Work: Your provider would like for you to have following labs drawn today CBC, BMet.   If you have labs (blood work) drawn today and your tests are completely normal, you will receive your results only by: MyChart Message (if you have MyChart) OR A paper copy in the mail If you have any lab test that is abnormal or we need to change your treatment, we will call you to review the results.  Testing/Procedures:     Dear Kara Wright  You are scheduled for a Cardioversion on Friday, August 15 with Dr. Darliss.  Please arrive at the [facility] Entrance of [facility], [address], [place] [address] at 11:00 AM (This is 1 hour(s) prior to your procedure time).  Proceed to the Check-In Desk directly inside the entrance.  Procedure Parking: Use the entrance off of the [address] side of the hospital. Turn right upon entering and follow the driveway to parking that is directly in front of the [facility]. There is no valet parking available at this entrance, however there is an awning directly in front of the [facility] for drop off/ pick up for patients.    DIET:  Nothing to eat or drink after midnight except a sip of water with medications (see medication instructions below)  MEDICATION INSTRUCTIONS: !!IF ANY NEW MEDICATIONS ARE STARTED AFTER TODAY, PLEASE NOTIFY YOUR PROVIDER AS SOON AS POSSIBLE!!  FYI: Medications such as Semaglutide (Ozempic, [place]), Tirzepatide (Mounjaro, Zepbound), Dulaglutide (Trulicity), etc (GLP1 agonists) AND Canagliflozin (Invokana), Dapagliflozin (Farxiga), Empagliflozin (Jardiance), Ertugliflozin (Steglatro), Bexagliflozin  [employer]) or any combination with one of these drugs such as Invokamet (Canagliflozin/Metformin), Synjardy (Empagliflozin/Metformin), etc (SGLT2 inhibitors) must be held around the time of a procedure. This is not a comprehensive list of all of these drugs. Please review all of your medications and talk to your provider if you take any one of these. If you are not sure, ask your provider.     Continue taking your anticoagulant (blood thinner): Rivaroxaban  (Xarelto ).  You will need to continue this after your procedure until you are told by your provider that it is safe to stop.    LABS:   Your labs will be done at the hospital prior to your procedure - you will need to arrive 1 and 1/2 hours prior to your procedure.  FYI:  For your safety, and to allow us  to monitor your vital signs accurately during the surgery/procedure we request: If you have artificial nails, gel coating, SNS etc, please have those removed prior to your surgery/procedure. Not having the nail coverings /polish removed may result in cancellation or delay of your surgery/procedure.  Your support person will be asked to wait in the waiting room during your procedure.  It is OK to have someone drop you off and come back when you are ready to be discharged.  You cannot drive after the procedure and will need someone to drive you home.  Bring your insurance cards.  *Special Note: Every effort is made to have your procedure done on time. Occasionally there are emergencies that occur at the hospital that may cause delays. Please be patient if a delay does occur.  Follow-Up: At [facility], you and your health needs are our priority.  As part of our continuing mission to provide you with exceptional heart care, our providers are all part of one team.  This team includes your primary Cardiologist (physician) and Advanced Practice Providers or APPs (Physician Assistants and Nurse Practitioners) who all work together  to provide you with the care you need, when you need it.  Your next appointment:   3 week(s)  Provider:   You may see Timothy Gollan, MD or one of the following Advanced Practice Providers on your designated Care Team:   Lonni Meager, NP Lesley Maffucci, PA-C Bernardino Bring, PA-C Cadence La Russell, PA-C Tylene Lunch, NP Barnie Hila, NP    We recommend signing up for the patient portal called MyChart.  Sign up information is provided on this After Visit Summary.  MyChart is used to connect with patients for Virtual Visits (Telemedicine).  Patients are able to view lab/test results, encounter notes, upcoming appointments, etc.  Non-urgent messages can be sent to your provider as well.   To learn more about what you can do with MyChart, go to [email or web address].

## 2024-01-30 ENCOUNTER — Ambulatory Visit: Payer: Self-pay | Admitting: Student

## 2024-01-30 ENCOUNTER — Other Ambulatory Visit: Payer: Self-pay | Admitting: Student

## 2024-01-30 LAB — BASIC METABOLIC PANEL WITH GFR
BUN/Creatinine Ratio: 19 (ref 12–28)
BUN: 15 mg/dL (ref 8–27)
CO2: 23 mmol/L (ref 20–29)
Calcium: 8.7 mg/dL (ref 8.7–10.3)
Chloride: 103 mmol/L (ref 96–106)
Creatinine, Ser: 0.8 mg/dL (ref 0.57–1.00)
Glucose: 107 mg/dL — ABNORMAL HIGH (ref 70–99)
Potassium: 4.2 mmol/L (ref 3.5–5.2)
Sodium: 142 mmol/L (ref 134–144)
eGFR: 76 mL/min/1.73 (ref >59)

## 2024-01-30 LAB — CBC
Hematocrit: 34.9 % (ref 34.0–46.6)
Hemoglobin: 10.5 g/dL — ABNORMAL LOW (ref 11.1–15.9)
MCH: 23.4 pg — ABNORMAL LOW (ref 26.6–33.0)
MCHC: 30.1 g/dL — ABNORMAL LOW (ref 31.5–35.7)
MCV: 78 fL — ABNORMAL LOW (ref 79–97)
Platelets: 171 x10E3/uL (ref 150–450)
RBC: 4.49 x10E6/uL (ref 3.77–5.28)
RDW: 16 % — ABNORMAL HIGH (ref 11.7–15.4)
WBC: 7.9 x10E3/uL (ref 3.4–10.8)

## 2024-01-30 NOTE — Telephone Encounter (Signed)
*  STAT* If patient is at the pharmacy, call can be transferred to refill team.   1. Which medications need to be refilled? (please list name of each medication and dose if known)   XARELTO  20 MG TABS tablet     4. Which pharmacy/location (including street and city if local pharmacy) is medication to be sent to? [facility] [address] [place] - [address] Phone: [phone]  Fax: [phone]     5. Do they need a 30 day or 90 day supply? 3 pills   Pt states [place], NP told her they were going to send 3 pills to local pharmacy yesterday.

## 2024-01-31 ENCOUNTER — Telehealth: Payer: Self-pay | Admitting: *Deleted

## 2024-01-31 MED ORDER — RIVAROXABAN 20 MG PO TABS: 20.0000 mg | ORAL_TABLET | Freq: Every day | ORAL | 0 refills | Status: AC

## 2024-01-31 MED ORDER — SODIUM CHLORIDE 0.9 % IV SOLN: INTRAVENOUS | Status: DC

## 2024-01-31 NOTE — Telephone Encounter (Signed)
 Left voicemail message to call back so we can review information for her procedure tomorrow.

## 2024-01-31 NOTE — Telephone Encounter (Signed)
 Called to confirm/remind patient of their procedure.   Scheduled for: DCCV  [x]  Date 02/01/24  [x]  Time 12:00 pm  [x]  Arrival time 11:00 am [x]  Location [facility]   [x]  Designated Driver  [x]  Instructions, time, and location reviewed with patient    [x]  H&P within 30 days  [x]  EKG within 30 days  [x]  Orders  [x]  Labs to be done earlier than appt.   []  GFR N/A  [x]  Diet  [x]  Medication instructions Reviewed   [x]  Spoke with patient and reviewed all information []  VM Full/unable to leave message  []  Phone not in service

## 2024-01-31 NOTE — Telephone Encounter (Signed)
 Patient returned RN's call.

## 2024-02-01 ENCOUNTER — Ambulatory Visit: Admitting: Anesthesiology

## 2024-02-01 ENCOUNTER — Ambulatory Visit
Admission: RE | Admit: 2024-02-01 | Discharge: 2024-02-01 | Disposition: A | Discharge status: Home / Self Care | Attending: Cardiology | Admitting: Cardiology

## 2024-02-01 ENCOUNTER — Encounter
Admission: RE | Disposition: A | Discharge status: Home / Self Care | Payer: Self-pay | Source: Home / Self Care | Attending: Cardiology

## 2024-02-01 ENCOUNTER — Other Ambulatory Visit: Payer: Self-pay

## 2024-02-01 ENCOUNTER — Encounter: Payer: Self-pay | Admitting: Cardiology

## 2024-02-01 DIAGNOSIS — E785 Hyperlipidemia, unspecified: Secondary | ICD-10-CM | POA: Diagnosis not present

## 2024-02-01 DIAGNOSIS — G4733 Obstructive sleep apnea (adult) (pediatric): Secondary | ICD-10-CM | POA: Insufficient documentation

## 2024-02-01 DIAGNOSIS — I480 Paroxysmal atrial fibrillation: Secondary | ICD-10-CM | POA: Diagnosis not present

## 2024-02-01 DIAGNOSIS — Z79899 Other long term (current) drug therapy: Secondary | ICD-10-CM | POA: Insufficient documentation

## 2024-02-01 DIAGNOSIS — I10 Essential (primary) hypertension: Secondary | ICD-10-CM | POA: Diagnosis not present

## 2024-02-01 DIAGNOSIS — I2510 Atherosclerotic heart disease of native coronary artery without angina pectoris: Secondary | ICD-10-CM | POA: Insufficient documentation

## 2024-02-01 DIAGNOSIS — Z87891 Personal history of nicotine dependence: Secondary | ICD-10-CM | POA: Diagnosis not present

## 2024-02-01 DIAGNOSIS — Z8673 Personal history of transient ischemic attack (TIA), and cerebral infarction without residual deficits: Secondary | ICD-10-CM | POA: Insufficient documentation

## 2024-02-01 DIAGNOSIS — Z7901 Long term (current) use of anticoagulants: Secondary | ICD-10-CM | POA: Diagnosis not present

## 2024-02-01 DIAGNOSIS — I4891 Unspecified atrial fibrillation: Secondary | ICD-10-CM | POA: Diagnosis not present

## 2024-02-01 DIAGNOSIS — I4819 Other persistent atrial fibrillation: Secondary | ICD-10-CM

## 2024-02-01 DIAGNOSIS — K219 Gastro-esophageal reflux disease without esophagitis: Secondary | ICD-10-CM | POA: Insufficient documentation

## 2024-02-01 DIAGNOSIS — I25118 Atherosclerotic heart disease of native coronary artery with other forms of angina pectoris: Secondary | ICD-10-CM | POA: Diagnosis not present

## 2024-02-01 HISTORY — PX: CARDIOVERSION: SHX1299

## 2024-02-01 SURGERY — CARDIOVERSION
Anesthesia: General

## 2024-02-01 MED ORDER — PROPOFOL 1000 MG/100ML IV EMUL
INTRAVENOUS | Status: AC
  Filled 2024-02-01: qty 100

## 2024-02-01 MED ORDER — PROPOFOL 10 MG/ML IV BOLUS
INTRAVENOUS | Status: DC | PRN
  Administered 2024-02-01: 50 mg via INTRAVENOUS

## 2024-02-01 NOTE — Interval H&P Note (Signed)
 History and Physical Interval Note:  02/01/2024 12:11 PM  Kara Wright  has presented today for surgery, with the diagnosis of persistent Afib.  The various methods of treatment have been discussed with the patient and family. After consideration of risks, benefits and other options for treatment, the patient has consented to  Procedure(s): CARDIOVERSION (N/A) as a surgical intervention.  The patient's history has been reviewed, patient examined, no change in status, stable for surgery.  I have reviewed the patient's chart and labs.  Questions were answered to the patient's satisfaction.     Redell Agbor-Etang

## 2024-02-01 NOTE — Transfer of Care (Signed)
 Immediate Anesthesia Transfer of Care Note  Patient: Kara Wright  Procedure(s) Performed: CARDIOVERSION  Patient Location: PACU  Anesthesia Type:General  Level of Consciousness: awake, alert , and oriented  Airway & Oxygen  Therapy: Patient Spontanous Breathing  Post-op Assessment: Report given to RN and Post -op Vital signs reviewed and stable  Post vital signs: Reviewed and stable  Last Vitals:  Vitals Value Taken Time  BP 153/93 02/01/24 12:12  Temp    Pulse 57 02/01/24 12:14  Resp 24 02/01/24 12:14  SpO2 99 % 02/01/24 12:14  Vitals shown include unfiled device data.  Last Pain:  Vitals:   02/01/24 1112  TempSrc: Oral  PainSc: 0-No pain         Complications: No notable events documented.

## 2024-02-01 NOTE — Anesthesia Postprocedure Evaluation (Signed)
 Anesthesia Post Note  Patient: Kara Wright  Procedure(s) Performed: CARDIOVERSION  Anesthesia Type: General Anesthetic complications: no   No notable events documented.   Last Vitals:  Vitals:   02/01/24 1245 02/01/24 1300  BP: 139/66 (!) 144/97  Pulse: 70 65  Resp: (!) 24 19  Temp:  37.1 C  SpO2: 94% 94%    Last Pain:  Vitals:   02/01/24 1300  TempSrc: Oral  PainSc: 0-No pain                 VAN STAVEREN,Rollo Farquhar

## 2024-02-01 NOTE — Procedures (Signed)
 Cardioversion procedure note For atrial fibrillation.  Procedure Details:  Consent: Risks of procedure as well as the alternatives and risks of each were explained to the (patient/caregiver).  Consent for procedure obtained.  Time Out: Verified patient identification, verified procedure, site/side was marked, verified correct patient position, special equipment/implants available, medications/allergies/relevent history reviewed, required imaging and test results available.  Performed  Patient placed on cardiac monitor, pulse oximetry, supplemental oxygen as necessary.   Sedation given: propofol  IV per anesthesia team Pacer pads placed anterior and posterior chest.   Cardioverted 1 time(s).   Cardioverted at  200J. Synchronized biphasic Converted to NSR   Evaluation: Findings: Post procedure EKG shows: NSR Complications: None Patient did tolerate procedure well.  Constancia Delton, M.D.

## 2024-02-01 NOTE — Anesthesia Preprocedure Evaluation (Signed)
 Anesthesia Evaluation  Patient identified by MRN, date of birth, ID band Patient awake    Reviewed: Allergy  & Precautions, NPO status , Patient's Chart, lab work & pertinent test results  Airway Mallampati: III  TM Distance: >3 FB Neck ROM: full    Dental  (+) Teeth Intact   Pulmonary neg pulmonary ROS, shortness of breath and with exertion, sleep apnea and Continuous Positive Airway Pressure Ventilation , former smoker   Pulmonary exam normal  + decreased breath sounds      Cardiovascular Exercise Tolerance: Good hypertension, + angina  + CAD  negative cardio ROS Normal cardiovascular exam+ dysrhythmias Atrial Fibrillation  Rhythm:Irregular Rate:Abnormal     Neuro/Psych  Headaches  Anxiety     TIAnegative neurological ROS  negative psych ROS   GI/Hepatic negative GI ROS, Neg liver ROS,GERD  Medicated,,  Endo/Other  negative endocrine ROSHypothyroidism  Class 3 obesity  Renal/GU negative Renal ROS  negative genitourinary   Musculoskeletal   Abdominal  (+) + obese  Peds negative pediatric ROS (+)  Hematology negative hematology ROS (+)   Anesthesia Other Findings Past Medical History: No date: Atrial fib/flutter, transient (HCC) 2019: Cataract     Comment:  left eye No date: Clotting disorder (HCC)     Comment:  on xarelto  03/1997: Diverticulosis No date: Dyspnea No date: Dysrhythmia     Comment:  atrial fibrilation 1997: GERD (gastroesophageal reflux disease)     Comment:  good on prilosec No date: Hepatitis     Comment:  hx of hep B (late 1970s) 08/1996: Hyperlipidemia 1984: Hypothyroidism No date: Migraine     Comment:  w/o aura 11/2002: Osteopenia     Comment:  via dexa No date: Skin cancer     Comment:  skin cancer No date: Sleep apnea 08/2011: Stroke (HCC)     Comment:  TIA  Past Surgical History: No date: ABDOMINAL HYSTERECTOMY No date: ANAL FISSURE REPAIR 02/06/2018: CATARACT EXTRACTION  W/PHACO; Right     Comment:  Procedure: CATARACT EXTRACTION PHACO AND INTRAOCULAR               LENS PLACEMENT (IOC) RIGHT;  Surgeon: Mittie Gaskin, MD;  Location: [facility];  Service:               Ophthalmology;  Laterality: Right; 07/2006: CHOLECYSTECTOMY 03/16/2021: LYMPH NODE BIOPSY; Right     Comment:  Procedure: LYMPH NODE BIOPSY;  Surgeon: Jama Cordella MATSU, MD;  Location: [facility];  Service: Vascular;                Laterality: Right; 12/05/2006: MOHS SURGERY     Comment:  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 Primus  BMI    Body Mass Index: 38.62 kg/m      Reproductive/Obstetrics negative OB ROS                              Anesthesia Physical Anesthesia Plan  ASA: 3  Anesthesia Plan: General   Post-op Pain Management:    Induction: Intravenous  PONV Risk Score  and Plan: Propofol  infusion and TIVA  Airway Management Planned: Natural Airway and Nasal Cannula  Additional Equipment:   Intra-op Plan:   Post-operative Plan:   Informed Consent: I have reviewed the patients History and Physical, chart, labs and discussed the procedure including the risks, benefits and alternatives for the proposed anesthesia with the patient or authorized representative who has indicated his/her understanding and acceptance.     Dental Advisory Given  Plan Discussed with: CRNA  Anesthesia Plan Comments:         Anesthesia Quick Evaluation

## 2024-02-04 ENCOUNTER — Telehealth: Payer: Self-pay | Admitting: Cardiovascular Disease

## 2024-02-04 ENCOUNTER — Ambulatory Visit

## 2024-02-04 ENCOUNTER — Other Ambulatory Visit

## 2024-02-04 ENCOUNTER — Encounter: Payer: Self-pay | Admitting: Cardiology

## 2024-02-04 NOTE — Telephone Encounter (Signed)
 Patient reports that after her Cardioversion on Friday she felt fine but starting on Saturday she did not feel well. She started to experience SOB when ambulating and at rest. Overall she reports not feeling well and is just not back to normal. Symptoms occurred after the DCCV so given her new onset SOB scheduled her to come in to be seen at her earliest convenience. She was appreciative with no further needs at this time.

## 2024-02-04 NOTE — Telephone Encounter (Signed)
 Pt c/o Shortness Of Breath: STAT if SOB developed within the last 24 hours or pt is noticeably SOB on the phone  1. Are you currently SOB (can you hear that pt is SOB on the phone)?   No - patient stated she is sitting  2. How long have you been experiencing SOB?  Since Saturday  3. Are you SOB when sitting or when up moving around?   Up and moving around  4. Are you currently experiencing any other symptoms?   Slight headache, diarrhea all day Saturday, felt sick to her stomach   Patient stated on Saturday she could hardly catch her breath and she had to have a nebulizer treatment.  Patient noted she had a cardioversion last week.

## 2024-02-05 ENCOUNTER — Ambulatory Visit: Payer: Self-pay | Admitting: Student

## 2024-02-05 ENCOUNTER — Ambulatory Visit: Attending: Student

## 2024-02-05 DIAGNOSIS — I4819 Other persistent atrial fibrillation: Secondary | ICD-10-CM

## 2024-02-05 LAB — ECHOCARDIOGRAM COMPLETE
AR max vel: 2.04 cm2
AV Area VTI: 2.08 cm2
AV Area mean vel: 1.95 cm2
AV Mean grad: 4 mmHg
AV Peak grad: 6.6 mmHg
Ao pk vel: 1.28 m/s
S' Lateral: 3.74 cm

## 2024-02-06 ENCOUNTER — Other Ambulatory Visit: Payer: Self-pay | Admitting: Emergency Medicine

## 2024-02-06 DIAGNOSIS — G4733 Obstructive sleep apnea (adult) (pediatric): Secondary | ICD-10-CM | POA: Diagnosis not present

## 2024-02-06 MED ORDER — FUROSEMIDE 20 MG PO TABS: 20.0000 mg | ORAL_TABLET | Freq: Every day | ORAL | 0 refills | Status: DC

## 2024-02-06 MED ORDER — FUROSEMIDE 20 MG PO TABS
20.0000 mg | ORAL_TABLET | Freq: Every day | ORAL | 3 refills | Status: DC
Start: 2024-02-06 — End: 2024-02-06

## 2024-02-06 NOTE — Telephone Encounter (Signed)
 Called and spoke with the patient to relay ECHO results as interpreted by Barnie Hila, NP.  Informed the patient on starting Lasix  20 mg daily and maintaining a daily weight log to bring to the next office appointment.  Patient asked that prescription to local pharmacy be only for the 30-day supply and will need to be reordered at the mail-order pharmacy thereafter.  Patient verbalized understanding of therapy plan and instructions with all questions and concerns addressed at this time.

## 2024-02-06 NOTE — Telephone Encounter (Signed)
-----   Message from Barnie Hila sent at 02/06/2024  7:47 AM EDT ----- Please let patient know echo shows normal pumping function. There is evidence that she is holding onto some extra fluid. This could be causing her to feel poorly. I would like her to start Lasix  20  mg daily and keep follow up on 8/25. Recommend she weigh daily (same time, same state of dress) and bring weight log to follow up.   Thank you!  DW  ----- Message ----- From: Interface, [employer] Sent: 02/05/2024   4:20 PM EDT To: Barnie Hila, NP

## 2024-02-06 NOTE — Progress Notes (Signed)
 Called and spoke with the patient to relay ECHO results as interpreted by Barnie Hila, NP.  Informed the patient on starting Lasix  20 mg daily and maintaining a daily weight log to bring to the next office appointment.  Patient asked that prescription to local pharmacy be only for the 30-day supply and will need to be reordered at the mail-order pharmacy thereafter.  Patient verbalized understanding of therapy plan and instructions with all questions and concerns addressed at this time.

## 2024-02-07 NOTE — Progress Notes (Signed)
 Cardiology Clinic Note   Date: 02/11/2024 ID: Kara Wright, Kara Wright [birth year], MRN [id number]  Primary Cardiologist:  Evalene Lunger, MD  Chief Complaint   Kara Wright is a 78 y.o. female who presents to the clinic today for evaluation of dyspnea.   Patient Profile   Kara Wright is followed by Dr. Gollan for the history outlined below.      Past medical history significant for: Coronary calcification. Nuclear stress test 11/16/2016: Lateral ST segment deviation noted during stress.  LVEF 55 to 65%.  No evidence of perfusion defects noted with stress or rest images.  Transient ischemic dilatation is noted with TID ratio of 1.3.  This is occasionally associated with balanced ischemia due to three-vessel CAD. PAF. 7-day ZIO 12/06/2023: HR 42 to 167 bpm, average 80 bpm.  A-fib continuous 100% burden.  6 patient triggered events associated with A-fib. DCCV 02/01/2024. Echo 02/05/2024: EF 60 to 65%.  Indeterminate diastolic parameters.  Moderately reduced RV function, moderate RVH.  With severely elevated PA pressure, RVSP 72 mmHg.  Severe RAE.  Mild to moderate TR.  Dilated IVC, RA pressure 15 mmHg. Hypertension. Hyperlipidemia. Lipid panel 10/15/2023: LDL 49, HDL 38, TG 148, total 116. TIA. OSA. GERD. Hypothyroidism.  In summary, patient has a history of PAF with cardioversion in 2012.  Echo at that time demonstrated EF 55 to 65%, no RWMA, normal diastolic parameters, normal PA pressure.  Patient had a TIA in March 2013.  Carotid ultrasound at that time showed no evidence of stenosis bilateral ICA.  Echo showed normal LV/RV function with no significant valvular abnormalities.  Patient underwent hospital admission in May 2018 for acute hypoxemic respiratory failure secondary to pneumonia.  Echo at that time showed EF 60 to 65%, normal diastolic parameters, mild LVH, mild MR, mild RVH.  LPa 2018 Dr. Barbaraann again contacted Dr. Gollan or related to incidental finding of coronary  calcifications on chest CT.  Patient was not symptomatic with chest pain.  It was suggested she undergo nuclear stress testing as detailed above.  Patient was seen in the office on 03/28/2023 for preop evaluation prior to colonoscopy.  She reported being under some stress at home.  It was recommended she start diltiazem  120 mg in the morning and continue propranolol  in the evening.  No other changes were made.   Patient was seen in the office on 11/16/2023 for routine follow-up.  She was in A-fib at the time of her visit with no cardiac awareness.  She stated at times she felt she was in A-fib she had heaviness in her chest similar to indigestion and felt as though her heart was pounding.  She reported being under an increased amount of stress with her daughter and grandson living in her home.  She reported typically using CPAP although did not use it the night prior secondary to it making a funny sound.  She reported missing doses of Xarelto  at times.  7-day ZIO showed 100% A-fib burden.   Patient was seen in the office 12/28/2023 to follow-up after testing.  She was uncertain if she had missed any doses of Xarelto  so decision was made to defer scheduling cardioversion for another 3 weeks.  Echo was ordered.  Patient contacted the office on 01/16/2024 with complaints of increased DOE.  She denies lower extremity edema.  She described being unable to walk more than 50 feet without becoming winded and presyncopal.  Echo is scheduled for 02/05/2024 and could not be moved up.  Patient was last seen in the office by me on 01/29/2024 for evaluation of shortness of breath.  She reported increased DOE and fatigue.  She remained in A-fib HR 74 bpm.  She was scheduled for DCCV which she underwent on 02/01/2024.  Patient contacted the office on 02/04/2024 with complaints of shortness of breath.  Per triage RN Patient reports that after her Cardioversion on Friday she felt fine but starting on Saturday she did not feel  well. She started to experience SOB when ambulating and at rest. Overall she reports not feeling well and is just not back to normal. Symptoms occurred after the DCCV so given her new onset SOB scheduled her to come in to be seen at her earliest convenience. She was appreciative with no further needs at this time.  Echo on 02/05/2024 demonstrated severely elevated PA pressure.  Patient was contacted and started on Lasix  20 mg daily.     History of Present Illness    Today, patient reports she is feeling slightly better since starting the Lasix . With her first dose of Lasix  her weight decreased 2 lb. She then had to skip a dose secondary to undergoing a dental procedure. She has taken it daily since and her weight is up and down. She has noticed a little more lower extremity edema. She continues to become breathless with activity. She does not have palpitations.     ROS: All other systems reviewed and are otherwise negative except as noted in History of Present Illness.  EKGs/Labs Reviewed    EKG Interpretation Date/Time:  Monday February 11 2024 15:10:05 EDT Ventricular Rate:  83 PR Interval:    QRS Duration:  80 QT Interval:  370 QTC Calculation: 434 R Axis:   80  Text Interpretation: Atrial fibrillation When compared with ECG of 01-Feb-2024 12:10, PREVIOUS ECG IS PRESENT Confirmed by Loistine Sober [phone]) on 02/11/2024 3:20:16 PM   10/15/2023: ALT 9; AST 16 01/29/2024: BUN 15; Creatinine, Ser 0.80; Potassium 4.2; Sodium 142   01/29/2024: Hemoglobin 10.5; WBC 7.9   10/15/2023: TSH 0.77    Risk Assessment/Calculations     CHA2DS2-VASc Score = 6   This indicates a 9.7% annual risk of stroke. The patient's score is based upon: CHF History: 0 HTN History: 1 Diabetes History: 0 Stroke History: 2 Vascular Disease History: 0 Age Score: 2 Gender Score: 1             Physical Exam    VS:  BP 126/81 (BP Location: Left Wrist, Patient Position: Sitting, Cuff Size: Normal)    Pulse 83   Ht 5' 4 (1.626 m)   Wt 223 lb 6.4 oz (101.3 kg)   SpO2 93%   BMI 38.35 kg/m  , BMI Body mass index is 38.35 kg/m.  GEN: Well nourished, well developed, in no acute distress. Neck: No JVD or carotid bruits. Cardiac: Irregularly irregular.  No murmur. No rubs or gallops.   Respiratory:  Respirations regular and unlabored. Clear to auscultation without rales, wheezing or rhonchi. GI: Soft, nontender, nondistended. Extremities: Radials/DP/PT 2+ and equal bilaterally. No clubbing or cyanosis. 1+ pitting edema bilateral lower extremities.   Skin: Warm and dry, no rash. Neuro: Strength intact.  Assessment & Plan   Coronary calcification Nuclear stress test May 2018 with no evidence of perfusion defects noted with stress or rest images (further details in patient profile).  Patient denies chest pain, pressure or tightness. - Continue rosuvastatin , propranolol .  Not on aspirin  secondary to Xarelto .   PAF  7-day ZIO June 2025 demonstrated 100% A-fib burden, HR 42 to 167 bpm average 80 bpm.  DCCV 02/01/2024.  Echo 02/05/2024 demonstrated EF 60 to 65%.  Moderately reduced RV function, mild RVH, severely elevated PA pressure, severe RAE, mild to moderate TR.  Denies spontaneous bleeding concerns.  Patient reports she knew she went back into afib the day after cardioversion. She continues to have DOE. EKG today shows afib 83 bpm. She has not missed any doses of Xarelto .  - Continue diltiazem , propranolol , as needed immediate release diltiazem , Xarelto . - Refer to EP.  Lower extremity edema/pulmonary hypertension  Echo August 2025 showed severely elevated PA pressure. Patient reports DOE and lower extremity edema. She feels somewhat improved since starting Lasix . She initially lost 2 lb and it has been up and down since. 1+ pitting edema on exam today. Lung sounds clear.  - BMP today. - Continue Lasix  at current dose. Will consider short course of an increased dose based on lab work.    Hypertension BP today 126/81. No headaches or dizziness.  - Continue propranolol , diltiazem .   Hyperlipidemia LDL 49 April 2025, at goal. - Continue rosuvastatin .  Disposition: BMP today. Refer to EP. Return in 3 months or sooner as needed.          Signed, Barnie HERO. Tylee Yum, DNP, NP-C

## 2024-02-08 ENCOUNTER — Ambulatory Visit: Admitting: Student

## 2024-02-11 ENCOUNTER — Encounter: Payer: Self-pay | Admitting: Student

## 2024-02-11 ENCOUNTER — Ambulatory Visit: Attending: Student | Admitting: Student

## 2024-02-11 VITALS — BP 126/81 | HR 83 | Ht 64.0 in | Wt 223.4 lb

## 2024-02-11 DIAGNOSIS — I2510 Atherosclerotic heart disease of native coronary artery without angina pectoris: Secondary | ICD-10-CM

## 2024-02-11 DIAGNOSIS — E785 Hyperlipidemia, unspecified: Secondary | ICD-10-CM | POA: Diagnosis not present

## 2024-02-11 DIAGNOSIS — Z79899 Other long term (current) drug therapy: Secondary | ICD-10-CM | POA: Diagnosis not present

## 2024-02-11 DIAGNOSIS — I272 Pulmonary hypertension, unspecified: Secondary | ICD-10-CM | POA: Diagnosis not present

## 2024-02-11 DIAGNOSIS — R600 Localized edema: Secondary | ICD-10-CM

## 2024-02-11 DIAGNOSIS — I10 Essential (primary) hypertension: Secondary | ICD-10-CM

## 2024-02-11 DIAGNOSIS — I4819 Other persistent atrial fibrillation: Secondary | ICD-10-CM | POA: Diagnosis not present

## 2024-02-11 NOTE — Patient Instructions (Signed)
 Medication Instructions:  Your physician recommends that you continue on your current medications as directed. Please refer to the Current Medication list given to you today.   *If you need a refill on your cardiac medications before your next appointment, please call your pharmacy*  Lab Work: Your provider would like for you to have following labs drawn today [facility].   If you have labs (blood work) drawn today and your tests are completely normal, you will receive your results only by: MyChart Message (if you have MyChart) OR A paper copy in the mail If you have any lab test that is abnormal or we need to change your treatment, we will call you to review the results.  Testing/Procedures: None ordered at this time   Your cardiologist has referred you to Cardiology Electrophysiology  We have attached their office location and phone number below.  Please allow them 3-5 business days to reach out to you to make an appointment.  If you have not heard from their office within that time, please call them to schedule your appointment.    Follow-Up: At [facility], you and your health needs are our priority.  As part of our continuing mission to provide you with exceptional heart care, our providers are all part of one team.  This team includes your primary Cardiologist (physician) and Advanced Practice Providers or APPs (Physician Assistants and Nurse Practitioners) who all work together to provide you with the care you need, when you need it.  Your next appointment:   3 month(s)  Provider:   Timothy Gollan, MD or Barnie Hila, NP    We recommend signing up for the patient portal called MyChart.  Sign up information is provided on this After Visit Summary.  MyChart is used to connect with patients for Virtual Visits (Telemedicine).  Patients are able to view lab/test results, encounter notes, upcoming appointments, etc.  Non-urgent messages can be sent to your provider as well.    To learn more about what you can do with MyChart, go to [email or web address].

## 2024-02-12 ENCOUNTER — Ambulatory Visit: Payer: Self-pay | Admitting: Student

## 2024-02-12 ENCOUNTER — Telehealth: Payer: Self-pay | Admitting: *Deleted

## 2024-02-12 ENCOUNTER — Other Ambulatory Visit: Payer: Self-pay | Admitting: *Deleted

## 2024-02-12 DIAGNOSIS — Z79899 Other long term (current) drug therapy: Secondary | ICD-10-CM

## 2024-02-12 LAB — BASIC METABOLIC PANEL WITH GFR
BUN/Creatinine Ratio: 10 — ABNORMAL LOW (ref 12–28)
BUN: 9 mg/dL (ref 8–27)
CO2: 28 mmol/L (ref 20–29)
Calcium: 9.1 mg/dL (ref 8.7–10.3)
Chloride: 99 mmol/L (ref 96–106)
Creatinine, Ser: 0.89 mg/dL (ref 0.57–1.00)
Glucose: 91 mg/dL (ref 70–99)
Potassium: 4.4 mmol/L (ref 3.5–5.2)
Sodium: 142 mmol/L (ref 134–144)
eGFR: 67 mL/min/1.73 (ref >59)

## 2024-02-12 NOTE — Telephone Encounter (Signed)
 Called and spoke to patient to let her know that Barnie Hila, NP reviewed labs and her  kidney function and electrolytes are normal.  Pt verbalized understanding about increasing Lasix  to 40mg  daily x 5 day and then return to Lasix  20 mg daily.  Order for [facility] for future lab draw has been entered for 02/25/2024.  Pt verbalizes understanding

## 2024-02-26 ENCOUNTER — Ambulatory Visit: Admitting: Student

## 2024-02-28 ENCOUNTER — Other Ambulatory Visit: Payer: Self-pay | Admitting: Student

## 2024-03-12 ENCOUNTER — Ambulatory Visit
Admission: RE | Admit: 2024-03-12 | Discharge: 2024-03-12 | Disposition: A | Discharge status: Home / Self Care | Source: Ambulatory Visit | Attending: Family Medicine | Admitting: Family Medicine

## 2024-03-12 ENCOUNTER — Ambulatory Visit: Payer: Self-pay | Admitting: Family Medicine

## 2024-03-12 DIAGNOSIS — Z780 Asymptomatic menopausal state: Secondary | ICD-10-CM | POA: Insufficient documentation

## 2024-03-12 DIAGNOSIS — Z13820 Encounter for screening for osteoporosis: Secondary | ICD-10-CM | POA: Insufficient documentation

## 2024-03-12 DIAGNOSIS — Z1231 Encounter for screening mammogram for malignant neoplasm of breast: Secondary | ICD-10-CM | POA: Diagnosis not present

## 2024-03-12 DIAGNOSIS — M8589 Other specified disorders of bone density and structure, multiple sites: Secondary | ICD-10-CM | POA: Insufficient documentation

## 2024-03-12 DIAGNOSIS — M8580 Other specified disorders of bone density and structure, unspecified site: Secondary | ICD-10-CM

## 2024-03-16 ENCOUNTER — Ambulatory Visit: Payer: Self-pay | Admitting: Family Medicine

## 2024-03-26 ENCOUNTER — Ambulatory Visit: Admitting: Cardiology

## 2024-03-27 ENCOUNTER — Other Ambulatory Visit (HOSPITAL_COMMUNITY): Payer: Self-pay

## 2024-03-27 ENCOUNTER — Ambulatory Visit: Attending: Cardiology | Admitting: Cardiology

## 2024-03-27 ENCOUNTER — Telehealth (HOSPITAL_COMMUNITY): Payer: Self-pay | Admitting: Pharmacy Technician

## 2024-03-27 VITALS — BP 104/62 | HR 84 | Ht 64.0 in | Wt 222.2 lb

## 2024-03-27 DIAGNOSIS — I4819 Other persistent atrial fibrillation: Secondary | ICD-10-CM | POA: Diagnosis not present

## 2024-03-27 DIAGNOSIS — D6869 Other thrombophilia: Secondary | ICD-10-CM | POA: Diagnosis not present

## 2024-03-27 NOTE — Patient Instructions (Signed)
 Medication Instructions:  Your physician recommends that you continue on your current medications as directed. Please refer to the Current Medication list given to you today.   *If you need a refill on your cardiac medications before your next appointment, please call your pharmacy*  Lab Work: No labs ordered today  If you have labs (blood work) drawn today and your tests are completely normal, you will receive your results only by: MyChart Message (if you have MyChart) OR A paper copy in the mail If you have any lab test that is abnormal or we need to change your treatment, we will call you to review the results.  Testing/Procedures:   Please report to Radiology at [facility] Main Entrance, medical mall, 30 mins prior to your test.  [address]  [place], [place]  How to Prepare for Your Cardiac PET/CT Stress Test:  Nothing to eat or drink, except water, 3 hours prior to arrival time.  NO caffeine/decaffeinated products, or chocolate 12 hours prior to arrival. (Please note decaffeinated beverages (teas/coffees) still contain caffeine).  If you have caffeine within 12 hours prior, the test will need to be rescheduled.  Medication instructions: Do not take erectile dysfunction medications for 72 hours prior to test (sildenafil, tadalafil) Do not take nitrates (isosorbide mononitrate, Ranexa) the day before or day of test Do not take tamsulosin the day before or morning of test Hold theophylline containing medications for 12 hours. Hold Dipyridamole 48 hours prior to the test.  Diabetic Preparation: If able to eat breakfast prior to 3 hour fasting, you may take all medications, including your insulin. Do not worry if you miss your breakfast dose of insulin - start at your next meal. If you do not eat prior to 3 hour fast-Hold all diabetes (oral and insulin) medications. Patients who wear a continuous glucose monitor MUST remove the device prior to  scanning.  You may take your remaining medications with water.  NO perfume, cologne or lotion on chest or abdomen area. FEMALES - Please avoid wearing dresses to this appointment.  Total time is 1 to 2 hours; you may want to bring reading material for the waiting time.  IF YOU THINK YOU MAY BE PREGNANT, OR ARE NURSING PLEASE INFORM THE TECHNOLOGIST.  In preparation for your appointment, medication and supplies will be purchased.  Appointment availability is limited, so if you need to cancel or reschedule, please call the Radiology Department Scheduler at [phone] 24 hours in advance to avoid a cancellation fee of $100.00  What to Expect When you Arrive:  Once you arrive and check in for your appointment, you will be taken to a preparation room within the Radiology Department.  A technologist or Nurse will obtain your medical history, verify that you are correctly prepped for the exam, and explain the procedure.  Afterwards, an IV will be started in your arm and electrodes will be placed on your skin for EKG monitoring during the stress portion of the exam. Then you will be escorted to the PET/CT scanner.  There, staff will get you positioned on the scanner and obtain a blood pressure and EKG.  During the exam, you will continue to be connected to the EKG and blood pressure machines.  A small, safe amount of a radioactive tracer will be injected in your IV to obtain a series of pictures of your heart along with an injection of a stress agent.    After your Exam:  It is recommended that  you eat a meal and drink a caffeinated beverage to counter act any effects of the stress agent.  Drink plenty of fluids for the remainder of the day and urinate frequently for the first couple of hours after the exam.  Your doctor will inform you of your test results within 7-10 business days.  For more information and frequently asked questions, please visit our  website: [email or web address]  For questions about your test or how to prepare for your test, please call: Cardiac Imaging Nurse Navigators Office: [phone]   Follow-Up: At [facility], you and your health needs are our priority.  As part of our continuing mission to provide you with exceptional heart care, our providers are all part of one team.  This team includes your primary Cardiologist (physician) and Advanced Practice Providers or APPs (Physician Assistants and Nurse Practitioners) who all work together to provide you with the care you need, when you need it.  Your next appointment:   3 month(s)  Provider:   Suzann Riddle, NP

## 2024-03-27 NOTE — Telephone Encounter (Signed)
 Patient Product/process development scientist completed.    The patient is insured through [employer]/ [employer]. Patient has Medicare and is not eligible for a copay card, but may be able to apply for patient assistance or Medicare RX Payment Plan (Patient Must reach out to their plan, if eligible for payment plan), if available.    Ran test claim for Multaq 400 mgand the current 30 day co-pay is $0.00.   This test claim was processed through [facility]- copay amounts may vary at other pharmacies due to pharmacy/plan contracts, or as the patient moves through the different stages of their insurance plan.     Reyes Sharps, CPHT Pharmacy Technician Patient Advocate Specialist Lead [facility] Patient Advocate Team Direct Number: [phone]  Fax: [phone]

## 2024-03-27 NOTE — Progress Notes (Signed)
 Electrophysiology Clinic Note    Date:  03/27/2024  Patient ID:  [name], [name] Jan 29, 1946, MRN [id number] PCP:  Cleatus Arlyss RAMAN, MD  Cardiologist:  Evalene Lunger, MD  Cardiology APP:  Loistine Sober, NP  Electrophysiology APP:  Trenae Brunke, NP     Discussed the use of AI scribe software for clinical note transcription with the patient, who gave verbal consent to proceed.   Patient Profile    Chief Complaint: AFib  History of Present Illness: Kara Wright is a 78 y.o. female with PMH notable for persis AFib, CAD (?), HTN, pulmHTN, HLD, OSA on CPAP; seen today for electrophysiology evaluation of AFib as requested by NP W[place].   Parox aFib initially diagnosed 2012. She was seen in cardiology office 10/2023 for routine follow-up, was symptomatic with chest heaviness. She had missed xarelto  doses , so DCCV deferred.  Updated zio monitor revealed 100% AF burden. She was again seen 12/2023, DCCV again deferred d/t missed OAC doses.  She was seen again a few weeks later with increased DOE, lower extremity edema. Updated TTE with severely elevated PA pressure and she was started on daily lasix . She is now s/p DCCV 8/15. Held sinus for a couple days but on follow-up 8/25 was back in AFib.   On follow-up today, she can no longer discern whether she is in AFib or not. She does note that after her DCCV she had flu-like symptoms including nausea and malaise for about 3 days. She is making a much more concerted effort to take xarelto , no recent missed doses. She does sometimes forget to take her lasix  in the morning, and takes later in the day which results in urination overnight.  She uses CPAP nightly  She denies chest pain, chest pressure, SOB, dizziness or presyncope currently.   She continues to work part-time.  Her sister joins for appointment.   Arrhythmia/Device History No specialty comments available.    ROS:  Please see the history of present illness.  All other systems are reviewed and otherwise negative.    Physical Exam    VS:  BP 104/62 (BP Location: Left Arm, Patient Position: Sitting, Cuff Size: Large)   Pulse 84   Ht 5' 4 (1.626 m)   Wt 222 lb 3.2 oz (100.8 kg)   SpO2 98%   BMI 38.14 kg/m  BMI: Body mass index is 38.14 kg/m.           Wt Readings from Last 3 Encounters:  03/27/24 222 lb 3.2 oz (100.8 kg)  02/11/24 223 lb 6.4 oz (101.3 kg)  02/01/24 225 lb (102.1 kg)     GEN- The patient is well appearing, alert and oriented x 3 today.   Lungs- Clear to ausculation bilaterally, normal work of breathing.  Heart- Irregularly irregular rate and rhythm, no murmurs, rubs or gallops Extremities- 1+ peripheral edema, warm, dry   Studies Reviewed   Previous EP, cardiology notes.    EKG is not ordered. Personal review of EKG from 02/11/2024 shows:  AFib at 83bpm         TTE, 02/05/2024  1. Left ventricular ejection fraction, by estimation, is 60 to 65%. The left ventricle has normal function. Left ventricular diastolic parameters are indeterminate.   2. Right ventricular systolic function is moderately reduced. The right ventricular size is moderately enlarged. There is severely elevated pulmonary artery systolic pressure. Estimated PASP of .   3. Right atrial size was severely dilated.   4. Tricuspid  valve regurgitation is mild to moderate.   5. The inferior vena cava is dilated in size with <50% respiratory variability, suggesting right atrial pressure of 15 mmHg.   6. When compared to prior study dated 08/16/2018, the RV function appears worse and there is now evidence of severely elevated PA pressures.     Assessment and Plan     #) persis AFib S/p DCCV 8/15 with return to AFib a few days after. During previous OV with cardiology, she did indicate that she could breath easier when in sinus rhythm, but now does not have cardiac awareness.  We discussed AF treatment including AF ablation and AAD options.  When discussing Class 1c medications, she was not aware of her CAD diagnosis and does not believe she has CAD.  At this time, would favor class 1c AAD as bridge to ablation Will proceed with PET CT to eval for ischemia. If negative, will start 50mg  flecainide BID She will continue 120mg  propranolol  daily and 120mg  diltiazem  at this time Will preemptively schedule AF ablation with Dr. Kennyth, defer to MD regarding final recommendation    #) Hypercoag d/t persis afib CHA2DS2-VASc Score = at least 6 [CHF History: 0, HTN History: 1, Diabetes History: 0, Stroke History: 2, Vascular Disease History: 0, Age Score: 2, Gender Score: 1].  Therefore, the patient's annual risk of stroke is 9.7 %.    Stroke ppx - 20mg  xarelto  daily, appropriately dosed No bleeding concerns Strongly urged her to consider using alarms to improve adherence, no recent missed doses   #) lower extremity edema #) pulm HTN Continue 20mg  lasix  daily       Informed Consent   Shared Decision Making/Informed Consent The risks [chest pain, shortness of breath, cardiac arrhythmias, dizziness, blood pressure fluctuations, myocardial infarction, stroke/transient ischemic attack, nausea, vomiting, allergic reaction, radiation exposure, metallic taste sensation and life-threatening complications (estimated to be 1 in 10,000)], benefits (risk stratification, diagnosing coronary artery disease, treatment guidance) and alternatives of a cardiac PET stress test were discussed in detail with Kara Wright and she agrees to proceed.      Current medicines are reviewed at length with the patient today.   The patient does not have concerns regarding her medicines.  The following changes were made today:  none  Labs/ tests ordered today include:  Orders Placed This Encounter  Procedures   NM PET CT CARDIAC PERFUSION MULTI W/ABSOLUTE BLOODFLOW   Cardiac Stress Test: Informed Consent Details: Physician/Practitioner Attestation; Transcribe to  consent form and obtain patient signature     Disposition: Follow up with Dr. Kennyth or EP APP in 2-3 months . Will communicate with patient after Pet CT with AAD recommendations.   Signed, Chantal Needle, NP  03/27/24  7:29 PM  Electrophysiology [facility]

## 2024-04-01 ENCOUNTER — Encounter (HOSPITAL_COMMUNITY): Payer: Self-pay

## 2024-04-03 ENCOUNTER — Ambulatory Visit
Admission: RE | Admit: 2024-04-03 | Discharge: 2024-04-03 | Disposition: A | Discharge status: Home / Self Care | Source: Ambulatory Visit | Attending: Cardiovascular Disease | Admitting: Cardiovascular Disease

## 2024-04-03 DIAGNOSIS — M47814 Spondylosis without myelopathy or radiculopathy, thoracic region: Secondary | ICD-10-CM | POA: Diagnosis not present

## 2024-04-03 DIAGNOSIS — R911 Solitary pulmonary nodule: Secondary | ICD-10-CM | POA: Diagnosis not present

## 2024-04-03 DIAGNOSIS — I4819 Other persistent atrial fibrillation: Secondary | ICD-10-CM | POA: Insufficient documentation

## 2024-04-03 DIAGNOSIS — I700 Atherosclerosis of aorta: Secondary | ICD-10-CM | POA: Insufficient documentation

## 2024-04-03 LAB — NM PET CT CARDIAC PERFUSION MULTI W/ABSOLUTE BLOODFLOW
LV dias vol: 75 mL (ref 46–106)
MBFR: 1.7
Nuc Rest EF: 52 %
Nuc Stress EF: 58 %
Peak HR: 83 {beats}/min
Rest HR: 84 {beats}/min
Rest MBF: 0.97 ml/g/min
Rest Nuclear Isotope Dose: 25.3 mCi
SRS: 0
SSS: 0
ST Depression (mm): 0 mm
Stress MBF: 1.65 ml/g/min
Stress Nuclear Isotope Dose: 25.1 mCi
TID: 1.08

## 2024-04-03 MED ORDER — REGADENOSON 0.4 MG/5ML IV SOLN
INTRAVENOUS | Status: AC
  Filled 2024-04-03: qty 5

## 2024-04-03 MED ORDER — RUBIDIUM RB82 GENERATOR (RUBYFILL)
25.1000 | PACK | Freq: Once | INTRAVENOUS | Status: AC
  Administered 2024-04-03: 25.1 via INTRAVENOUS

## 2024-04-03 MED ORDER — REGADENOSON 0.4 MG/5ML IV SOLN
0.4000 mg | Freq: Once | INTRAVENOUS | Status: AC
  Administered 2024-04-03: 0.4 mg via INTRAVENOUS
  Filled 2024-04-03: qty 5

## 2024-04-03 MED ORDER — RUBIDIUM RB82 GENERATOR (RUBYFILL)
25.2900 | PACK | Freq: Once | INTRAVENOUS | Status: AC
  Administered 2024-04-03: 25.29 via INTRAVENOUS

## 2024-04-05 ENCOUNTER — Ambulatory Visit: Payer: Self-pay | Admitting: Cardiovascular Disease

## 2024-04-07 ENCOUNTER — Other Ambulatory Visit: Payer: Self-pay | Admitting: *Deleted

## 2024-04-07 MED ORDER — FLECAINIDE ACETATE 50 MG PO TABS: 50.0000 mg | ORAL_TABLET | Freq: Two times a day (BID) | ORAL | 3 refills | Status: DC

## 2024-04-07 NOTE — Progress Notes (Signed)
 Spoke with patient and she is willing to try the Flecainide 50 mg BID.  She will call to let me know when she starts it so that she can have her ETT.  Will send her the following instructions once test is ordered.  **FLECAINIDE GXT**  Your provider has ordered a exercise tolerance test. This test will evaluate the blood supply to your heart muscle during periods of exercise and rest. For this test, you will raise your heart rate by walking on a treadmill at different levels.   you may eat a light breakfast/ lunch prior to your procedure no caffeine for 24 hours prior to your test (coffee, tea, soft drinks, or chocolate)  no smoking/ vaping for 4 hours prior to your test you may take your regular medications the day of your test - ** DO NOT hold beta blockers or calcium  channel blockers** bring any inhalers with you to your test wear comfortable clothing & tennis/ non-skid shoes to walk on the treadmill

## 2024-04-08 ENCOUNTER — Other Ambulatory Visit: Payer: Self-pay | Admitting: Family Medicine

## 2024-04-19 ENCOUNTER — Other Ambulatory Visit: Payer: Self-pay | Admitting: Cardiovascular Disease

## 2024-04-28 DIAGNOSIS — Z08 Encounter for follow-up examination after completed treatment for malignant neoplasm: Secondary | ICD-10-CM | POA: Diagnosis not present

## 2024-04-28 DIAGNOSIS — Z85828 Personal history of other malignant neoplasm of skin: Secondary | ICD-10-CM | POA: Diagnosis not present

## 2024-04-28 DIAGNOSIS — L2089 Other atopic dermatitis: Secondary | ICD-10-CM | POA: Diagnosis not present

## 2024-04-28 DIAGNOSIS — L821 Other seborrheic keratosis: Secondary | ICD-10-CM | POA: Diagnosis not present

## 2024-04-28 DIAGNOSIS — D2262 Melanocytic nevi of left upper limb, including shoulder: Secondary | ICD-10-CM | POA: Diagnosis not present

## 2024-04-28 DIAGNOSIS — D225 Melanocytic nevi of trunk: Secondary | ICD-10-CM | POA: Diagnosis not present

## 2024-04-28 DIAGNOSIS — D2261 Melanocytic nevi of right upper limb, including shoulder: Secondary | ICD-10-CM | POA: Diagnosis not present

## 2024-04-28 DIAGNOSIS — D2272 Melanocytic nevi of left lower limb, including hip: Secondary | ICD-10-CM | POA: Diagnosis not present

## 2024-04-28 DIAGNOSIS — D2271 Melanocytic nevi of right lower limb, including hip: Secondary | ICD-10-CM | POA: Diagnosis not present

## 2024-05-07 ENCOUNTER — Ambulatory Visit: Admitting: Internal Medicine

## 2024-05-07 ENCOUNTER — Encounter: Payer: Self-pay | Admitting: Internal Medicine

## 2024-05-07 VITALS — BP 120/80 | HR 69 | Temp 98.2°F | Ht 64.0 in | Wt 222.0 lb

## 2024-05-07 DIAGNOSIS — G4733 Obstructive sleep apnea (adult) (pediatric): Secondary | ICD-10-CM

## 2024-05-07 DIAGNOSIS — J45909 Unspecified asthma, uncomplicated: Secondary | ICD-10-CM

## 2024-05-07 DIAGNOSIS — Z6838 Body mass index (BMI) 38.0-38.9, adult: Secondary | ICD-10-CM

## 2024-05-07 DIAGNOSIS — E669 Obesity, unspecified: Secondary | ICD-10-CM | POA: Diagnosis not present

## 2024-05-07 DIAGNOSIS — I4891 Unspecified atrial fibrillation: Secondary | ICD-10-CM

## 2024-05-07 DIAGNOSIS — R918 Other nonspecific abnormal finding of lung field: Secondary | ICD-10-CM

## 2024-05-07 DIAGNOSIS — Z87891 Personal history of nicotine dependence: Secondary | ICD-10-CM | POA: Diagnosis not present

## 2024-05-07 DIAGNOSIS — Z6841 Body Mass Index (BMI) 40.0 and over, adult: Secondary | ICD-10-CM

## 2024-05-07 NOTE — Patient Instructions (Signed)
 Excellent Job A+ GOLD STAR!!  Continue CPAP as prescribed  Patient Instructions Continue to use CPAP every night, minimum of 4-6 hours a night.  Change equipment every 30 days or as directed by DME.  Wash your tubing with warm soap and water daily, hang to dry. Wash humidifier portion weekly. Use bottled, distilled water and change daily   Be aware of reduced alertness and do not drive or operate heavy machinery if experiencing this or drowsiness.  Exercise encouraged, as tolerated. Encouraged proper weight management.  Important to get eight or more hours of sleep  Limiting the use of the computer and television before bedtime.  Decrease naps during the day, so night time sleep will become enhanced.  Limit caffeine, and sleep deprivation.    Avoid Allergens and Irritants Avoid secondhand smoke Avoid SICK contacts Recommend  Masking  when appropriate Recommend Keep up-to-date with vaccinations  Follow-up with cardiology Continue medications as prescribed

## 2024-05-07 NOTE — Progress Notes (Signed)
 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 Patient recently had follow up CT imaging that revealed stable nodule in the LUL and new nodule in the RLL.  In addition the patient has a past 8 to 46-month history of dyspnea on exertion.  She states that she gets short winded when she is climbs a hill or climb stairs.  When asked about her sleep her husband pipes up that she does snore at times.  She is not sure if she thinks her sleep is restful or not.  Patient does not wheeze.  She occasionally has random left-sided sharp pains in the chest.  This is been going on for some time.  She follows with cardiology regarding her atrial fibrillation.  Review of her CT imaging today in the office also reveals enlarged pulmonary arteries.  This was discussed with the patient.  OV 12/18/2018: Patient seen here today for follow-up regarding multiple pulmonary nodules.  Patient had CT imaging of the chest completed on 12/13/2018 for follow-up regarding her nodules.  There is a new small 5 mm nodule in the right pulmonary apex a new 4 mm nodule in the anterior left upper lobe prior nodules either resolved or stable. Today, she states that she has breathing better.  She does have some dyspnea on exertion.  She was unable to get her sleep appointment scheduled.  We will try again for this.  She was very anxious about having the CT scan for the follow-up of the nodules.  She is relieved to hear that they are smaller.  She does understand there is a few new ones that need follow-up.  Otherwise doing well.  She had PFTs completed prior to today's office which which we reviewed.  It does reveal a reduced FEV1 and FVC with a reserved ratio and a reduced ERV of 10%.  DLCO of 74%.  This is likely related to her weight.  BMI of 40.  We discussed this today in the office as well.  She is trying to watch her weight and her dietary intake.  She does  understand importance of losing weight.  OV 07/22/2020: Patient here today for follow-up regarding recent CT scan of the chest for follow-up of lung nodules.  Patient had a CT scan of the chest on 07/12/2020 which revealed persistent waxing and waning bilateral lung nodules.  The left lower lobe lung nodule 1 cm in size seems to be the largest left the other upper lobe nodules that was seen previously are no longer present have subsided she also has some small adenopathy within the chest.  She does have enlarged pulmonary arteries consistent with possible underlying PAH.  From respiratory standpoint she is stable.  She does not have cough sputum production night sweats weight loss fevers.    OV 06/22/2022: Here today for follow-up.  She had a CT scan in August which showed stability of her pulmonary nodules.  She has no other significant symptoms at this time.  She fell this morning and out the back door when she was trying to move a dog water dish.  She states she is doing okay from that.  She has been working on her weight.  We talked about various weight loss options today in the office.     CC Follow-up assessment for OSA follow-up Assessment for pulmonary nodules Assessment for reactive airways disease   HPI Patient uses and benefits from therapy Using CPAP  nightly and with naps Pressure setting is comfortable and is sleeping well. Previous HST shows AHI of 38  Discussed sleep data and reviewed with patient.  Encouraged proper weight management.  Discussed driving precautions and its relationship with hypersomnolence.  Discussed sleep hygiene, and benefits of a fixed sleep waked time.  The importance of getting eight or more hours of sleep discussed with patient.  Discussed limiting the use of the computer and television before bedtime.  Decrease naps during the day, so night time sleep will become enhanced.  Limit caffeine, and sleep deprivation.   Patient uses and benefits from  therapy Using CPAP nightly and with naps Pressure setting is comfortable and is sleeping well. Excellent compliance report  CT chest shows a history of pulmonary nodules Most recent CT chest December 2024 shows resolution and stability of pulmonary nodules I do not recommend any more follow-up CT scans  No exacerbation at this time No evidence of heart failure at this time No evidence or signs of infection at this time No respiratory distress No fevers, chills, nausea, vomiting, diarrhea No evidence of lower extremity edema No evidence hemoptysis Patient currently on Dupixent for contact dermatitis which is also can be used for reactive airways disease and asthma patient has no significant respiratory compromise at this time  Patient with A-fib on oral anticoagulation Follow-up with cardiology  Patient currently on biological agents Dupixent Patient diagnosed with contact dermatitis         Past Medical History:  Diagnosis Date   Atrial fib/flutter, transient (HCC)    Cataract 2019   left eye   Clotting disorder    on xarelto    Diverticulosis 03/1997   Dyspnea    Dysrhythmia    atrial fibrilation   GERD (gastroesophageal reflux disease) 1997   good on prilosec   Hepatitis    hx of hep B (late 1970s)   Hyperlipidemia 08/1996   Hypothyroidism 1984   Migraine    w/o aura   Osteopenia 11/2002   via dexa   Skin cancer    skin cancer   Sleep apnea    Stroke (HCC) 08/2011   TIA     Family History  Problem Relation Age of Onset   Hypertension Mother    Osteopenia Mother    Stroke Mother        deceased age 57   Colon cancer Father 56       mets   Allergies Sister    Breast cancer Cousin        pat cousin   Colon cancer Nephew    Rectal cancer Neg Hx      Past Surgical History:  Procedure Laterality Date   3 teeth strast     ABDOMINAL HYSTERECTOMY     ANAL FISSURE REPAIR     CARDIOVERSION N/A 02/01/2024   Procedure: CARDIOVERSION;  Surgeon:  Darliss Rogue, MD;  Location: [facility];  Service: Cardiovascular;  Laterality: N/A;   CATARACT EXTRACTION W/PHACO Right 02/06/2018   Procedure: CATARACT EXTRACTION PHACO AND INTRAOCULAR LENS PLACEMENT (IOC) RIGHT;  Surgeon: Mittie Gaskin, MD;  Location: [facility];  Service: Ophthalmology;  Laterality: Right;   CHOLECYSTECTOMY  07/20/2006   LYMPH NODE BIOPSY Right 03/16/2021   Procedure: LYMPH NODE BIOPSY;  Surgeon: Jama Cordella MATSU, MD;  Location: [facility];  Service: Vascular;  Laterality: Right;   MOHS SURGERY  12/05/2006   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 Primus    Social History   Socioeconomic History   Marital status: Widowed    Spouse name: Not on file   Number of children: 2   Years of education: Not on file   Highest education level: Not on file  Occupational History   Occupation: Chartered Certified Accountant: RETIRED    Comment: [employer]   Occupation: Building Control Surveyor: [employer]  Tobacco Use   Smoking status: Former    Current packs/day: 0.00    Types: Cigarettes    Quit date: 06/26/1986    Years since quitting: 37.8    Passive exposure: Past   Smokeless tobacco: Never  Vaping Use   Vaping status: Never Used  Substance and Sexual Activity   Alcohol use: No   Drug use: No   Sexual activity: Not Currently  Other Topics Concern   Not on file  Social History Narrative   Worked part time at 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     Married 1979---widowed 10/22   2 daughters from prev relationship   [employer] fan, [employer] fan.   Social Drivers of Corporate Investment Banker Strain: Low Risk  (08/06/2023)   Overall Financial Resource Strain (CARDIA)    Difficulty of Paying Living Expenses:  Not hard at all  Food Insecurity: No Food Insecurity (08/06/2023)   Hunger Vital Sign    Worried About Running Out of Food in the Last Year: Never true    Ran Out of Food in the Last Year: Never true  Transportation Needs: No Transportation Needs (08/06/2023)   PRAPARE - Administrator, Civil Service (Medical): No    Lack of Transportation (Non-Medical): No  Physical Activity: Insufficiently Active (08/06/2023)   Exercise Vital Sign    Days of Exercise per Week: 3 days    Minutes of Exercise per Session: 30 min  Stress: No Stress Concern Present (08/06/2023)   [employer] of Occupational Health - Occupational Stress Questionnaire    Feeling of Stress : Only a little  Social Connections: Moderately Isolated (08/06/2023)   Social Connection and Isolation Panel    Frequency of Communication with Friends and Family: More than three times a week    Frequency of Social Gatherings with Friends and Family: More than three times a week    Attends Religious Services: More than 4 times per year    Active Member of [employer] or Organizations: No    Attends Banker Meetings: Never    Marital Status: Widowed  Intimate Partner Violence: Not At Risk (08/06/2023)   Humiliation, Afraid, Rape, and Kick questionnaire    Fear of Current or Ex-Partner: No    Emotionally Abused: No    Physically Abused: No    Sexually Abused: No     Allergies  Allergen Reactions   Aurothioglucose Itching    Other reaction(s): Other (See Comments)   Celebrex [Celecoxib]     REACTION: nausea: GI UPSET   Doxycycline  Other (See Comments)    GI upset- not an allergy .     Gold-Containing Drug Products    Nickel Dermatitis    Skin rash, allergy    Other     Fragrance, phenylenediamine, contrast metal agents, gold   Simvastatin  Anxiety  Aches but tolerates crestor    Sodium Metabisulfite Nausea Only, Other (See Comments), Rash and Dermatitis    Positive patch test  Other reaction(s): Other (See  Comments)     Outpatient Medications Prior to Visit  Medication Sig Dispense Refill   albuterol  (VENTOLIN  HFA) 108 (90 Base) MCG/ACT inhaler Inhale 2 puffs into the lungs every 6 (six) hours as needed for wheezing or shortness of breath. 8 g 3   alendronate  (FOSAMAX ) 70 MG tablet TAKE 1 TABLET BY MOUTH IN THE EARLY MORNING ON MONDAY, TAKE WITH A FULL GLASS OF WATER ON AN EMPTY STOMACH 13 tablet 11   cetirizine (ZYRTEC) 10 MG tablet Take 10 mg by mouth daily as needed for allergies. (Patient not taking: Reported on 03/27/2024)     Cholecalciferol (VITAMIN D3) 50 MCG (2000 UT) capsule TAKE 2 CAPSULES BY MOUTH DAILY 180 capsule 1   diltiazem  (CARDIZEM  CD) 120 MG 24 hr capsule Take 1 capsule by mouth once daily 90 capsule 2   diltiazem  (CARDIZEM ) 30 MG tablet TAKE ONE TABLET BY MOUTH THREE TIMES DAILY AS NEEDED 90 tablet 1   Dupilumab (DUPIXENT) 300 MG/2ML SOPN Inject 300 mg into the skin every 14 (fourteen) days.     flecainide  (TAMBOCOR ) 50 MG tablet Take 1 tablet (50 mg total) by mouth 2 (two) times daily. 180 tablet 3   furosemide  (LASIX ) 20 MG tablet Take 1 tablet by mouth once daily 90 tablet 3   levothyroxine  (SYNTHROID ) 150 MCG tablet TAKE 1 TABLET BY MOUTH ON MONDAY THROUGH SATURDAY AND TAKE 2 TABLETS ONCE WEEKLY ON SUNDAY 105 tablet 3   loperamide (IMODIUM A-D) 2 MG tablet Take 2 mg by mouth 2 (two) times daily as needed for diarrhea or loose stools.     omeprazole  (PRILOSEC) 20 MG capsule TAKE ONE (1) CAPSULE BY MOUTH DAILY 90 capsule 1   promethazine  (PHENERGAN ) 25 MG tablet TAKE ONE TABLET BY MOUTH EVERY 6 HOURS AS NEEDED FOR NAUSEA 30 tablet 3   propranolol  ER (INDERAL  LA) 120 MG 24 hr capsule TAKE ONE (1) CAPSULE BY MOUTH AT BEDTIME 90 capsule 1   rivaroxaban  (XARELTO ) 20 MG TABS tablet Take 1 tablet (20 mg total) by mouth daily with supper. 3 tablet 0   rosuvastatin  (CRESTOR ) 5 MG tablet Take 1 tablet (5 mg total) by mouth at bedtime. 90 tablet 3   traMADol  (ULTRAM ) 50 MG tablet TAKE  1 TABLET BY MOUTH EVERY 12 HOURS AS NEEDED 60 tablet 2   No facility-administered medications prior to visit.   BP 120/80   Pulse 69   Temp 98.2 F (36.8 C)   Ht 5' 4 (1.626 m)   Wt 222 lb (100.7 kg)   SpO2 96%   BMI 38.11 kg/m      on RA BMI Readings from Last 3 Encounters:  03/27/24 38.14 kg/m  02/11/24 38.35 kg/m  02/01/24 38.62 kg/m   Wt Readings from Last 3 Encounters:  03/27/24 222 lb 3.2 oz (100.8 kg)  02/11/24 223 lb 6.4 oz (101.3 kg)  02/01/24 225 lb (102.1 kg)     Review of Systems  Constitutional: Negative.   Respiratory:  Positive for shortness of breath.   Cardiovascular: Negative.     Physical Exam Constitutional:      Appearance: Normal appearance.  Cardiovascular:     Rate and Rhythm: Normal rate. Rhythm irregular.     Pulses: Normal pulses.     Heart sounds: Normal heart sounds.  Pulmonary:     Effort:  Pulmonary effort is normal.     Breath sounds: Normal breath sounds.  Neurological:     General: No focal deficit present.     Mental Status: She is alert.     CBC    Component Value Date/Time   WBC 7.9 01/29/2024 1550   WBC 6.5 10/15/2023 1013   RBC 4.49 01/29/2024 1550   RBC 5.28 (H) 10/15/2023 1013   HGB 10.5 (L) 01/29/2024 1550   HCT 34.9 01/29/2024 1550   PLT 171 01/29/2024 1550   MCV 78 (L) 01/29/2024 1550   MCV 83 08/26/2011 1334   MCH 23.4 (L) 01/29/2024 1550   MCH 25.5 (L) 04/07/2021 1231   MCHC 30.1 (L) 01/29/2024 1550   MCHC 31.3 10/15/2023 1013   RDW 16.0 (H) 01/29/2024 1550   RDW 13.8 08/26/2011 1334   LYMPHSABS 2.1 10/15/2023 1013   MONOABS 0.6 10/15/2023 1013   EOSABS 0.1 10/15/2023 1013   BASOSABS 0.0 10/15/2023 1013    Chest Imaging: 07/29/2018 CTA chest 10 mm right lower lobe pulmonary nodule, 5 mm right upper lobe pulmonary nodule, left upper lobe pulmonary nodule The patient's images have been independently reviewed by me.    June 2020 CT chest: Resolved pulmonary nodule as compared to previous.  New  upper lobe pulmonary nodule.  Recommending 70-month follow-up. The patient's images have been independently reviewed by me.    CT scan of the chest January 2022: Bilateral waxing and waning pulmonary nodules largest left lower lobe approximately 1 cm in size. The patient's images have been independently reviewed by me.    August 2023 CT: Stable lung nodules \  CT chest December 2024-independently reviewed  Nodules seen on prior study have improved or resolved. Largest residual nodule is 6 mm in the left upper lobe compared to 7 mm previously. This nodule is stable dating back to 01/10/2021 compatible with benign nodule.  No acute cardiopulmonary disease.     Pulmonary Functions Testing Results:    Latest Ref Rng & Units 12/18/2018    1:49 PM  PFT Results  FVC-Pre L 1.78   FVC-Predicted Pre % 59   FVC-Post L 1.81   FVC-Predicted Post % 60   Pre FEV1/FVC % % 77   Post FEV1/FCV % % 75   FEV1-Pre L 1.37   FEV1-Predicted Pre % 61   FEV1-Post L 1.36   DLCO uncorrected ml/min/mmHg 14.66   DLCO [facility]% % 74   DLVA Predicted % 98   TLC L 4.26   TLC % Predicted % 83   RV % Predicted % 106     FeNO: None   Pathology: None   Echocardiogram:  10/27/2016: Normal ejection fraction 60 to 65%, mild LVH. 08/16/2018: 1. The left ventricle has normal systolic function, with an ejection fraction of 55-60%. The cavity size was normal. Left ventricular diastolic parameters were normal.  2. The right ventricle has normal systolic function. The cavity was normal. There is no increase in right ventricular wall thickness.  3. Left atrial size was moderately dilated.  4. Right atrial size was mildly dilated.  5. The mitral valve is degenerative. Moderate thickening of the mitral valve leaflet. Mild calcification of the mitral valve leaflet.  6. The tricuspid valve is normal in structure.  7. The aortic valve is tricuspid Moderate thickening of the aortic valve Moderate sclerosis of the aortic  valve.  Heart Catheterization: None     Assessment & Plan:   78 year old morbidly obese white female with underlying diagnosis of  sleep apnea with incidental finding of multiple pulmonary nodules that have been waxing and waning over the last several years, with underlying reactive airways disease contact dermatitis and allergic rhinitis   Assessment of OSA Previous AHI 38 Continue CPAP as prescribed  Excellent compliance report Reviewed compliance report in detail with patient Patient definitely benefits the use of CPAP therapy as prescribed Using CPAP nightly and with naps Pressure setting is comfortable and is sleeping well. CPAP prescription 5-18 AHI reduced to 1.8  No evidence of acute heart failure at this time No respiratory distress No fevers, chills, nausea, vomiting, diarrhea No evidence hemoptysis  Patient Instructions Continue to use CPAP every night, minimum of 4-6 hours a night.  Change equipment every 30 days or as directed by DME.  Wash your tubing with warm soap and water daily, hang to dry.  Wash humidifier portion weekly. Use bottled, distilled water and change daily  Risk of untreated sleep apnea including cardiac arrhthymias, stroke, DM, pulm HTN.   Assessment pulmonary nodules Patient has waxing and waning pulmonary nodules which are considered to be benign given the fact that they have been stable over the last 4 years based on CT scan findings Most recent CT chest shows resolution of nodules At this time I do not recommend follow-up CT scans unless patient has symptoms or feels uncomfortable and wants to assess pulmonary nodules No indication for CT scans at this time  Obesity -recommend significant weight loss -recommend changing diet  Deconditioned state -Recommend increased daily activity and exercise   Underlying reactive airways disease related to obesity Use albuterol  as needed No indication for antibiotics or prednisone  at this  time Patient currently on Dupixent therapy for contact dermatitis which also can treat underlying reactive airways disease and asthma  Atrial Fibrillation - Sleep apnea can contribute to Atrial Fibrillation, therefore treatment of sleep apnea is important part of A fib management. Patient currently on flecainide  is being evaluated for cardiac ablation therapy Patient currently on anticoagulation    Current Outpatient Medications:    albuterol  (VENTOLIN  HFA) 108 (90 Base) MCG/ACT inhaler, Inhale 2 puffs into the lungs every 6 (six) hours as needed for wheezing or shortness of breath., Disp: 8 g, Rfl: 3   alendronate  (FOSAMAX ) 70 MG tablet, TAKE 1 TABLET BY MOUTH IN THE EARLY MORNING ON MONDAY, TAKE WITH A FULL GLASS OF WATER ON AN EMPTY STOMACH, Disp: 13 tablet, Rfl: 11   cetirizine (ZYRTEC) 10 MG tablet, Take 10 mg by mouth daily as needed for allergies. (Patient not taking: Reported on 03/27/2024), Disp: , Rfl:    Cholecalciferol (VITAMIN D3) 50 MCG (2000 UT) capsule, TAKE 2 CAPSULES BY MOUTH DAILY, Disp: 180 capsule, Rfl: 1   diltiazem  (CARDIZEM  CD) 120 MG 24 hr capsule, Take 1 capsule by mouth once daily, Disp: 90 capsule, Rfl: 2   diltiazem  (CARDIZEM ) 30 MG tablet, TAKE ONE TABLET BY MOUTH THREE TIMES DAILY AS NEEDED, Disp: 90 tablet, Rfl: 1   Dupilumab (DUPIXENT) 300 MG/2ML SOPN, Inject 300 mg into the skin every 14 (fourteen) days., Disp: , Rfl:    flecainide  (TAMBOCOR ) 50 MG tablet, Take 1 tablet (50 mg total) by mouth 2 (two) times daily., Disp: 180 tablet, Rfl: 3   furosemide  (LASIX ) 20 MG tablet, Take 1 tablet by mouth once daily, Disp: 90 tablet, Rfl: 3   levothyroxine  (SYNTHROID ) 150 MCG tablet, TAKE 1 TABLET BY MOUTH ON MONDAY THROUGH SATURDAY AND TAKE 2 TABLETS ONCE WEEKLY ON SUNDAY, Disp: 105 tablet, Rfl:  3   loperamide (IMODIUM A-D) 2 MG tablet, Take 2 mg by mouth 2 (two) times daily as needed for diarrhea or loose stools., Disp: , Rfl:    omeprazole  (PRILOSEC) 20 MG capsule,  TAKE ONE (1) CAPSULE BY MOUTH DAILY, Disp: 90 capsule, Rfl: 1   promethazine  (PHENERGAN ) 25 MG tablet, TAKE ONE TABLET BY MOUTH EVERY 6 HOURS AS NEEDED FOR NAUSEA, Disp: 30 tablet, Rfl: 3   propranolol  ER (INDERAL  LA) 120 MG 24 hr capsule, TAKE ONE (1) CAPSULE BY MOUTH AT BEDTIME, Disp: 90 capsule, Rfl: 1   rivaroxaban  (XARELTO ) 20 MG TABS tablet, Take 1 tablet (20 mg total) by mouth daily with supper., Disp: 3 tablet, Rfl: 0   rosuvastatin  (CRESTOR ) 5 MG tablet, Take 1 tablet (5 mg total) by mouth at bedtime., Disp: 90 tablet, Rfl: 3   traMADol  (ULTRAM ) 50 MG tablet, TAKE 1 TABLET BY MOUTH EVERY 12 HOURS AS NEEDED, Disp: 60 tablet, Rfl: 2     MEDICATION ADJUSTMENTS/LABS AND TESTS ORDERED: Continue CPAP Follow-up cardiology Continue Dupixent for contact dermatitis Recommend weight loss Avoid Allergens and Irritants Avoid secondhand smoke Avoid SICK contacts Recommend  Masking  when appropriate Recommend Keep up-to-date with vaccinations  CURRENT MEDICATIONS REVIEWED AT LENGTH WITH PATIENT TODAY   Patient  satisfied with Plan of action and management. All questions answered   Follow up 1 year   I spent a total of 45 minutes dedicated to the care of this patient on the date of this encounter to include pre-visit review of records, face-to-face time with the patient discussing conditions above, post visit ordering of testing, clinical documentation with the electronic health record, making appropriate referrals as documented, and communicating necessary information to the patient's healthcare team.    The Patient requires high complexity decision making for assessment and support, frequent evaluation and titration of therapies, application of advanced monitoring technologies and extensive interpretation of multiple databases.  Patient satisfied with Plan of action and management. All questions answered    Nickolas Alm Cellar, M.D.  [facility]  Medical  Director [facility] [place]

## 2024-05-09 NOTE — Progress Notes (Unsigned)
 Cardiology Clinic Note   Date: 05/13/2024 ID: Malley, Hauter 05/01/1946, MRN [id number]  Primary Cardiologist:  Evalene Lunger, MD  Chief Complaint   Kara Wright is a 78 y.o. female who presents to the clinic today for routine follow up.   Patient Profile   Kara Wright is followed by Dr. Gollan for the history outlined below.      Past medical history significant for: Coronary calcification. Cardiac PET/CT 04/03/2024: Normal, low risk study.  There is no evidence of ischemia or infarction.  Severe coronary calcifications present in the LAD, LCx, and RCA. PAF. 7-day ZIO 12/06/2023: HR 42 to 167 bpm, average 80 bpm.  A-fib continuous 100% burden.  6 patient triggered events associated with A-fib. DCCV 02/01/2024. Echo 02/05/2024: EF 60 to 65%.  Indeterminate diastolic parameters.  Moderately reduced RV function, moderate RVH.  With severely elevated PA pressure, RVSP 72 mmHg.  Severe RAE.  Mild to moderate TR.  Dilated IVC, RA pressure 15 mmHg. Hypertension. Hyperlipidemia. Lipid panel 10/15/2023: LDL 49, HDL 38, TG 148, total 116. TIA. OSA. CPAP with good adherence.  GERD. Hypothyroidism.  In summary, patient has a history of PAF with cardioversion in 2012.  Echo at that time demonstrated EF 55 to 65%, no RWMA, normal diastolic parameters, normal PA pressure.  Patient had a TIA in March 2013.  Carotid ultrasound at that time showed no evidence of stenosis bilateral ICA.  Echo showed normal LV/RV function with no significant valvular abnormalities.  Patient underwent hospital admission in May 2018 for acute hypoxemic respiratory failure secondary to pneumonia.  Echo at that time showed EF 60 to 65%, normal diastolic parameters, mild LVH, mild MR, mild RVH.  LPa 2018 Dr. Barbaraann again contacted Dr. Gollan or related to incidental finding of coronary calcifications on chest CT.  Patient was not symptomatic with chest pain.  It was suggested she undergo nuclear stress testing  as detailed above.  Patient was seen in the office on 03/28/2023 for preop evaluation prior to colonoscopy.  She reported being under some stress at home.  It was recommended she start diltiazem  120 mg in the morning and continue propranolol  in the evening.  No other changes were made.   Patient was seen in the office on 11/16/2023 for routine follow-up.  She was in A-fib at the time of her visit with no cardiac awareness.  She stated at times she felt she was in A-fib she had heaviness in her chest similar to indigestion and felt as though her heart was pounding.  She reported being under an increased amount of stress with her daughter and grandson living in her home.  She reported typically using CPAP although did not use it the night prior secondary to it making a funny sound.  She reported missing doses of Xarelto  at times.  7-day ZIO showed 100% A-fib burden.   Patient was seen in the office 12/28/2023 to follow-up after testing.  She was uncertain if she had missed any doses of Xarelto  so decision was made to defer scheduling cardioversion for another 3 weeks.  Echo was ordered.  Patient contacted the office on 01/16/2024 with complaints of increased DOE.  She denies lower extremity edema.  She described being unable to walk more than 50 feet without becoming winded and presyncopal.  Echo is scheduled for 02/05/2024 and could not be moved up.    Patient was seen in the clinic on 01/29/2024 for evaluation of shortness of breath.  She reported  increased DOE and fatigue.  She remained in A-fib HR 74 bpm.  She was scheduled for DCCV which she underwent on 02/01/2024.  Patient contacted the office on 02/04/2024 with complaints of shortness of breath.  Per triage RN Patient reports that after her Cardioversion on Friday she felt fine but starting on Saturday she did not feel well. She started to experience SOB when ambulating and at rest. Overall she reports not feeling well and is just not back to normal.  Symptoms occurred after the DCCV so given her new onset SOB scheduled her to come in to be seen at her earliest convenience. She was appreciative with no further needs at this time.  Echo on 02/05/2024 demonstrated severely elevated PA pressure.  Patient was contacted and started on Lasix  20 mg daily.  She was seen in follow-up on 02/11/2024 and reported somewhat improved lower extremity edema since starting Lasix .  Labs were drawn and she was contacted the following day with instructions to increase Lasix  to 40 mg once a day x 5 days before resuming 20 mg daily.  Patient was last seen in the office by Elvie Needle on 03/27/2024 for EP visit.  She underwent cardiac PET stress which was a normal, low risk study.  She was started on flecainide  and scheduled for A-fib ablation in January 2026.     History of Present Illness    Today, patient reports continued DOE and fatigue. No palpitations. She endorses dyspnea with minimal activity such as walking down the hall to the rest room at work. No lower extremity edema, orthopnea or PND. She denies chest pain, pressure or tightness. She does feel Lasix  helped improve her dyspnea but is unsure if it is still making a difference. She reports brisk diuresis on current dose. She is frustrated with her continued symptoms.  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 Her activity is limited secondary to dyspnea and fatigue.     ROS: All other systems reviewed and are otherwise negative except as noted in History of Present Illness.  EKGs/Labs Reviewed    EKG Interpretation Date/Time:  Tuesday May 13 2024 14:31:36 EST Ventricular Rate:  79 PR Interval:    QRS Duration:  88 QT Interval:  376 QTC Calculation: 431 R Axis:   98  Text Interpretation: Atrial fibrillation with premature ventricular or aberrantly conducted complexes Rightward axis Low voltage QRS When compared with ECG of 11-Feb-2024 15:10, Nonspecific T wave abnormality  no longer evident in Inferior leads Confirmed by Loistine Sober [phone]) on 05/13/2024 2:32:50 PM   10/15/2023: ALT 9; AST 16 02/11/2024: BUN 9; Creatinine, Ser 0.89; Potassium 4.4; Sodium 142   01/29/2024: Hemoglobin 10.5; WBC 7.9   10/15/2023: TSH 0.77    Risk Assessment/Calculations     CHA2DS2-VASc Score = 6   This indicates a 9.7% annual risk of stroke. The patient's score is based upon: CHF History: 0 HTN History: 1 Diabetes History: 0 Stroke History: 2 Vascular Disease History: 0 Age Score: 2 Gender Score: 1             Physical Exam    VS:  BP 126/84   Pulse 79   Ht 5' 4 (1.626 m)   Wt 228 lb 3.2 oz (103.5 kg)   SpO2 90%   BMI 39.17 kg/m  , BMI Body mass index is 39.17 kg/m.  GEN: Well nourished, well developed, in no acute distress. Neck: No JVD or carotid bruits. Cardiac: Irregularly irregular rhythm.  No murmur. No rubs or gallops.   Respiratory:  Respirations regular and unlabored. Clear to auscultation without rales, wheezing or rhonchi. GI: Soft, nontender, nondistended. Extremities: Radials/DP/PT 2+ and equal bilaterally. No clubbing or cyanosis. No edema  Skin: Warm and dry, no rash. Neuro: Strength intact.  Assessment & Plan   Coronary artery calcification Cardiac PET/CT October 2025 was a normal, low risk study with no evidence of ischemia or infarction, severe coronary calcifications present in the LAD, LCx, and RCA.  Patient denies chest pain, pressure or tightness. Activity is limited secondary to dsypnea.  - Continue rosuvastatin , propranolol .  Not on aspirin  secondary to Xarelto .   PAF 7-day ZIO June 2025 demonstrated 100% A-fib burden, HR 42 to 167 bpm average 80 bpm.  DCCV 02/01/2024.  Echo 02/05/2024 demonstrated EF 60 to 65%.  Moderately reduced RV function, mild RVH, severely elevated PA pressure, severe RAE, mild to moderate TR. She is pending A-fib ablation in January 2026.  Denies spontaneous bleeding concerns. She does not  experience palpitations but knows she is in afib secondary to dyspnea with minimal exertion and fatigue. She has not missed any doses of Xarelto . Irregularly irregular on exam today.  - Continue flecainide , diltiazem , propranolol , as needed immediate release diltiazem , Xarelto . - Keep previously scheduled visit with Dr. Kennyth 05/20/2024.   Chronic HFpEF/pulmonary hypertension  Echo August 2025 showed EF 60 to 65%, moderately reduced RV function, mild RVH, severely elevated PA pressure, severe RAE. Patient reports dyspnea with minimal exertion. No lower extremity edema, orthopnea or PND. She feels Lasix  initially helped improve her breathing but is not sure if it is still helping. She reports brisk diuresis on current dose. Discussed continuing current dose of Lasix  until after ablation then could consider taking it as needed and she agrees. She is euvolemic and well compensated on exam today.   - BMP today. - Continue propranolol , Lasix .   Hypertension BP today 126/84. No headaches or dizziness.  - Continue propranolol , diltiazem .   Hyperlipidemia LDL 49 April 2025, at goal. - Continue rosuvastatin .  OSA Patient reports compliance with CPAP.  - Encouraged continued use of CPAP.   Disposition: CBC and BMP today. Keep previously scheduled visit with Dr. Kennyth 12/2. Return in 8 months or sooner as needed.          Signed, Barnie HERO. Yael Coppess, DNP, NP-C

## 2024-05-13 ENCOUNTER — Encounter: Payer: Self-pay | Admitting: Student

## 2024-05-13 ENCOUNTER — Ambulatory Visit: Attending: Student | Admitting: Student

## 2024-05-13 VITALS — BP 126/84 | HR 79 | Ht 64.0 in | Wt 228.2 lb

## 2024-05-13 DIAGNOSIS — I272 Pulmonary hypertension, unspecified: Secondary | ICD-10-CM

## 2024-05-13 DIAGNOSIS — I5032 Chronic diastolic (congestive) heart failure: Secondary | ICD-10-CM | POA: Diagnosis not present

## 2024-05-13 DIAGNOSIS — I10 Essential (primary) hypertension: Secondary | ICD-10-CM

## 2024-05-13 DIAGNOSIS — G4733 Obstructive sleep apnea (adult) (pediatric): Secondary | ICD-10-CM | POA: Diagnosis not present

## 2024-05-13 DIAGNOSIS — Z79899 Other long term (current) drug therapy: Secondary | ICD-10-CM

## 2024-05-13 DIAGNOSIS — I25118 Atherosclerotic heart disease of native coronary artery with other forms of angina pectoris: Secondary | ICD-10-CM | POA: Diagnosis not present

## 2024-05-13 DIAGNOSIS — I4819 Other persistent atrial fibrillation: Secondary | ICD-10-CM

## 2024-05-13 DIAGNOSIS — I2510 Atherosclerotic heart disease of native coronary artery without angina pectoris: Secondary | ICD-10-CM | POA: Diagnosis not present

## 2024-05-13 NOTE — Patient Instructions (Signed)
 Medication Instructions:  Your physician recommends that you continue on your current medications as directed. Please refer to the Current Medication list given to you today.  *If you need a refill on your cardiac medications before your next appointment, please call your pharmacy*  Lab Work: TODAY: CBC, BMET If you have labs (blood work) drawn today and your tests are completely normal, you will receive your results only by: MyChart Message (if you have MyChart) OR A paper copy in the mail If you have any lab test that is abnormal or we need to change your treatment, we will call you to review the results.  Testing/Procedures: NONE  Follow-Up: At [facility], you and your health needs are our priority.  As part of our continuing mission to provide you with exceptional heart care, our providers are all part of one team.  This team includes your primary Cardiologist (physician) and Advanced Practice Providers or APPs (Physician Assistants and Nurse Practitioners) who all work together to provide you with the care you need, when you need it.  Your next appointment:   8 month(s)  Provider:   Timothy Gollan, MD or Barnie Hila, NP

## 2024-05-14 ENCOUNTER — Ambulatory Visit: Payer: Self-pay | Admitting: Student

## 2024-05-14 LAB — BASIC METABOLIC PANEL WITH GFR
BUN/Creatinine Ratio: 14 (ref 12–28)
BUN: 12 mg/dL (ref 8–27)
CO2: 29 mmol/L (ref 20–29)
Calcium: 9.4 mg/dL (ref 8.7–10.3)
Chloride: 100 mmol/L (ref 96–106)
Creatinine, Ser: 0.85 mg/dL (ref 0.57–1.00)
Glucose: 91 mg/dL (ref 70–99)
Potassium: 4.5 mmol/L (ref 3.5–5.2)
Sodium: 141 mmol/L (ref 134–144)
eGFR: 71 mL/min/1.73 (ref >59)

## 2024-05-14 LAB — CBC
Hematocrit: 35 % (ref 34.0–46.6)
Hemoglobin: 10.1 g/dL — ABNORMAL LOW (ref 11.1–15.9)
MCH: 21.1 pg — ABNORMAL LOW (ref 26.6–33.0)
MCHC: 28.9 g/dL — ABNORMAL LOW (ref 31.5–35.7)
MCV: 73 fL — ABNORMAL LOW (ref 79–97)
Platelets: 186 x10E3/uL (ref 150–450)
RBC: 4.79 x10E6/uL (ref 3.77–5.28)
RDW: 16.3 % — ABNORMAL HIGH (ref 11.7–15.4)
WBC: 8.5 x10E3/uL (ref 3.4–10.8)

## 2024-05-20 ENCOUNTER — Encounter: Payer: Self-pay | Admitting: Urology

## 2024-05-20 ENCOUNTER — Telehealth: Payer: Self-pay

## 2024-05-20 ENCOUNTER — Ambulatory Visit: Admitting: Urology

## 2024-05-20 ENCOUNTER — Ambulatory Visit: Attending: Cardiology | Admitting: Cardiology

## 2024-05-20 ENCOUNTER — Encounter: Payer: Self-pay | Admitting: Cardiology

## 2024-05-20 VITALS — BP 134/60 | HR 70 | Ht 64.0 in | Wt 224.4 lb

## 2024-05-20 DIAGNOSIS — Z79899 Other long term (current) drug therapy: Secondary | ICD-10-CM

## 2024-05-20 DIAGNOSIS — G4733 Obstructive sleep apnea (adult) (pediatric): Secondary | ICD-10-CM | POA: Diagnosis not present

## 2024-05-20 DIAGNOSIS — I4819 Other persistent atrial fibrillation: Secondary | ICD-10-CM | POA: Diagnosis not present

## 2024-05-20 DIAGNOSIS — D6869 Other thrombophilia: Secondary | ICD-10-CM | POA: Diagnosis not present

## 2024-05-20 DIAGNOSIS — I272 Pulmonary hypertension, unspecified: Secondary | ICD-10-CM | POA: Diagnosis not present

## 2024-05-20 NOTE — Telephone Encounter (Signed)
-----   Message from Nurse Carlyle C sent at 04/10/2024 10:14 AM EDT ----- Regarding: 06/23/24 afib ablation Precert:  MD: Kennyth Type of ablation: A-fib Diagnosis: A-fib CPT code: A-fib (06343) Ablation scheduled (date/time): 06/23/24  Procedure:  Added to calendar? Yes Orders entered? Yes Letter complete? No, >30 days before procedure Scheduled with cath lab? Yes Any medications to hold? No Labs ordered (CBC, BMET, PT/INR if on warfarin): No Mapping system: Doesn't matter CARTO/OPAL rep notified? No Cardiac CT needed? TBD Dye allergy ? No Pre-meds ordered and instructions given? No, not needed Letter method: pick up at appointment 12/2 H&P: 12/2  Device: No  Follow-up:  Cassie/Angel, please schedule Routine.  Covering RN - please send this message to [employer], EP scheduler, EP Scheduling pool, EP [employer], and CT scheduler (Brittany Lynch/Stephanie Mogg), if indicated.

## 2024-05-20 NOTE — Progress Notes (Unsigned)
 Electrophysiology Office Note:   Date:  05/22/2024  ID:  Tanylah Schnoebelen, DOB [birth year], MRN [id number]  Primary Cardiologist: Evalene Lunger, MD Electrophysiologist: Fonda Kitty, MD      History of Present Illness:   Kara Wright is a 78 y.o. female with h/o persis AFib, HTN, pulmHTN, HLD, OSA on CPAP  who is being seen today for   Discussed the use of AI scribe software for clinical note transcription with the patient, who gave verbal consent to proceed.  History of Present Illness Micki Cassel is a 78 year old female with atrial fibrillation who presents for evaluation and discussion of a possible ablation procedure. She is accompanied by her sister. She was referred by Elvie Needle, a nurse practitioner, for evaluation of atrial fibrillation and consideration of an ablation procedure.  She has a history of atrial fibrillation and previously underwent a cardioversion, which was unsuccessful in maintaining normal heart rhythm. She feels worse in AF, primarily DOE and SOB. Currently taking flecainide .  She has been diagnosed with pulmonary hypertension, indicating high pressures on the right side of the heart. She has not undergone any recent surgeries under general anesthesia, with her last significant procedure being a hand surgery a long time ago. She also mentioned having a colonoscopy, which may have involved light sedation.  She experiences shortness of breath, which may be related to her pulmonary hypertension.   Review of systems complete and found to be negative unless listed in HPI.   EP Information / Studies Reviewed:    EKG is not ordered today. EKG from 05/13/24 reviewed which showed AF     Zio 12/06/23:   Nuclear Stress 04/03/24:    LV perfusion is normal. There is no evidence of ischemia. There is no evidence of infarction. RV radiotracer uptake noted, consistent with pulmonary hypertension.   Rest left ventricular function is normal. Rest EF: 52%.  Stress left ventricular function is normal. Stress EF: 58%. End diastolic cavity size is normal.   Myocardial blood flow was computed to be 0.78ml/g/min at rest and 1.65ml/g/min at stress. Global myocardial blood flow reserve was 1.70 and was mildly abnormal.   Coronary calcium  was present on the attenuation correction CT images. Severe coronary calcifications were present. Coronary calcifications were present in the left anterior descending artery, left circumflex artery and right coronary artery distribution(s).   The study is normal. The study is low risk.  Echo 02/05/24:  1. Left ventricular ejection fraction, by estimation, is 60 to 65%. The  left ventricle has normal function. Left ventricular diastolic parameters  are indeterminate.   2. Right ventricular systolic function is moderately reduced. The right  ventricular size is moderately enlarged. There is severely elevated  pulmonary artery systolic pressure. Estimated PASP of .   3. Right atrial size was severely dilated.   4. Tricuspid valve regurgitation is mild to moderate.   5. The inferior vena cava is dilated in size with <50% respiratory  variability, suggesting right atrial pressure of 15 mmHg.   6. When compared to prior study dated 08/16/2018, the RV function appears  worse and there is now evidence of severely elevated PA pressures.   Risk Assessment/Calculations:    CHA2DS2-VASc Score = 6   This indicates a 9.7% annual risk of stroke. The patient's score is based upon: CHF History: 0 HTN History: 1 Diabetes History: 0 Stroke History: 2 Vascular Disease History: 0 Age Score: 2 Gender Score: 1  Physical Exam:   VS:  BP 134/60 (BP Location: Left Arm, Patient Position: Sitting, Cuff Size: Large)   Pulse 70   Ht 5' 4 (1.626 m)   Wt 224 lb 6.4 oz (101.8 kg)   SpO2 95%   BMI 38.52 kg/m    Wt Readings from Last 3 Encounters:  05/20/24 224 lb 6.4 oz (101.8 kg)  05/13/24 228 lb 3.2 oz (103.5 kg)   05/07/24 222 lb (100.7 kg)    General: Well developed, in no acute distress.  Neck: No JVD.  Cardiac: Normal rate, irregular rhythm.  Resp: Normal work of breathing.  Ext: No edema.  Neuro: No gross focal deficits.  Psych: Normal affect.   ASSESSMENT AND PLAN:    #Persistent atrial fibrillation: Symptomatic despite rate control.  #High risk medication use: Flecainide . QRS normal on last ECG. Unable to assess PR d/t AF. -Discussed treatment options today for AF including changing antiarrhythmic drug therapy and ablation. Discussed risks, recovery and likelihood of success with each treatment strategy. Risk, benefits, and alternatives to EP study and ablation for afib were discussed. These risks include but are not limited to stroke, bleeding, vascular damage, tamponade, perforation, damage to the esophagus, lungs, phrenic nerve and other structures, pulmonary vein stenosis, worsening renal function, coronary vasospasm and death.  Discussed potential need for repeat ablation procedures and antiarrhythmic drugs after an initial ablation. The patient understands these risk and wishes to proceed.  We will therefore proceed with catheter ablation at the next available time.  Carto, ICE, anesthesia are requested for the procedure.   -Increase flecainide  to 100mg  BID. Repeat ECG 7 days later. If still in AF then perform DCCV. -Continue diltiazem  120mg  daily.  #Hypercoagulable state due to AF: -Continue Xarelto . Denies bleeding issues.  #Pulmonary hypertension: Likely secondary to OSA. Well compensated on exam today. - RHC to assess severity prior to general anesthesia and transseptal puncture.  #OSA: - Encouraged compliance with CPAP. Educated on increased risk of recurrence of AF with untreated OSA.  Follow up with EP Team 3 months after ablation.   Signed, Fonda Kitty, MD

## 2024-05-20 NOTE — Patient Instructions (Signed)
 Medication Instructions:  Your physician recommends that you continue on your current medications as directed. Please refer to the Current Medication list given to you today.  *If you need a refill on your cardiac medications before your next appointment, please call your pharmacy*  Lab Work: BMET and CBC  Testing/Procedures: Heart Catheterization Your physician has requested that you have a cardiac catheterization. Cardiac catheterization is used to diagnose and/or treat various heart conditions. Doctors may recommend this procedure for a number of different reasons. The most common reason is to evaluate chest pain. Chest pain can be a symptom of coronary artery disease (CAD), and cardiac catheterization can show whether plaque is narrowing or blocking your heart's arteries. This procedure is also used to evaluate the valves, as well as measure the blood flow and oxygen  levels in different parts of your heart. For further information please visit [email or web address] Please follow instruction sheet, as given.  Ablation  Your physician has recommended that you have an ablation. Catheter ablation is a medical procedure used to treat some cardiac arrhythmias (irregular heartbeats). During catheter ablation, a long, thin, flexible tube is put into a blood vessel in your groin (upper thigh), or neck. This tube is called an ablation catheter. It is then guided to your heart through the blood vessel. Radio frequency waves destroy small areas of heart tissue where abnormal heartbeats may cause an arrhythmia to start. Please see the instruction sheet given to you today.  Follow-Up: At [facility], you and your health needs are our priority.  As part of our continuing mission to provide you with exceptional heart care, our providers are all part of one team.  This team includes your primary Cardiologist (physician) and Advanced Practice Providers or APPs (Physician Assistants and Nurse  Practitioners) who all work together to provide you with the care you need, when you need it.  Your next appointment:   We will contact you to schedule your post-procedure follow up appointments.

## 2024-05-20 NOTE — H&P (View-Only) (Signed)
 Electrophysiology Office Note:   Date:  05/22/2024  ID:  Kara Wright, DOB [birth year], MRN [id number]  Primary Cardiologist: Evalene Lunger, MD Electrophysiologist: Fonda Kitty, MD      History of Present Illness:   Kara Wright is a 78 y.o. female with h/o persis AFib, HTN, pulmHTN, HLD, OSA on CPAP  who is being seen today for   Discussed the use of AI scribe software for clinical note transcription with the patient, who gave verbal consent to proceed.  History of Present Illness Kara Wright is a 78 year old female with atrial fibrillation who presents for evaluation and discussion of a possible ablation procedure. She is accompanied by her sister. She was referred by Elvie Needle, a nurse practitioner, for evaluation of atrial fibrillation and consideration of an ablation procedure.  She has a history of atrial fibrillation and previously underwent a cardioversion, which was unsuccessful in maintaining normal heart rhythm. She feels worse in AF, primarily DOE and SOB. Currently taking flecainide .  She has been diagnosed with pulmonary hypertension, indicating high pressures on the right side of the heart. She has not undergone any recent surgeries under general anesthesia, with her last significant procedure being a hand surgery a long time ago. She also mentioned having a colonoscopy, which may have involved light sedation.  She experiences shortness of breath, which may be related to her pulmonary hypertension.   Review of systems complete and found to be negative unless listed in HPI.   EP Information / Studies Reviewed:    EKG is not ordered today. EKG from 05/13/24 reviewed which showed AF     Zio 12/06/23:   Nuclear Stress 04/03/24:    LV perfusion is normal. There is no evidence of ischemia. There is no evidence of infarction. RV radiotracer uptake noted, consistent with pulmonary hypertension.   Rest left ventricular function is normal. Rest EF: 52%.  Stress left ventricular function is normal. Stress EF: 58%. End diastolic cavity size is normal.   Myocardial blood flow was computed to be 0.78ml/g/min at rest and 1.65ml/g/min at stress. Global myocardial blood flow reserve was 1.70 and was mildly abnormal.   Coronary calcium  was present on the attenuation correction CT images. Severe coronary calcifications were present. Coronary calcifications were present in the left anterior descending artery, left circumflex artery and right coronary artery distribution(s).   The study is normal. The study is low risk.  Echo 02/05/24:  1. Left ventricular ejection fraction, by estimation, is 60 to 65%. The  left ventricle has normal function. Left ventricular diastolic parameters  are indeterminate.   2. Right ventricular systolic function is moderately reduced. The right  ventricular size is moderately enlarged. There is severely elevated  pulmonary artery systolic pressure. Estimated PASP of .   3. Right atrial size was severely dilated.   4. Tricuspid valve regurgitation is mild to moderate.   5. The inferior vena cava is dilated in size with <50% respiratory  variability, suggesting right atrial pressure of 15 mmHg.   6. When compared to prior study dated 08/16/2018, the RV function appears  worse and there is now evidence of severely elevated PA pressures.   Risk Assessment/Calculations:    CHA2DS2-VASc Score = 6   This indicates a 9.7% annual risk of stroke. The patient's score is based upon: CHF History: 0 HTN History: 1 Diabetes History: 0 Stroke History: 2 Vascular Disease History: 0 Age Score: 2 Gender Score: 1  Physical Exam:   VS:  BP 134/60 (BP Location: Left Arm, Patient Position: Sitting, Cuff Size: Large)   Pulse 70   Ht 5' 4 (1.626 m)   Wt 224 lb 6.4 oz (101.8 kg)   SpO2 95%   BMI 38.52 kg/m    Wt Readings from Last 3 Encounters:  05/20/24 224 lb 6.4 oz (101.8 kg)  05/13/24 228 lb 3.2 oz (103.5 kg)   05/07/24 222 lb (100.7 kg)    General: Well developed, in no acute distress.  Neck: No JVD.  Cardiac: Normal rate, irregular rhythm.  Resp: Normal work of breathing.  Ext: No edema.  Neuro: No gross focal deficits.  Psych: Normal affect.   ASSESSMENT AND PLAN:    #Persistent atrial fibrillation: Symptomatic despite rate control.  #High risk medication use: Flecainide . QRS normal on last ECG. Unable to assess PR d/t AF. -Discussed treatment options today for AF including changing antiarrhythmic drug therapy and ablation. Discussed risks, recovery and likelihood of success with each treatment strategy. Risk, benefits, and alternatives to EP study and ablation for afib were discussed. These risks include but are not limited to stroke, bleeding, vascular damage, tamponade, perforation, damage to the esophagus, lungs, phrenic nerve and other structures, pulmonary vein stenosis, worsening renal function, coronary vasospasm and death.  Discussed potential need for repeat ablation procedures and antiarrhythmic drugs after an initial ablation. The patient understands these risk and wishes to proceed.  We will therefore proceed with catheter ablation at the next available time.  Carto, ICE, anesthesia are requested for the procedure.   -Increase flecainide  to 100mg  BID. Repeat ECG 7 days later. If still in AF then perform DCCV. -Continue diltiazem  120mg  daily.  #Hypercoagulable state due to AF: -Continue Xarelto . Denies bleeding issues.  #Pulmonary hypertension: Likely secondary to OSA. Well compensated on exam today. - RHC to assess severity prior to general anesthesia and transseptal puncture.  #OSA: - Encouraged compliance with CPAP. Educated on increased risk of recurrence of AF with untreated OSA.  Follow up with EP Team 3 months after ablation.   Signed, Fonda Kitty, MD

## 2024-05-20 NOTE — Telephone Encounter (Signed)
 No CT requested.

## 2024-05-22 ENCOUNTER — Ambulatory Visit: Payer: Self-pay | Admitting: Urology

## 2024-05-26 ENCOUNTER — Telehealth: Payer: Self-pay

## 2024-05-26 MED ORDER — FLECAINIDE ACETATE 100 MG PO TABS: 100.0000 mg | ORAL_TABLET | Freq: Two times a day (BID) | ORAL | 3 refills | Status: AC

## 2024-05-26 NOTE — Telephone Encounter (Signed)
-----   Message from Fonda Kitty sent at 05/22/2024 10:45 PM EST ----- Regarding: Flecainide  and DCCV Since we have until January 5 for ablation, can we have patient increase flecainide  to 100 mg twice daily? Then can we make sure she has a twelve-lead ECG done when she comes in for right heart cath, which is scheduled for 12/15.  If she remains in AF at time of right heart cath then we may elect to proceed with cardioversion prior to ablation.  Josh

## 2024-05-26 NOTE — Telephone Encounter (Signed)
 Spoke with the patient and made her aware of recommendations from Dr. Kennyth. Patient verbalized understanding. Prescription has been sent in.

## 2024-05-30 ENCOUNTER — Encounter (HOSPITAL_COMMUNITY): Payer: Self-pay

## 2024-05-30 ENCOUNTER — Other Ambulatory Visit
Admission: RE | Admit: 2024-05-30 | Discharge: 2024-05-30 | Disposition: A | Attending: Cardiology | Admitting: Cardiology

## 2024-05-30 ENCOUNTER — Other Ambulatory Visit (HOSPITAL_COMMUNITY): Payer: Self-pay

## 2024-05-30 ENCOUNTER — Telehealth (HOSPITAL_COMMUNITY): Payer: Self-pay

## 2024-05-30 DIAGNOSIS — I4819 Other persistent atrial fibrillation: Secondary | ICD-10-CM

## 2024-05-30 DIAGNOSIS — I272 Pulmonary hypertension, unspecified: Secondary | ICD-10-CM

## 2024-05-30 DIAGNOSIS — Z79899 Other long term (current) drug therapy: Secondary | ICD-10-CM

## 2024-05-30 LAB — BASIC METABOLIC PANEL WITH GFR
Anion gap: 12 (ref 5–15)
BUN: 14 mg/dL (ref 8–23)
CO2: 29 mmol/L (ref 22–32)
Calcium: 8.8 mg/dL — ABNORMAL LOW (ref 8.9–10.3)
Chloride: 100 mmol/L (ref 98–111)
Creatinine, Ser: 0.8 mg/dL (ref 0.44–1.00)
GFR, Estimated: >60 mL/min (ref >60)
Glucose, Bld: 95 mg/dL (ref 70–99)
Potassium: 4.4 mmol/L (ref 3.5–5.1)
Sodium: 140 mmol/L (ref 135–145)

## 2024-05-30 NOTE — Telephone Encounter (Signed)
 Spoke with patient to discuss upcoming procedure.   CT: not required.  Labs: to be completed.   Any recent signs of acute illness or been started on antibiotics? No Any new medications started?No Any medications to hold?  No Any missed doses of blood thinner?  No Advised patient to continue taking Xarelto  (Rivaroxaban ) once daily without missing any doses.  Medication instructions:  On the morning of your procedure DO NOT take any medication., including Xarelto  (Rivaroxaban ) or the procedure may be rescheduled. Nothing to eat or drink after midnight prior to your procedure.  Confirmed patient is scheduled for Atrial Fibrillation Ablation on Monday, January 5 with Dr. Kennyth. Instructed patient to arrive at the Main Entrance A at [facility]: [address] [place], [place] [address] and check in at Admitting at 7:30 AM.   Plan to go home the same day, you will only stay overnight if medically necessary. You MUST have a responsible adult to drive you home and MUST be with you the first 24 hours after you arrive home or your procedure could be cancelled.  Informed patient a nurse will call a day before the procedure to confirm arrival time and ensure instructions are followed.  Patient verbalized understanding to all instructions provided and agreed to proceed with procedure.   Advised patient to contact RN Navigator at [phone], to inform of any new medications started after call or concerns prior to procedure.

## 2024-05-31 ENCOUNTER — Ambulatory Visit: Payer: Self-pay | Admitting: Student

## 2024-05-31 LAB — CBC
HCT: 36.8 % (ref 36.0–46.0)
Hemoglobin: 10.3 g/dL — ABNORMAL LOW (ref 12.0–15.0)
MCH: 20.8 pg — ABNORMAL LOW (ref 26.0–34.0)
MCHC: 28 g/dL — ABNORMAL LOW (ref 30.0–36.0)
MCV: 74.2 fL — ABNORMAL LOW (ref 80.0–100.0)
Platelets: 148 K/uL — ABNORMAL LOW (ref 150–400)
RBC: 4.96 MIL/uL (ref 3.87–5.11)
RDW: 17.2 % — ABNORMAL HIGH (ref 11.5–15.5)
WBC: 8.9 K/uL (ref 4.0–10.5)
nRBC: 0 % (ref 0.0–0.2)

## 2024-06-02 ENCOUNTER — Ambulatory Visit
Admission: RE | Admit: 2024-06-02 | Discharge: 2024-06-02 | Disposition: A | Attending: Cardiovascular Disease | Admitting: Cardiovascular Disease

## 2024-06-02 ENCOUNTER — Other Ambulatory Visit: Payer: Self-pay

## 2024-06-02 ENCOUNTER — Encounter: Admission: RE | Disposition: A | Payer: Self-pay | Attending: Cardiovascular Disease

## 2024-06-02 ENCOUNTER — Encounter: Payer: Self-pay | Admitting: Cardiovascular Disease

## 2024-06-02 DIAGNOSIS — G4733 Obstructive sleep apnea (adult) (pediatric): Secondary | ICD-10-CM | POA: Diagnosis not present

## 2024-06-02 DIAGNOSIS — I4819 Other persistent atrial fibrillation: Secondary | ICD-10-CM | POA: Insufficient documentation

## 2024-06-02 DIAGNOSIS — Z79899 Other long term (current) drug therapy: Secondary | ICD-10-CM | POA: Diagnosis not present

## 2024-06-02 DIAGNOSIS — I272 Pulmonary hypertension, unspecified: Secondary | ICD-10-CM | POA: Diagnosis present

## 2024-06-02 DIAGNOSIS — Z8673 Personal history of transient ischemic attack (TIA), and cerebral infarction without residual deficits: Secondary | ICD-10-CM | POA: Insufficient documentation

## 2024-06-02 DIAGNOSIS — D6869 Other thrombophilia: Secondary | ICD-10-CM | POA: Insufficient documentation

## 2024-06-02 DIAGNOSIS — Z7901 Long term (current) use of anticoagulants: Secondary | ICD-10-CM | POA: Diagnosis not present

## 2024-06-02 DIAGNOSIS — I10 Essential (primary) hypertension: Secondary | ICD-10-CM | POA: Insufficient documentation

## 2024-06-02 HISTORY — PX: RIGHT HEART CATH: CATH118263

## 2024-06-02 LAB — POCT I-STAT EG7
Acid-Base Excess: 3 mmol/L — ABNORMAL HIGH (ref 0.0–2.0)
Acid-Base Excess: 3 mmol/L — ABNORMAL HIGH (ref 0.0–2.0)
Bicarbonate: 30 mmol/L — ABNORMAL HIGH (ref 20.0–28.0)
Bicarbonate: 30.5 mmol/L — ABNORMAL HIGH (ref 20.0–28.0)
Calcium, Ion: 1.25 mmol/L (ref 1.15–1.40)
Calcium, Ion: 1.25 mmol/L (ref 1.15–1.40)
HCT: 30 % — ABNORMAL LOW (ref 36.0–46.0)
HCT: 30 % — ABNORMAL LOW (ref 36.0–46.0)
Hemoglobin: 10.2 g/dL — ABNORMAL LOW (ref 12.0–15.0)
Hemoglobin: 10.2 g/dL — ABNORMAL LOW (ref 12.0–15.0)
O2 Saturation: 45 %
O2 Saturation: 48 %
Potassium: 4 mmol/L (ref 3.5–5.1)
Potassium: 4 mmol/L (ref 3.5–5.1)
Sodium: 141 mmol/L (ref 135–145)
Sodium: 141 mmol/L (ref 135–145)
TCO2: 32 mmol/L (ref 22–32)
TCO2: 32 mmol/L (ref 22–32)
pCO2, Ven: 58.4 mmHg (ref 44–60)
pCO2, Ven: 60.2 mmHg — ABNORMAL HIGH (ref 44–60)
pH, Ven: 7.313 (ref 7.25–7.43)
pH, Ven: 7.319 (ref 7.25–7.43)
pO2, Ven: 28 mmHg — CL (ref 32–45)
pO2, Ven: 29 mmHg — CL (ref 32–45)

## 2024-06-02 SURGERY — RIGHT HEART CATH
Anesthesia: Moderate Sedation | Laterality: Right

## 2024-06-02 MED ORDER — SODIUM CHLORIDE 0.9 % IV SOLN
250.0000 mL | INTRAVENOUS | Status: DC | PRN
  Administered 2024-06-02: 09:00:00 250 mL via INTRAVENOUS

## 2024-06-02 MED ORDER — MIDAZOLAM HCL (PF) 2 MG/2ML IJ SOLN
INTRAMUSCULAR | Status: DC | PRN
  Administered 2024-06-02: 10:00:00 1 mg via INTRAVENOUS

## 2024-06-02 MED ORDER — SODIUM CHLORIDE 0.9% FLUSH: 3.0000 mL | INTRAVENOUS | Status: DC | PRN

## 2024-06-02 MED ORDER — LIDOCAINE HCL 1 % IJ SOLN
INTRAMUSCULAR | Status: AC
  Filled 2024-06-02: qty 20

## 2024-06-02 MED ORDER — HEPARIN (PORCINE) IN NACL 1000-0.9 UT/500ML-% IV SOLN
INTRAVENOUS | Status: DC | PRN
  Administered 2024-06-02: 10:00:00 1000 mL

## 2024-06-02 MED ORDER — HEPARIN (PORCINE) IN NACL 1000-0.9 UT/500ML-% IV SOLN
INTRAVENOUS | Status: AC
  Filled 2024-06-02: qty 1000

## 2024-06-02 MED ORDER — ASPIRIN 81 MG PO CHEW: 81.0000 mg | CHEWABLE_TABLET | ORAL | Status: DC

## 2024-06-02 MED ORDER — FREE WATER: 500.0000 mL | Freq: Once | Status: DC

## 2024-06-02 MED ORDER — MIDAZOLAM HCL 2 MG/2ML IJ SOLN
INTRAMUSCULAR | Status: AC
  Filled 2024-06-02: qty 2

## 2024-06-02 MED ORDER — SODIUM CHLORIDE 0.9% FLUSH: 3.0000 mL | Freq: Two times a day (BID) | INTRAVENOUS | Status: DC

## 2024-06-02 MED ORDER — ACETAMINOPHEN 325 MG PO TABS: 650.0000 mg | ORAL_TABLET | ORAL | Status: DC | PRN

## 2024-06-02 MED ORDER — LIDOCAINE HCL (PF) 1 % IJ SOLN
INTRAMUSCULAR | Status: DC | PRN
  Administered 2024-06-02: 10:00:00 2 mL

## 2024-06-02 MED ORDER — ONDANSETRON HCL 4 MG/2ML IJ SOLN: 4.0000 mg | Freq: Four times a day (QID) | INTRAMUSCULAR | Status: DC | PRN

## 2024-06-02 MED ORDER — SODIUM CHLORIDE 0.9 % IV SOLN: 250.0000 mL | INTRAVENOUS | Status: DC | PRN

## 2024-06-02 MED ORDER — FENTANYL CITRATE (PF) 100 MCG/2ML IJ SOLN
INTRAMUSCULAR | Status: AC
  Filled 2024-06-02: qty 2

## 2024-06-02 SURGICAL SUPPLY — 7 items
CATH BALLN WEDGE 5F 110CM (CATHETERS) IMPLANT
DRAPE BRACHIAL (DRAPES) IMPLANT
KIT SYRINGE INJ CVI SPIKEX1 (MISCELLANEOUS) IMPLANT
PACK CARDIAC CATH (CUSTOM PROCEDURE TRAY) ×1 IMPLANT
SET ATX-X65L (MISCELLANEOUS) IMPLANT
SHEATH GLIDE SLENDER 4/5FR (SHEATH) IMPLANT
STATION PROTECTION PRESSURIZED (MISCELLANEOUS) IMPLANT

## 2024-06-02 NOTE — Interval H&P Note (Signed)
 History and Physical Interval Note:  06/02/2024 9:53 AM  Kara Wright  has presented today for surgery, with the diagnosis of R Cath    Pulmonary hypertension.  The various methods of treatment have been discussed with the patient and family. After consideration of risks, benefits and other options for treatment, the patient has consented to  Procedures: RIGHT HEART CATH (Right) as a surgical intervention.  The patient's history has been reviewed, patient examined, no change in status, stable for surgery.  I have reviewed the patient's chart and labs.  Questions were answered to the patient's satisfaction.     Lendell Gallick

## 2024-06-02 NOTE — Discharge Instructions (Signed)
Right Heart Cath, Care After This sheet gives you information about how to care for yourself after your procedure. Your health care provider may also give you more specific instructions. If you have problems or questions, contact your health care provider. What can I expect after the procedure? After the procedure, it is common to have: Bruising or mild discomfort in the area where the IV was inserted (insertion site). Follow these instructions at home: Eating and drinking  You may eat and drink after your procedure.  Drink a lot of fluids for the first several days after the procedure, as directed by your health care provider. This helps to wash (flush) the contrast out of your body. Examples of healthy fluids include water or low-calorie drinks. General instructions Check your IV insertion area and also your venous access site every day for signs of infection. Check for: Redness, swelling, or pain. Fluid or blood. Warmth. Pus or a bad smell. Take over-the-counter and prescription medicines only as told by your health care provider. Rest and return to your normal activities as told by your health care provider. Ask your health care provider what activities are safe for you. Do not drive for 24 hours if you were given a medicine to help you relax (sedative), or until your health care provider approves. Keep all follow-up visits as told by your health care provider. This is important. Contact a health care provider if: Your skin becomes itchy or you develop a rash or hives. You have a fever that does not get better with medicine. You feel nauseous. You vomit. You have redness, swelling, or pain around the insertion site. You have fluid or blood coming from the insertion site. Your insertion area feels warm to the touch. You have pus or a bad smell coming from the insertion site. Get help right away if: You have difficulty breathing or shortness of breath. You develop chest pain. You  faint. You feel very dizzy. These symptoms may represent a serious problem that is an emergency. Do not wait to see if the symptoms will go away. Get medical help right away. Call your local emergency services (911 in the [place] Do not drive yourself to the hospital. Summary After your procedure, it is common to have bruising or mild discomfort in the area where the IV was inserted. You should check your IV insertion area every day for signs of infection. Take over-the-counter and prescription medicines only as told by your health care provider. You should drink a lot of fluids for the first several days after the procedure to help flush the contrast from your body. This information is not intended to replace advice given to you by your health care provider. Make sure you discuss any questions you have with your health care provider. Document Released: 03/26/2013 Document Revised: 05/18/2017 Document Reviewed: 04/29/2016 Elsevier Patient Education  2020 [employer] 

## 2024-06-07 ENCOUNTER — Ambulatory Visit: Payer: Self-pay | Admitting: Cardiology

## 2024-06-09 ENCOUNTER — Telehealth: Payer: Self-pay

## 2024-06-09 DIAGNOSIS — I5032 Chronic diastolic (congestive) heart failure: Secondary | ICD-10-CM

## 2024-06-09 DIAGNOSIS — I272 Pulmonary hypertension, unspecified: Secondary | ICD-10-CM

## 2024-06-09 NOTE — Telephone Encounter (Signed)
 Spoke with the patient and advised on recommendations from Dr. Kennyth. Referral has been placed. Patient verbalized understanding.

## 2024-06-09 NOTE — Telephone Encounter (Signed)
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 Let's make sure she has referral to HF Clinic. Need her to be seen and optimized by HF team then have a clinic visit with me in ~3 months.   Let's see if we can get someone else in her spot for ablation.  Thanks, Josh ----- Message ----- From: Darron Deatrice LABOR, MD Sent: 06/02/2024  10:21 AM EST To: Jniyah Dantuono Chauvigne, RN; Fonda Kitty, MD  Consider referral to AHF.  I was told that Xarelto  would not be interrupted but according to the patient, she held Xarelto  for 2 days.

## 2024-06-18 ENCOUNTER — Telehealth: Payer: Self-pay | Admitting: Cardiology

## 2024-06-18 NOTE — Telephone Encounter (Signed)
 Call to patient, 2 identifiers used. Explained that per encounter 06/09/24 ablation was cancelled and patient was referred to heart failure clinic. Patient states her heart failure clinic appointment Is in 08/01/23, and she would like to get in sooner. She is asking if there are any cancellations or a wait list that she can possibly be seen sooner.

## 2024-06-18 NOTE — Telephone Encounter (Signed)
 Pt would like a c/b regarding Ablation. Pt states that she is due to have Ablation on 06/23/24 but is not able to see [facility] until 07/31/24. Pt would like to know if she needs to move forward with Ablation. Please advise

## 2024-06-20 ENCOUNTER — Telehealth (HOSPITAL_COMMUNITY): Payer: Self-pay | Admitting: Internal Medicine

## 2024-06-20 NOTE — Telephone Encounter (Signed)
 Called to confirm/remind patient of their appointment at [facility] on 06/20/2024.   Appointment:   [x] Confirmed  [] Left mess   [] No answer/No voice mail  [] VM Full/unable to leave message  [] Phone not in service  Patient reminded to bring all medications and/or complete list.  Confirmed patient has transportation. Gave directions, instructed to utilize valet parking.

## 2024-06-23 ENCOUNTER — Encounter (HOSPITAL_COMMUNITY): Payer: Self-pay

## 2024-06-23 ENCOUNTER — Ambulatory Visit (HOSPITAL_COMMUNITY): Admit: 2024-06-23 | Admitting: Cardiology

## 2024-06-23 ENCOUNTER — Ambulatory Visit (HOSPITAL_COMMUNITY)
Admission: RE | Admit: 2024-06-23 | Discharge: 2024-06-23 | Disposition: A | Discharge status: Home / Self Care | Source: Ambulatory Visit | Attending: Internal Medicine | Admitting: Internal Medicine

## 2024-06-23 ENCOUNTER — Encounter (HOSPITAL_COMMUNITY): Payer: Self-pay | Admitting: Internal Medicine

## 2024-06-23 VITALS — BP 130/70 | HR 69 | Wt 228.4 lb

## 2024-06-23 DIAGNOSIS — G4733 Obstructive sleep apnea (adult) (pediatric): Secondary | ICD-10-CM | POA: Insufficient documentation

## 2024-06-23 DIAGNOSIS — K219 Gastro-esophageal reflux disease without esophagitis: Secondary | ICD-10-CM | POA: Insufficient documentation

## 2024-06-23 DIAGNOSIS — E039 Hypothyroidism, unspecified: Secondary | ICD-10-CM | POA: Insufficient documentation

## 2024-06-23 DIAGNOSIS — I5032 Chronic diastolic (congestive) heart failure: Secondary | ICD-10-CM

## 2024-06-23 DIAGNOSIS — E785 Hyperlipidemia, unspecified: Secondary | ICD-10-CM | POA: Insufficient documentation

## 2024-06-23 DIAGNOSIS — I110 Hypertensive heart disease with heart failure: Secondary | ICD-10-CM | POA: Diagnosis not present

## 2024-06-23 DIAGNOSIS — G4730 Sleep apnea, unspecified: Secondary | ICD-10-CM | POA: Diagnosis not present

## 2024-06-23 DIAGNOSIS — Z8673 Personal history of transient ischemic attack (TIA), and cerebral infarction without residual deficits: Secondary | ICD-10-CM | POA: Insufficient documentation

## 2024-06-23 DIAGNOSIS — I4819 Other persistent atrial fibrillation: Secondary | ICD-10-CM | POA: Insufficient documentation

## 2024-06-23 DIAGNOSIS — Z7901 Long term (current) use of anticoagulants: Secondary | ICD-10-CM | POA: Diagnosis not present

## 2024-06-23 DIAGNOSIS — I2584 Coronary atherosclerosis due to calcified coronary lesion: Secondary | ICD-10-CM | POA: Insufficient documentation

## 2024-06-23 DIAGNOSIS — Z6839 Body mass index (BMI) 39.0-39.9, adult: Secondary | ICD-10-CM | POA: Insufficient documentation

## 2024-06-23 DIAGNOSIS — I2510 Atherosclerotic heart disease of native coronary artery without angina pectoris: Secondary | ICD-10-CM | POA: Insufficient documentation

## 2024-06-23 DIAGNOSIS — I2781 Cor pulmonale (chronic): Secondary | ICD-10-CM | POA: Diagnosis not present

## 2024-06-23 DIAGNOSIS — I272 Pulmonary hypertension, unspecified: Secondary | ICD-10-CM

## 2024-06-23 SURGERY — ATRIAL FIBRILLATION ABLATION
Anesthesia: General

## 2024-06-23 NOTE — Progress Notes (Signed)
 "  [facility]INIC CONSULT NOTE  Referring Physician: Cleatus Arlyss RAMAN, MD Primary Care: Cleatus Arlyss RAMAN, MD Primary Cardiologist: Evalene Lunger, MD  Chief Complaint: Pulmonary HTN  HPI:  Kara Wright is a 79 y/o woman with the following PMHx referred by Dr. Darron for further evaluation of her pulmonary HTN.  Morbid obesity  Body mass index is 39.2 kg/m. Coronary calcification. Cardiac PET/CT 04/03/2024: Normal, low risk study.  There is no evidence of ischemia or infarction.  Severe coronary calcifications present in the LAD, LCx, and RCA. PAF. 7-day ZIO 12/06/2023: HR 42 to 167 bpm, average 80 bpm.  A-fib continuous 100% burden.  6 patient triggered events associated with A-fib. DCCV 02/01/2024. Echo 02/05/2024: EF 60 to 65%.  Indeterminate diastolic parameters.  Moderately reduced RV function, moderate RVH.  With severely elevated PA pressure, RVSP 72 mmHg.  Severe RAE.  Mild to moderate TR.  Dilated IVC, RA pressure 15 mmHg. Hypertension. Hyperlipidemia. Lipid panel 10/15/2023: LDL 49, HDL 38, TG 148, total 116. TIA. OSA. CPAP with good adherence.  GERD. Hypothyroidism.   Has long h/o PAF. Admitted n May 2018 for acute hypoxemic respiratory failure secondary to pneumonia.  Echo EF 60-65%, normal diastolic parameters, mild LVH, mild MR, mild RVH.     Patient was seen in the office on 03/28/2023 for preop evaluation prior to colonoscopy.  She reported being under some stress at home.  It was recommended she start diltiazem  120 mg in the morning and continue propranolol  in the evening.  No other changes were made.    Patient was seen in the office on 11/16/2023 for routine follow-up.  7-day ZIO showed 100% A-fib burden.  She was scheduled for DCCV which she underwent on 02/01/2024   Echo on 02/05/2024 EF 60-65% RV mild HK RVSP 72  Patient was contacted and started on Lasix  20 mg daily.    RHC 12/25: RA 18 PA 67/25 (42) PCW 22 PVR 4.1 Fick 4.9/2.3 (pCO2 on ABG was 60)  Cardiac PET  stress which was a normal, low risk study.  She was started on flecainide  and scheduled for A-fib ablation in January 2026. AF ablation deferred due to [facility] diagnosis  Here with her sister and daughter. Gets SOB with mild exertion. Feels like it has gotten worse lately. Takes lasix  20 mg daily. Will skip 1-2 doses per week if she has to go out. Wears CPAP regularly - downloads look great. No h/o DVT, PE, CTD. Weighed 125 pounds when she graduated HS (now 228). Does not use oxygen .    Past Medical History:  Diagnosis Date   Atrial fib/flutter, transient (HCC)    Cataract 2019   left eye   Clotting disorder    on xarelto    Diverticulosis 03/1997   Dyspnea    Dysrhythmia    atrial fibrilation   GERD (gastroesophageal reflux disease) 1997   good on prilosec   Hepatitis    hx of hep B (late 1970s)   Hyperlipidemia 08/1996   Hypothyroidism 1984   Migraine    w/o aura   Osteopenia 11/2002   via dexa   Skin cancer    skin cancer   Sleep apnea    Stroke (HCC) 08/2011   TIA    Current Outpatient Medications  Medication Sig Dispense Refill   acetaminophen  (TYLENOL ) 500 MG tablet Take 500-1,000 mg by mouth every 8 (eight) hours as needed for mild pain (pain score 1-3).     alendronate  (FOSAMAX ) 70 MG tablet TAKE  1 TABLET BY MOUTH IN THE EARLY MORNING ON MONDAY, TAKE WITH A FULL GLASS OF WATER  ON AN EMPTY STOMACH 13 tablet 11   cetirizine (ZYRTEC) 10 MG tablet Take 10 mg by mouth daily as needed for allergies.     Cholecalciferol (VITAMIN D3) 50 MCG (2000 UT) capsule TAKE 2 CAPSULES BY MOUTH DAILY 180 capsule 1   diltiazem  (CARDIZEM  CD) 120 MG 24 hr capsule Take 1 capsule by mouth once daily 90 capsule 2   diltiazem  (CARDIZEM ) 30 MG tablet TAKE ONE TABLET BY MOUTH THREE TIMES DAILY AS NEEDED 90 tablet 1   diphenhydrAMINE (BENADRYL) 25 mg capsule Take 25 mg by mouth every 6 (six) hours as needed for allergies.     Dupilumab (DUPIXENT) 300 MG/2ML SOPN Inject 300 mg into the skin every 14  (fourteen) days.     flecainide  (TAMBOCOR ) 100 MG tablet Take 1 tablet (100 mg total) by mouth 2 (two) times daily. 180 tablet 3   furosemide  (LASIX ) 20 MG tablet Take 1 tablet by mouth once daily 90 tablet 3   levothyroxine  (SYNTHROID ) 150 MCG tablet TAKE 1 TABLET BY MOUTH ON MONDAY THROUGH SATURDAY AND TAKE 2 TABLETS ONCE WEEKLY ON SUNDAY 105 tablet 3   loperamide (IMODIUM A-D) 2 MG tablet Take 2 mg by mouth 2 (two) times daily as needed for diarrhea or loose stools.     omeprazole  (PRILOSEC) 20 MG capsule TAKE ONE (1) CAPSULE BY MOUTH DAILY 90 capsule 1   promethazine  (PHENERGAN ) 25 MG tablet TAKE ONE TABLET BY MOUTH EVERY 6 HOURS AS NEEDED FOR NAUSEA 30 tablet 3   propranolol  ER (INDERAL  LA) 120 MG 24 hr capsule TAKE ONE (1) CAPSULE BY MOUTH AT BEDTIME 90 capsule 1   rivaroxaban  (XARELTO ) 20 MG TABS tablet Take 1 tablet (20 mg total) by mouth daily with supper. 3 tablet 0   rosuvastatin  (CRESTOR ) 5 MG tablet Take 1 tablet (5 mg total) by mouth at bedtime. 90 tablet 3   traMADol  (ULTRAM ) 50 MG tablet TAKE 1 TABLET BY MOUTH EVERY 12 HOURS AS NEEDED 60 tablet 2   No current facility-administered medications for this encounter.    Allergies[1]    Social History   Socioeconomic History   Marital status: Widowed    Spouse name: Not on file   Number of children: 2   Years of education: Not on file   Highest education level: Not on file  Occupational History   Occupation: Chartered Certified Accountant: RETIRED    Comment: [employer]   Occupation: Building Control Surveyor: [employer]  Tobacco Use   Smoking status: Former    Current packs/day: 0.00    Average packs/day: 1.0 packs/day    Types: Cigarettes    Quit date: 06/26/1986    Years since quitting: 38.0    Passive exposure: Past   Smokeless tobacco: Never  Vaping Use   Vaping status: Never Used  Substance and Sexual Activity   Alcohol use: No   Drug use: No   Sexual activity: Not Currently  Other Topics  Concern   Not on file  Social History Narrative   Worked part time at 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     Married 1979---widowed 10/22   2 daughters from prev relationship   [employer] fan, [employer] fan.   Social Drivers of Health   Tobacco Use: Medium Risk (06/23/2024)   Patient History    Smoking Tobacco  Use: Former    Smokeless Tobacco Use: Never    Passive Exposure: Past  Programmer, Applications: Low Risk (08/06/2023)   Overall Financial Resource Strain (CARDIA)    Difficulty of Paying Living Expenses: Not hard at all  Food Insecurity: No Food Insecurity (08/06/2023)   Hunger Vital Sign    Worried About Running Out of Food in the Last Year: Never true    Ran Out of Food in the Last Year: Never true  Transportation Needs: No Transportation Needs (08/06/2023)   PRAPARE - Administrator, Civil Service (Medical): No    Lack of Transportation (Non-Medical): No  Physical Activity: Insufficiently Active (08/06/2023)   Exercise Vital Sign    Days of Exercise per Week: 3 days    Minutes of Exercise per Session: 30 min  Stress: No Stress Concern Present (08/06/2023)   [employer] of Occupational Health - Occupational Stress Questionnaire    Feeling of Stress : Only a little  Social Connections: Moderately Isolated (08/06/2023)   Social Connection and Isolation Panel    Frequency of Communication with Friends and Family: More than three times a week    Frequency of Social Gatherings with Friends and Family: More than three times a week    Attends Religious Services: More than 4 times per year    Active Member of [employer] or Organizations: No    Attends Banker Meetings: Never    Marital Status: Widowed  Intimate Partner Violence: Not At Risk (08/06/2023)   Humiliation, Afraid, Rape, and Kick questionnaire    Fear of Current or Ex-Partner: No    Emotionally Abused: No    Physically Abused: No    Sexually Abused: No  Depression (PHQ2-9): Low  Risk (10/22/2023)   Depression (PHQ2-9)    PHQ-2 Score: 1  Alcohol Screen: Low Risk (08/06/2023)   Alcohol Screen    Last Alcohol Screening Score (AUDIT): 0  Housing: Unknown (08/06/2023)   Housing Stability Vital Sign    Unable to Pay for Housing in the Last Year: No    Number of Times Moved in the Last Year: Not on file    Homeless in the Last Year: No  Utilities: Not At Risk (08/06/2023)   AHC Utilities    Threatened with loss of utilities: No  Health Literacy: Adequate Health Literacy (08/06/2023)   B1300 Health Literacy    Frequency of need for help with medical instructions: Never      Family History  Problem Relation Age of Onset   Hypertension Mother    Osteopenia Mother    Stroke Mother        deceased age 25   Colon cancer Father 60       mets   Allergies Sister    Breast cancer Cousin        pat cousin   Colon cancer Nephew    Rectal cancer Neg Hx     Vitals:   06/23/24 1432  BP: 130/70  Pulse: 69  SpO2: 93%  Weight: 103.6 kg (228 lb 6.4 oz)    PHYSICAL EXAM: General:  obese woman Sitting up in chair/. No resp difficulty HEENT: normal Neck: supple. no JVD.  Cor: Irregular rate & rhythm. No rubs, gallops or murmurs. Lungs: clear Abdomen:  obese soft, nontender, nondistended.Good bowel sounds. Extremities: no cyanosis, clubbing, rash, 1+ edema Neuro: alert & orientedx3, cranial nerves grossly intact. moves all 4 extremities w/o difficulty. Affect pleasant   ECG: AF with VR of 55  Personally reviewed  ASSESSMENT & PLAN:  Chronic HFpEF/pulmonary hypertension with cor pulmonale - Echo 02/05/2024 EF 60-65% RV mild HK RVSP 72  Patient was contacted and started on Lasix  20 mg daily.   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 Isaiah to discuss need for BIPAP - Check hall walk - Refer Pulmonary rehab - Refer to PharmD fr [id number] for weight loss - No role for selective pulmonary artery vasodilators  Persistent AF - 7-day ZIO June 2025 demonstrated 100% A-fib burden, HR 42 to 167 bpm  - She was pending A-fib ablation in January 2026 now on hold with PH   OSA - plan as above  Morbid obesity - Body mass index is 39.2 kg/m. - Refer PharmD for [id number]   Toribio Fuel, MD  3:01 PM      [1]  Allergies Allergen Reactions   Celebrex [Celecoxib]     REACTION: nausea: GI UPSET   Doxycycline  Other (See Comments)    GI upset- not an allergy .     Gold-Containing Drug Products Itching   Other     Fragrance, phenylenediamine, contrast metal agents, gold   Nickel Dermatitis and Rash   Simvastatin  Other (See Comments)    Muscle aches. Tolerates Crestor .    Sodium Metabisulfite Nausea Only, Dermatitis and Rash    Positive patch test   "

## 2024-06-23 NOTE — Progress Notes (Signed)
 Patient did hallway walk initial oxygen  sat was 95% on room air and ended with oxygen  sat of 92 % on room air

## 2024-06-23 NOTE — Patient Instructions (Signed)
 Medication Changes:  None, continue current medications   Referrals:  You have been referred to Pharmacy Clinic for weight loss medication, they will call you to discuss  You have been referred to Pulmonary Rehab at [facility], they will call you to schedule  Special Instructions // Education:  Do the following things EVERYDAY: Weigh yourself in the morning before breakfast. Write it down and keep it in a log. Take your medicines as prescribed Eat low salt foods--Limit salt (sodium) to 2000 mg per day.  Stay as active as you can everyday Limit all fluids for the day to less than 2 liters   Follow-Up in: 4 months (May 2026), **PLEASE CALL OUR OFFICE IN MARCH TO SCHEDULE THIS APPOINTMENT    At the [facility], you and your health needs are our priority. We have a designated team specialized in the treatment of Heart Failure. This Care Team includes your primary Heart Failure Specialized Cardiologist (physician), Advanced Practice Providers (APPs- Physician Assistants and Nurse Practitioners), and Pharmacist who all work together to provide you with the care you need, when you need it.   You may see any of the following providers on your designated Care Team at your next follow up:  Dr. Toribio Fuel Dr. Ezra Shuck Dr. Odis Brownie Greig Mosses, NP Caffie Shed, [place] [facility], [place] Beckey Coe, NP Jordan Lee, NP Tinnie Redman, PharmD   Please be sure to bring in all your medications bottles to every appointment.   Need to Contact Us :  If you have any questions or concerns before your next appointment please send us  a message through [place] or call our office at [phone].    TO LEAVE A MESSAGE FOR THE NURSE SELECT OPTION 2, PLEASE LEAVE A MESSAGE INCLUDING: YOUR NAME DATE OF BIRTH CALL BACK NUMBER REASON FOR CALL**this is important as we prioritize the call backs  YOU WILL RECEIVE A CALL BACK THE SAME DAY AS LONG AS YOU CALL  BEFORE 4:00 PM

## 2024-06-27 ENCOUNTER — Ambulatory Visit: Admitting: Family Medicine

## 2024-07-08 ENCOUNTER — Encounter

## 2024-07-10 ENCOUNTER — Ambulatory Visit: Admitting: Family Medicine

## 2024-07-10 VITALS — BP 118/82 | HR 71 | Temp 98.5°F | Ht 64.0 in | Wt 220.1 lb

## 2024-07-10 DIAGNOSIS — R059 Cough, unspecified: Secondary | ICD-10-CM | POA: Diagnosis not present

## 2024-07-10 DIAGNOSIS — D649 Anemia, unspecified: Secondary | ICD-10-CM | POA: Diagnosis not present

## 2024-07-10 MED ORDER — AMOXICILLIN-POT CLAVULANATE 875-125 MG PO TABS: 1.0000 | ORAL_TABLET | Freq: Two times a day (BID) | ORAL | Status: DC

## 2024-07-10 MED ORDER — AMOXICILLIN-POT CLAVULANATE 875-125 MG PO TABS: 1.0000 | ORAL_TABLET | Freq: Two times a day (BID) | ORAL | 0 refills | Status: AC

## 2024-07-10 NOTE — Patient Instructions (Addendum)
 Take mucinex  with plenty of fluid.  Start augmentin .  Update me as needed.  Take care.  Glad to see you.  See about getting labs collected in [facility] when feeling better.

## 2024-07-10 NOTE — Assessment & Plan Note (Signed)
 Okay for outpatient f/u  Take mucinex  with plenty of fluid.  Start augmentin .  Update me as needed.  She agrees with plan.

## 2024-07-10 NOTE — Assessment & Plan Note (Signed)
 Discussed deferring lab draw to day with goal to get labs done at [facility] facility, ie closer to home, when feeling well.  Colonoscopy 2024.

## 2024-07-10 NOTE — Progress Notes (Signed)
 Chest congestion.  Sick contact at home.  Cough, brown sputum.  No hemoptysis.  No fevers >100.  No chills.  Going on for about 1 week.    Cardiac w/u and path/phys d/w pt.    She cut back on candy and junk food and weight improved.    HGB lower but stable at 10.  No known bleeding.  No black or bloody stools.  Discussed deferring lab draw to day with goal to get labs done at [facility] facility, ie closer to home, when feeling well.  Colonoscopy 2024.   Meds, vitals, and allergies reviewed.   ROS: Per HPI unless specifically indicated in ROS section   Nad Ncat TM wnl Nasal exam stuffy OP wnl MMM Neck supple, no LA Rrr Ctab Abd soft, not ttp Skin well perfused.   30 minutes were devoted to patient care in this encounter (this includes time spent reviewing the patient's file/history, interviewing and examining the patient, counseling/reviewing plan with patient).

## 2024-07-15 ENCOUNTER — Ambulatory Visit: Payer: Self-pay | Admitting: Family Medicine

## 2024-07-15 NOTE — Telephone Encounter (Signed)
 Requesting cough suppressant. States tessalon  perles have not historically work well for her. Advised to call if symptoms worsen. All symptoms improved except cough. Preferred pharmacy below:  [id number] - [place], [place] - [address] [place] [address] [[id number]]   FYI Only or Action Required?: Action required by provider: clinical question for provider.  Patient was last seen in primary care on 07/10/2024 by Cleatus Arlyss RAMAN, MD.  Called Nurse Triage reporting Cough.  Symptoms began several weeks ago.  Interventions attempted: OTC medications: mucinex  and Prescription medications: augmentin .  Symptoms are: gradually improving.  Triage Disposition: Discuss With PCP and Callback by Nurse Today (overriding See Physician Within 24 Hours)  Patient/caregiver understands and will follow disposition?: Yes  Copied from CRM #[id number]. Topic: Clinical - Medical Advice >> Jul 15, 2024  2:23 PM Rea ORN wrote: Reason for CRM: Pt was seen last week 1/22 and reports that she still has the cough. She said it is not productive and feels like a constant tickle in her throat. Pt has tried honey, pineapple and other home remedies but nothing is helping. Pt is asking what she can more can she take.   Please call back to advise,  [phone]  Reason for Disposition  [1] Continuous (nonstop) coughing interferes with work or school AND [2] no improvement using cough treatment per Care Advice  Answer Assessment - Initial Assessment Questions Pt states has been taking Augmentin  BID since prescribed and mucinex . States improving, but cough remains continuous and dry. Pt c/o difficulty sleeping due to cough.   1. ONSET: When did the cough begin?      3 weeks 2. SEVERITY: How bad is the cough today?      Keeping her awake 3. SPUTUM: Describe the color of your sputum (e.g., none, dry cough; clear, white, yellow, green)     Unproductive 4. HEMOPTYSIS: Are you coughing up any blood? If Yes, ask: How much?  (e.g., flecks, streaks, tablespoons, etc.)     Denies 5. DIFFICULTY BREATHING: Are you having difficulty breathing? If Yes, ask: How bad is it? (e.g., mild, moderate, severe)      Denies 6. FEVER: Do you have a fever? If Yes, ask: What is your temperature, how was it measured, and when did it start?     Denies 10. OTHER SYMPTOMS: Do you have any other symptoms? (e.g., runny nose, wheezing, chest pain)       Denies 11. PREGNANCY: Is there any chance you are pregnant? When was your last menstrual period?  Protocols used: Cough - Acute Non-Productive-A-AH

## 2024-07-16 MED ORDER — HYDROCODONE BIT-HOMATROP MBR 5-1.5 MG/5ML PO SOLN: 5.0000 mL | Freq: Two times a day (BID) | ORAL | 0 refills | Status: AC | PRN

## 2024-07-16 NOTE — Telephone Encounter (Signed)
 Called patient reviewed all information and repeated back to me. Will call if any questions.  ? ?

## 2024-07-16 NOTE — Telephone Encounter (Signed)
 Sent hydrocodone  cough med.  Sedation cautions.  Don't take with tramadol .  Seek recheck if worse.  Thanks.

## 2024-07-16 NOTE — Addendum Note (Signed)
 Addended by: CLEATUS LORELI RAMAN on: 07/16/2024 02:09 PM   Modules accepted: Orders

## 2024-07-17 ENCOUNTER — Encounter: Attending: Internal Medicine

## 2024-07-17 ENCOUNTER — Other Ambulatory Visit: Payer: Self-pay

## 2024-07-17 VITALS — Ht 65.0 in | Wt 221.9 lb

## 2024-07-17 DIAGNOSIS — I272 Pulmonary hypertension, unspecified: Secondary | ICD-10-CM | POA: Diagnosis present

## 2024-07-17 NOTE — Patient Instructions (Signed)
 Patient Instructions  Patient Details  Name: Kara Wright MRN: [id number] Date of Birth: 03-04-1946 Referring Provider:  Cherrie Toribio SAUNDERS, MD  Below are your personal goals for exercise, nutrition, and risk factors. Our goal is to help you stay on track towards obtaining and maintaining these goals. We will be discussing your progress on these goals with you throughout the program.  Initial Exercise Prescription:  Initial Exercise Prescription - 07/17/24 1600       Date of Initial Exercise RX and Referring Provider   Date 07/17/24    Referring Provider Cherrie Toribio, MD      Oxygen    Maintain Oxygen  Saturation 88% or higher      Treadmill   MPH 1    Grade 0    Minutes 15    METs 1      Recumbant Bike   Level 1    RPM 50    Watts 15    Minutes 15    METs 1      NuStep   Level 1    SPM 80    Minutes 15    METs 1      T5 Nustep   Level 1    SPM 80    Minutes 15    METs 1      Biostep-RELP   Level 1    SPM 50    Minutes 15    METs 1      Track   Laps 14    Minutes 15    METs 1.76      Prescription Details   Frequency (times per week) 2    Duration Progress to 30 minutes of continuous aerobic without signs/symptoms of physical distress      Intensity   THRR 40-80% of Max Heartrate 92-126    Ratings of Perceived Exertion 11-13    Perceived Dyspnea 0-4      Progression   Progression Continue to progress workloads to maintain intensity without signs/symptoms of physical distress.      Resistance Training   Training Prescription Yes    Weight 2 lb    Reps 10-15          Exercise Goals: Frequency: Be able to perform aerobic exercise two to three times per week in program working toward 2-5 days per week of home exercise.  Intensity: Work with a perceived exertion of 11 (fairly light) - 15 (hard) while following your exercise prescription.  We will make changes to your prescription with you as you progress through the program.    Duration: Be able to do 30 to 45 minutes of continuous aerobic exercise in addition to a 5 minute warm-up and a 5 minute cool-down routine.   Nutrition Goals: Your personal nutrition goals will be established when you do your nutrition analysis with the dietician.  The following are general nutrition guidelines to follow: Cholesterol < 200mg /day Sodium < 1500mg /day Fiber: Women over 50 yrs - 21 grams per day  Personal Goals:  Personal Goals and Risk Factors at Admission - 07/17/24 1516       Core Components/Risk Factors/Patient Goals on Admission    Weight Management Yes;Weight Loss;Obesity    Intervention Weight Management: Develop a combined nutrition and exercise program designed to reach desired caloric intake, while maintaining appropriate intake of nutrient and fiber, sodium and fats, and appropriate energy expenditure required for the weight goal.;Weight Management: Provide education and appropriate resources to help participant work on and attain dietary goals.;Weight  Management/Obesity: Establish reasonable short term and long term weight goals.;Obesity: Provide education and appropriate resources to help participant work on and attain dietary goals.    Admit Weight 221 lb 14.4 oz (100.7 kg)    Goal Weight: Short Term 190 lb 8 oz (86.4 kg)    Goal Weight: Long Term 160 lb (72.6 kg)    Expected Outcomes Short Term: Continue to assess and modify interventions until short term weight is achieved;Long Term: Adherence to nutrition and physical activity/exercise program aimed toward attainment of established weight goal;Weight Loss: Understanding of general recommendations for a balanced deficit meal plan, which promotes 1-2 lb weight loss per week and includes a negative energy balance of [phone] kcal/d;Understanding recommendations for meals to include 15-35% energy as protein, 25-35% energy from fat, 35-60% energy from carbohydrates, less than 200mg  of dietary cholesterol, 20-35 gm of  total fiber daily;Understanding of distribution of calorie intake throughout the day with the consumption of 4-5 meals/snacks    Improve shortness of breath with ADL's Yes    Intervention Provide education, individualized exercise plan and daily activity instruction to help decrease symptoms of SOB with activities of daily living.    Expected Outcomes Short Term: Improve cardiorespiratory fitness to achieve a reduction of symptoms when performing ADLs;Long Term: Be able to perform more ADLs without symptoms or delay the onset of symptoms    Heart Failure --    Intervention --    Expected Outcomes --    Hypertension Yes    Intervention Provide education on lifestyle modifcations including regular physical activity/exercise, weight management, moderate sodium restriction and increased consumption of fresh fruit, vegetables, and low fat dairy, alcohol moderation, and smoking cessation.;Monitor prescription use compliance.    Expected Outcomes Short Term: Continued assessment and intervention until BP is < 140/56mm HG in hypertensive participants. < 130/97mm HG in hypertensive participants with diabetes, heart failure or chronic kidney disease.;Long Term: Maintenance of blood pressure at goal levels.    Lipids Yes    Intervention Provide education and support for participant on nutrition & aerobic/resistive exercise along with prescribed medications to achieve LDL 70mg , HDL >40mg .    Expected Outcomes Long Term: Cholesterol controlled with medications as prescribed, with individualized exercise RX and with personalized nutrition plan. Value goals: LDL < 70mg , HDL > 40 mg.;Short Term: Participant states understanding of desired cholesterol values and is compliant with medications prescribed. Participant is following exercise prescription and nutrition guidelines.    Stress Yes    Intervention Offer individual and/or small group education and counseling on adjustment to heart disease, stress management and  health-related lifestyle change. Teach and support self-help strategies.    Expected Outcomes Short Term: Participant demonstrates changes in health-related behavior, relaxation and other stress management skills, ability to obtain effective social support, and compliance with psychotropic medications if prescribed.;Long Term: Emotional wellbeing is indicated by absence of clinically significant psychosocial distress or social isolation.          Tobacco Use Initial Evaluation: Social History   Tobacco Use  Smoking Status Former   Current packs/day: 0.00   Average packs/day: 1.0 packs/day   Types: Cigarettes   Quit date: 06/26/1986   Years since quitting: 38.0   Passive exposure: Past  Smokeless Tobacco Never    Exercise Goals and Review:  Exercise Goals     Row Name 07/17/24 1653             Exercise Goals   Increase Physical Activity Yes  Intervention Provide advice, education, support and counseling about physical activity/exercise needs.;Develop an individualized exercise prescription for aerobic and resistive training based on initial evaluation findings, risk stratification, comorbidities and participant's personal goals.       Expected Outcomes Short Term: Attend rehab on a regular basis to increase amount of physical activity.;Long Term: Add in home exercise to make exercise part of routine and to increase amount of physical activity.;Long Term: Exercising regularly at least 3-5 days a week.       Increase Strength and Stamina Yes       Intervention Provide advice, education, support and counseling about physical activity/exercise needs.;Develop an individualized exercise prescription for aerobic and resistive training based on initial evaluation findings, risk stratification, comorbidities and participant's personal goals.       Expected Outcomes Short Term: Increase workloads from initial exercise prescription for resistance, speed, and METs.;Short Term: Perform  resistance training exercises routinely during rehab and add in resistance training at home;Long Term: Improve cardiorespiratory fitness, muscular endurance and strength as measured by increased METs and functional capacity ( )       Able to understand and use rate of perceived exertion (RPE) scale Yes       Intervention Provide education and explanation on how to use RPE scale       Expected Outcomes Short Term: Able to use RPE daily in rehab to express subjective intensity level;Long Term:  Able to use RPE to guide intensity level when exercising independently       Able to understand and use Dyspnea scale Yes       Intervention Provide education and explanation on how to use Dyspnea scale       Expected Outcomes Short Term: Able to use Dyspnea scale daily in rehab to express subjective sense of shortness of breath during exertion;Long Term: Able to use Dyspnea scale to guide intensity level when exercising independently       Knowledge and understanding of Target Heart Rate Range (THRR) Yes       Intervention Provide education and explanation of THRR including how the numbers were predicted and where they are located for reference       Expected Outcomes Short Term: Able to state/look up THRR;Short Term: Able to use daily as guideline for intensity in rehab;Long Term: Able to use THRR to govern intensity when exercising independently       Able to check pulse independently Yes       Intervention Provide education and demonstration on how to check pulse in carotid and radial arteries.;Review the importance of being able to check your own pulse for safety during independent exercise       Expected Outcomes Short Term: Able to explain why pulse checking is important during independent exercise;Long Term: Able to check pulse independently and accurately       Understanding of Exercise Prescription Yes       Intervention Provide education, explanation, and written materials on patient's individual  exercise prescription       Expected Outcomes Short Term: Able to explain program exercise prescription;Long Term: Able to explain home exercise prescription to exercise independently

## 2024-07-17 NOTE — Progress Notes (Signed)
 Pulmonary Individual Treatment Plan  Patient Details  Name: Kara Wright MRN: [id number] Date of Birth: 1946/03/13 Referring Provider:   Conrad Ports Pulmonary Rehab from 07/17/2024 in [facility] Cardiac and Pulmonary Rehab  Referring Provider Cherrie Sieving, MD    Initial Encounter Date:  Flowsheet Row Pulmonary Rehab from 07/17/2024 in [facility] Cardiac and Pulmonary Rehab  Date 07/17/24    Visit Diagnosis: Pulmonary hypertension (HCC)  Patient's Home Medications on Admission: Current Medications[1]  Past Medical History: Past Medical History:  Diagnosis Date   Atrial fib/flutter, transient (HCC)    Cataract 2019   left eye   Clotting disorder    on xarelto    Diverticulosis 03/1997   Dyspnea    Dysrhythmia    atrial fibrilation   GERD (gastroesophageal reflux disease) 1997   good on prilosec   Hepatitis    hx of hep B (late 1970s)   Hyperlipidemia 08/1996   Hypothyroidism 1984   Migraine    w/o aura   Osteopenia 11/2002   via dexa   Skin cancer    skin cancer   Sleep apnea    Stroke (HCC) 08/2011   TIA    Tobacco Use: Tobacco Use History[2]  Labs: Review Flowsheet  More data exists      Latest Ref Rng & Units 08/13/2019 09/13/2020 10/09/2022 10/15/2023 06/02/2024  Labs for ITP Cardiac and Pulmonary Rehab  Cholestrol 0 - 200 mg/dL 893  889  892  883  -  LDL (calc) 0 - 99 mg/dL 44  45  36  49  -  HDL-C >39.00 mg/dL 61.99  65.29  65.49  62.19  -  Trlycerides 0.0 - 149.0 mg/dL 880.9  845.9  818.9  851.9  -  Hemoglobin A1c 4.6 - 6.5 % 5.5  5.6  5.6  5.8  -  Bicarbonate 20.0 - 28.0 mmol/L - - - - 30.0  30.5   TCO2 22 - 32 mmol/L - - - - 32  32   O2 Saturation % - - - - 45  48     Details       Multiple values from one day are sorted in reverse-chronological order          Pulmonary Assessment Scores:  Pulmonary Assessment Scores     Row Name 07/17/24 1533         ADL UCSD   ADL Phase Entry     SOB Score total 58     Rest 0     Walk 3      Stairs 4     Bath 2     Dress 3     Shop 4       CAT Score   CAT Score 18       mMRC Score   mMRC Score 1        UCSD: Self-administered rating of dyspnea associated with activities of daily living (ADLs) 6-point scale (0 = not at all to 5 = maximal or unable to do because of breathlessness)  Scoring Scores range from 0 to 120.  Minimally important difference is 5 units  CAT: CAT can identify the health impairment of COPD patients and is better correlated with disease progression.  CAT has a scoring range of zero to 40. The CAT score is classified into four groups of low (less than 10), medium (10 - 20), high (21-30) and very high (31-40) based on the impact level of disease on health status. A CAT score over 10  suggests significant symptoms.  A worsening CAT score could be explained by an exacerbation, poor medication adherence, poor inhaler technique, or progression of COPD or comorbid conditions.  CAT MCID is 2 points  mMRC: mMRC (Modified Medical Research Council) Dyspnea Scale is used to assess the degree of baseline functional disability in patients of respiratory disease due to dyspnea. No minimal important difference is established. A decrease in score of 1 point or greater is considered a positive change.   Pulmonary Function Assessment:   Exercise Target Goals: Exercise Program Goal: Individual exercise prescription set using results from initial 6 min walk test and THRR while considering  patients activity barriers and safety.   Exercise Prescription Goal: Initial exercise prescription builds to 30-45 minutes a day of aerobic activity, 2-3 days per week.  Home exercise guidelines will be given to patient during program as part of exercise prescription that the participant will acknowledge.  Education: Aerobic Exercise: - Group verbal and visual presentation on the components of exercise prescription. Introduces F.I.T.T principle from ACSM for exercise  prescriptions.  Reviews F.I.T.T. principles of aerobic exercise including progression. Written material provided at class time.   Education: Resistance Exercise: - Group verbal and visual presentation on the components of exercise prescription. Introduces F.I.T.T principle from ACSM for exercise prescriptions  Reviews F.I.T.T. principles of resistance exercise including progression. Written material provided at class time.    Education: Exercise & Equipment Safety: - Individual verbal instruction and demonstration of equipment use and safety with use of the equipment. Flowsheet Row Pulmonary Rehab from 07/17/2024 in [facility] Cardiac and Pulmonary Rehab  Date 07/17/24  Educator MB  Instruction Review Code 1- Verbalizes Understanding    Education: Exercise Physiology & General Exercise Guidelines: - Group verbal and written instruction with models to review the exercise physiology of the cardiovascular system and associated critical values. Provides general exercise guidelines with specific guidelines to those with heart or lung disease.    Education: Flexibility, Balance, Mind/Body Relaxation: - Group verbal and visual presentation with interactive activity on the components of exercise prescription. Introduces F.I.T.T principle from ACSM for exercise prescriptions. Reviews F.I.T.T. principles of flexibility and balance exercise training including progression. Also discusses the mind body connection.  Reviews various relaxation techniques to help reduce and manage stress (i.e. Deep breathing, progressive muscle relaxation, and visualization). Balance handout provided to take home. Written material provided at class time.   Activity Barriers & Risk Stratification:  Activity Barriers & Cardiac Risk Stratification - 07/17/24 1514       Activity Barriers & Cardiac Risk Stratification   Activity Barriers Balance Concerns          6 Minute Walk:  6 Minute Walk     Row Name 07/17/24 1646          6 Minute Walk   Phase Initial     Distance 780 feet     Walk Time 5 minutes     # of Rest Breaks 1     MPH 1.77     METS 0.85     RPE 17     Perceived Dyspnea  1     VO2 Peak 2.98     Symptoms Yes (comment)     Comments Dizziness, SOB     Resting HR 67 bpm     Resting BP 104/70     Resting Oxygen  Saturation  92 %     Exercise Oxygen  Saturation  during 6 min walk 87 %     Max  Ex. HR 92 bpm     Max Ex. BP 112/60     2 Minute Post BP 108/70       Interval HR   1 Minute HR 81     2 Minute HR 87     3 Minute HR 79     4 Minute HR 77     5 Minute HR 74     6 Minute HR 92     2 Minute Post HR 59     Interval Heart Rate? Yes       Interval Oxygen    Interval Oxygen ? Yes     Baseline Oxygen  Saturation % 92 %     1 Minute Oxygen  Saturation % 91 %     1 Minute Liters of Oxygen  0 L     2 Minute Oxygen  Saturation % 87 %     2 Minute Liters of Oxygen  0 L     3 Minute Oxygen  Saturation % 93 %     3 Minute Liters of Oxygen  0 L     4 Minute Oxygen  Saturation % 94 %     4 Minute Liters of Oxygen  0 L     5 Minute Oxygen  Saturation % 90 %     5 Minute Liters of Oxygen  0 L     6 Minute Oxygen  Saturation % 88 %     6 Minute Liters of Oxygen  0 L     2 Minute Post Oxygen  Saturation % 93 %     2 Minute Post Liters of Oxygen  0 L       Oxygen  Initial Assessment:  Oxygen  Initial Assessment - 07/17/24 1527       Home Oxygen    Home Oxygen  Device None    Sleep Oxygen  Prescription CPAP    Liters per minute 0    Home Exercise Oxygen  Prescription None    Home Resting Oxygen  Prescription None    Compliance with Home Oxygen  Use Yes      Intervention   Short Term Goals To learn and exhibit compliance with exercise, home and travel O2 prescription;To learn and understand importance of monitoring SPO2 with pulse oximeter and demonstrate accurate use of the pulse oximeter.;To learn and understand importance of maintaining oxygen  saturations>88%;To learn and demonstrate proper pursed lip  breathing techniques or other breathing techniques.     Long  Term Goals Exhibits compliance with exercise, home  and travel O2 prescription;Maintenance of O2 saturations>88%;Verbalizes importance of monitoring SPO2 with pulse oximeter and return demonstration;Exhibits proper breathing techniques, such as pursed lip breathing or other method taught during program session          Oxygen  Re-Evaluation:   Oxygen  Discharge (Final Oxygen  Re-Evaluation):   Initial Exercise Prescription:  Initial Exercise Prescription - 07/17/24 1600       Date of Initial Exercise RX and Referring Provider   Date 07/17/24    Referring Provider Cherrie Sieving, MD      Oxygen    Maintain Oxygen  Saturation 88% or higher      Treadmill   MPH 1    Grade 0    Minutes 15    METs 1      Recumbant Bike   Level 1    RPM 50    Watts 15    Minutes 15    METs 1      NuStep   Level 1    SPM 80    Minutes 15    METs 1  T5 Nustep   Level 1    SPM 80    Minutes 15    METs 1      Biostep-RELP   Level 1    SPM 50    Minutes 15    METs 1      Track   Laps 14    Minutes 15    METs 1.76      Prescription Details   Frequency (times per week) 2    Duration Progress to 30 minutes of continuous aerobic without signs/symptoms of physical distress      Intensity   THRR 40-80% of Max Heartrate 92-126    Ratings of Perceived Exertion 11-13    Perceived Dyspnea 0-4      Progression   Progression Continue to progress workloads to maintain intensity without signs/symptoms of physical distress.      Resistance Training   Training Prescription Yes    Weight 2 lb    Reps 10-15          Perform Capillary Blood Glucose checks as needed.  Exercise Prescription Changes:   Exercise Prescription Changes     Row Name 07/17/24 1600             Response to Exercise   Blood Pressure (Admit) 104/70       Blood Pressure (Exercise) 112/60       Blood Pressure (Exit) 108/70       Heart  Rate (Admit) 67 bpm       Heart Rate (Exercise) 92 bpm       Heart Rate (Exit) 59 bpm       Oxygen  Saturation (Admit) 92 %       Oxygen  Saturation (Exercise) 87 %       Oxygen  Saturation (Exit) 93 %       Rating of Perceived Exertion (Exercise) 17       Perceived Dyspnea (Exercise) 1       Symptoms dizziness, SOB       Comments results          Exercise Comments:   Exercise Goals and Review:   Exercise Goals     Row Name 07/17/24 1653             Exercise Goals   Increase Physical Activity Yes       Intervention Provide advice, education, support and counseling about physical activity/exercise needs.;Develop an individualized exercise prescription for aerobic and resistive training based on initial evaluation findings, risk stratification, comorbidities and participant's personal goals.       Expected Outcomes Short Term: Attend rehab on a regular basis to increase amount of physical activity.;Long Term: Add in home exercise to make exercise part of routine and to increase amount of physical activity.;Long Term: Exercising regularly at least 3-5 days a week.       Increase Strength and Stamina Yes       Intervention Provide advice, education, support and counseling about physical activity/exercise needs.;Develop an individualized exercise prescription for aerobic and resistive training based on initial evaluation findings, risk stratification, comorbidities and participant's personal goals.       Expected Outcomes Short Term: Increase workloads from initial exercise prescription for resistance, speed, and METs.;Short Term: Perform resistance training exercises routinely during rehab and add in resistance training at home;Long Term: Improve cardiorespiratory fitness, muscular endurance and strength as measured by increased METs and functional capacity ( )       Able to understand and use rate of  perceived exertion (RPE) scale Yes       Intervention Provide education and  explanation on how to use RPE scale       Expected Outcomes Short Term: Able to use RPE daily in rehab to express subjective intensity level;Long Term:  Able to use RPE to guide intensity level when exercising independently       Able to understand and use Dyspnea scale Yes       Intervention Provide education and explanation on how to use Dyspnea scale       Expected Outcomes Short Term: Able to use Dyspnea scale daily in rehab to express subjective sense of shortness of breath during exertion;Long Term: Able to use Dyspnea scale to guide intensity level when exercising independently       Knowledge and understanding of Target Heart Rate Range (THRR) Yes       Intervention Provide education and explanation of THRR including how the numbers were predicted and where they are located for reference       Expected Outcomes Short Term: Able to state/look up THRR;Short Term: Able to use daily as guideline for intensity in rehab;Long Term: Able to use THRR to govern intensity when exercising independently       Able to check pulse independently Yes       Intervention Provide education and demonstration on how to check pulse in carotid and radial arteries.;Review the importance of being able to check your own pulse for safety during independent exercise       Expected Outcomes Short Term: Able to explain why pulse checking is important during independent exercise;Long Term: Able to check pulse independently and accurately       Understanding of Exercise Prescription Yes       Intervention Provide education, explanation, and written materials on patient's individual exercise prescription       Expected Outcomes Short Term: Able to explain program exercise prescription;Long Term: Able to explain home exercise prescription to exercise independently          Exercise Goals Re-Evaluation :   Discharge Exercise Prescription (Final Exercise Prescription Changes):  Exercise Prescription Changes - 07/17/24 1600        Response to Exercise   Blood Pressure (Admit) 104/70    Blood Pressure (Exercise) 112/60    Blood Pressure (Exit) 108/70    Heart Rate (Admit) 67 bpm    Heart Rate (Exercise) 92 bpm    Heart Rate (Exit) 59 bpm    Oxygen  Saturation (Admit) 92 %    Oxygen  Saturation (Exercise) 87 %    Oxygen  Saturation (Exit) 93 %    Rating of Perceived Exertion (Exercise) 17    Perceived Dyspnea (Exercise) 1    Symptoms dizziness, SOB    Comments results          Nutrition:  Target Goals: Understanding of nutrition guidelines, daily intake of sodium 1500mg , cholesterol 200mg , calories 30% from fat and 7% or less from saturated fats, daily to have 5 or more servings of fruits and vegetables.  Education: Nutrition 1 -Group instruction provided by verbal, written material, interactive activities, discussions, models, and posters to present general guidelines for heart healthy nutrition including macronutrients, label reading, and promoting whole foods over processed counterparts. Education serves as pensions consultant of discussion of heart healthy eating for all. Written material provided at class time.     Education: Nutrition 2 -Group instruction provided by verbal, written material, interactive activities, discussions, models, and posters to present general guidelines  for heart healthy nutrition including sodium, cholesterol, and saturated fat. Providing guidance of habit forming to improve blood pressure, cholesterol, and body weight. Written material provided at class time.     Biometrics:  Pre Biometrics - 07/17/24 1653       Pre Biometrics   Height 5' 5 (1.651 m)    Weight 221 lb 14.4 oz (100.7 kg)    Waist Circumference 47 inches    Hip Circumference 51 inches    Waist to Hip Ratio 0.92 %    BMI (Calculated) 36.93    Single Leg Stand 6 seconds           Nutrition Therapy Plan and Nutrition Goals:  Nutrition Therapy & Goals - 07/17/24 1654       Personal Nutrition  Goals   Nutrition Goal Will meet with RD on 2/9      Intervention Plan   Intervention Prescribe, educate and counsel regarding individualized specific dietary modifications aiming towards targeted core components such as weight, hypertension, lipid management, diabetes, heart failure and other comorbidities.;Nutrition handout(s) given to patient.    Expected Outcomes Short Term Goal: Understand basic principles of dietary content, such as calories, fat, sodium, cholesterol and nutrients.;Short Term Goal: A plan has been developed with personal nutrition goals set during dietitian appointment.;Long Term Goal: Adherence to prescribed nutrition plan.          Nutrition Assessments:  MEDIFICTS Score Key: >=70 Need to make dietary changes  40-70 Heart Healthy Diet <= 40 Therapeutic Level Cholesterol Diet  Flowsheet Row Pulmonary Rehab from 07/17/2024 in [facility] Cardiac and Pulmonary Rehab  Picture Your Plate Total Score on Admission 52   Picture Your Plate Scores: <59 Unhealthy dietary pattern with much room for improvement. 41-50 Dietary pattern unlikely to meet recommendations for good health and room for improvement. 51-60 More healthful dietary pattern, with some room for improvement.  >60 Healthy dietary pattern, although there may be some specific behaviors that could be improved.   Nutrition Goals Re-Evaluation:   Nutrition Goals Discharge (Final Nutrition Goals Re-Evaluation):   Psychosocial: Target Goals: Acknowledge presence or absence of significant depression and/or stress, maximize coping skills, provide positive support system. Participant is able to verbalize types and ability to use techniques and skills needed for reducing stress and depression.   Education: Stress, Anxiety, and Depression - Group verbal and visual presentation to define topics covered.  Reviews how body is impacted by stress, anxiety, and depression.  Also discusses healthy ways to reduce stress and to  treat/manage anxiety and depression.  Written material provided at class time.   Education: Sleep Hygiene -Provides group verbal and written instruction about how sleep can affect your health.  Define sleep hygiene, discuss sleep cycles and impact of sleep habits. Review good sleep hygiene tips.    Initial Review & Psychosocial Screening:  Initial Psych Review & Screening - 07/17/24 1521       Initial Review   Current issues with Current Stress Concerns    Source of Stress Concerns Family    Comments patient's daughter and two grandsons live with her which causes occasional increase in her stress level      Family Dynamics   Good Support System? Yes   sister and Dagmawi Venable friend     Barriers   Psychosocial barriers to participate in program The patient should benefit from training in stress management and relaxation.;There are no identifiable barriers or psychosocial needs.      Screening Interventions   Interventions Encouraged  to exercise;To provide support and resources with identified psychosocial needs;Provide feedback about the scores to participant    Expected Outcomes Short Term goal: Utilizing psychosocial counselor, staff and physician to assist with identification of specific Stressors or current issues interfering with healing process. Setting desired goal for each stressor or current issue identified.;Long Term Goal: Stressors or current issues are controlled or eliminated.;Short Term goal: Identification and review with participant of any Quality of Life or Depression concerns found by scoring the questionnaire.;Long Term goal: The participant improves quality of Life and PHQ9 Scores as seen by post scores and/or verbalization of changes          Quality of Life Scores:  Scores of 19 and below usually indicate a poorer quality of life in these areas.  A difference of  2-3 points is a clinically meaningful difference.  A difference of 2-3 points in the total score of the  Quality of Life Index has been associated with significant improvement in overall quality of life, self-image, physical symptoms, and general health in studies assessing change in quality of life.  PHQ-9: Review Flowsheet  More data exists      07/17/2024 07/10/2024 10/22/2023 08/06/2023 12/28/2022  Depression screen PHQ 2/9  Decreased Interest 0 0 0 0 0  Down, Depressed, Hopeless 0 0 1 0 0  PHQ - 2 Score 0 0 1 0 0  Altered sleeping 0 0 - - 0  Tired, decreased energy 2 3 - - 0  Change in appetite 0 0 - - 0  Feeling bad or failure about yourself  0 0 - - 0  Trouble concentrating 0 0 - - 0  Moving slowly or fidgety/restless 0 0 - - 0  Suicidal thoughts 0 0 - - 0  PHQ-9 Score 2 3 - - 0   Difficult doing work/chores Somewhat difficult Somewhat difficult - - Not difficult at all    Details       Data saved with a previous flowsheet row definition        Interpretation of Total Score  Total Score Depression Severity:  1-4 = Minimal depression, 5-9 = Mild depression, 10-14 = Moderate depression, 15-19 = Moderately severe depression, 20-27 = Severe depression   Psychosocial Evaluation and Intervention:  Psychosocial Evaluation - 07/17/24 1523       Psychosocial Evaluation & Interventions   Interventions Encouraged to exercise with the program and follow exercise prescription;Relaxation education;Stress management education    Comments Mrs. Wiesman will be attending pulmonary rehab for her diagonsis of pulmonary hypertension. She currently lives at home and states her daughter and two grandsons live with her. 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 She does report a good support system that includes her sister and Oreste Majeed friend. Chastin states that she enjoys watching TV to decompress. She states that she sleeps well and that she is compliant with her CPAP. Patient has recently  started to decrease sugar intake and has lost 10 lbs over the last month. She states that her cardiologist has set a goal of 60 lb weight loss for her optimal heart health.    Expected Outcomes Short: Attend pulmonary rehab for exercise and education. Long: Develop and maintain positive self care habits.    Continue Psychosocial Services  Follow up required by staff          Psychosocial Re-Evaluation:   Psychosocial Discharge (Final  Psychosocial Re-Evaluation):   Education: Education Goals: Education classes will be provided on a weekly basis, covering required topics. Participant will state understanding/return demonstration of topics presented.  Learning Barriers/Preferences:  Learning Barriers/Preferences - 07/17/24 1520       Learning Barriers/Preferences   Learning Barriers None    Learning Preferences None          General Pulmonary Education Topics:  Infection Prevention: - Provides verbal and written material to individual with discussion of infection control including proper hand washing and proper equipment cleaning during exercise session. Flowsheet Row Pulmonary Rehab from 07/17/2024 in [facility] Cardiac and Pulmonary Rehab  Date 07/17/24  Educator [facility]  Instruction Review Code 1- Verbalizes Understanding    Falls Prevention: - Provides verbal and written material to individual with discussion of falls prevention and safety. Flowsheet Row Pulmonary Rehab from 07/17/2024 in [facility] Cardiac and Pulmonary Rehab  Date 07/17/24  Educator [facility]  Instruction Review Code 1- Verbalizes Understanding    Chronic Lung Disease Review: - Group verbal instruction with posters, models, PowerPoint presentations and videos,  to review new updates, new respiratory medications, new advancements in procedures and treatments. Providing information on websites and 800 numbers for continued self-education. Includes information about supplement oxygen , available portable oxygen  systems,  continuous and intermittent flow rates, oxygen  safety, concentrators, and Medicare reimbursement for oxygen . Explanation of Pulmonary Drugs, including class, frequency, complications, importance of spacers, rinsing mouth after steroid MDI's, and proper cleaning methods for nebulizers. Review of basic lung anatomy and physiology related to function, structure, and complications of lung disease. Review of risk factors. Discussion about methods for diagnosing sleep apnea and types of masks and machines for OSA. Includes a review of the use of types of environmental controls: home humidity, furnaces, filters, dust mite/pet prevention, HEPA vacuums. Discussion about weather changes, air quality and the benefits of nasal washing. Instruction on Warning signs, infection symptoms, calling MD promptly, preventive modes, and value of vaccinations. Review of effective airway clearance, coughing and/or vibration techniques. Emphasizing that all should Create an Action Plan. Written material provided at class time.   AED/CPR: - Group verbal and written instruction with the use of models to demonstrate the basic use of the AED with the basic ABC's of resuscitation.    Tests and Procedures:  - Group verbal and visual presentation and models provide information about basic cardiac anatomy and function. Reviews the testing methods done to diagnose heart disease and the outcomes of the test results. Describes the treatment choices: Medical Management, Angioplasty, or Coronary Bypass Surgery for treating various heart conditions including Myocardial Infarction, Angina, Valve Disease, and Cardiac Arrhythmias.  Written material provided at class time.   Medication Safety: - Group verbal and visual instruction to review commonly prescribed medications for heart and lung disease. Reviews the medication, class of the drug, and side effects. Includes the steps to properly store meds and maintain the prescription regimen.   Written material given at graduation.   Other: -Provides group and verbal instruction on various topics (see comments)   Knowledge Questionnaire Score:  Knowledge Questionnaire Score - 07/17/24 1552       Knowledge Questionnaire Score   Pre Score 10/18           Core Components/Risk Factors/Patient Goals at Admission:  Personal Goals and Risk Factors at Admission - 07/17/24 1516       Core Components/Risk Factors/Patient Goals on Admission    Weight Management Yes;Weight Loss;Obesity    Intervention Weight Management: Develop a combined nutrition  and exercise program designed to reach desired caloric intake, while maintaining appropriate intake of nutrient and fiber, sodium and fats, and appropriate energy expenditure required for the weight goal.;Weight Management: Provide education and appropriate resources to help participant work on and attain dietary goals.;Weight Management/Obesity: Establish reasonable short term and long term weight goals.;Obesity: Provide education and appropriate resources to help participant work on and attain dietary goals.    Admit Weight 221 lb 14.4 oz (100.7 kg)    Goal Weight: Short Term 190 lb 8 oz (86.4 kg)    Goal Weight: Long Term 160 lb (72.6 kg)    Expected Outcomes Short Term: Continue to assess and modify interventions until short term weight is achieved;Long Term: Adherence to nutrition and physical activity/exercise program aimed toward attainment of established weight goal;Weight Loss: Understanding of general recommendations for a balanced deficit meal plan, which promotes 1-2 lb weight loss per week and includes a negative energy balance of [phone] kcal/d;Understanding recommendations for meals to include 15-35% energy as protein, 25-35% energy from fat, 35-60% energy from carbohydrates, less than 200mg  of dietary cholesterol, 20-35 gm of total fiber daily;Understanding of distribution of calorie intake throughout the day with the consumption  of 4-5 meals/snacks    Improve shortness of breath with ADL's Yes    Intervention Provide education, individualized exercise plan and daily activity instruction to help decrease symptoms of SOB with activities of daily living.    Expected Outcomes Short Term: Improve cardiorespiratory fitness to achieve a reduction of symptoms when performing ADLs;Long Term: Be able to perform more ADLs without symptoms or delay the onset of symptoms    Heart Failure --    Intervention --    Expected Outcomes --    Hypertension Yes    Intervention Provide education on lifestyle modifcations including regular physical activity/exercise, weight management, moderate sodium restriction and increased consumption of fresh fruit, vegetables, and low fat dairy, alcohol moderation, and smoking cessation.;Monitor prescription use compliance.    Expected Outcomes Short Term: Continued assessment and intervention until BP is < 140/18mm HG in hypertensive participants. < 130/65mm HG in hypertensive participants with diabetes, heart failure or chronic kidney disease.;Long Term: Maintenance of blood pressure at goal levels.    Lipids Yes    Intervention Provide education and support for participant on nutrition & aerobic/resistive exercise along with prescribed medications to achieve LDL 70mg , HDL >40mg .    Expected Outcomes Long Term: Cholesterol controlled with medications as prescribed, with individualized exercise RX and with personalized nutrition plan. Value goals: LDL < 70mg , HDL > 40 mg.;Short Term: Participant states understanding of desired cholesterol values and is compliant with medications prescribed. Participant is following exercise prescription and nutrition guidelines.    Stress Yes    Intervention Offer individual and/or small group education and counseling on adjustment to heart disease, stress management and health-related lifestyle change. Teach and support self-help strategies.    Expected Outcomes Short Term:  Participant demonstrates changes in health-related behavior, relaxation and other stress management skills, ability to obtain effective social support, and compliance with psychotropic medications if prescribed.;Long Term: Emotional wellbeing is indicated by absence of clinically significant psychosocial distress or social isolation.          Education:Diabetes - Individual verbal and written instruction to review signs/symptoms of diabetes, desired ranges of glucose level fasting, after meals and with exercise. Acknowledge that pre and post exercise glucose checks will be done for 3 sessions at entry of program.   Know Your Numbers and Heart Failure: -  Group verbal and visual instruction to discuss disease risk factors for cardiac and pulmonary disease and treatment options.  Reviews associated critical values for Overweight/Obesity, Hypertension, Cholesterol, and Diabetes.  Discusses basics of heart failure: signs/symptoms and treatments.  Introduces Heart Failure Zone chart for action plan for heart failure. Written material provided at class time.   Core Components/Risk Factors/Patient Goals Review:    Core Components/Risk Factors/Patient Goals at Discharge (Final Review):    ITP Comments:  ITP Comments     Row Name 07/17/24 1438           ITP Comments Completed program orientation and . Initial ITP created and sent for review to Medical Director.          Comments: Initial ITP     [1]  Current Outpatient Medications:    acetaminophen  (TYLENOL ) 500 MG tablet, Take 500-1,000 mg by mouth every 8 (eight) hours as needed for mild pain (pain score 1-3)., Disp: , Rfl:    alendronate  (FOSAMAX ) 70 MG tablet, TAKE 1 TABLET BY MOUTH IN THE EARLY MORNING ON MONDAY, TAKE WITH A FULL GLASS OF WATER  ON AN EMPTY STOMACH, Disp: 13 tablet, Rfl: 11   cetirizine (ZYRTEC) 10 MG tablet, Take 10 mg by mouth daily as needed for allergies., Disp: , Rfl:    Cholecalciferol (VITAMIN D3) 50 MCG  (2000 UT) capsule, TAKE 2 CAPSULES BY MOUTH DAILY, Disp: 180 capsule, Rfl: 1   diltiazem  (CARDIZEM  CD) 120 MG 24 hr capsule, Take 1 capsule by mouth once daily, Disp: 90 capsule, Rfl: 2   diltiazem  (CARDIZEM ) 30 MG tablet, TAKE ONE TABLET BY MOUTH THREE TIMES DAILY AS NEEDED (Patient taking differently: Take 30 mg by mouth every 8 (eight) hours as needed.), Disp: 90 tablet, Rfl: 1   diphenhydrAMINE (BENADRYL) 25 mg capsule, Take 25 mg by mouth every 6 (six) hours as needed for allergies., Disp: , Rfl:    Dupilumab (DUPIXENT) 300 MG/2ML SOPN, Inject 300 mg into the skin every 14 (fourteen) days., Disp: , Rfl:    flecainide  (TAMBOCOR ) 100 MG tablet, Take 1 tablet (100 mg total) by mouth 2 (two) times daily., Disp: 180 tablet, Rfl: 3   furosemide  (LASIX ) 20 MG tablet, Take 1 tablet by mouth once daily, Disp: 90 tablet, Rfl: 3   HYDROcodone  bit-homatropine (HYCODAN) 5-1.5 MG/5ML syrup, Take 5 mLs by mouth 2 (two) times daily as needed for cough (sedation caution.)., Disp: 50 mL, Rfl: 0   levothyroxine  (SYNTHROID ) 150 MCG tablet, TAKE 1 TABLET BY MOUTH ON MONDAY THROUGH SATURDAY AND TAKE 2 TABLETS ONCE WEEKLY ON SUNDAY, Disp: 105 tablet, Rfl: 3   loperamide (IMODIUM A-D) 2 MG tablet, Take 2 mg by mouth 2 (two) times daily as needed for diarrhea or loose stools., Disp: , Rfl:    omeprazole  (PRILOSEC) 20 MG capsule, TAKE ONE (1) CAPSULE BY MOUTH DAILY, Disp: 90 capsule, Rfl: 1   promethazine  (PHENERGAN ) 25 MG tablet, TAKE ONE TABLET BY MOUTH EVERY 6 HOURS AS NEEDED FOR NAUSEA, Disp: 30 tablet, Rfl: 3   propranolol  ER (INDERAL  LA) 120 MG 24 hr capsule, TAKE ONE (1) CAPSULE BY MOUTH AT BEDTIME, Disp: 90 capsule, Rfl: 1   rivaroxaban  (XARELTO ) 20 MG TABS tablet, Take 1 tablet (20 mg total) by mouth daily with supper., Disp: 3 tablet, Rfl: 0   rosuvastatin  (CRESTOR ) 5 MG tablet, Take 1 tablet (5 mg total) by mouth at bedtime., Disp: 90 tablet, Rfl: 3   traMADol  (ULTRAM ) 50 MG tablet, TAKE 1 TABLET BY MOUTH  EVERY  12 HOURS AS NEEDED, Disp: 60 tablet, Rfl: 2   amoxicillin -clavulanate (AUGMENTIN ) 875-125 MG tablet, Take 1 tablet by mouth 2 (two) times daily. (Patient not taking: Reported on 07/17/2024), Disp: 20 tablet, Rfl: 0 [2]  Social History Tobacco Use  Smoking Status Former   Current packs/day: 0.00   Average packs/day: 1.0 packs/day   Types: Cigarettes   Quit date: 06/26/1986   Years since quitting: 38.0   Passive exposure: Past  Smokeless Tobacco Never

## 2024-07-17 NOTE — Progress Notes (Signed)
 Completed program orientation and . Initial ITP created and sent for review to Medical Director.

## 2024-07-24 ENCOUNTER — Encounter

## 2024-07-28 ENCOUNTER — Encounter

## 2024-07-31 ENCOUNTER — Encounter

## 2024-07-31 ENCOUNTER — Ambulatory Visit: Admitting: Internal Medicine

## 2024-08-04 ENCOUNTER — Encounter

## 2024-08-06 ENCOUNTER — Ambulatory Visit: Payer: PPO

## 2024-08-07 ENCOUNTER — Encounter

## 2024-08-11 ENCOUNTER — Encounter

## 2024-08-11 ENCOUNTER — Ambulatory Visit: Payer: PPO

## 2024-08-14 ENCOUNTER — Encounter

## 2024-08-18 ENCOUNTER — Encounter

## 2024-08-21 ENCOUNTER — Encounter

## 2024-08-25 ENCOUNTER — Encounter

## 2024-08-28 ENCOUNTER — Encounter

## 2024-09-01 ENCOUNTER — Encounter

## 2024-09-04 ENCOUNTER — Encounter

## 2024-09-08 ENCOUNTER — Encounter

## 2024-09-11 ENCOUNTER — Encounter

## 2024-09-15 ENCOUNTER — Encounter

## 2024-09-18 ENCOUNTER — Encounter

## 2024-09-22 ENCOUNTER — Encounter

## 2024-09-25 ENCOUNTER — Encounter

## 2024-09-29 ENCOUNTER — Encounter

## 2024-10-02 ENCOUNTER — Encounter

## 2024-10-06 ENCOUNTER — Encounter

## 2024-10-09 ENCOUNTER — Encounter

## 2024-10-13 ENCOUNTER — Encounter

## 2024-10-16 ENCOUNTER — Encounter

## 2024-10-20 ENCOUNTER — Encounter

## 2024-10-23 ENCOUNTER — Encounter

## 2024-10-27 ENCOUNTER — Encounter

## 2024-10-30 ENCOUNTER — Encounter

## 2024-11-03 ENCOUNTER — Encounter

## 2024-11-06 ENCOUNTER — Encounter

## 2024-11-13 ENCOUNTER — Encounter
